# Patient Record
Sex: Female | Born: 1935 | Race: White | Hispanic: No | Smoking: Former smoker
Health system: Southern US, Community
[De-identification: ages and names within clinical notes are randomized; demographics above are authoritative.]

## PROBLEM LIST (undated history)

## (undated) DIAGNOSIS — S065X9A Traumatic subdural hemorrhage with loss of consciousness of unspecified duration, initial encounter: Secondary | ICD-10-CM

## (undated) DIAGNOSIS — R51 Headache: Secondary | ICD-10-CM

## (undated) DIAGNOSIS — R519 Headache, unspecified: Secondary | ICD-10-CM

## (undated) DIAGNOSIS — H269 Unspecified cataract: Secondary | ICD-10-CM

## (undated) DIAGNOSIS — M1990 Unspecified osteoarthritis, unspecified site: Secondary | ICD-10-CM

## (undated) DIAGNOSIS — M5030 Other cervical disc degeneration, unspecified cervical region: Secondary | ICD-10-CM

## (undated) DIAGNOSIS — E785 Hyperlipidemia, unspecified: Secondary | ICD-10-CM

## (undated) DIAGNOSIS — R296 Repeated falls: Secondary | ICD-10-CM

## (undated) DIAGNOSIS — G629 Polyneuropathy, unspecified: Secondary | ICD-10-CM

## (undated) DIAGNOSIS — S065XAA Traumatic subdural hemorrhage with loss of consciousness status unknown, initial encounter: Secondary | ICD-10-CM

## (undated) DIAGNOSIS — I4891 Unspecified atrial fibrillation: Secondary | ICD-10-CM

## (undated) DIAGNOSIS — F329 Major depressive disorder, single episode, unspecified: Secondary | ICD-10-CM

## (undated) DIAGNOSIS — M810 Age-related osteoporosis without current pathological fracture: Secondary | ICD-10-CM

## (undated) DIAGNOSIS — M479 Spondylosis, unspecified: Secondary | ICD-10-CM

## (undated) DIAGNOSIS — K219 Gastro-esophageal reflux disease without esophagitis: Secondary | ICD-10-CM

## (undated) DIAGNOSIS — I10 Essential (primary) hypertension: Secondary | ICD-10-CM

## (undated) DIAGNOSIS — F32A Depression, unspecified: Secondary | ICD-10-CM

## (undated) DIAGNOSIS — K5790 Diverticulosis of intestine, part unspecified, without perforation or abscess without bleeding: Secondary | ICD-10-CM

## (undated) DIAGNOSIS — H409 Unspecified glaucoma: Secondary | ICD-10-CM

## (undated) DIAGNOSIS — W19XXXA Unspecified fall, initial encounter: Secondary | ICD-10-CM

## (undated) HISTORY — DX: Unspecified atrial fibrillation: I48.91

## (undated) HISTORY — DX: Depression, unspecified: F32.A

## (undated) HISTORY — DX: Age-related osteoporosis without current pathological fracture: M81.0

## (undated) HISTORY — DX: Other cervical disc degeneration, unspecified cervical region: M50.30

## (undated) HISTORY — DX: Major depressive disorder, single episode, unspecified: F32.9

## (undated) HISTORY — DX: Unspecified cataract: H26.9

## (undated) HISTORY — PX: APPENDECTOMY: SHX54

## (undated) HISTORY — DX: Unspecified glaucoma: H40.9

## (undated) HISTORY — DX: Diverticulosis of intestine, part unspecified, without perforation or abscess without bleeding: K57.90

## (undated) HISTORY — DX: Spondylosis, unspecified: M47.9

## (undated) HISTORY — DX: Hyperlipidemia, unspecified: E78.5

## (undated) HISTORY — PX: VAGINAL HYSTERECTOMY: SHX2639

## (undated) HISTORY — DX: Unspecified fall, initial encounter: W19.XXXA

## (undated) HISTORY — PX: BURR HOLE FOR SUBDURAL HEMATOMA: SHX1275

## (undated) HISTORY — DX: Unspecified osteoarthritis, unspecified site: M19.90

## (undated) HISTORY — DX: Headache: R51

## (undated) HISTORY — DX: Repeated falls: R29.6

## (undated) HISTORY — DX: Headache, unspecified: R51.9

## (undated) HISTORY — DX: Gastro-esophageal reflux disease without esophagitis: K21.9

## (undated) HISTORY — PX: KNEE SURGERY: SHX244

## (undated) HISTORY — PX: COLONOSCOPY: SHX174

---

## 1998-07-01 ENCOUNTER — Other Ambulatory Visit: Admission: RE | Admit: 1998-07-01 | Discharge: 1998-07-01 | Payer: Self-pay | Admitting: Obstetrics and Gynecology

## 2000-04-05 ENCOUNTER — Inpatient Hospital Stay (HOSPITAL_COMMUNITY): Admission: EM | Admit: 2000-04-05 | Discharge: 2000-04-06 | Payer: Self-pay | Admitting: *Deleted

## 2000-04-05 ENCOUNTER — Encounter: Payer: Self-pay | Admitting: *Deleted

## 2003-05-23 ENCOUNTER — Encounter: Payer: Self-pay | Admitting: Internal Medicine

## 2003-07-11 ENCOUNTER — Encounter: Payer: Self-pay | Admitting: Internal Medicine

## 2005-05-04 ENCOUNTER — Inpatient Hospital Stay (HOSPITAL_COMMUNITY): Admission: RE | Admit: 2005-05-04 | Discharge: 2005-05-07 | Payer: Self-pay | Admitting: Orthopedic Surgery

## 2005-09-15 ENCOUNTER — Ambulatory Visit (HOSPITAL_COMMUNITY): Admission: RE | Admit: 2005-09-15 | Discharge: 2005-09-15 | Payer: Self-pay | Admitting: Neurosurgery

## 2005-11-09 ENCOUNTER — Encounter: Admission: RE | Admit: 2005-11-09 | Discharge: 2005-11-09 | Payer: Self-pay | Admitting: Neurosurgery

## 2005-12-03 ENCOUNTER — Encounter: Admission: RE | Admit: 2005-12-03 | Discharge: 2005-12-03 | Payer: Self-pay | Admitting: Neurosurgery

## 2006-02-25 ENCOUNTER — Encounter: Admission: RE | Admit: 2006-02-25 | Discharge: 2006-02-25 | Payer: Self-pay | Admitting: Neurosurgery

## 2006-05-31 ENCOUNTER — Encounter: Admission: RE | Admit: 2006-05-31 | Discharge: 2006-05-31 | Payer: Self-pay | Admitting: Neurosurgery

## 2006-06-21 ENCOUNTER — Inpatient Hospital Stay (HOSPITAL_COMMUNITY): Admission: RE | Admit: 2006-06-21 | Discharge: 2006-06-24 | Payer: Self-pay | Admitting: Orthopedic Surgery

## 2006-08-27 ENCOUNTER — Encounter: Admission: RE | Admit: 2006-08-27 | Discharge: 2006-08-27 | Payer: Self-pay | Admitting: Neurosurgery

## 2006-11-25 ENCOUNTER — Encounter: Admission: RE | Admit: 2006-11-25 | Discharge: 2006-11-25 | Payer: Self-pay | Admitting: Neurosurgery

## 2007-03-03 ENCOUNTER — Encounter: Admission: RE | Admit: 2007-03-03 | Discharge: 2007-03-03 | Payer: Self-pay | Admitting: Neurosurgery

## 2007-04-21 ENCOUNTER — Encounter: Admission: RE | Admit: 2007-04-21 | Discharge: 2007-04-21 | Payer: Self-pay | Admitting: Neurosurgery

## 2007-06-24 ENCOUNTER — Encounter: Admission: RE | Admit: 2007-06-24 | Discharge: 2007-06-24 | Payer: Self-pay | Admitting: Neurosurgery

## 2007-12-06 ENCOUNTER — Encounter: Admission: RE | Admit: 2007-12-06 | Discharge: 2007-12-06 | Payer: Self-pay | Admitting: Neurosurgery

## 2008-01-09 ENCOUNTER — Encounter: Admission: RE | Admit: 2008-01-09 | Discharge: 2008-01-09 | Payer: Self-pay | Admitting: Neurosurgery

## 2008-01-14 ENCOUNTER — Inpatient Hospital Stay (HOSPITAL_COMMUNITY): Admission: RE | Admit: 2008-01-14 | Discharge: 2008-01-18 | Payer: Self-pay | Admitting: Neurosurgery

## 2008-01-20 HISTORY — PX: BACK SURGERY: SHX140

## 2008-01-25 ENCOUNTER — Encounter: Admission: RE | Admit: 2008-01-25 | Discharge: 2008-01-25 | Payer: Self-pay | Admitting: Neurosurgery

## 2008-03-09 ENCOUNTER — Encounter: Admission: RE | Admit: 2008-03-09 | Discharge: 2008-03-09 | Payer: Self-pay | Admitting: Neurosurgery

## 2008-03-26 ENCOUNTER — Encounter: Admission: RE | Admit: 2008-03-26 | Discharge: 2008-03-26 | Payer: Self-pay | Admitting: Neurosurgery

## 2008-05-01 ENCOUNTER — Encounter: Admission: RE | Admit: 2008-05-01 | Discharge: 2008-05-01 | Payer: Self-pay | Admitting: Neurosurgery

## 2008-06-06 ENCOUNTER — Encounter: Admission: RE | Admit: 2008-06-06 | Discharge: 2008-06-06 | Payer: Self-pay | Admitting: Neurosurgery

## 2008-06-15 ENCOUNTER — Inpatient Hospital Stay (HOSPITAL_COMMUNITY): Admission: RE | Admit: 2008-06-15 | Discharge: 2008-06-17 | Payer: Self-pay | Admitting: Neurosurgery

## 2009-10-23 ENCOUNTER — Encounter: Admission: RE | Admit: 2009-10-23 | Discharge: 2009-10-23 | Payer: Self-pay | Admitting: Internal Medicine

## 2009-10-24 ENCOUNTER — Encounter: Payer: Self-pay | Admitting: Internal Medicine

## 2010-02-18 NOTE — Letter (Signed)
Summary: New Patient letter  [facility]  [address] [place], [place] [address]   Phone: [phone]  Fax: [phone]       10/24/2009 MRN: [id number]  [name] [address] [place], [place]  [address]  Dear [name],  Welcome to the Gastroenterology Division at [facility].    You are scheduled to see Dr.  Marina Goodell on 11-08-09 at 9:30am on the 3rd floor at [facility], [address]  We ask that you try to arrive at our office 15 minutes prior to your appointment time to allow for check-in.  We would like you to complete the enclosed self-administered evaluation form prior to your visit and bring it with you on the day of your appointment.  We will review it with you.  Also, please bring a complete list of all your medications or, if you prefer, bring the medication bottles and we will list them.  Please bring your insurance card so that we may make a copy of it.  If your insurance requires a referral to see a specialist, please bring your referral form from your primary care physician.  Co-payments are due at the time of your visit and may be paid by cash, check or credit card.     Your office visit will consist of a consult with your physician (includes a physical exam), any laboratory testing he/she may order, scheduling of any necessary diagnostic testing (e.g. x-ray, ultrasound, CT-scan), and scheduling of a procedure (e.g. Endoscopy, Colonoscopy) if required.  Please allow enough time on your schedule to allow for any/all of these possibilities.    If you cannot keep your appointment, please call [phone] to cancel or reschedule prior to your appointment date.  This allows [place] the opportunity to schedule an appointment for another patient in need of care.  If you do not cancel or reschedule by 5 p.m. the business day prior to your appointment date, you will be charged a $50.00 late cancellation/no-show fee.    Thank you for choosing  [facility] for your medical needs.  We appreciate the opportunity to care for you.  Please visit [place] at our website  to learn more about our practice.                     Sincerely,                                                             The Gastroenterology Division

## 2010-02-18 NOTE — Procedures (Signed)
Summary: Colonoscopy   Colonoscopy  Procedure date:  07/11/2003  Findings:      Location:  [facility].  Results: Diverticulosis.        Patient Name: [name], [name]. MRN:  Procedure Procedures: Colonoscopy CPT: [phone].  Personnel: Endoscopist: Wilhemina Bonito. Marina Goodell, MD.  Referred By: Pearletha Furl Jacky Kindle, MD.  Exam Location: Exam performed in Outpatient Clinic. Outpatient  Patient Consent: Procedure, Alternatives, Risks and Benefits discussed, consent obtained, from patient. Consent was obtained by the RN.  Indications Symptoms: Hematochezia.  Average Risk Screening Routine.  History  Current Medications: Patient is not currently taking Coumadin.  Pre-Exam Physical: Performed Jul 11, 2003. Entire physical exam was normal.  Exam Exam: Extent of exam reached: Cecum, extent intended: Cecum.  The cecum was identified by appendiceal orifice and IC valve. Patient position: on left side. Colon retroflexion performed. Images taken. ASA Classification: II. Tolerance: excellent.  Monitoring: Pulse and BP monitoring, Oximetry used. Supplemental O2 given.  Colon Prep Used Miralax for colon prep. Prep results: excellent.  Sedation Meds: Patient assessed and found to be appropriate for moderate (conscious) sedation. Fentanyl 75 mcg. given IV. Versed 7 mg. given IV.  Findings NORMAL EXAM: Cecum to Rectum.  - DIVERTICULOSIS: Descending Colon to Sigmoid Colon. ICD9: Diverticulosis, Colon: 562.10.   Assessment Abnormal examination, see findings above.  Diagnoses: 562.10: Diverticulosis, Colon.   Events  Unplanned Interventions: No intervention was required.  Unplanned Events: There were no complications. Plans Disposition: After procedure patient sent to recovery. After recovery patient sent home.  Comments: Return to the care of Dr. Jacky Kindle  This report was created from the original endoscopy report, which was reviewed and signed by the above listed  endoscopist.   cc:  Geoffry Paradise, MD

## 2010-02-20 NOTE — Consult Note (Signed)
Summary: [facility], [facility]   Imported By: Sherian Rein 01/08/2010 13:46:58  _____________________________________________________________________  External Attachment:    Type:   Image     Comment:   External Document

## 2010-04-29 LAB — COMPREHENSIVE METABOLIC PANEL
ALT: 19 U/L (ref 0–35)
BUN: 13 mg/dL (ref 6–23)
Calcium: 9.4 mg/dL (ref 8.4–10.5)
Calcium: 9.4 mg/dL (ref 8.4–10.5)
GFR calc Af Amer: >60 mL/min (ref >60)
Glucose, Bld: 95 mg/dL (ref 70–99)
Glucose, Bld: 99 mg/dL (ref 70–99)
Sodium: 129 mEq/L — ABNORMAL LOW (ref 135–145)
Sodium: 133 mEq/L — ABNORMAL LOW (ref 135–145)
Total Protein: 6.5 g/dL (ref 6.0–8.3)
Total Protein: 6.6 g/dL (ref 6.0–8.3)

## 2010-04-29 LAB — PROTIME-INR: INR: 1 (ref 0.00–1.49)

## 2010-04-29 LAB — URINALYSIS, ROUTINE W REFLEX MICROSCOPIC
Bilirubin Urine: NEGATIVE
Nitrite: NEGATIVE
Specific Gravity, Urine: 1.009 (ref 1.005–1.030)
Urobilinogen, UA: 0.2 mg/dL (ref 0.0–1.0)

## 2010-04-29 LAB — TYPE AND SCREEN: Antibody Screen: NEGATIVE

## 2010-04-29 LAB — DIFFERENTIAL
Lymphocytes Relative: 35 % (ref 12–46)
Lymphs Abs: 1.5 10*3/uL (ref 0.7–4.0)
Monocytes Relative: 9 % (ref 3–12)
Neutro Abs: 2.3 10*3/uL (ref 1.7–7.7)
Neutrophils Relative %: 54 % (ref 43–77)

## 2010-04-29 LAB — CBC
HCT: 38.7 % (ref 36.0–46.0)
Hemoglobin: 13.6 g/dL (ref 12.0–15.0)
MCHC: 35.3 g/dL (ref 30.0–36.0)
RDW: 12.5 % (ref 11.5–15.5)

## 2010-04-29 LAB — APTT: aPTT: 35 seconds (ref 24–37)

## 2010-06-03 NOTE — H&P (Signed)
Cynthia, West NO.:  [id number]   MEDICAL RECORD NO.:  [id number]          PATIENT TYPE:  INP   LOCATION:  3006                         FACILITY:  [facility]   PHYSICIAN:  Payton Doughty, M.D.      DATE OF BIRTH:  20-Feb-1932   DATE OF ADMISSION:  06/15/2008  DATE OF DISCHARGE:                              HISTORY & PHYSICAL   ADMITTING DIAGNOSIS:  Spondylosis at L2-3 and L3-4.   BODY OF TEXT:  A very nice now 77 year old right-handed white lady whom  I have following for few years, has had increasing pain in her back over  the past couple of years.  MR and plain films have been obtained that  shows spondylosis at 4-5 and 5-1, but significant stenosis at 2-3 and 3-  4, the lateral slip and lateral osteophytes at both levels.  She is  admitted now for 2-3, 3-4 anterolateral interbody fusion.   MEDICAL HISTORY:  Remarkable for a bit of hypertension.   She takes Toprol, Lotrel, and Lipitor.  She is also on Naprosyn.   ALLERGIES:  CODEINE and POLYSPORIN.   SURGICAL HISTORY:  Bilateral subdural hematoma done in December and  bilateral knee replacement.   FAMILY HISTORY:  Both parents are deceased and mother had good health.  Father had heart disease.   SOCIAL HISTORY:  She does not smoke and is a social drinker.  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   REVIEW OF SYSTEMS:  Remarkable for contacts, injury to her coccyx,  cataracts, hypertension, hypercholesterolemia, difficult bowel habits  since her knee operation, joint pain, arthritis, anxiety, and excessive  thirst.   HEENT:  Normal limit.  NECK:  She has good range of motion.  CHEST:  Clear.  CARDIAC:  Regular rate and rhythm.  ABDOMEN:  Nontender with no hepatosplenomegaly.  EXTREMITIES:  Without clubbing or cyanosis.  GU:  Deferred.  Peripheral pulse are good.  NEUROLOGIC:  She is awake, alert and oriented.  Her cranial nerves are  intact.  Motor exam shows 5/5 strength throughout the upper and  lower  extremities except for some mild knee extensor weakness on the right  side.  Reflexes are absent in knees and ankles.  Toes are downgoing  bilaterally.  Straight leg is negative.  She has a lot of pain in her  back with forward flexion and it is just below the level of her last  rib.   MR results have been reviewed well.   CLINICAL IMPRESSION:  L2-3 and L3-4 symptomatic spondylosis.   PLAN:  Anterolateral interbody fusion using the XLIF technique at L2-3  and L3-4.  The risks and benefits have been discussed with her, and she  wish to proceed.           ______________________________  Payton Doughty, M.D.     MWR/MEDQ  D:  06/15/2008  T:  06/16/2008  Job:  [id number]

## 2010-06-03 NOTE — Op Note (Signed)
NAMEOLIVINE, HIERS NO.:  [id number]   MEDICAL RECORD NO.:  [id number]          PATIENT TYPE:  INP   LOCATION:  3006                         FACILITY:  [facility]   PHYSICIAN:  Payton Doughty, M.D.      DATE OF BIRTH:  06/28/1932   DATE OF PROCEDURE:  06/15/2008  DATE OF DISCHARGE:                               OPERATIVE REPORT   PREOPERATIVE DIAGNOSIS:  Spondylosis L2-3 and L3-4.   POSTOPERATIVE DIAGNOSIS:  Spondylosis L2-3 and L3-4.   PROCEDURE:  L2-3 and L3-4 anterolateral interbody fusion using XLIF  technique.   SURGEON:  Payton Doughty, MD   ANESTHESIA:  General endotracheal.   PREPARATION:  Prepped and draped with alcohol wipe.   COMPLICATIONS:  None.   NURSE ASSISTANCE:  Bedelia Person, MD   DOCTOR ASSISTANCE:  Cristi Loron, MD.   BODY OF TEXT:  A 77 year old girl with severe spondylosis at L2-3 and L3-  4 and lateral slip these level, taken to the operative room and smoothly  anesthetized and intubated, placed on the right side down left side up  lateral decubitus position and secured in place and prepared for EMG  monitoring.  Following shave, prep, and drape in the usual sterile  fashion, starting at L3-4, a straight lateral incision one slightly  posterior was placed to allow access to the retroperitoneal space.  Retroperitoneal space was accessed and stimulating electrode was  advanced and down the psoas, which the test was positive.  The electrode  was advanced to the psoas muscle using electronic monitoring and EMG  down to the lateral aspect of the L3-4 disk space.  L3-4 disk space  stimulation was done with successive dilators and then with retractor.  Retractor was placed and careful inspection along the posterior,  inferior, and superior blade did not demonstrate any EMG activity.  The  retractor was centered, shim placed in open.  Further testing did not  reveal that there was nerve activity behind the shim, but not adjacent  to it.  The  diskectomy was carried out.  Trial was done and an 8- x 45-  mm implant was placed filled with Osteocel.  Good fit was obtained.  A  lateral plate was placed with 55-mm screws and a 10 mm plate.  Attention  was then turned to the L2-3 interspace where through a separate  incision, similar monitoring was carried out with no evidence of EMG  disturbance.  The retractor was placed and opened the shim place without  difficulty.  Another 8 x 45 mm device was repaired, diskectomy carried  out, lateral release obtained with good straightening of the spine.  The  interbody device was  placed without difficulty.  A second 8-mm plate was placed using 55-mm  screws.  Intraoperative fluoro showed good placement of all devices.  The wound was irrigated, hemostasis assured, and incisions closed with 0  Vicryl, 2-0 Vicryl, and 3-0 nylon.  The patient returned to recovery  room in good condition.           ______________________________  Payton Doughty, M.D.  MWR/MEDQ  D:  06/15/2008  T:  06/16/2008  Job:  [id number]

## 2010-06-03 NOTE — H&P (Signed)
NAMELILIBETH, OPIE NO.:  [id number]   MEDICAL RECORD NO.:  [id number]          PATIENT TYPE:  INP   LOCATION:  3108                         FACILITY:  [facility]   PHYSICIAN:  Payton Doughty, M.D.      DATE OF BIRTH:  July 24, 1932   DATE OF ADMISSION:  01/14/2008  DATE OF DISCHARGE:                              HISTORY & PHYSICAL   ADMITTING DIAGNOSIS:  Right chronic subdural hematoma.   BODY OF TEXT:  This is a very nice 77 year old right-handed white lady  who fell a couple of weeks ago in [place] and hit her head.  She has had  several other falls, had an CT done about a week and half ago that  showed a subdural hematoma, a small amount of acute blood, and very  little shift associated with it.  Followup MR on the January 09, 2008,  demonstrated increased mass effect but no acute blood.  She wanted to be  home for Christmas, so we planned to evacuate the subdural today.  We  repeated her CT this morning and it does in fact showed a slight  increase in the subdural and she is now here for evacuation of subdural  hematoma.   MEDICAL HISTORY:  Remarkable for hypertension.   She is on Lexapro, Zocor, Maxzide, and Toprol.  She has not taken her  Relafen for about a week.   SURGICAL HISTORY:  Knee replacement after a fall.  She also has  coccydynia.   ALLERGIES:  CODEINE and POLYSPORIN.   REVIEW OF SYSTEMS:  Remarkable for back pain, leg pain, and headache.   SOCIAL HISTORY:  She does not smoke.  Drinks on a very limited social  basis and is retired.   FAMILY HISTORY:  Not given.   PHYSICAL EXAMINATION:  HEENT:  Within normal limits.  She does have  black eye on the right.  NECK.  Supple.  No lymphadenopathy.  CHEST:  Clear.  CARDIAC:  Regular rate and rhythm.  ABDOMEN:  Nontender.  No hepatosplenomegaly.  EXTREMITIES:  Without clubbing or cyanosis.  Peripheral pulses are good.  GU:  Peripheral.  NEUROLOGIC:  She is awake, alert, and oriented.  Her cranial  nerves are  intact.  Motor exam shows 5/5 strength throughout the upper and lower  extremities with a mild pronator drift on the left.  She can tandem walk  satisfactorily.  Romberg is negative.   CT results have been reviewed as above and basically show right-sided  subdural, probably 14-15 mm thick, still appears chronic in nature and  has caused about 7-8 mm shift.   CLINICAL IMPRESSION:  Chronic subdural hematomas without resolution.   PLAN:  The plan is for burr hole evacuation.  She understands that if  the craniotomy is required, the operation will be extended to a  craniotomy for evacuation should to be necessary.           ______________________________  Payton Doughty, M.D.     MWR/MEDQ  D:  01/14/2008  T:  01/14/2008  Job:  [id number]

## 2010-06-03 NOTE — Op Note (Signed)
[name], [name] NO.:  [id number]   MEDICAL RECORD NO.:  [id number]          PATIENT TYPE:  INP   LOCATION:  3108                         FACILITY:  [facility]   PHYSICIAN:  Payton Doughty, M.D.      DATE OF BIRTH:  [birth year]   DATE OF PROCEDURE:  01/14/2008  DATE OF DISCHARGE:                               OPERATIVE REPORT   PREOPERATIVE DIAGNOSIS:  Right chronic subdural hematoma.   POSTOPERATIVE DIAGNOSIS:  Right chronic subdural hematoma.   OPERATIVE PROCEDURE:  Bur hole evacuation of subdural hematoma.   SURGEON:  Payton Doughty, MD, Neurosurgery.   ANESTHESIA:  General endotracheal.   PREPARATION:  Prepped and draped with alcohol wipe.   COMPLICATIONS:  None.   NURSE ASSISTANT:  [place].   BODY OF TEXT:  This is a 77 year old lady with chronic right subdural  hematoma.   Taken to the operating room, smoothly anesthetized and intubated, placed  prone on the operating table with the head turned towards the left.  Following shave, prep and drape in the usual sterile fashion, 2 skin  incisions were created, one just above the superior temporal line  straight above the ear and the other one 2 fingerbreadths behind the ear  getting up towards the vertex.  The incisions were about 3 cm long and  through these bur holes were created accessing the dura.  The Bovie was  used to coagulate the dura and immediately underneath it was a membrane  that was opened and subdural hematoma about the consistency and color of  30-week motor oil came out under a fair amount of pressure.  Following  complete evacuation, it was irrigated through both bur holes until it  was clear.  Jackson-Pratt drains were placed in each bur hole and placed  to suction.  They were exited via separate incision.  Successive layers  of 2-0 Vicryl and 3-0 nylon were used to close.  The drains were secured  with 3-0 nylon.  Betadine and Telfa dressing was applied, and the  patient returned to  the recovery room in good condition.           ______________________________  Payton Doughty, M.D.     MWR/MEDQ  D:  01/14/2008  T:  01/14/2008  Job:  [id number]

## 2010-06-03 NOTE — Discharge Summary (Signed)
[name], [name]                ACCOUNT NO.:  [id number]   MEDICAL RECORD NO.:  [id number]          PATIENT TYPE:  INP   LOCATION:  5036                         FACILITY:  [facility]   PHYSICIAN:  Mila Homer. [name], M.D. DATE OF BIRTH:  August 06, 1932   DATE OF ADMISSION:  06/21/2006  DATE OF DISCHARGE:  06/24/2006                               DISCHARGE SUMMARY   ADMISSION DIAGNOSIS:  Osteoarthritis of the right knee.   DISCHARGE DIAGNOSES:  1. Osteoarthritis of the right knee.  2. Post hemorrhagic anemia.  3. Hyperosmolality.  4. Hypertension.   PROCEDURE:  Total knee arthroplasty on the right.   HISTORY:  This 77 year old white female presented to Dr. [name] initially  with a history of bilateral knee pain for quite a while.  She  subsequently underwent a left knee replacement in April 2007 and had  done well with that since that time.  Her right knee has continue to  bother her.  It has bothered her for about 8 years, and the pain comes  on gradually, and has been getting progressively worse.  Pain is now  described as an intermittent aching sensation present without any  walking.  Pain seems to be on the lateral aspect of the joint line  without radiation.  It increases with walking and then decreases with  cortisone injections or Celebrex.  The knee does pop, grind, swell,  gives way at times, but it does not keep her up at night.  She does not  need to ambulate with any assistive devices.  Admitted at this time for  surgical intervention.   HOSPITAL COURSE:  A 77 year old female admitted [name] 2, 2008, after  appropriate laboratory studies were obtained as well as 1 gram Ancef IV  on-call to the operating room.  She was taken to the operating room  where she underwent a right total knee arthroplasty by Dr. Georgena Spurling  and assisted by Jacqualine Code, PA-C.  Tolerated procedure well.  She  was placed on a reduced Dilaudid PCA pump for postop pain management.  Lovenox was used  as an anticoagulant.  OxyContin was begun every 12  hours.  Hip pain was not controlled.  Celebrex 200 mg p.o. b.i.d. was  also started.  A Foley was placed intraoperatively.  Consultations with  PT/OT and care management were made.  CPM 0-90 degrees for 6-8 hours per  day.  Ambulation weightbearing as tolerated with walker.  She did have  hyponatremia and was noted that she was hyperosmolality.  She was  restricted to 1000 ml fluid restrictions daily.  Her IV was then Hep-  Lock.  Weaned to oral pain medicines on postop day #1 and Lovenox  instruction was given.  Blood pressure medicines were held for BP less  than 100 systolic.  She did have a little bit of a sore throat, and this  was corrected with throat lozenges.  [name] was used for home health  RN.  Fluid restrictions were continued to the 4th and 1000 ml/24 hours.  This was increased to 1500 ml/24 hours.  Pit screen was ordered  which  was negative.  She was given 1 unit of packed cells on the 5th.  Typed  and crossed, and this was transfused with a second unit on the 5th also.  Remainder of her hospital course was uneventful, and she was discharged  on the 5th to return back to the office in follow-up.   LABORATORY DATA:  Hemoglobin 13.37, hematocrit 38.7%, white count 5500,  platelets 244,000.  Discharge hemoglobin 7.9, hematocrit 22.7, white  count 4300, platelets were 150,000.  There was no post transfusion  hemoglobin ordered.  Preop sodium 135, potassium 4.8, chloride 102, CO2  29, glucose 90, BUN 17, creatinine 0.66, GFR greater than 60, calcium  was 8.3, total protein 6.5, albumin 4.1, AST 25, ALT 29, ALP 54, total  bili 0.7.  Osmolality of [name] 3, 2008, was 254.  Her sodium dropped to  127 on the 3rd, and this corrected nicely with fluid restrictions.  Discharge sodium was 137, potassium 4.3, chloride 103, CO2 31, glucose  121, BUN 10, creatinine 0.78.  Urinalysis showed rare bacteria, no white  cells, no red cells, rare  epithelial and trace leukocyte esterase.  Blood type was O+, antibody screen negative.  Given 2 units of packed  cells during her hospital course.  No EKG or radiographic studies were  noted at the time of this patient.   DISCHARGE INSTRUCTIONS:  She is restricted to decreased fluid intake for  the next 3 days from discharge.  No driving or lifting for 6 weeks.  Increase activities slowly.  Use of crutches or walker, weightbearing as  tolerated.  May shower on Thursday.  She is to follow blue instruction  sheet.  Change dressing daily and cover with 4x4s.  CPM 0-90 degrees for  6-8 hours per day is ordered.   MEDICATIONS:  1. Lovenox 40 mg inject daily at 8:00 a.m. as instructed.  2. Percocet 5/325 1-2 tablets every 4 hours as needed for pain.  3. Robaxin 500 mg one tablet every 6-8 hours for spasms.  4. Celebrex 200 mg one tablet twice a day for 2 weeks then one tablet      daily with food.  5. Iron sulfate 325 mg b.i.d. with food.   FOLLOWUP:  Follow back up with Dr. [name] on [name] 17, 2008.   CONDITION ON DISCHARGE:  Improved.      [name], P.A.-C.    ______________________________  Mila Homer. [name], M.D.    BDP/MEDQ  D:  08/16/2006  T:  08/16/2006  Job:  [id number]

## 2010-06-03 NOTE — Discharge Summary (Signed)
NAMECOLLYNS, MCQUIGG                ACCOUNT NO.:  [id number]   MEDICAL RECORD NO.:  [id number]          PATIENT TYPE:  INP   LOCATION:  3108                         FACILITY:  [facility]   PHYSICIAN:  Stefani Dama, M.D.  DATE OF BIRTH:  [birth year]   DATE OF ADMISSION:  01/14/2008  DATE OF DISCHARGE:  01/18/2008                               DISCHARGE SUMMARY   ADMITTING DIAGNOSIS:  Right-sided chronic subdural hematoma.   SECONDARY DIAGNOSIS:  Hypertension.   DISCHARGE DIAGNOSIS:  Hypertension.   OPERATIONS AND PROCEDURES:  Right-sided craniotomy for evacuation of  subdural hematoma by Dr. Trey Sailors.   HOSPITAL COURSE:  The patient is a 77 year old white female who had  fallen a few weeks ago in [place] hitting her head, had several other  falls and on CT scan was found to have a significant subdural hematoma  with positive shift.  Arrangements were made for admission to the  hospital on January 14, 2008, for evacuation of subdural hematoma.  She  underwent this procedure, tolerated it well, and was stabilized in the  Neurosurgical ICU.  Postoperatively, she had a Jackson-Pratt drain in  place.  She remained neurologically intact throughout her hospital stay.  She had negative drift postoperatively.  Pupils were equal, round, and  reactive to light and accommodation.  Extraocular movements were intact.  She was moving all extremities.  She was ambulating safely.  She  continued to make progress over the course of the next 3 days and  remained stable and Jackson-Pratt drains were discontinued January 17, 2008, and discharge plans were being arranged.  She was eating well,  voiding well, ambulating safely and negative drift. Vital signs stable,  afebrile.  Ready for discharge home on January 18, 2008.   DISCHARGE CONDITION:  Stable and improved.   DISCHARGE DIAGNOSIS:  Right-sided subdural hematoma status post bur hole  for evacuation of the above hematoma.   DISCHARGE  INSTRUCTIONS:  Discharged home.  Given prescription of  Dilantin, she is to take 100 mg tablets 3 of them nightly.  Avoid  driving.  Avoid alcohol beverages with Ambien and then other her sleep  medications that lower her seizure threshold.  Follow up in 2 weeks with  Dr. Channing Mutters.  She will be to follow up CT scan in the morning of her  followup appointment.  We will arrange this through our office.  Discontinue IV prior to discharge home.  Home Health physical therapy  was set up for proprioception and balance exercises and progressive safe  ambulation.   DISCHARGE MEDICATIONS:  Other than the Dilantin, it will be okay to take  Tylenol PM p.r.n. sleep.  She is to continue on her home medications  which are metoprolol 50 mg 1/2 tablet b.i.d.,  triamterene/hydrochlorothiazide 37.5/25 one p.o. daily, Lexapro 10 mg  p.o. daily, and simvastatin 20 mg p.o. daily.  Discontinue Naprosyn at  this  point in time.  Contact our office prior to followup if there are any  questions or concerns.  Discharge instructions were reviewed with the  patient.  All questions were encouraged and  answered and addressed.  The  patient was seen by Dr. Danielle Dess on the day of discharge.      [name] [facility].      Stefani Dama, M.D.  Electronically Signed    SCI/MEDQ  D:  01/18/2008  T:  01/18/2008  Job:  [id number]

## 2010-06-06 NOTE — Discharge Summary (Signed)
[name], [name] NO.:  [id number]   MEDICAL RECORD NO.:  [id number]          PATIENT TYPE:  INP   LOCATION:  5017                         FACILITY:  [facility]   PHYSICIAN:  Mila Homer. [name], M.D. DATE OF BIRTH:  1932/10/17   DATE OF ADMISSION:  05/04/2005  DATE OF DISCHARGE:  05/07/2005                                 DISCHARGE SUMMARY   ADMISSION DIAGNOSES:  1.  End-stage osteoarthritis, bilateral knees, left more symptomatic than      right.  2.  Hypertension.  3.  Hypercholesterolemia.  4.  History of palpitations secondary to caffeine intake.   DISCHARGE DIAGNOSES:  1.  Status post left total knee arthroplasty.  2.  Acute blood loss anemia secondary to surgery requiring blood      transfusion.  3.  Hypokalemia, treated.  4.  Hyponatremia, resolved.   HISTORY OF PRESENT ILLNESS:  Ms. Bembenek is a 77 year old white female with  a history of bilateral knee pain.  The patient has had right knee pain for  approximately 7 years.  Her left knee, however, at this point in time is  much more symptomatic and been bothering her for at least 1 year.  No known  injury to the left knee.  The patient has undergone no surgical procedures  on the left knee.  Left knee pain is becoming progressively worse.  The pain  is described as a constant, sharp sensation over the lateral joint line  without radiation.  The pain increases with ambulation up and down steps,  walking, and decreases with hot pads.  The patient takes naproxen for the  pain that provides a fair amount of relief.  Mechanical symptoms positive  for catching and giving away.  She does have waking pain.  No assistive  devices used to ambulate.  The patient has had a cortisone injection in the  left hand, which provided her with fair relief of pain.   ALLERGIES:  CODEINE causes nausea and vomiting.  POLYSPORIN causes a rash.   MEDICATIONS:  1.  Toprol 50 mg one tablet p.o. b.i.d.  2.  Lipitor 10 mg one  p.o. q.a.m.  3.  Naproxen 500 mg one tablet p.o. b.i.d., last dose April 21, 2005.  4.  Doxepin 25 mg two tablets p.o. q.h.s.  5.  Aspirin 81 mg one tablet p.o. q.a.m.  Last dose on April 21, 2005.  6.  Os-Cal 600 mg one tablet p.o. b.i.d.  7.  Fish oil 1200 mg p.o. b.i.d.  8.  Magnesium one tablet p.o. q.a.m.  9.  Vitamin B12 one tablet p.o. q.a.m.  10. Osteo-Biflex one tablet p.o. b.i.d.  11. Biotin tablet one tablet p.o. q.a.m.  12. Multivitamin one tablet p.o. q.a.m.  13. Vitamin C 1000 mg p.o. b.i.d.   SURGICAL PROCEDURE:  The patient was taken to the operating room on May 04, 2005, by Mila Homer. [name], M.D., assisted by Richardean Canal, P.A.-C.  The  patient was placed under general anesthesia with a supplemental femoral  nerve block and underwent a left total knee arthroplasty using the following  components:  A size D femoral component, a size 3 weighted tibial component,  an all-poly size 32 8.5 mm thickness patella and a 10 mm insert.  The  patient tolerated the procedure well and returned to recovery in good and  stable condition.   CONSULTATIONS:  The following consults were obtained while the patient was  hospitalized:  PT/OT and case management.   HOSPITAL COURSE:  Postop day #1, the patient had some nausea, pain under  adequate control, no chest pain, no shortness of breath, no calf pain.  The  patient afebrile, vital signs stable.  H&H 9.3 and 27.0.  White count is  6800.  Postop day #2, the patient was found to be anemic with an H&H of 7.9  and 22.9, therefore was transfused with 2 units of packed red blood cells.  At rounds the patient was up in the chair and felt very weak.  No chest  pain, no shortness of breath, no calf pain.  Pain controlled with minimal  medications.  The patient was found to be hyponatremic and with a sodium of  128.  Next, postop day #3, the patient afebrile, vital signs stable.  The  patient's sodium was 139.  However, the patient was found  to be hypokalemic  and potassium was replaced.  H&H 10.9 and 30.9 status post 2 units of packed  red blood cells.  Pain under adequate control.  The patient afebrile, vital  signs stable.  The patient was working with physical therapy and remained  stable and progressed well with PT and was to be discharged later that day  to home.   LABORATORY DATA:  Routine labs on admission:  CBC:  All values within normal  limits.  Coagulation studies:  All values within normal limits.  Routine  chemistries on admission:  Sodium 134, low, potassium 4.2, chloride 98,  bicarb was 29, glucose 95, BUN 13, creatinine 0.9.  Hepatic enzymes:  All  values within normal limits.  Urinalysis was negative on admission.   DISCHARGE MEDICATIONS:  The patient is to resume preop medications except  for aspirin while on Lovenox.  Add the following:   1.  Lovenox 40 mg one subcutaneous injection in a.m., last dose on May 18, 2005.  2.  OxyContin 10 mg sustained release one tablet every 12 hours as needed      for pain.  3.  Percocet 5/325 mg one to two tablets every 4-6 hours for pain.  4.  Skelaxin 800 mg one tablet every 6 hours for spasm.   DIET:  No restrictions.   ACTIVITY:  The patient is weightbearing as tolerated on the left leg with  walker.   WOUND CARE:  The patient is to keep wound clean and dry, change dressing  daily, call if any signs of infection.   FOLLOW-UP:  1.  The patient needs a follow-up appointment with Dr. [name] on Tuesday,      April __________ 2007.  The patient is to call the office at [phone]      for appointment.  2.  Home health PT per [facility].   SPECIAL INSTRUCTIONS:  CPM 0-90 degrees 6-8 hours a day, increase by 10  degrees daily.   CONDITION ON DISCHARGE:  The patient was discharged to home in good, stable  condition.      Richardean Canal, P.A.    ______________________________  Mila Homer. [name], M.D.   GC/MEDQ  D:  06/26/2005  T:  06/27/2005  Job:   [id number]

## 2010-06-06 NOTE — H&P (Signed)
[name], KITCHENS                ACCOUNT NO.:  [id number]   MEDICAL RECORD NO.:  [id number]            PATIENT TYPE:   LOCATION:                                 FACILITY:   PHYSICIAN:  Mila Homer. [name], M.D. DATE OF BIRTH:  1932/12/20   DATE OF ADMISSION:  05/04/2005  DATE OF DISCHARGE:                                HISTORY & PHYSICAL   CHIEF COMPLAINT:  Left knee pain for the last year.   HISTORY OF PRESENT ILLNESS:  This 77 year old white female presented to Dr.  [name] with a history of bilateral knee pain.  Her right knee has bothered  her for about 7 years and she has had multiple cortisone shots in it but her  left knee started bothering her a year ago and seems to be much more  problematic.  She has had no specific injuries to the knee other than some  cracking in the knees at certain exercise classes and she has had no OR on  the knees.  Currently her left knee is bothering her more than the right.   At this point, the left knee pain came on suddenly and has been getting  progressively worse.  It is kind of a constant sharp sensation over the  lateral joint line without radiation.  The pain increases with going up or  down steps and walking and then decreases with a hot bath.  She has been  taking Naprosyn for pain and that provides a fair amount of relief.  She  does complain of the knee popping, catching, grinding, swelling and giving  way at times.  It also keeps her up at night.  She does not ambulate with  any assistive devices.  She has recently received a cortisone injection in  that knee and that has provided her a fair amount of relief.   ALLERGIES:  1.  CODEINE causes severe nausea and vomiting.  2.  POLYSPORIN causes a rash.   CURRENT MEDICATIONS:  1.  Metoprolol 50 mg 1 tablet p.o. b.i.d.  2.  Lipitor 10 mg 1 tablet p.o. q.a.m.  3.  Naprosyn 500 mg 1 tablet p.o. b.i.d. last dose April 21, 2005.  4.  Doxepin 25 mg 2 tablets p.o. q.h.s.  5.  Aspirin 81 mg 1  tablet p.o. q.a.m. last dose April 21, 2005.  6.  Os-Cal 600 mg 1 tablet p.o. b.i.d.  7.  Fish oil 1200 mg 1 tablet p.o. b.i.d.  8.  Magnesium 1 tablet p.o. q.a.m.  9.  Vitamin B12 1 tablet p.o. q.a.m.  10. Osteo Bi-Flex 1 tablet p.o. b.i.d.  11. Biotin 1 tablet p.o. q.a.m.  12. Multivitamin 1 tablet p.o. q.a.m.  13. Vitamin C 1000 mg 1 tablet p.o. b.i.d.   PAST MEDICAL HISTORY:  1.  Hypertension.  2.  Hypercholesterolemia.  3.  History of palpitations due to excess caffeine intake in 2001.   She denies any history of diabetes mellitus, asthma, reflux, peptic ulcer  disease, heart disease or any other chronic medical condition other than  noted previously.   PAST SURGICAL HISTORY:  1.  Tonsillectomy in 1940s.  2.  Hysterectomy 1979.  3.  Bilateral blepharoplasty in the 1970s.  4.  Face lift in the 1990s.  5.  Natural childbirth in 60, 89, and 10.   The only complication she has had from surgery is severe nausea and vomiting  after receiving codeine after childbirth.   SOCIAL HISTORY:  She denies any history of cigarette smoking or drug use.  She does drink one to three glasses of wine very seldomly. She is married  and lives with her husband in a two-story house.  Her husband is 11 years  older than her.  Her medical doctor is Dr. Geoffry Paradise and her  cardiologist is Dr. Roger Shelter.   FAMILY HISTORY:  Father has a history of heart disease and hypertension.  Sister has a history of hypertension and breast cancer and her grandparents  did have a history of cancer.   REVIEW OF SYSTEMS:  She does wear contact lenses.  She has a history of the  high blood pressure and the palpitations as previously mentioned.  She has  had a bladder infection in the past and she has frequent urination and  nocturia due to the large amount of water she takes in.  She has had some  weight gain since she started on blood pressure medicine.  All other systems  are negative and  noncontributory.   PHYSICAL EXAM:  GENERAL:  A well-developed, well-nourished white female in  no acute distress.  Talks easily with examiner.  Mood and affect are  appropriate.  No limp. Height 5 feet 9-1/2 inches, weight 160 pounds, BMI is  22.5.  VITAL SIGNS:  Temperature 98.4 degrees Fahrenheit, pulse 64, respirations  69, BP 138/74.  HEENT:  Normocephalic, atraumatic without frontal or maxillary sinus  tenderness to palpation.  Conjunctiva pink.  Sclerae anicteric.  PERRLA.  EOMs intact.  No visible external ear deformities.  Hearing grossly intact.  Tympanic membranes pearly gray bilaterally with good light reflex.  Nose and  nasal septum midline.  Nasal mucosa pink and moist without exudates or  polyps noted.  Buccal mucosa pink and moist.  Dentition in good repair.  Pharynx without erythema or exudates.  Tongue and uvula midline.  Tongue  without fasciculations and uvula rises equally with phonation.  NECK:  No visible masses or lesions noted.  Trachea midline.  No palpable  lymphadenopathy nor thyromegaly.  Carotids +2 bilaterally without bruits.  Full range of motion, nontender to palpation along the cervical spine.  CARDIOVASCULAR:  Heart rate and rhythm regular.  S1, S2 present with a grade  2/6 systolic murmur heard best at the right sternal border second  intercostal space.  RESPIRATORY:  Respirations even and unlabored.  Breath sounds clear to  auscultation bilaterally without rales or wheezes noted.  ABDOMEN:  Rounded abdominal contour.  Bowel sounds present x4 quadrants.  Soft, nontender to palpation without hepatosplenomegaly nor CVA tenderness.  Femoral pulses +2 bilaterally.  Nontender to palpation along the vertebral  column.  BREASTS/GU/RECTAL/PELVIC:  These exams deferred at this time.  MUSCULOSKELETAL:  No obvious deformities bilateral upper extremities with  full range of motion to these extremities without pain.  Radial pulses +2 bilaterally.  Full range of  motion of her hips, ankles and toes bilaterally.  DP and PT pulses are +2.  No calf pain with palpation.  Negative Homans'  sign bilaterally.   Right knee skin intact, no erythema or ecchymosis.  She is lacking maybe 5  degrees of full extension but has flexion to about 110 degrees with minimal  patellofemoral crepitus.  There is no pain with palpation along the joint  line at this time, no effusion.  She is stable to varus and valgus stress,  negative anterior drawer.  Left knee skin is intact.  No erythema or  ecchymosis.  She has full extension and flexion to about 115 degrees with a  moderate amount of kind of gross patellar crepitus.  No real pain with  palpation along the joint line.  No effusion.  Stable to varus and valgus  stress.  Negative anterior drawer.  NEUROLOGIC:  Alert and oriented x3.  Cranial nerves II-XII are grossly  intact.  Strength 5/5 bilateral upper and lower extremities.  Rapid  alternating movements intact.  Deep tendon reflexes 2+ bilateral upper and  lower extremities.  Sensation intact to light touch.   RADIOLOGIC FINDINGS:  X-rays taken of both knees in February 2007 show  complete bone on bone collapse and a varus alignment of the right knee and  almost complete collapse in the left.   IMPRESSION:  1.  End-stage osteoarthritis bilateral knees, left more symptomatic than      right.  2.  Hypertension.  3.  Hypercholesterolemia.  4.  History of palpitations secondary to caffeine intake.   PLAN:  Ms. Ruedas will be admitted to [facility] on May 04, 2005  where she will undergo a left total knee arthroplasty by Dr. Mila Homer.  Lucey.  She will undergo all the routine preoperative laboratory tests and  studies prior to this procedure.  If she has any medical issues while she is  hospitalized, we will consult Dr. Jacky Kindle. Both Dr. Jacky Kindle and Dr. Deborah Chalk  have provided clearance for her surgery.      Legrand Pitts Duffy, P.A.     ______________________________  Mila Homer. [name], M.D.    KED/MEDQ  D:  04/21/2005  T:  04/21/2005  Job:  [id number]

## 2010-06-06 NOTE — Discharge Summary (Signed)
[place]  Patient:    [name], [name]                         MRN: [id number] Adm. Date:  [id number] Disc. Date: 04/06/00 Attending:  Eleanora Neighbor Dictator:   Jennet Maduro. Earl Gala, R.N., A.N.P. CC:         Richard A. Jacky Kindle, M.D.   Discharge Summary  DISCHARGE DIAGNOSES: 1. Chest pain in the setting of palpitations with negative cardiac enzymes. 2. Hypertension currently initiated on beta blocker therapy. 3. Hypercholesterolemia with current lipid panel showing total cholesterol of    218, triglycerides 116, HDL 50, and LDL 145.  HISTORY OF PRESENT ILLNESS:  [name] is a 77 year old white female who basically has had no cardiac history except for mild hypertension.  She has not been on any medicines.  She has been monitoring her blood pressure over the past several weeks and has noted it to be somewhat elevated.  She notes that she had the onset of palpitations at approximately 9:20 a.m. on the day of admission while she was walking as well as drinking some water.  She noted that she "felt weird."  She began having diffuse episodes of chest pain with radiation into the back, arms, and up into the neck.  Her blood pressure was quite elevated at 194/105 at home.  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  Then she proceeded on to Dr. Patty Sermons office.  There her blood pressure was 200/125. She was given nitroglycerin x 1 with systolic blood pressure down to 60.  She did have considerable slowing on her 12-leak electrocardiography most likely vagal in nature.  She was subsequently referred by EMS to the emergency room for further evaluation.  In the emergency room she was pain-free.  Please see the dictated history and physical for further patient presentation and profile.  LABORATORY DATA:  Hematocrit 37, sodium 139, potassium 4.0, chloride 105, BUN 11, creatinine 0.7, glucose  90.  EKG showing sinus bradycardia with a first degree AV block.  Chest x-ray showed atelectasis in the right base.  It was a portable film.  Cardiac enzymes showed two negative troponins as well as two negative CK-MBs.  Urinalysis was unremarkable.  A lipid panel profile is as stated above.  TSH is normal at 1.370 with a normal T4 of 8.9.  HOSPITAL COURSE:  The patient was admitted for overnight observation.  She ruled out negative for myocardial infarction per serial enzymes.  She was placed on low dose beta blocker therapy as well as aspirin.  On the following morning of April 06, 2000, she has done well.  She has had no recurrence of chest pain and no palpitations.  She is tolerating her medicines well.  Blood pressure is 150/80.  Her physical is unremarkable and plans will now be made for probable discharge today following third CK-MB with plans for outpatient stress testing.  CONDITION ON DISCHARGE:  Stable.  DISCHARGE MEDICATIONS: 1. Aspirin daily. 2. Lopressor 50 mg 1/2 tablet b.i.d. 3. Nitroglycerin on p.r.n. basis.  She is to be lying down if she needs to    take this.  ACTIVITY:  Is to be light until seen back in follow-up.  DIET:  Low fat and low cholesterol.  FOLLOW-UP:  Appointment will be arranged by our office later on today to arrange stress Cardiolite. DD:  04/06/00  TD:  04/06/00 Job: [id number]

## 2010-06-06 NOTE — Op Note (Signed)
[name], [name] NO.:  [id number]   MEDICAL RECORD NO.:  [id number]          PATIENT TYPE:  INP   LOCATION:  2899                         FACILITY:  [facility]   PHYSICIAN:  Mila Homer. [name], M.D. DATE OF BIRTH:  November 19, 1932   DATE OF PROCEDURE:  06/21/2006  DATE OF DISCHARGE:                               OPERATIVE REPORT   PREOPERATIVE DIAGNOSIS:  Right knee arthritis.   POSTOPERATIVE DIAGNOSIS:  Right knee arthritis.   PROCEDURE:  Right total knee arthroplasty.   INDICATIONS FOR PROCEDURE:  The patient is a 77 year old white female  with failure to conservative measures for osteoarthritis of the right  knee.  Left knee had been done a couple of years ago and she was very  happy with that.  Informed consent was obtained.   DESCRIPTION OF PROCEDURE:  The patient was laid supine and administered  general anesthesia.  Foley catheter placed.  Right leg was prepped and  draped in usual sterile fashion.  After sterile prep and drape, the  extremity was exsanguinated with the Esmarch and tourniquet inflated 350  mmHg.  I then used a #10 blade to make a midline incision.  With a fresh  #10 blade I made a medial parapatellar arthrotomy and performed  synovectomy.  I then elevated the deep MCL off the medial crest the  tibia.  I then everted the patella measured 22 mm thick.  I reamed down  9 mm, drilled three lug holes through the 32 mm template and with the  trial in place, recreated to 21 mm thickness.  I then removed the  template and went into flexion.  I cut ACL, PCL and used the  extramedullary alignment system to make a perpendicular cut to the  anatomic axis of tibia.  I removed the cut surface as well as the guide.  I made an intramedullary drill hole in the femur with the large drill.  I then used the intramedullary guide set on 6 degrees valgus cut, pinned  the distal femoral cutting block into place and made the distal femoral  cut with a sagittal saw.   I then marked out the epicondylar axis,  posterior condylar angle measured 5 degrees.  I then sized to a size E  pinned through the 5 degree external rotation holes.  I then made the  anterior, posterior and chamfer cuts with a sagittal saw.  I then placed  a lamina spreader in the knee, removed cut surface of the bone, resected  the ACL, PCL, medial and lateral menisci and posterior condylar  osteophytes.  At this point, I placed a 10 mm spacer block in the knee,  had good flexion/extension balance.  I then cut for a high flexed design  and trialed and I finished the tibia with a size 4 tibial tray, drill  and  keel.  I then trialed with a size 4 tibia, size E gender specific  high flexed femur component, 10 mm insert and a 32 patella.  Flexion/extension gap balance was excellent.  The patellar tracking was  excellent dropping angles back to 145  degrees.  I then removed trial  components, copiously irrigated and then cemented in the components,  removing excess cement.  I let the cement harden in extension.  Then let  the tourniquet and obtained hemostasis.  I left a Hemovac coming out  superolateral and deep to the arthrotomy, pain catheter coming out  supermedial and superficial to the arthrotomy.  I then closed the  arthrotomy with figure-of-eight  #1 Vicryl sutures, deep soft tissues  with buried 0 Vicryl sutures and subcuticular 2-0 Vicryl stitch and skin  staples.   DRAINS:  One Hemovac and one pain catheter.   ESTIMATED BLOOD LOSS:  300 mL.   TOURNIQUET TIME:  48 minutes.           ______________________________  Mila Homer [name], M.D.     SDL/MEDQ  D:  06/21/2006  T:  06/21/2006  Job:  [id number]

## 2010-06-06 NOTE — Op Note (Signed)
NAMEJUMANAH, HYNSON NO.:  [id number]   MEDICAL RECORD NO.:  [id number]          PATIENT TYPE:  INP   LOCATION:  2899                         FACILITY:  [facility]   PHYSICIAN:  Mila Homer. [name], M.D. DATE OF BIRTH:  1932-12-19   DATE OF PROCEDURE:  05/04/2005  DATE OF DISCHARGE:                                 OPERATIVE REPORT   PREOPERATIVE DIAGNOSIS:  Left knee osteoarthritis.   POSTOPERATIVE DIAGNOSIS:  Left knee osteoarthritis.   OPERATION PERFORMED:  Left total knee arthroplasty.   SURGEON:  Mila Homer. [name], M.D.   ASSISTANT:  Richardean Canal, P.A.   ANESTHESIA:  General.   INDICATIONS FOR PROCEDURE:  The patient is a 77 year old white female with  failure of conservative measures for osteoarthritis of the knee.  Informed  consent was obtained.   DESCRIPTION OF PROCEDURE:  The patient was laid supine and administered  general anesthesia, laid in supine position, left leg was prepped and draped  in the usual sterile fashion.  A midline incision was made after  exsanguination of the extremity and elevation of the tourniquet.  This was  done with a #10 blade.  I then made a medial parapatellar arthrotomy and  performed synovectomy.  I then elevated the deep MCL off the medial crest of  the tibia.  I then used the extramedullary alignment system to make a  perpendicular cut to the anatomic axis of the tibia removing 2 mm of bone  off the medial side which was the low side which was the low side.  I then  removed that guide and placed an intramedullary guide into the femur after  drilling to gain access.  I then placed the distal femoral cutting block  into place, pinned it into placed with a 6 degree valgus cut and made that  cut.  At this point I removed the guide and drilled out the epicondylar  axis.  The posterior condylar angle measured 3 degrees.  Sized to a size D,  pinned to the 3 degree external rotation holes and made the anterior,  posterior and  chamfer cuts with the 4 in 1 cutting block.  I then placed the  lamina spreader in the knee, removed the ACL, PCL, medial and lateral  menisci, posterior condylar osteophytes.  I then finished the femur with a  size D finishing block drilling the lugs and cutting with the blocks.  I  finished the tibia with a size 4 tibial tray drill and keel.  I then trialed  with a size 4 tibia, size D femur, size 10 insert and 32 patella.  Flexion  and extension gap balance was excellent.  Patella tracking was excellent.  I  then removed the trial components and copiously irrigated.  I then cemented  in the tibia and femur, removed excess cement, snapped in the real  polyethylene, put the knee in extension.  I cemented in the patella and  removed excess cement as well.  I then irrigated again and closed with  interrupted figure-of-eight #1 Vicryl sutures in the arthrotomy, 0s in the  deep soft  tissues, running 2-0 subcuticular layer and skin staples.  I then  dressed with Xeroform dressing sponges, sterile Webril and TED stocking.  I  left a Hemovac deep to the arthrotomy, a pain catheter superficial to the  arthrotomy.   ESTIMATED BLOOD LOSS:  300 mL.   TOURNIQUET TIME:  46 minutes.           ______________________________  Mila Homer [name], M.D.     SDL/MEDQ  D:  05/04/2005  T:  05/04/2005  Job:  [id number]

## 2010-06-06 NOTE — H&P (Signed)
NAMEDIVINITY, Cynthia                ACCOUNT NO.:  [id number]   MEDICAL RECORD NO.:  [id number]          PATIENT TYPE:  INP   LOCATION:  NA                           FACILITY:  [facility]   PHYSICIAN:  Legrand Pitts. Duffy, P.A.   DATE OF BIRTH:  1932-09-08   DATE OF ADMISSION:  06/21/2006  DATE OF DISCHARGE:                              HISTORY & PHYSICAL   CHIEF COMPLAINT:  Right knee pain for the last 8 years.   HISTORY OF PRESENT ILLNESS:  This 77 year old white female patient  presented to Dr. [name] initially with a history of bilateral knee pain  for quite a while.  She subsequently underwent a left knee replacement  by Dr. [name] in April of 2007 and had done well with that since that  time.  Her right knee has continued to bother her.  It bothers her  probably for about 8 years, and the pain came on gradually and has been  getting progressively worse.  Pain is now described as an intermittent  aching sensation present with any walking.  The pain seems be on the  lateral aspect of the joint line without radiation.  It increases with  walking and then decreases with a cortisone shot or Celebrex.  The knee  does pop, grind, swell, gives way at times, but it does not keep her up  at night.  She does not need ambulate with any assistive devices.   ALLERGIES:  1. CODEINE causes severe nausea and vomiting.  2. POLYSPORIN causes a rash.   CURRENT MEDICATIONS:  1. Metoprolol 50 mg, half a tablet p.o. b.i.d..  2. Exforge 10/160 mg, half a tablet p.o. q.a.m.  3. Aspirin 81 mg, one tablet p.o. q.a.m.; last dose Jun 08, 2006.  4. Lexapro 10 mg, one tablet p.o. q.a.m..  5. Zocor 20 mg, one tablet p.o. q.a.m.  6. Celebrex 200 mg, one tablet p.o. q.a.m.  7. Doxepin 25 mg, three tablets p.o. at bedtime, or she uses next.  8. Ambien 10 mg, one tablet p.o. bedtime p.r.n.  9. Os-Cal 600 mg, one tablet p.o. b.i.d.  10.Fish oil 1200 mg, one tablet p.o. b.i.d.  11.Magnesium one tablet p.o. q.a.m.  12.Vitamin B12 1 tablet p.o. q.a.m.  13.Osteo Bi-Flex one tablet p.o. b.i.d.  14.Biotin one tablet p.o. q.a.m.  15.Multivitamin one tablet p.o. q.a.m.  16.Vitamin C 1000 mg, one tablet p.o. b.i.d.  17.CoQ10 200 mg p.o. q.a.m.  18.Vitamin B6 100 mg p.o. q.a.m.   PAST MEDICAL HISTORY:  1. Hypertension.  2. Hypercholesterolemia.  3. History of palpitations due to excess caffeine intake in 2001.  4. Degenerative disk disease, lumbar spine, with a history of a      herniated disk, treated by Dr. Micah Flesher.  5. She denies any history of diabetes mellitus, asthma, reflux, peptic      ulcer disease, heart disease or any other chronic medical condition      other than noted previously.   PAST SURGICAL HISTORY:  1. Tonsillectomy in the 1940's.  2. Hysterectomy in 1979.  3. Bilateral blepharoplasty in the 1970's.  4.  Face lift in the 1990's.  5. Natural childbirth, 1960, 1962 and 1965.  6. Left total knee arthroplasty, May 04, 2005, by Dr. Raymon Mutton.  The only complication other than pain that she has had from      surgery is severe nausea and vomiting after receiving CODEINE after      childbirth.   SOCIAL HISTORY:  She denies any history of cigarette smoking or drug  use.  She drinks one to three glasses of wine seldom.  She is married  and lives with her husband in a two-story house.  Medical doctor is Dr.  Geoffry Paradise. Cardiologist is Dr. Roger Shelter, and she has three  girls, age 12, 32 and 59.   FAMILY HISTORY:  Father with a history of heart disease and  hypertension.  Sister with a history of hypertension, breast cancer, and  her grandparents did have a history of cancer.   REVIEW OF SYSTEMS:  She does wear contact lenses.  She has a history of  hypertension and palpitations.  Has had bladder infections in the past  with frequent urination and nocturia due to the large amount of water  she takes in.  She has had some weight gain since she started blood   pressure medicines.  She has a history of head injury at age 12.  She  does have a history of migraines which have disappeared in the last few  years.  She does having an ingrown great toenail on the left foot.  All  other systems are negative and noncontributory.   PHYSICAL EXAMINATION:  GENERAL:  Well-developed, well-nourished white  female in no acute distress.  Talks easily with examiner.  Mood and  affect are appropriate.  Walks with a normal gait.  Height 5 feet 8-1/2  inches, weight 155 pounds, BMI is 22-1/2.  VITAL SIGNS:  Temperature 98.6 degrees Fahrenheit, pulse 56,  respirations 14 and BP 128/68.  HEENT:  Normocephalic, atraumatic without frontal or maxillary sinus  tenderness to palpation.  Conjunctivae pink.  Sclerae anicteric.  PERLA.  EOM's intact.  No visible external ear deformities.  Hearing grossly  intact.  Tympanic membranes pearly gray bilaterally with good light  reflex.  Nose: The nasal septum midline.  Nasal mucosa pink and moist  without exudates or polyps noted.  Buccal mucosa pink and moist.  Dentition in good repair.  Pharynx without erythema or exudates.  Tongue  and uvula midline.  Tongue without fasciculations, and uvula rises  equally with phonation.  NECK:  No visible masses or lesions noted.  Trachea midline.  No  palpable lymphadenopathy nor thyromegaly.  Carotids +2 bilaterally  without bruits.  Full range of motion.  Nontender to palpation along the  cervical spine.  CARDIOVASCULAR:  Heart rate and rhythm regular.  S1-S2 present without  rubs, clicks or murmurs noted.  RESPIRATORY:  Respirations even and unlabored.  Breath sounds clear to  auscultation bilaterally without rales or wheezes noted.  ABDOMEN:  Rounded abdominal contour.  Bowel sounds present x 4  quadrants.  Soft, nontender to palpation without hepatosplenomegaly.  BACK:  No CVA tenderness.  Nontender to palpation along the vertebral  column. EXTREMITIES: Femoral pulses +2  bilaterally.  Femoral pulses are +2  bilaterally.  BREASTS/GENITALIA/RECTAL/PELVIC:  These exams are deferred at this time.  MUSCULOSKELETAL:  No obvious deformities of bilateral upper extremities  with full range of motion of these extremities without pain.  Radial  pulses +2 bilaterally.  She has full range of motion of her hips, ankles  and toes bilaterally.  DP and PT pulses are +2.  No calf pain with  palpation.  Negative Homans' sign bilaterally.  Left knee has a well-  healed midline incision.  Full extension and flexion to 125 degrees  without pain or crepitus.  There is no pain with palpation along the  joint line.  No effusion.  She is stable to varus and valgus stress.  Negative anterior drawer.  Right knee skin is intact.  No erythema or  ecchymosis.  She has full extension and flexion to 108 degrees with  moderate crepitus with range of motion of the knee.  She does have pain  with palpation over the lateral joint line.  No effusion.  Stable to  varus and valgus stress.  Negative anterior drawer.  NEUROLOGICAL:  Alert and oriented x3.  Cranial nerves II-XII are grossly  intact.  Strength 5/5 in bilateral upper and lower extremities.  Rapid  alternating movements intact.  Deep tendon reflexes 2+ bilaterally,  upper and lower extremities.  Sensation intact to light touch.  Rapid  alternating movements intact.   RADIOLOGIC FINDINGS:  X-rays taken of her right knee in February of 2007  show complete bone-on-bone collapse in a varus alignment of the right  knee.   IMPRESSION:  1. End-stage osteoarthritis, bilateral knees, status post left knee      replacement in April of 2007.  2. Hypertension.  3. Hypercholesterolemia.  4. History of palpitations due to caffeine intake.   PLAN:  Ms. Fakhouri will be admitted to [facility] on Maryela 2,  2008 where she will undergo a right total knee arthroplasty by Dr.  Raymon Mutton.  She will undergo all the routine laboratory  tests and  procedures and studies prior to this procedure.  If she has any medical  and issues while she is hospitalized we will consult Dr. Jacky Kindle or Dr.  Deborah Chalk.      Legrand Pitts Duffy, P.A.     KED/MEDQ  D:  06/21/2006  T:  06/21/2006  Job:  [id number]

## 2010-10-23 LAB — CBC
HCT: 41.6 % (ref 36.0–46.0)
Hemoglobin: 13.8 g/dL (ref 12.0–15.0)
MCV: 93.1 fL (ref 78.0–100.0)
Platelets: 423 10*3/uL — ABNORMAL HIGH (ref 150–400)
WBC: 5 10*3/uL (ref 4.0–10.5)

## 2010-10-23 LAB — URINALYSIS, ROUTINE W REFLEX MICROSCOPIC
Bilirubin Urine: NEGATIVE
Ketones, ur: NEGATIVE mg/dL
Nitrite: NEGATIVE
Protein, ur: NEGATIVE mg/dL

## 2010-10-23 LAB — URINE MICROSCOPIC-ADD ON

## 2010-10-23 LAB — COMPREHENSIVE METABOLIC PANEL
Albumin: 4.2 g/dL (ref 3.5–5.2)
BUN: 15 mg/dL (ref 6–23)
CO2: 29 mEq/L (ref 19–32)
Chloride: 96 mEq/L (ref 96–112)
Creatinine, Ser: 0.74 mg/dL (ref 0.4–1.2)
GFR calc non Af Amer: >60 mL/min (ref >60)
Glucose, Bld: 93 mg/dL (ref 70–99)
Total Bilirubin: 1 mg/dL (ref 0.3–1.2)

## 2010-10-23 LAB — PROTIME-INR
INR: 0.9 (ref 0.00–1.49)
Prothrombin Time: 12.1 seconds (ref 11.6–15.2)

## 2010-10-23 LAB — DIFFERENTIAL
Basophils Absolute: 0 10*3/uL (ref 0.0–0.1)
Basophils Relative: 1 % (ref 0–1)
Lymphocytes Relative: 26 % (ref 12–46)
Neutro Abs: 3.2 10*3/uL (ref 1.7–7.7)
Neutrophils Relative %: 64 % (ref 43–77)

## 2010-10-23 LAB — APTT: aPTT: 35 seconds (ref 24–37)

## 2010-11-06 LAB — CBC
HCT: 25.1 — ABNORMAL LOW
HCT: 30.3 — ABNORMAL LOW
Hemoglobin: 10.4 — ABNORMAL LOW
Hemoglobin: 7.9 — CL
Hemoglobin: 8.5 — ABNORMAL LOW
Hemoglobin: 8.6 — ABNORMAL LOW
MCHC: 34.3
MCHC: 34.4
MCV: 94.4
Platelets: 128 — ABNORMAL LOW
Platelets: 150
Platelets: 157
RBC: 3.25 — ABNORMAL LOW
RDW: 12.8
RDW: 13
RDW: 13.1
RDW: 13.1
WBC: 4.3

## 2010-11-06 LAB — BASIC METABOLIC PANEL
BUN: 10
BUN: 13
BUN: 9
CO2: 23
CO2: 28
CO2: 29
CO2: 29
Calcium: 8.8
Calcium: 8.8
Chloride: 96
Creatinine, Ser: 0.96
GFR calc Af Amer: >60
GFR calc non Af Amer: 57 — ABNORMAL LOW
GFR calc non Af Amer: >60
GFR calc non Af Amer: >60
GFR calc non Af Amer: >60
GFR calc non Af Amer: >60
Glucose, Bld: 121 — ABNORMAL HIGH
Glucose, Bld: 133 — ABNORMAL HIGH
Glucose, Bld: 134 — ABNORMAL HIGH
Glucose, Bld: 136 — ABNORMAL HIGH
Glucose, Bld: 142 — ABNORMAL HIGH
Potassium: 3.9
Potassium: 4.1
Potassium: 4.3
Potassium: 4.3
Sodium: 127 — ABNORMAL LOW
Sodium: 127 — ABNORMAL LOW
Sodium: 130 — ABNORMAL LOW
Sodium: 134 — ABNORMAL LOW
Sodium: 137

## 2011-01-14 ENCOUNTER — Other Ambulatory Visit: Payer: Self-pay | Admitting: Neurological Surgery

## 2011-01-14 DIAGNOSIS — M47817 Spondylosis without myelopathy or radiculopathy, lumbosacral region: Secondary | ICD-10-CM

## 2011-01-21 ENCOUNTER — Ambulatory Visit
Admission: RE | Admit: 2011-01-21 | Discharge: 2011-01-21 | Disposition: A | Discharge status: Home / Self Care | Payer: Medicare Other | Source: Ambulatory Visit | Attending: Neurological Surgery | Admitting: Neurological Surgery

## 2011-01-21 DIAGNOSIS — M545 Low back pain: Secondary | ICD-10-CM | POA: Diagnosis not present

## 2011-01-21 DIAGNOSIS — M47817 Spondylosis without myelopathy or radiculopathy, lumbosacral region: Secondary | ICD-10-CM

## 2011-01-21 DIAGNOSIS — R269 Unspecified abnormalities of gait and mobility: Secondary | ICD-10-CM | POA: Diagnosis not present

## 2011-01-30 DIAGNOSIS — M47817 Spondylosis without myelopathy or radiculopathy, lumbosacral region: Secondary | ICD-10-CM | POA: Diagnosis not present

## 2011-02-13 DIAGNOSIS — K1370 Unspecified lesions of oral mucosa: Secondary | ICD-10-CM | POA: Diagnosis not present

## 2011-02-13 DIAGNOSIS — K1321 Leukoplakia of oral mucosa, including tongue: Secondary | ICD-10-CM | POA: Diagnosis not present

## 2011-02-18 DIAGNOSIS — K1370 Unspecified lesions of oral mucosa: Secondary | ICD-10-CM | POA: Diagnosis not present

## 2011-02-18 DIAGNOSIS — K1321 Leukoplakia of oral mucosa, including tongue: Secondary | ICD-10-CM | POA: Diagnosis not present

## 2011-04-28 DIAGNOSIS — G609 Hereditary and idiopathic neuropathy, unspecified: Secondary | ICD-10-CM | POA: Diagnosis not present

## 2011-04-28 DIAGNOSIS — R05 Cough: Secondary | ICD-10-CM | POA: Diagnosis not present

## 2011-04-28 DIAGNOSIS — K120 Recurrent oral aphthae: Secondary | ICD-10-CM | POA: Diagnosis not present

## 2011-06-16 DIAGNOSIS — H1030 Unspecified acute conjunctivitis, unspecified eye: Secondary | ICD-10-CM | POA: Diagnosis not present

## 2011-06-22 DIAGNOSIS — G609 Hereditary and idiopathic neuropathy, unspecified: Secondary | ICD-10-CM | POA: Diagnosis not present

## 2011-06-22 DIAGNOSIS — I10 Essential (primary) hypertension: Secondary | ICD-10-CM | POA: Diagnosis not present

## 2011-06-22 DIAGNOSIS — E785 Hyperlipidemia, unspecified: Secondary | ICD-10-CM | POA: Diagnosis not present

## 2011-06-22 DIAGNOSIS — M1990 Unspecified osteoarthritis, unspecified site: Secondary | ICD-10-CM | POA: Diagnosis not present

## 2011-07-06 ENCOUNTER — Emergency Department (HOSPITAL_COMMUNITY): Payer: Medicare Other

## 2011-07-06 ENCOUNTER — Encounter (HOSPITAL_COMMUNITY): Payer: Self-pay | Admitting: *Deleted

## 2011-07-06 ENCOUNTER — Emergency Department (HOSPITAL_COMMUNITY)
Admission: EM | Admit: 2011-07-06 | Discharge: 2011-07-06 | Disposition: A | Discharge status: Home / Self Care | Payer: Medicare Other | Attending: Emergency Medicine | Admitting: Emergency Medicine

## 2011-07-06 DIAGNOSIS — S0003XA Contusion of scalp, initial encounter: Secondary | ICD-10-CM | POA: Diagnosis not present

## 2011-07-06 DIAGNOSIS — Z79899 Other long term (current) drug therapy: Secondary | ICD-10-CM | POA: Insufficient documentation

## 2011-07-06 DIAGNOSIS — I10 Essential (primary) hypertension: Secondary | ICD-10-CM | POA: Diagnosis not present

## 2011-07-06 DIAGNOSIS — W19XXXA Unspecified fall, initial encounter: Secondary | ICD-10-CM

## 2011-07-06 DIAGNOSIS — I6789 Other cerebrovascular disease: Secondary | ICD-10-CM | POA: Diagnosis not present

## 2011-07-06 DIAGNOSIS — R51 Headache: Secondary | ICD-10-CM | POA: Insufficient documentation

## 2011-07-06 DIAGNOSIS — W1809XA Striking against other object with subsequent fall, initial encounter: Secondary | ICD-10-CM | POA: Insufficient documentation

## 2011-07-06 DIAGNOSIS — S0093XA Contusion of unspecified part of head, initial encounter: Secondary | ICD-10-CM

## 2011-07-06 DIAGNOSIS — R404 Transient alteration of awareness: Secondary | ICD-10-CM | POA: Diagnosis not present

## 2011-07-06 DIAGNOSIS — Y92009 Unspecified place in unspecified non-institutional (private) residence as the place of occurrence of the external cause: Secondary | ICD-10-CM | POA: Insufficient documentation

## 2011-07-06 HISTORY — DX: Traumatic subdural hemorrhage with loss of consciousness status unknown, initial encounter: S06.5XAA

## 2011-07-06 HISTORY — DX: Traumatic subdural hemorrhage with loss of consciousness of unspecified duration, initial encounter: S06.5X9A

## 2011-07-06 HISTORY — DX: Polyneuropathy, unspecified: G62.9

## 2011-07-06 HISTORY — DX: Essential (primary) hypertension: I10

## 2011-07-06 LAB — CBC
MCHC: 34.3 g/dL (ref 30.0–36.0)
Platelets: 221 10*3/uL (ref 150–400)
RDW: 12.6 % (ref 11.5–15.5)

## 2011-07-06 LAB — BASIC METABOLIC PANEL
GFR calc Af Amer: 67 mL/min — ABNORMAL LOW (ref >90)
GFR calc non Af Amer: 58 mL/min — ABNORMAL LOW (ref >90)
Potassium: 5.3 mEq/L — ABNORMAL HIGH (ref 3.5–5.1)
Sodium: 131 mEq/L — ABNORMAL LOW (ref 135–145)

## 2011-07-06 NOTE — ED Notes (Signed)
Pt thinks she fainted and fell and hit tub 10 days ago.  Pt states that she has had 2 subdural hematomas in the past.  Pt states she has had headaches ever since.  Pt reports dizziness

## 2011-07-06 NOTE — Discharge Instructions (Signed)
Head Injury, Adult  You have had a head injury that does not appear serious at this time. A concussion is a state of changed mental ability, usually from a blow to the head. You should take clear liquids for the rest of the day and then resume your regular diet. You should not take sedatives or alcoholic beverages for as long as directed by your caregiver after discharge. After injuries such as yours, most problems occur within the first 24 hours.  SYMPTOMS  These minor symptoms may be experienced after discharge:  · Memory difficulties.  · Dizziness.  · Headaches.  · Double vision.  · Hearing difficulties.  · Depression.  · Tiredness.  · Weakness.  · Difficulty with concentration.  If you experience any of these problems, you should not be alarmed. A concussion requires a few days for recovery. Many patients with head injuries frequently experience such symptoms. Usually, these problems disappear without medical care. If symptoms last for more than one day, notify your caregiver. See your caregiver sooner if symptoms are becoming worse rather than better.  HOME CARE INSTRUCTIONS   · During the next 24 hours you must stay with someone who can watch you for the warning signs listed below.  Although it is unlikely that serious side effects will occur, you should be aware of signs and symptoms which may necessitate your return to this location. Side effects may occur up to 7 - 10 days following the injury. It is important for you to carefully monitor your condition and contact your caregiver or seek immediate medical attention if there is a change in your condition.  SEEK IMMEDIATE MEDICAL CARE IF:   · There is confusion or drowsiness.  · You can not awaken the injured person.  · There is nausea (feeling sick to your stomach) or continued, forceful vomiting.  · You notice dizziness or unsteadiness which is getting worse, or inability to walk.  · You have convulsions or unconsciousness.  · You experience severe,  persistent headaches not relieved by over-the-counter or prescription medicines for pain. (Do not take aspirin as this impairs clotting abilities). Take other pain medications only as directed.  · You can not use arms or legs normally.  · There is clear or bloody discharge from the nose or ears.  MAKE SURE YOU:   · Understand these instructions.  · Will watch your condition.  · Will get help right away if you are not doing well or get worse.  Document Released: 01/05/2005 Document Revised: 12/25/2010 Document Reviewed: 11/23/2008  ExitCare® Patient Information ©2012 [employer].

## 2011-07-06 NOTE — ED Provider Notes (Signed)
History     CSN: [id number]  Arrival date & time 07/06/11  1053   First MD Initiated Contact with Patient 07/06/11 1141      Chief Complaint  Patient presents with  . Loss of Consciousness    (Consider location/radiation/quality/duration/timing/severity/associated sxs/prior treatment) HPI Comments: Patient states has had 2 subdurals in the past.  Larey Seat 10 days ago and struck head on the tub.  Unsure loc.  Has had headache since that time.  She attempted to find another neurosurgeon, however she tells me that hers would "not release her records".  After 10 days she comes here.    Patient is a 78 y.o. female presenting with head injury. The history is provided by the patient.  Head Injury  Incident onset: 10 days ago. She came to the ER via walk-in. The injury mechanism was a direct blow and a fall. Length of episode of loss of consciousness: unsure. There was no blood loss. The quality of the pain is described as dull. The pain is moderate. The pain has been constant since the injury. Pertinent negatives include no numbness, no blurred vision, no vomiting and patient does not experience disorientation.    Past Medical History  Diagnosis Date  . Subdural hematoma   . Peripheral neuropathy   . Hypertension     Past Surgical History  Procedure Date  . Back surgery   . Burr hole for subdural hematoma     No family history on file.  History  Substance Use Topics  . Smoking status: Never Smoker   . Smokeless tobacco: Not on file  . Alcohol Use: Yes     2 glasses of wine per nite    OB History    Grav Para Term Preterm Abortions TAB SAB Ect Mult Living                  Review of Systems  Eyes: Negative for blurred vision.  Gastrointestinal: Negative for vomiting.  Neurological: Negative for numbness.  All other systems reviewed and are negative.    Allergies  Codeine  Home Medications   Current Outpatient Rx  Name Route Sig Dispense Refill  . BIOTIN PO Oral  Take 1 tablet by mouth daily.    Marland Kitchen VITAMIN B-12 PO Oral Take 1 tablet by mouth daily.    Marland Kitchen ESCITALOPRAM OXALATE 20 MG PO TABS Oral Take 20 mg by mouth daily.    Marland Kitchen LATANOPROST 0.005 % OP SOLN Both Eyes Place 1 drop into both eyes at bedtime.    Marland Kitchen LOSARTAN POTASSIUM 25 MG PO TABS Oral Take 25 mg by mouth 2 (two) times daily.    Marland Kitchen MAGNESIUM PO Oral Take 1 tablet by mouth at bedtime.    Marland Kitchen METOPROLOL TARTRATE 100 MG PO TABS Oral Take 50 mg by mouth 2 (two) times daily.    Marland Kitchen NAPROXEN 500 MG PO TABS Oral Take 500 mg by mouth 2 (two) times daily with a meal.    . MELATONIN PO Oral Take 1 tablet by mouth at bedtime.    Marland Kitchen SIMVASTATIN 20 MG PO TABS Oral Take 20 mg by mouth every evening.    Marland Kitchen ZOLPIDEM TARTRATE 10 MG PO TABS Oral Take 10 mg by mouth at bedtime as needed. For insomnia.      BP 158/72  Pulse 49  Temp 98.4 F (36.9 C) (Oral)  Resp 18  SpO2 99%  Physical Exam  Nursing note and vitals reviewed. Constitutional: She is oriented to person,  place, and time. She appears well-developed and well-nourished. No distress.  HENT:  Head: Normocephalic and atraumatic.  Neck: Normal range of motion. Neck supple.  Cardiovascular: Normal rate and regular rhythm.  Exam reveals no gallop and no friction rub.   No murmur heard. Pulmonary/Chest: Effort normal and breath sounds normal. No respiratory distress. She has no wheezes.  Abdominal: Soft. Bowel sounds are normal. She exhibits no distension. There is no tenderness.  Musculoskeletal: Normal range of motion.  Neurological: She is alert and oriented to person, place, and time. No cranial nerve deficit. She exhibits normal muscle tone. Coordination normal.  Skin: Skin is warm and dry. She is not diaphoretic.    ED Course  Procedures (including critical care time)   Labs Reviewed  CBC  BASIC METABOLIC PANEL   No results found.   No diagnosis found.   Date: 07/06/2011  Rate: 44  Rhythm: sinus bradycardia  QRS Axis: left  Intervals:  normal  ST/T Wave abnormalities: normal  Conduction Disutrbances:none  Narrative Interpretation:   Old EKG Reviewed: unchanged    MDM  The ct does not show any evidence of intracranial hemorrhage or trauma.  She will be discharged to home, to return prn if worsens.          Geoffery Lyons, MD 07/06/11 1311

## 2011-07-20 DIAGNOSIS — S060X9A Concussion with loss of consciousness of unspecified duration, initial encounter: Secondary | ICD-10-CM | POA: Diagnosis not present

## 2011-09-25 DIAGNOSIS — H409 Unspecified glaucoma: Secondary | ICD-10-CM | POA: Diagnosis not present

## 2011-09-25 DIAGNOSIS — H4011X0 Primary open-angle glaucoma, stage unspecified: Secondary | ICD-10-CM | POA: Diagnosis not present

## 2011-10-01 DIAGNOSIS — H409 Unspecified glaucoma: Secondary | ICD-10-CM | POA: Diagnosis not present

## 2011-10-01 DIAGNOSIS — H4011X0 Primary open-angle glaucoma, stage unspecified: Secondary | ICD-10-CM | POA: Diagnosis not present

## 2011-10-16 DIAGNOSIS — G609 Hereditary and idiopathic neuropathy, unspecified: Secondary | ICD-10-CM | POA: Diagnosis not present

## 2011-10-16 DIAGNOSIS — Z23 Encounter for immunization: Secondary | ICD-10-CM | POA: Diagnosis not present

## 2011-10-16 DIAGNOSIS — R05 Cough: Secondary | ICD-10-CM | POA: Diagnosis not present

## 2011-10-16 DIAGNOSIS — R61 Generalized hyperhidrosis: Secondary | ICD-10-CM | POA: Diagnosis not present

## 2011-10-16 DIAGNOSIS — K120 Recurrent oral aphthae: Secondary | ICD-10-CM | POA: Diagnosis not present

## 2011-11-30 DIAGNOSIS — M5137 Other intervertebral disc degeneration, lumbosacral region: Secondary | ICD-10-CM | POA: Diagnosis not present

## 2011-11-30 DIAGNOSIS — M999 Biomechanical lesion, unspecified: Secondary | ICD-10-CM | POA: Diagnosis not present

## 2011-12-01 DIAGNOSIS — M5137 Other intervertebral disc degeneration, lumbosacral region: Secondary | ICD-10-CM | POA: Diagnosis not present

## 2011-12-01 DIAGNOSIS — M999 Biomechanical lesion, unspecified: Secondary | ICD-10-CM | POA: Diagnosis not present

## 2011-12-03 DIAGNOSIS — M5137 Other intervertebral disc degeneration, lumbosacral region: Secondary | ICD-10-CM | POA: Diagnosis not present

## 2011-12-03 DIAGNOSIS — M999 Biomechanical lesion, unspecified: Secondary | ICD-10-CM | POA: Diagnosis not present

## 2011-12-09 DIAGNOSIS — M5137 Other intervertebral disc degeneration, lumbosacral region: Secondary | ICD-10-CM | POA: Diagnosis not present

## 2011-12-09 DIAGNOSIS — M999 Biomechanical lesion, unspecified: Secondary | ICD-10-CM | POA: Diagnosis not present

## 2011-12-10 DIAGNOSIS — M999 Biomechanical lesion, unspecified: Secondary | ICD-10-CM | POA: Diagnosis not present

## 2011-12-10 DIAGNOSIS — M5137 Other intervertebral disc degeneration, lumbosacral region: Secondary | ICD-10-CM | POA: Diagnosis not present

## 2011-12-11 DIAGNOSIS — E785 Hyperlipidemia, unspecified: Secondary | ICD-10-CM | POA: Diagnosis not present

## 2011-12-11 DIAGNOSIS — I10 Essential (primary) hypertension: Secondary | ICD-10-CM | POA: Diagnosis not present

## 2011-12-21 DIAGNOSIS — M999 Biomechanical lesion, unspecified: Secondary | ICD-10-CM | POA: Diagnosis not present

## 2011-12-21 DIAGNOSIS — M5137 Other intervertebral disc degeneration, lumbosacral region: Secondary | ICD-10-CM | POA: Diagnosis not present

## 2011-12-24 DIAGNOSIS — I10 Essential (primary) hypertension: Secondary | ICD-10-CM | POA: Diagnosis not present

## 2011-12-24 DIAGNOSIS — M5137 Other intervertebral disc degeneration, lumbosacral region: Secondary | ICD-10-CM | POA: Diagnosis not present

## 2011-12-24 DIAGNOSIS — R109 Unspecified abdominal pain: Secondary | ICD-10-CM | POA: Diagnosis not present

## 2011-12-24 DIAGNOSIS — Z0000 Encounter for general adult medical examination without abnormal findings: Secondary | ICD-10-CM | POA: Diagnosis not present

## 2011-12-24 DIAGNOSIS — M999 Biomechanical lesion, unspecified: Secondary | ICD-10-CM | POA: Diagnosis not present

## 2011-12-24 DIAGNOSIS — E785 Hyperlipidemia, unspecified: Secondary | ICD-10-CM | POA: Diagnosis not present

## 2011-12-28 DIAGNOSIS — Z1212 Encounter for screening for malignant neoplasm of rectum: Secondary | ICD-10-CM | POA: Diagnosis not present

## 2011-12-28 DIAGNOSIS — M5137 Other intervertebral disc degeneration, lumbosacral region: Secondary | ICD-10-CM | POA: Diagnosis not present

## 2011-12-28 DIAGNOSIS — M999 Biomechanical lesion, unspecified: Secondary | ICD-10-CM | POA: Diagnosis not present

## 2011-12-31 DIAGNOSIS — M999 Biomechanical lesion, unspecified: Secondary | ICD-10-CM | POA: Diagnosis not present

## 2011-12-31 DIAGNOSIS — H409 Unspecified glaucoma: Secondary | ICD-10-CM | POA: Diagnosis not present

## 2011-12-31 DIAGNOSIS — H4011X0 Primary open-angle glaucoma, stage unspecified: Secondary | ICD-10-CM | POA: Diagnosis not present

## 2011-12-31 DIAGNOSIS — M5137 Other intervertebral disc degeneration, lumbosacral region: Secondary | ICD-10-CM | POA: Diagnosis not present

## 2012-01-04 DIAGNOSIS — M5137 Other intervertebral disc degeneration, lumbosacral region: Secondary | ICD-10-CM | POA: Diagnosis not present

## 2012-01-04 DIAGNOSIS — M999 Biomechanical lesion, unspecified: Secondary | ICD-10-CM | POA: Diagnosis not present

## 2012-01-18 ENCOUNTER — Other Ambulatory Visit: Payer: Self-pay | Admitting: Internal Medicine

## 2012-01-18 DIAGNOSIS — R109 Unspecified abdominal pain: Secondary | ICD-10-CM

## 2012-01-21 ENCOUNTER — Ambulatory Visit
Admission: RE | Admit: 2012-01-21 | Discharge: 2012-01-21 | Disposition: A | Discharge status: Home / Self Care | Payer: Medicare Other | Source: Ambulatory Visit | Attending: Internal Medicine | Admitting: Internal Medicine

## 2012-01-21 DIAGNOSIS — K5730 Diverticulosis of large intestine without perforation or abscess without bleeding: Secondary | ICD-10-CM | POA: Diagnosis not present

## 2012-01-21 DIAGNOSIS — R109 Unspecified abdominal pain: Secondary | ICD-10-CM

## 2012-01-21 DIAGNOSIS — K4020 Bilateral inguinal hernia, without obstruction or gangrene, not specified as recurrent: Secondary | ICD-10-CM | POA: Diagnosis not present

## 2012-01-22 DIAGNOSIS — L909 Atrophic disorder of skin, unspecified: Secondary | ICD-10-CM | POA: Diagnosis not present

## 2012-01-22 DIAGNOSIS — B079 Viral wart, unspecified: Secondary | ICD-10-CM | POA: Diagnosis not present

## 2012-01-22 DIAGNOSIS — D2330 Other benign neoplasm of skin of unspecified part of face: Secondary | ICD-10-CM | POA: Diagnosis not present

## 2012-01-22 DIAGNOSIS — L821 Other seborrheic keratosis: Secondary | ICD-10-CM | POA: Diagnosis not present

## 2012-01-22 DIAGNOSIS — L820 Inflamed seborrheic keratosis: Secondary | ICD-10-CM | POA: Diagnosis not present

## 2012-01-22 DIAGNOSIS — D485 Neoplasm of uncertain behavior of skin: Secondary | ICD-10-CM | POA: Diagnosis not present

## 2012-01-22 DIAGNOSIS — D239 Other benign neoplasm of skin, unspecified: Secondary | ICD-10-CM | POA: Diagnosis not present

## 2012-01-29 ENCOUNTER — Encounter: Payer: Self-pay | Admitting: Internal Medicine

## 2012-02-12 DIAGNOSIS — L259 Unspecified contact dermatitis, unspecified cause: Secondary | ICD-10-CM | POA: Diagnosis not present

## 2012-02-12 DIAGNOSIS — R05 Cough: Secondary | ICD-10-CM | POA: Diagnosis not present

## 2012-02-15 ENCOUNTER — Ambulatory Visit: Payer: Medicare Other | Admitting: Internal Medicine

## 2012-02-23 ENCOUNTER — Encounter: Payer: Self-pay | Admitting: Internal Medicine

## 2012-02-25 ENCOUNTER — Ambulatory Visit: Payer: Medicare Other | Admitting: Internal Medicine

## 2012-03-18 DIAGNOSIS — H04129 Dry eye syndrome of unspecified lacrimal gland: Secondary | ICD-10-CM | POA: Diagnosis not present

## 2012-03-18 DIAGNOSIS — H16109 Unspecified superficial keratitis, unspecified eye: Secondary | ICD-10-CM | POA: Diagnosis not present

## 2012-03-22 ENCOUNTER — Encounter: Payer: Self-pay | Admitting: Internal Medicine

## 2012-03-22 ENCOUNTER — Ambulatory Visit (INDEPENDENT_AMBULATORY_CARE_PROVIDER_SITE_OTHER): Payer: Medicare Other | Admitting: Internal Medicine

## 2012-03-22 VITALS — BP 170/80 | HR 62 | Ht 67.0 in | Wt 175.0 lb

## 2012-03-22 DIAGNOSIS — K219 Gastro-esophageal reflux disease without esophagitis: Secondary | ICD-10-CM | POA: Diagnosis not present

## 2012-03-22 DIAGNOSIS — K5730 Diverticulosis of large intestine without perforation or abscess without bleeding: Secondary | ICD-10-CM

## 2012-03-22 DIAGNOSIS — R1084 Generalized abdominal pain: Secondary | ICD-10-CM

## 2012-03-22 NOTE — Patient Instructions (Addendum)
You have been scheduled for an endoscopy with propofol. Please follow written instructions given to you at your visit today. If you use inhalers (even only as needed), please bring them with you on the day of your procedure.   You have been scheduled for an abdominal ultrasound at [facility] (1st floor of hospital) on 03-28-12 at 11:00am. Please arrive 15 minutes prior to your appointment for registration. Make certain not to have anything to eat or drink after midnight prior to your appointment. Should you need to reschedule your appointment, please contact radiology at [phone]. This test typically takes about 30 minutes to perform.

## 2012-03-22 NOTE — Progress Notes (Signed)
HISTORY OF PRESENT ILLNESS:  [name] is a 79 y.o. female with hypertension, hyperlipidemia, history of atrial fibrillation, and chronic back pain. She presents today for evaluation of abdominal pain upon referral from her primary care provider, Dr. Jacky Kindle. The patient was seen here in [place] 2005 4 routine screening colonoscopy and minor hematochezia. She was found to have sigmoid diverticulosis. No other abnormalities. She now reports a one-year history of intermittent problems with abdominal pain. This was most noticeable prior to Christmas. She describes the discomfort as aching. It is noticed in the abdomen diffusely. Increased with direct pressure such as placing a block on the abdomen for reading. The discomfort tends to occur a day or several days at a time than resolves. She notices the discomfort about 15 out of every 30 days. There is associated nausea. The discomfort is exacerbated by meals. It has been recent reflux type symptoms. Temporary treatment with Prilosec for a few weeks helped. He is no longer taking PPI at the recommendation of her daughter. She does take daily NSAIDs in the form of Naprosyn. She has had no change in bowel habits, no bleeding, no weight loss. Review of outside records shows a CT scan of the pelvis without contrast January 2014. This revealed diverticulosis, bilateral inguinal hernias with fat, and a Sartorius muscle lipoma. Review of outside laboratories from November reveals normal liver function tests, normal CBC with hemoglobin of 14, normal TSH, and Hemoccult negative stool. She will be traveling to [place] this April  REVIEW OF SYSTEMS:  All non-GI ROS negative except for arthritis, back pain, visual change, cough, itching, sleeping problems, excessive thirst, excessive urination  Past Medical History  Diagnosis Date  . Subdural hematoma   . Peripheral neuropathy   . Hypertension   . Spondylosis     lumbar  . Hyperlipidemia   . DDD  (degenerative disc disease), cervical   . Depression   . Diverticulosis   . Arthritis   . Atrial fibrillation   . Hyperlipidemia     Past Surgical History  Procedure Laterality Date  . Back surgery    . Burr hole for subdural hematoma    . Knee surgery    . Vaginal hysterectomy      Social History [name]  reports that she has never smoked. She has never used smokeless tobacco. She reports that  drinks alcohol. She reports that she does not use illicit drugs.  family history includes Breast cancer in her sister and Heart disease in her father.  Allergies  Allergen Reactions  . Codeine Other (See Comments)    unknown       PHYSICAL EXAMINATION: Vital signs: BP 170/80  Pulse 62  Ht 5\' 7"  (1.702 m)  Wt 175 lb (79.379 kg)  BMI 27.4 kg/m2  Constitutional: generally well-appearing, no acute distress Psychiatric: alert and oriented x3, cooperative Eyes: extraocular movements intact, anicteric, conjunctiva pink Mouth: oral pharynx moist, no lesions Neck: supple no lymphadenopathy Cardiovascular: heart regular rate and rhythm, no murmur Lungs: clear to auscultation bilaterally Abdomen: soft, obese, mild tenderness around the midabdomen, nondistended, no obvious ascites, no peritoneal signs, normal bowel sounds, no organomegaly Rectal: Omitted Extremities: no lower extremity edema bilaterally Skin: no lesions on visible extremities Neuro: No focal deficits. No asterixis.    ASSESSMENT:  #1. Recurrent abdominal pain as described. Rule out peptic ulcer disease, GERD, gallbladder disease #2. GERD #3. Diverticulosis on colonoscopy in 2005   PLAN:  #1. Abdominal ultrasound #2. Diagnostic upper  endoscopy.The nature of the procedure, as well as the risks, benefits, and alternatives were carefully and thoroughly reviewed with the patient. Ample time for discussion and questions allowed. The patient understood, was satisfied, and agreed to proceed. #3. Consider  extended trial of PPI after the above results known. Decisions thereafter regarding additional studies (IE, contrast-enhanced imaging of the abdomen) based on clinical response

## 2012-03-23 ENCOUNTER — Ambulatory Visit (AMBULATORY_SURGERY_CENTER): Payer: Medicare Other | Admitting: Internal Medicine

## 2012-03-23 ENCOUNTER — Encounter: Payer: Self-pay | Admitting: *Deleted

## 2012-03-23 ENCOUNTER — Encounter: Payer: Self-pay | Admitting: Internal Medicine

## 2012-03-23 VITALS — BP 171/89 | HR 47 | Temp 97.9°F | Resp 0 | Ht 67.0 in | Wt 175.0 lb

## 2012-03-23 DIAGNOSIS — K219 Gastro-esophageal reflux disease without esophagitis: Secondary | ICD-10-CM

## 2012-03-23 DIAGNOSIS — K2900 Acute gastritis without bleeding: Secondary | ICD-10-CM

## 2012-03-23 DIAGNOSIS — R1084 Generalized abdominal pain: Secondary | ICD-10-CM

## 2012-03-23 DIAGNOSIS — E785 Hyperlipidemia, unspecified: Secondary | ICD-10-CM | POA: Diagnosis not present

## 2012-03-23 DIAGNOSIS — I10 Essential (primary) hypertension: Secondary | ICD-10-CM | POA: Diagnosis not present

## 2012-03-23 DIAGNOSIS — R109 Unspecified abdominal pain: Secondary | ICD-10-CM | POA: Diagnosis not present

## 2012-03-23 MED ORDER — OMEPRAZOLE 40 MG PO CPDR: 40.0000 mg | DELAYED_RELEASE_CAPSULE | Freq: Every day | ORAL | Status: DC

## 2012-03-23 MED ORDER — SODIUM CHLORIDE 0.9 % IV SOLN: 500.0000 mL | INTRAVENOUS | Status: DC

## 2012-03-23 NOTE — Op Note (Signed)
[facility] [address] [place], [address]   ENDOSCOPY PROCEDURE REPORT  PATIENT: Cynthia, West  MR#: [id number] BIRTHDATE: [birth year] , 79  yrs. old GENDER: Female ENDOSCOPIST: Roxy Cedar, MD REFERRED BY:  Geoffry Paradise, M.D. PROCEDURE DATE:  03/23/2012 PROCEDURE:  EGD w/ biopsy ASA CLASS:     Class II INDICATIONS:  abdominal pain. MEDICATIONS: MAC sedation, administered by CRNA and propofol (Diprivan) 200mg  IV TOPICAL ANESTHETIC: none  DESCRIPTION OF PROCEDURE: After the risks benefits and alternatives of the procedure were thoroughly explained, informed consent was obtained.  The LB-GIF Q180 [id number] endoscope was introduced through the mouth and advanced to the second portion of the duodenum. Without limitations.  The instrument was slowly withdrawn as the mucosa was fully examined.      Distal reflux esophagitis.  Multiple antral erosions and small prepyloric ulcer.  CLO bx taken.  Normal duodenum.  Retroflexed views revealed no abnormalities.     The scope was then withdrawn from the patient and the procedure completed.  COMPLICATIONS: There were no complications. ENDOSCOPIC IMPRESSION: 1. Distal esophagitis. 2. Multiple antral erosions and small prepyloric ulcer.  CLO bx taken. 3. Otherwise  Normal exam  RECOMMENDATIONS: 1.  Prescribe omeprazole 40mg  PO daily; #30; 11 refills 2.  Avoid NSAIDS (Naprosyn and like medication) if possible. Discuss alternative therapy, if needed, with Dr Jacky Kindle 3.  Keep Scheduled Abdominal Ultrasound Appointment 03-28-2012 4.  Call office next 2-3 days to schedule an office appointment for4 weeks (prior to your trip)  REPEAT EXAM:  eSigned:  Roxy Cedar, MD 03/23/2012 11:32 AM   VH:QIONGEX Jacky Kindle, MD and The Patient

## 2012-03-23 NOTE — Patient Instructions (Addendum)

## 2012-03-23 NOTE — Progress Notes (Signed)
Patient did not experience any of the following events: a burn prior to discharge; a fall within the facility; wrong site/side/patient/procedure/implant event; or a hospital transfer or hospital admission upon discharge from the facility. (G8907) Patient did not have preoperative order for IV antibiotic SSI prophylaxis. (G8918)  

## 2012-03-23 NOTE — Progress Notes (Signed)
Called to room to assist during endoscopic procedure.  Patient ID and intended procedure confirmed with present staff. Received instructions for my participation in the procedure from the performing physician.  

## 2012-03-24 ENCOUNTER — Telehealth: Payer: Self-pay | Admitting: *Deleted

## 2012-03-24 NOTE — Telephone Encounter (Signed)
No answer, no message machine, just continuous ringing. No message left.

## 2012-03-24 NOTE — Telephone Encounter (Signed)
Patient phoned back in and spoke with [facility] and stated that she is feeling just fine, has no complaints and without any complications.

## 2012-03-28 ENCOUNTER — Ambulatory Visit (HOSPITAL_COMMUNITY)
Admission: RE | Admit: 2012-03-28 | Discharge: 2012-03-28 | Disposition: A | Discharge status: Home / Self Care | Payer: Medicare Other | Source: Ambulatory Visit | Attending: Internal Medicine | Admitting: Internal Medicine

## 2012-03-28 DIAGNOSIS — R109 Unspecified abdominal pain: Secondary | ICD-10-CM | POA: Insufficient documentation

## 2012-03-28 DIAGNOSIS — K7689 Other specified diseases of liver: Secondary | ICD-10-CM | POA: Diagnosis not present

## 2012-03-28 DIAGNOSIS — K219 Gastro-esophageal reflux disease without esophagitis: Secondary | ICD-10-CM

## 2012-03-28 DIAGNOSIS — R1084 Generalized abdominal pain: Secondary | ICD-10-CM

## 2012-04-11 DIAGNOSIS — M999 Biomechanical lesion, unspecified: Secondary | ICD-10-CM | POA: Diagnosis not present

## 2012-04-11 DIAGNOSIS — M5137 Other intervertebral disc degeneration, lumbosacral region: Secondary | ICD-10-CM | POA: Diagnosis not present

## 2012-04-13 DIAGNOSIS — M5137 Other intervertebral disc degeneration, lumbosacral region: Secondary | ICD-10-CM | POA: Diagnosis not present

## 2012-04-13 DIAGNOSIS — M999 Biomechanical lesion, unspecified: Secondary | ICD-10-CM | POA: Diagnosis not present

## 2012-04-20 DIAGNOSIS — M999 Biomechanical lesion, unspecified: Secondary | ICD-10-CM | POA: Diagnosis not present

## 2012-04-20 DIAGNOSIS — M5137 Other intervertebral disc degeneration, lumbosacral region: Secondary | ICD-10-CM | POA: Diagnosis not present

## 2012-04-21 ENCOUNTER — Ambulatory Visit (INDEPENDENT_AMBULATORY_CARE_PROVIDER_SITE_OTHER): Payer: Medicare Other | Admitting: Internal Medicine

## 2012-04-21 ENCOUNTER — Encounter: Payer: Self-pay | Admitting: Internal Medicine

## 2012-04-21 VITALS — BP 140/60 | HR 56 | Ht 67.0 in | Wt 178.8 lb

## 2012-04-21 DIAGNOSIS — K259 Gastric ulcer, unspecified as acute or chronic, without hemorrhage or perforation: Secondary | ICD-10-CM

## 2012-04-21 DIAGNOSIS — K2970 Gastritis, unspecified, without bleeding: Secondary | ICD-10-CM

## 2012-04-21 DIAGNOSIS — R1084 Generalized abdominal pain: Secondary | ICD-10-CM | POA: Diagnosis not present

## 2012-04-21 DIAGNOSIS — K219 Gastro-esophageal reflux disease without esophagitis: Secondary | ICD-10-CM

## 2012-04-21 DIAGNOSIS — K2990 Gastroduodenitis, unspecified, without bleeding: Secondary | ICD-10-CM

## 2012-04-21 NOTE — Patient Instructions (Addendum)
Please follow up with Dr. Perry as needed 

## 2012-04-21 NOTE — Progress Notes (Signed)
HISTORY OF PRESENT ILLNESS:  [name] is a 79 y.o. female  With the below listed medical history who presents today for followup. She was initially seen 03/22/2012 regarding abdominal pain. See that dictation for details. She subsequently underwent abdominal ultrasound which was normal. Thereafter, on 03/23/2012 upper endoscopy was performed. She was found to have distal esophagitis as well as multiple antral erosions and a small prepyloric ulcer. CLO biopsy was negative. She had been taking NSAIDs in the form of Naprosyn. She was placed on omeprazole 40 mg daily and asked to avoid NSAIDs. She presents today for followup as recommended. She is pleased to report complete resolution of abdominal pain. No medication side effects. No new issues.  REVIEW OF SYSTEMS:  All non-GI ROS negative except for arthritis, back pain, rash, itching, hoarseness  Past Medical History  Diagnosis Date  . Subdural hematoma   . Peripheral neuropathy   . Hypertension   . Spondylosis     lumbar  . Hyperlipidemia   . DDD (degenerative disc disease), cervical   . Depression   . Diverticulosis   . Arthritis   . Atrial fibrillation   . Hyperlipidemia     Past Surgical History  Procedure Laterality Date  . Back surgery    . Burr hole for subdural hematoma      x 2 hematoma  . Knee surgery Bilateral   . Vaginal hysterectomy    . Appendectomy      Social History [name]  reports that she has never smoked. She has never used smokeless tobacco. She reports that she drinks about 8.4 ounces of alcohol per week. She reports that she does not use illicit drugs.  family history includes Breast cancer in her sister and Heart disease in her father.  There is no history of Colon cancer, and Diabetes, and Liver disease, and Kidney disease, .  Allergies  Allergen Reactions  . Codeine Other (See Comments)    unknown       PHYSICAL EXAMINATION: Vital signs: BP 140/60  Pulse 56  Ht 5\' 7"  (1.702 m)   Wt 178 lb 12.8 oz (81.103 kg)  BMI 28 kg/m2 General: Well-developed, well-nourished, no acute distress HEENT: Sclerae are anicteric, conjunctiva pink. Oral mucosa intact Lungs: Clear Heart: Regular Abdomen: soft, nontender, nondistended, no obvious ascites, no peritoneal signs, normal bowel sounds. No organomegaly. Extremities: No edema Psychiatric: alert and oriented x3. Cooperative     ASSESSMENT:  #1. Recent problems with abdominal pain secondary to NSAID-induced gastritis/ulcer. Improved on PPI #2. GERD with esophagitis on endoscopy. Improve with PPI #3. Diverticulosis on colonoscopy in 2005   PLAN:  #1. Reflux precautions #2. Continue PPI #3. Minimize NSAID exposure #4. Return to the care of Dr. Jacky Kindle. GI followup as needed

## 2012-05-26 DIAGNOSIS — H4011X0 Primary open-angle glaucoma, stage unspecified: Secondary | ICD-10-CM | POA: Diagnosis not present

## 2012-05-26 DIAGNOSIS — H409 Unspecified glaucoma: Secondary | ICD-10-CM | POA: Diagnosis not present

## 2012-06-03 DIAGNOSIS — M549 Dorsalgia, unspecified: Secondary | ICD-10-CM | POA: Diagnosis not present

## 2012-06-23 DIAGNOSIS — I10 Essential (primary) hypertension: Secondary | ICD-10-CM | POA: Diagnosis not present

## 2012-06-23 DIAGNOSIS — M1990 Unspecified osteoarthritis, unspecified site: Secondary | ICD-10-CM | POA: Diagnosis not present

## 2012-06-23 DIAGNOSIS — M47817 Spondylosis without myelopathy or radiculopathy, lumbosacral region: Secondary | ICD-10-CM | POA: Diagnosis not present

## 2012-06-23 DIAGNOSIS — G609 Hereditary and idiopathic neuropathy, unspecified: Secondary | ICD-10-CM | POA: Diagnosis not present

## 2012-10-12 DIAGNOSIS — H1130 Conjunctival hemorrhage, unspecified eye: Secondary | ICD-10-CM | POA: Diagnosis not present

## 2012-10-17 DIAGNOSIS — R109 Unspecified abdominal pain: Secondary | ICD-10-CM | POA: Diagnosis not present

## 2012-10-17 DIAGNOSIS — Z23 Encounter for immunization: Secondary | ICD-10-CM | POA: Diagnosis not present

## 2012-10-17 DIAGNOSIS — I10 Essential (primary) hypertension: Secondary | ICD-10-CM | POA: Diagnosis not present

## 2012-10-17 DIAGNOSIS — R197 Diarrhea, unspecified: Secondary | ICD-10-CM | POA: Diagnosis not present

## 2012-10-17 DIAGNOSIS — S0003XA Contusion of scalp, initial encounter: Secondary | ICD-10-CM | POA: Diagnosis not present

## 2012-10-31 ENCOUNTER — Encounter (INDEPENDENT_AMBULATORY_CARE_PROVIDER_SITE_OTHER): Payer: Medicare Other | Admitting: Ophthalmology

## 2012-10-31 DIAGNOSIS — H26499 Other secondary cataract, unspecified eye: Secondary | ICD-10-CM

## 2012-10-31 DIAGNOSIS — H35039 Hypertensive retinopathy, unspecified eye: Secondary | ICD-10-CM | POA: Diagnosis not present

## 2012-10-31 DIAGNOSIS — I10 Essential (primary) hypertension: Secondary | ICD-10-CM

## 2012-10-31 DIAGNOSIS — H43819 Vitreous degeneration, unspecified eye: Secondary | ICD-10-CM | POA: Diagnosis not present

## 2012-10-31 DIAGNOSIS — H33309 Unspecified retinal break, unspecified eye: Secondary | ICD-10-CM

## 2012-11-10 DIAGNOSIS — L821 Other seborrheic keratosis: Secondary | ICD-10-CM | POA: Diagnosis not present

## 2012-11-14 ENCOUNTER — Encounter (INDEPENDENT_AMBULATORY_CARE_PROVIDER_SITE_OTHER): Payer: Self-pay | Admitting: Ophthalmology

## 2012-11-14 DIAGNOSIS — H33309 Unspecified retinal break, unspecified eye: Secondary | ICD-10-CM

## 2012-12-21 DIAGNOSIS — H4010X0 Unspecified open-angle glaucoma, stage unspecified: Secondary | ICD-10-CM | POA: Diagnosis not present

## 2012-12-21 DIAGNOSIS — H26499 Other secondary cataract, unspecified eye: Secondary | ICD-10-CM | POA: Diagnosis not present

## 2012-12-21 DIAGNOSIS — H4011X0 Primary open-angle glaucoma, stage unspecified: Secondary | ICD-10-CM | POA: Diagnosis not present

## 2012-12-22 DIAGNOSIS — E785 Hyperlipidemia, unspecified: Secondary | ICD-10-CM | POA: Diagnosis not present

## 2012-12-22 DIAGNOSIS — I10 Essential (primary) hypertension: Secondary | ICD-10-CM | POA: Diagnosis not present

## 2012-12-29 DIAGNOSIS — F329 Major depressive disorder, single episode, unspecified: Secondary | ICD-10-CM | POA: Diagnosis not present

## 2012-12-29 DIAGNOSIS — Z1331 Encounter for screening for depression: Secondary | ICD-10-CM | POA: Diagnosis not present

## 2012-12-29 DIAGNOSIS — I10 Essential (primary) hypertension: Secondary | ICD-10-CM | POA: Diagnosis not present

## 2012-12-29 DIAGNOSIS — Z23 Encounter for immunization: Secondary | ICD-10-CM | POA: Diagnosis not present

## 2012-12-29 DIAGNOSIS — E785 Hyperlipidemia, unspecified: Secondary | ICD-10-CM | POA: Diagnosis not present

## 2012-12-29 DIAGNOSIS — Z0000 Encounter for general adult medical examination without abnormal findings: Secondary | ICD-10-CM | POA: Diagnosis not present

## 2012-12-29 DIAGNOSIS — G609 Hereditary and idiopathic neuropathy, unspecified: Secondary | ICD-10-CM | POA: Diagnosis not present

## 2012-12-29 DIAGNOSIS — M47817 Spondylosis without myelopathy or radiculopathy, lumbosacral region: Secondary | ICD-10-CM | POA: Diagnosis not present

## 2012-12-29 DIAGNOSIS — M1990 Unspecified osteoarthritis, unspecified site: Secondary | ICD-10-CM | POA: Diagnosis not present

## 2013-01-27 DIAGNOSIS — H4010X0 Unspecified open-angle glaucoma, stage unspecified: Secondary | ICD-10-CM | POA: Diagnosis not present

## 2013-01-27 DIAGNOSIS — Z961 Presence of intraocular lens: Secondary | ICD-10-CM | POA: Diagnosis not present

## 2013-01-27 DIAGNOSIS — H26499 Other secondary cataract, unspecified eye: Secondary | ICD-10-CM | POA: Diagnosis not present

## 2013-01-27 DIAGNOSIS — H43819 Vitreous degeneration, unspecified eye: Secondary | ICD-10-CM | POA: Diagnosis not present

## 2013-01-27 DIAGNOSIS — H02409 Unspecified ptosis of unspecified eyelid: Secondary | ICD-10-CM | POA: Diagnosis not present

## 2013-01-27 DIAGNOSIS — H18419 Arcus senilis, unspecified eye: Secondary | ICD-10-CM | POA: Diagnosis not present

## 2013-02-02 DIAGNOSIS — Z961 Presence of intraocular lens: Secondary | ICD-10-CM | POA: Diagnosis not present

## 2013-02-14 DIAGNOSIS — S022XXA Fracture of nasal bones, initial encounter for closed fracture: Secondary | ICD-10-CM | POA: Diagnosis not present

## 2013-02-14 DIAGNOSIS — S0003XA Contusion of scalp, initial encounter: Secondary | ICD-10-CM | POA: Diagnosis not present

## 2013-02-14 DIAGNOSIS — S0993XA Unspecified injury of face, initial encounter: Secondary | ICD-10-CM | POA: Diagnosis not present

## 2013-02-14 DIAGNOSIS — I517 Cardiomegaly: Secondary | ICD-10-CM | POA: Diagnosis not present

## 2013-02-14 DIAGNOSIS — R059 Cough, unspecified: Secondary | ICD-10-CM | POA: Diagnosis not present

## 2013-02-14 DIAGNOSIS — R51 Headache: Secondary | ICD-10-CM | POA: Diagnosis not present

## 2013-02-14 DIAGNOSIS — S199XXA Unspecified injury of neck, initial encounter: Secondary | ICD-10-CM | POA: Diagnosis not present

## 2013-02-14 DIAGNOSIS — S0990XA Unspecified injury of head, initial encounter: Secondary | ICD-10-CM | POA: Diagnosis not present

## 2013-02-14 DIAGNOSIS — J069 Acute upper respiratory infection, unspecified: Secondary | ICD-10-CM | POA: Diagnosis not present

## 2013-02-14 DIAGNOSIS — S298XXA Other specified injuries of thorax, initial encounter: Secondary | ICD-10-CM | POA: Diagnosis not present

## 2013-02-14 DIAGNOSIS — Z6827 Body mass index (BMI) 27.0-27.9, adult: Secondary | ICD-10-CM | POA: Diagnosis not present

## 2013-02-20 DIAGNOSIS — R059 Cough, unspecified: Secondary | ICD-10-CM | POA: Diagnosis not present

## 2013-02-20 DIAGNOSIS — J387 Other diseases of larynx: Secondary | ICD-10-CM | POA: Diagnosis not present

## 2013-02-20 DIAGNOSIS — R05 Cough: Secondary | ICD-10-CM | POA: Diagnosis not present

## 2013-02-20 DIAGNOSIS — S022XXA Fracture of nasal bones, initial encounter for closed fracture: Secondary | ICD-10-CM | POA: Diagnosis not present

## 2013-03-22 ENCOUNTER — Ambulatory Visit (INDEPENDENT_AMBULATORY_CARE_PROVIDER_SITE_OTHER): Payer: Medicare Other | Admitting: Ophthalmology

## 2013-03-24 ENCOUNTER — Ambulatory Visit (INDEPENDENT_AMBULATORY_CARE_PROVIDER_SITE_OTHER): Payer: Medicare Other | Admitting: Ophthalmology

## 2013-04-04 ENCOUNTER — Other Ambulatory Visit: Payer: Self-pay | Admitting: Internal Medicine

## 2013-04-20 DIAGNOSIS — H409 Unspecified glaucoma: Secondary | ICD-10-CM | POA: Diagnosis not present

## 2013-04-20 DIAGNOSIS — H4011X0 Primary open-angle glaucoma, stage unspecified: Secondary | ICD-10-CM | POA: Diagnosis not present

## 2013-05-02 ENCOUNTER — Other Ambulatory Visit: Payer: Self-pay | Admitting: Internal Medicine

## 2013-06-28 DIAGNOSIS — H409 Unspecified glaucoma: Secondary | ICD-10-CM | POA: Diagnosis not present

## 2013-06-28 DIAGNOSIS — H4011X0 Primary open-angle glaucoma, stage unspecified: Secondary | ICD-10-CM | POA: Diagnosis not present

## 2013-08-09 ENCOUNTER — Other Ambulatory Visit: Payer: Self-pay | Admitting: Internal Medicine

## 2013-10-12 DIAGNOSIS — Z23 Encounter for immunization: Secondary | ICD-10-CM | POA: Diagnosis not present

## 2013-10-12 DIAGNOSIS — R059 Cough, unspecified: Secondary | ICD-10-CM | POA: Diagnosis not present

## 2013-10-12 DIAGNOSIS — K625 Hemorrhage of anus and rectum: Secondary | ICD-10-CM | POA: Diagnosis not present

## 2013-10-12 DIAGNOSIS — Z6827 Body mass index (BMI) 27.0-27.9, adult: Secondary | ICD-10-CM | POA: Diagnosis not present

## 2013-10-12 DIAGNOSIS — I10 Essential (primary) hypertension: Secondary | ICD-10-CM | POA: Diagnosis not present

## 2013-10-12 DIAGNOSIS — R05 Cough: Secondary | ICD-10-CM | POA: Diagnosis not present

## 2013-10-12 DIAGNOSIS — E785 Hyperlipidemia, unspecified: Secondary | ICD-10-CM | POA: Diagnosis not present

## 2013-10-13 ENCOUNTER — Ambulatory Visit (INDEPENDENT_AMBULATORY_CARE_PROVIDER_SITE_OTHER): Payer: Medicare Other | Admitting: Gastroenterology

## 2013-10-13 ENCOUNTER — Encounter: Payer: Self-pay | Admitting: Gastroenterology

## 2013-10-13 ENCOUNTER — Telehealth: Payer: Self-pay | Admitting: Internal Medicine

## 2013-10-13 VITALS — BP 124/76 | HR 72 | Ht 66.5 in | Wt 187.2 lb

## 2013-10-13 DIAGNOSIS — K625 Hemorrhage of anus and rectum: Secondary | ICD-10-CM | POA: Diagnosis not present

## 2013-10-13 MED ORDER — OMEPRAZOLE 40 MG PO CPDR: DELAYED_RELEASE_CAPSULE | ORAL | Status: DC

## 2013-10-13 MED ORDER — PEG-KCL-NACL-NASULF-NA ASC-C 100 G PO SOLR: 1.0000 | Freq: Once | ORAL | Status: DC

## 2013-10-13 NOTE — Assessment & Plan Note (Signed)
2 episodes of painless hematochezia yesterday with no bowel movements today.  Patient has been taking Mobic regularly raising the question of an upper GI bleeding source.  Clinically this appears to be a distal colonic bleed, perhaps secondary to hemorrhoids or diverticula.  Hemoccult negative stool today and absence of bowel movements today reflect that the patient has probably stopped bleeding.  Recommendations #1 check yesterday's blood work including CBC #2 patient will be scheduled for colonoscopy with Dr. Henrene Pastor #3 increase omeprazole to 40 mg twice a day #4 hold Mobic  Patient was carefully instructed to contact the office or seek care in the emergency room if she develops recurrent bleeding

## 2013-10-13 NOTE — Addendum Note (Signed)
Addended by: Oda Kilts on: 10/13/2013 11:52 AM   Modules accepted: Orders

## 2013-10-13 NOTE — Progress Notes (Signed)
_                                                                                                                History of Present Illness:  [name] is a 80 year old white female seen as an emergency work in for rectal bleeding.  Yesterday she had 2 bloody bowel movements consisting of bright red blood mixed with the water.  Stools were dark but not black.  She has not moved her bowels today.  She denies abdominal pain but does complain of some weakness.  She had a CBC yesterday but results are not known.  She has been taking Mobic regularly.  She is also on omeprazole. Patient underwent colonoscopy in 2005 for hematochezia and was found to have diverticulosis of the left colon.   Past Medical History  Diagnosis Date  . Subdural hematoma   . Peripheral neuropathy   . Hypertension   . Spondylosis     lumbar  . Hyperlipidemia   . DDD (degenerative disc disease), cervical   . Depression   . Diverticulosis   . Arthritis   . Atrial fibrillation   . Hyperlipidemia    Past Surgical History  Procedure Laterality Date  . Back surgery    . Burr hole for subdural hematoma      x 2 hematoma  . Knee surgery Bilateral   . Vaginal hysterectomy    . Appendectomy     family history includes Breast cancer in her sister; Heart disease in her father; Ovarian cancer in her mother. There is no history of Colon cancer, Diabetes, Liver disease, or Kidney disease. Current Outpatient Prescriptions  Medication Sig Dispense Refill  . BIOTIN PO Take 1 tablet by mouth daily.      . Cyanocobalamin (VITAMIN B-12 PO) Take 1 tablet by mouth daily.      Marland Kitchen escitalopram (LEXAPRO) 20 MG tablet Take 20 mg by mouth daily.      Marland Kitchen latanoprost (XALATAN) 0.005 % ophthalmic solution Place 1 drop into both eyes at bedtime.      Marland Kitchen losartan (COZAAR) 25 MG tablet Take 25 mg by mouth 2 (two) times daily.      Marland Kitchen MAGNESIUM PO Take 1 tablet by mouth at bedtime.      . metoprolol (LOPRESSOR) 100 MG tablet  Take 50 mg by mouth 2 (two) times daily.      . Nutritional Supplements (MELATONIN PO) Take 1 tablet by mouth at bedtime.      Marland Kitchen omeprazole (PRILOSEC) 40 MG capsule TAKE ONE CAPSULE BY MOUTH DAILY  30 capsule  3  . simvastatin (ZOCOR) 20 MG tablet Take 20 mg by mouth every evening.      . zolpidem (AMBIEN) 10 MG tablet Take 10 mg by mouth at bedtime as needed. For insomnia.       No current facility-administered medications for this visit.   Allergies as of 10/13/2013 - Review Complete 10/13/2013  Allergen Reaction Noted  . Codeine Other (See Comments) 07/06/2011  reports that she has never smoked. She has never used smokeless tobacco. She reports that she drinks about 8.4 ounces of alcohol per week. She reports that she does not use illicit drugs.   Review of Systems: Pertinent positive and negative review of systems were noted in the above HPI section. All other review of systems were otherwise negative.  Vital signs were reviewed in today's medical record Physical Exam: General: Well developed , well nourished, no acute distress Skin: anicteric Head: Normocephalic and atraumatic Eyes:  sclerae anicteric, EOMI Ears: Normal auditory acuity Mouth: No deformity or lesions Neck: Supple, no masses or thyromegaly Lungs: Clear throughout to auscultation Heart: Regular rate and rhythm; no murmurs, rubs or bruits Abdomen: Soft, non tender and non distended. No masses, hepatosplenomegaly or hernias noted. Normal Bowel sounds Rectal: There are no rectal masses.  Stool is brown and Hemoccult negative Musculoskeletal: Symmetrical with no gross deformities  Skin: No lesions on visible extremities Pulses:  Normal pulses noted Extremities: No clubbing, cyanosis, edema or deformities noted Neurological: Alert oriented x 4, grossly nonfocal Cervical Nodes:  No significant cervical adenopathy Inguinal Nodes: No significant inguinal adenopathy Psychological:  Alert and cooperative. Normal mood  and affect  See Assessment and Plan under Problem List

## 2013-10-13 NOTE — Telephone Encounter (Signed)
Scheduled today at 11:15 AM with Dr. Deatra Ina. Kim notified. Kim to fax records.

## 2013-10-13 NOTE — Patient Instructions (Signed)
You have been scheduled for a colonoscopy. Please follow written instructions given to you at your visit today.  Please pick up your prep kit at the pharmacy within the next 1-3 days. If you use inhalers (even only as needed), please bring them with you on the day of your procedure. Your physician has requested that you go to www.startemmi.com and enter the access code given to you at your visit today. This web site gives a general overview about your procedure. However, you should still follow specific instructions given to you by our office regarding your preparation for the procedure.  If more bleeding occurs in the next 2-3 days call us or go to the Emergency Room

## 2013-10-25 DIAGNOSIS — R05 Cough: Secondary | ICD-10-CM | POA: Diagnosis not present

## 2013-10-25 DIAGNOSIS — I10 Essential (primary) hypertension: Secondary | ICD-10-CM | POA: Diagnosis not present

## 2013-10-25 DIAGNOSIS — M1990 Unspecified osteoarthritis, unspecified site: Secondary | ICD-10-CM | POA: Diagnosis not present

## 2013-10-25 DIAGNOSIS — R5381 Other malaise: Secondary | ICD-10-CM | POA: Diagnosis not present

## 2013-10-25 DIAGNOSIS — D510 Vitamin B12 deficiency anemia due to intrinsic factor deficiency: Secondary | ICD-10-CM | POA: Diagnosis not present

## 2013-10-25 DIAGNOSIS — K625 Hemorrhage of anus and rectum: Secondary | ICD-10-CM | POA: Diagnosis not present

## 2013-10-25 DIAGNOSIS — Z6827 Body mass index (BMI) 27.0-27.9, adult: Secondary | ICD-10-CM | POA: Diagnosis not present

## 2013-11-16 ENCOUNTER — Encounter: Payer: Self-pay | Admitting: Internal Medicine

## 2013-11-16 ENCOUNTER — Ambulatory Visit (AMBULATORY_SURGERY_CENTER): Payer: Medicare Other | Admitting: Internal Medicine

## 2013-11-16 VITALS — BP 155/96 | HR 74 | Temp 97.1°F | Resp 17 | Ht 66.5 in | Wt 187.0 lb

## 2013-11-16 DIAGNOSIS — D122 Benign neoplasm of ascending colon: Secondary | ICD-10-CM

## 2013-11-16 DIAGNOSIS — D120 Benign neoplasm of cecum: Secondary | ICD-10-CM

## 2013-11-16 DIAGNOSIS — Z886 Allergy status to analgesic agent status: Secondary | ICD-10-CM | POA: Diagnosis not present

## 2013-11-16 DIAGNOSIS — I4891 Unspecified atrial fibrillation: Secondary | ICD-10-CM | POA: Diagnosis not present

## 2013-11-16 DIAGNOSIS — I10 Essential (primary) hypertension: Secondary | ICD-10-CM | POA: Diagnosis not present

## 2013-11-16 DIAGNOSIS — Z1211 Encounter for screening for malignant neoplasm of colon: Secondary | ICD-10-CM

## 2013-11-16 DIAGNOSIS — F329 Major depressive disorder, single episode, unspecified: Secondary | ICD-10-CM | POA: Diagnosis not present

## 2013-11-16 DIAGNOSIS — K625 Hemorrhage of anus and rectum: Secondary | ICD-10-CM

## 2013-11-16 DIAGNOSIS — E669 Obesity, unspecified: Secondary | ICD-10-CM | POA: Diagnosis not present

## 2013-11-16 MED ORDER — SODIUM CHLORIDE 0.9 % IV SOLN: 500.0000 mL | INTRAVENOUS | Status: DC

## 2013-11-16 NOTE — Patient Instructions (Addendum)
Discharge instructions given. Handouts on polyps,diverticulosis and a high fiber diet. Resume previous medications. YOU HAD AN ENDOSCOPIC PROCEDURE TODAY AT THE [facility]: Refer to the procedure report that was given to you for any specific questions about what was found during the examination.  If the procedure report does not answer your questions, please call your gastroenterologist to clarify.  If you requested that your care partner not be given the details of your procedure findings, then the procedure report has been included in a sealed envelope for you to review at your convenience later.  YOU SHOULD EXPECT: Some feelings of bloating in the abdomen. Passage of more gas than usual.  Walking can help get rid of the air that was put into your GI tract during the procedure and reduce the bloating. If you had a lower endoscopy (such as a colonoscopy or flexible sigmoidoscopy) you may notice spotting of blood in your stool or on the toilet paper. If you underwent a bowel prep for your procedure, then you may not have a normal bowel movement for a few days.  DIET: Your first meal following the procedure should be a light meal and then it is ok to progress to your normal diet.  A half-sandwich or bowl of soup is an example of a good first meal.  Heavy or fried foods are harder to digest and may make you feel nauseous or bloated.  Likewise meals heavy in dairy and vegetables can cause extra gas to form and this can also increase the bloating.  Drink plenty of fluids but you should avoid alcoholic beverages for 24 hours.  ACTIVITY: Your care partner should take you home directly after the procedure.  You should plan to take it easy, moving slowly for the rest of the day.  You can resume normal activity the day after the procedure however you should NOT DRIVE or use heavy machinery for 24 hours (because of the sedation medicines used during the test).    SYMPTOMS TO REPORT IMMEDIATELY: A  gastroenterologist can be reached at any hour.  During normal business hours, 8:30 AM to 5:00 PM Monday through Friday, call [phone].  After hours and on weekends, please call the GI answering service at [phone] who will take a message and have the physician on call contact you.   Following lower endoscopy (colonoscopy or flexible sigmoidoscopy):  Excessive amounts of blood in the stool  Significant tenderness or worsening of abdominal pains  Swelling of the abdomen that is new, acute  Fever of 100F or higher FOLLOW UP: If any biopsies were taken you will be contacted by phone or by letter within the next 1-3 weeks.  Call your gastroenterologist if you have not heard about the biopsies in 3 weeks.  Our staff will call the home number listed on your records the next business day following your procedure to check on you and address any questions or concerns that you may have at that time regarding the information given to you following your procedure. This is a courtesy call and so if there is no answer at the home number and we have not heard from you through the emergency physician on call, we will assume that you have returned to your regular daily activities without incident.  SIGNATURES/CONFIDENTIALITY: You and/or your care partner have signed paperwork which will be entered into your electronic medical record.  These signatures attest to the fact that that the information above on your After Visit Summary has   Summary has been reviewed and is understood.  Full responsibility of the confidentiality of this discharge information lies with you and/or your care-partner. 

## 2013-11-16 NOTE — Progress Notes (Signed)
Report to PACU, RN, vss, BBS= Clear.  

## 2013-11-16 NOTE — Progress Notes (Signed)
Called to room to assist during endoscopic procedure.  Patient ID and intended procedure confirmed with present staff. Received instructions for my participation in the procedure from the performing physician.  

## 2013-11-16 NOTE — Op Note (Signed)
[facility]  [address] [place], [address]   COLONOSCOPY PROCEDURE REPORT  PATIENT: [name], [name]  MR#: [id number] BIRTHDATE: July 26, 1932 , 80  yrs. old GENDER: female ENDOSCOPIST: Eustace Quail, MD REFERRED BY:.  Self / Office PROCEDURE DATE:  11/16/2013 PROCEDURE:   Colonoscopy with snare polypectomy x 6 First Screening Colonoscopy - Avg.  risk and is 50 yrs.  old or older - No.  Prior Negative Screening - Now for repeat screening. 10 or more years since last screening  History of Adenoma - Now for follow-up colonoscopy & has been > or = to 3 yrs.  N/A  Polyps Removed Today? Yes. ASA CLASS:   Class II INDICATIONS:average risk for colorectal cancer.  . Index exam 2005 (no polyps) MEDICATIONS: Monitored anesthesia care and Propofol 300 mg IV  DESCRIPTION OF PROCEDURE:   After the risks benefits and alternatives of the procedure were thoroughly explained, informed consent was obtained.  The digital rectal exam revealed external hemorrhoids.   The LB [id number]  endoscope was introduced through the anus and advanced to the cecum, which was identified by both the appendix and ileocecal valve. No adverse events experienced.   The quality of the prep was excellent, using MoviPrep  The instrument was then slowly withdrawn as the colon was fully examined.  COLON FINDINGS: Six polyps, 3-14mm in size, were found in the ascending colon and at the cecum.  A polypectomy was performed with a cold snare.  The resection was complete, the polyp tissue was retrieved and sent to histology.  There was moderate diverticulosis noted in the left colon.   The examination was otherwise normal. Retroflexed views revealed internal hemorrhoids. The time to cecum=2 minutes 49 seconds.  Withdrawal time=14 minutes 32 seconds. The scope was withdrawn and the procedure completed. COMPLICATIONS: There were no immediate complications.  ENDOSCOPIC IMPRESSION: 1.   Six polyps were  found in the ascending colon and at the cecum; polypectomy was performed with a cold snare 2.   Moderate diverticulosis was noted in the left colon 3.   The examination was otherwise normal   RECOMMENDATIONS: 1. Return to the care of your primary provider.  GI follow up as needed  eSigned:  Eustace Quail, MD 11/16/2013 3:06 PM   cc: Burnard Bunting, MD and The Patient   PATIENT NAME:  [name], [name] MR#: [id number]

## 2013-11-17 ENCOUNTER — Telehealth: Payer: Self-pay | Admitting: *Deleted

## 2013-11-17 NOTE — Telephone Encounter (Signed)
  Follow up Call-  Call back number 11/16/2013 03/23/2012  Post procedure Call Back phone  # [phone] -  Permission to leave phone message Yes Yes     Patient questions:  Do you have a fever, pain , or abdominal swelling? No. Pain Score  0 *  Have you tolerated food without any problems? Yes.    Have you been able to return to your normal activities? Yes.    Do you have any questions about your discharge instructions: Diet   No. Medications  No. Follow up visit  No.  Do you have questions or concerns about your Care? No.  Actions: * If pain score is 4 or above: No action needed, pain <4.

## 2013-11-21 ENCOUNTER — Encounter: Payer: Self-pay | Admitting: Internal Medicine

## 2013-11-21 DIAGNOSIS — H52223 Regular astigmatism, bilateral: Secondary | ICD-10-CM | POA: Diagnosis not present

## 2013-11-21 DIAGNOSIS — H524 Presbyopia: Secondary | ICD-10-CM | POA: Diagnosis not present

## 2013-11-21 DIAGNOSIS — Z961 Presence of intraocular lens: Secondary | ICD-10-CM | POA: Diagnosis not present

## 2013-11-21 DIAGNOSIS — H5212 Myopia, left eye: Secondary | ICD-10-CM | POA: Diagnosis not present

## 2013-12-18 DIAGNOSIS — M62830 Muscle spasm of back: Secondary | ICD-10-CM | POA: Diagnosis not present

## 2013-12-18 DIAGNOSIS — K5900 Constipation, unspecified: Secondary | ICD-10-CM | POA: Diagnosis not present

## 2013-12-18 DIAGNOSIS — M545 Low back pain: Secondary | ICD-10-CM | POA: Diagnosis not present

## 2014-01-05 ENCOUNTER — Other Ambulatory Visit: Payer: Self-pay | Admitting: Gastroenterology

## 2014-03-01 DIAGNOSIS — R8290 Unspecified abnormal findings in urine: Secondary | ICD-10-CM | POA: Diagnosis not present

## 2014-03-01 DIAGNOSIS — E785 Hyperlipidemia, unspecified: Secondary | ICD-10-CM | POA: Diagnosis not present

## 2014-03-01 DIAGNOSIS — N390 Urinary tract infection, site not specified: Secondary | ICD-10-CM | POA: Diagnosis not present

## 2014-03-01 DIAGNOSIS — I10 Essential (primary) hypertension: Secondary | ICD-10-CM | POA: Diagnosis not present

## 2014-03-08 DIAGNOSIS — I10 Essential (primary) hypertension: Secondary | ICD-10-CM | POA: Diagnosis not present

## 2014-03-08 DIAGNOSIS — Z1389 Encounter for screening for other disorder: Secondary | ICD-10-CM | POA: Diagnosis not present

## 2014-03-08 DIAGNOSIS — F329 Major depressive disorder, single episode, unspecified: Secondary | ICD-10-CM | POA: Diagnosis not present

## 2014-03-08 DIAGNOSIS — Z0000 Encounter for general adult medical examination without abnormal findings: Secondary | ICD-10-CM | POA: Diagnosis not present

## 2014-03-08 DIAGNOSIS — G629 Polyneuropathy, unspecified: Secondary | ICD-10-CM | POA: Diagnosis not present

## 2014-03-08 DIAGNOSIS — M1990 Unspecified osteoarthritis, unspecified site: Secondary | ICD-10-CM | POA: Diagnosis not present

## 2014-03-08 DIAGNOSIS — M47817 Spondylosis without myelopathy or radiculopathy, lumbosacral region: Secondary | ICD-10-CM | POA: Diagnosis not present

## 2014-03-08 DIAGNOSIS — E785 Hyperlipidemia, unspecified: Secondary | ICD-10-CM | POA: Diagnosis not present

## 2014-03-08 DIAGNOSIS — Z6829 Body mass index (BMI) 29.0-29.9, adult: Secondary | ICD-10-CM | POA: Diagnosis not present

## 2014-03-09 DIAGNOSIS — Z1212 Encounter for screening for malignant neoplasm of rectum: Secondary | ICD-10-CM | POA: Diagnosis not present

## 2014-03-13 ENCOUNTER — Other Ambulatory Visit: Payer: Self-pay | Admitting: Internal Medicine

## 2014-03-13 DIAGNOSIS — M47816 Spondylosis without myelopathy or radiculopathy, lumbar region: Secondary | ICD-10-CM

## 2014-03-14 ENCOUNTER — Other Ambulatory Visit: Payer: Self-pay | Admitting: Internal Medicine

## 2014-03-29 DIAGNOSIS — H4011X1 Primary open-angle glaucoma, mild stage: Secondary | ICD-10-CM | POA: Diagnosis not present

## 2014-03-29 DIAGNOSIS — Q150 Congenital glaucoma: Secondary | ICD-10-CM | POA: Diagnosis not present

## 2014-04-02 ENCOUNTER — Ambulatory Visit
Admission: RE | Admit: 2014-04-02 | Discharge: 2014-04-02 | Disposition: A | Discharge status: Home / Self Care | Payer: Medicare Other | Source: Ambulatory Visit | Attending: Internal Medicine | Admitting: Internal Medicine

## 2014-04-02 DIAGNOSIS — M5137 Other intervertebral disc degeneration, lumbosacral region: Secondary | ICD-10-CM | POA: Diagnosis not present

## 2014-04-02 DIAGNOSIS — M545 Low back pain: Secondary | ICD-10-CM | POA: Diagnosis not present

## 2014-04-02 DIAGNOSIS — M47816 Spondylosis without myelopathy or radiculopathy, lumbar region: Secondary | ICD-10-CM

## 2014-04-02 DIAGNOSIS — M47817 Spondylosis without myelopathy or radiculopathy, lumbosacral region: Secondary | ICD-10-CM | POA: Diagnosis not present

## 2014-04-02 MED ORDER — IOHEXOL 180 MG/ML  SOLN
1.0000 mL | Freq: Once | INTRAMUSCULAR | Status: AC | PRN
  Administered 2014-04-02: 1 mL via EPIDURAL

## 2014-04-02 MED ORDER — METHYLPREDNISOLONE ACETATE 40 MG/ML INJ SUSP (RADIOLOG
120.0000 mg | Freq: Once | INTRAMUSCULAR | Status: AC
  Administered 2014-04-02: 120 mg via EPIDURAL

## 2014-04-02 NOTE — Discharge Instructions (Signed)

## 2014-04-05 DIAGNOSIS — M2550 Pain in unspecified joint: Secondary | ICD-10-CM | POA: Diagnosis not present

## 2014-04-05 DIAGNOSIS — M79643 Pain in unspecified hand: Secondary | ICD-10-CM | POA: Diagnosis not present

## 2014-04-05 DIAGNOSIS — R5383 Other fatigue: Secondary | ICD-10-CM | POA: Diagnosis not present

## 2014-04-05 DIAGNOSIS — M1990 Unspecified osteoarthritis, unspecified site: Secondary | ICD-10-CM | POA: Diagnosis not present

## 2014-04-05 DIAGNOSIS — M19049 Primary osteoarthritis, unspecified hand: Secondary | ICD-10-CM | POA: Diagnosis not present

## 2014-04-05 DIAGNOSIS — M545 Low back pain: Secondary | ICD-10-CM | POA: Diagnosis not present

## 2014-04-26 DIAGNOSIS — M154 Erosive (osteo)arthritis: Secondary | ICD-10-CM | POA: Diagnosis not present

## 2014-04-26 DIAGNOSIS — M2550 Pain in unspecified joint: Secondary | ICD-10-CM | POA: Diagnosis not present

## 2014-04-26 DIAGNOSIS — M545 Low back pain: Secondary | ICD-10-CM | POA: Diagnosis not present

## 2014-04-26 DIAGNOSIS — M1990 Unspecified osteoarthritis, unspecified site: Secondary | ICD-10-CM | POA: Diagnosis not present

## 2014-05-18 ENCOUNTER — Other Ambulatory Visit: Payer: Self-pay | Admitting: Dermatology

## 2014-05-18 DIAGNOSIS — L918 Other hypertrophic disorders of the skin: Secondary | ICD-10-CM | POA: Diagnosis not present

## 2014-05-18 DIAGNOSIS — L821 Other seborrheic keratosis: Secondary | ICD-10-CM | POA: Diagnosis not present

## 2014-05-18 DIAGNOSIS — L820 Inflamed seborrheic keratosis: Secondary | ICD-10-CM | POA: Diagnosis not present

## 2014-05-18 DIAGNOSIS — D485 Neoplasm of uncertain behavior of skin: Secondary | ICD-10-CM | POA: Diagnosis not present

## 2014-05-18 DIAGNOSIS — B078 Other viral warts: Secondary | ICD-10-CM | POA: Diagnosis not present

## 2014-05-18 DIAGNOSIS — D225 Melanocytic nevi of trunk: Secondary | ICD-10-CM | POA: Diagnosis not present

## 2014-05-23 DIAGNOSIS — M19042 Primary osteoarthritis, left hand: Secondary | ICD-10-CM | POA: Diagnosis not present

## 2014-05-23 DIAGNOSIS — M1812 Unilateral primary osteoarthritis of first carpometacarpal joint, left hand: Secondary | ICD-10-CM | POA: Diagnosis not present

## 2014-05-23 DIAGNOSIS — M1811 Unilateral primary osteoarthritis of first carpometacarpal joint, right hand: Secondary | ICD-10-CM | POA: Diagnosis not present

## 2014-05-23 DIAGNOSIS — G5601 Carpal tunnel syndrome, right upper limb: Secondary | ICD-10-CM | POA: Diagnosis not present

## 2014-05-23 DIAGNOSIS — M19041 Primary osteoarthritis, right hand: Secondary | ICD-10-CM | POA: Diagnosis not present

## 2014-05-23 DIAGNOSIS — G5602 Carpal tunnel syndrome, left upper limb: Secondary | ICD-10-CM | POA: Diagnosis not present

## 2014-06-06 DIAGNOSIS — G5601 Carpal tunnel syndrome, right upper limb: Secondary | ICD-10-CM | POA: Diagnosis not present

## 2014-06-06 DIAGNOSIS — M1812 Unilateral primary osteoarthritis of first carpometacarpal joint, left hand: Secondary | ICD-10-CM | POA: Diagnosis not present

## 2014-06-06 DIAGNOSIS — M1811 Unilateral primary osteoarthritis of first carpometacarpal joint, right hand: Secondary | ICD-10-CM | POA: Diagnosis not present

## 2014-06-06 DIAGNOSIS — G5602 Carpal tunnel syndrome, left upper limb: Secondary | ICD-10-CM | POA: Diagnosis not present

## 2014-06-20 ENCOUNTER — Other Ambulatory Visit: Payer: Self-pay | Admitting: Gastroenterology

## 2014-07-20 DIAGNOSIS — D0471 Carcinoma in situ of skin of right lower limb, including hip: Secondary | ICD-10-CM | POA: Diagnosis not present

## 2014-07-20 DIAGNOSIS — C44722 Squamous cell carcinoma of skin of right lower limb, including hip: Secondary | ICD-10-CM | POA: Diagnosis not present

## 2014-07-20 DIAGNOSIS — D485 Neoplasm of uncertain behavior of skin: Secondary | ICD-10-CM | POA: Diagnosis not present

## 2014-09-10 DIAGNOSIS — E785 Hyperlipidemia, unspecified: Secondary | ICD-10-CM | POA: Diagnosis not present

## 2014-09-10 DIAGNOSIS — G629 Polyneuropathy, unspecified: Secondary | ICD-10-CM | POA: Diagnosis not present

## 2014-09-10 DIAGNOSIS — F329 Major depressive disorder, single episode, unspecified: Secondary | ICD-10-CM | POA: Diagnosis not present

## 2014-09-10 DIAGNOSIS — R05 Cough: Secondary | ICD-10-CM | POA: Diagnosis not present

## 2014-09-10 DIAGNOSIS — Z6828 Body mass index (BMI) 28.0-28.9, adult: Secondary | ICD-10-CM | POA: Diagnosis not present

## 2014-09-10 DIAGNOSIS — I10 Essential (primary) hypertension: Secondary | ICD-10-CM | POA: Diagnosis not present

## 2014-09-10 DIAGNOSIS — M1990 Unspecified osteoarthritis, unspecified site: Secondary | ICD-10-CM | POA: Diagnosis not present

## 2014-09-21 DIAGNOSIS — Z23 Encounter for immunization: Secondary | ICD-10-CM | POA: Diagnosis not present

## 2014-09-30 ENCOUNTER — Other Ambulatory Visit: Payer: Self-pay | Admitting: Internal Medicine

## 2014-10-01 ENCOUNTER — Other Ambulatory Visit: Payer: Self-pay | Admitting: Internal Medicine

## 2014-10-01 DIAGNOSIS — I10 Essential (primary) hypertension: Secondary | ICD-10-CM | POA: Diagnosis not present

## 2014-10-03 ENCOUNTER — Telehealth: Payer: Self-pay | Admitting: Cardiovascular Disease

## 2014-10-03 DIAGNOSIS — I10 Essential (primary) hypertension: Secondary | ICD-10-CM | POA: Diagnosis not present

## 2014-10-03 DIAGNOSIS — R42 Dizziness and giddiness: Secondary | ICD-10-CM | POA: Diagnosis not present

## 2014-10-03 DIAGNOSIS — F329 Major depressive disorder, single episode, unspecified: Secondary | ICD-10-CM | POA: Diagnosis not present

## 2014-10-03 NOTE — Telephone Encounter (Signed)
Records red from [facility] placed in chart prep bin for 10/30/14 appointment with Dr.Nahser

## 2014-10-11 DIAGNOSIS — H4011X2 Primary open-angle glaucoma, moderate stage: Secondary | ICD-10-CM | POA: Diagnosis not present

## 2014-10-16 ENCOUNTER — Encounter: Payer: Self-pay | Admitting: Neurology

## 2014-10-16 ENCOUNTER — Ambulatory Visit (INDEPENDENT_AMBULATORY_CARE_PROVIDER_SITE_OTHER): Payer: Medicare Other | Admitting: Neurology

## 2014-10-16 VITALS — BP 160/82 | HR 93 | Ht 66.5 in | Wt 175.2 lb

## 2014-10-16 DIAGNOSIS — S134XXA Sprain of ligaments of cervical spine, initial encounter: Secondary | ICD-10-CM

## 2014-10-16 DIAGNOSIS — E538 Deficiency of other specified B group vitamins: Secondary | ICD-10-CM

## 2014-10-16 DIAGNOSIS — R413 Other amnesia: Secondary | ICD-10-CM

## 2014-10-16 NOTE — Patient Instructions (Signed)
Overall you are doing fairly well but I do want to suggest a few things today:   Remember to drink plenty of fluid, eat healthy meals and do not skip any meals. Try to eat protein with a every meal and eat a healthy snack such as fruit or nuts in between meals. Try to keep a regular sleep-wake schedule and try to exercise daily, particularly in the form of walking, 20-30 minutes a day, if you can.   As far as diagnostic testing: MRi of the brain, labs  I would like to see you back as needed, sooner if we need to. Please call us with any interim questions, concerns, problems, updates or refill requests.   Our phone number is [phone]. We also have an after hours call service for urgent matters and there is a physician on-call for urgent questions. For any emergencies you know to call 911 or go to the nearest emergency room
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
## 2014-10-17 ENCOUNTER — Telehealth: Payer: Self-pay | Admitting: *Deleted

## 2014-10-17 LAB — B12 AND FOLATE PANEL
Folate: 11.3 ng/mL (ref >3.0)
Vitamin B-12: 771 pg/mL (ref 211–946)

## 2014-10-17 LAB — TSH: TSH: 0.878 u[IU]/mL (ref 0.450–4.500)

## 2014-10-17 NOTE — Telephone Encounter (Signed)
Called and spoke to pt about normal lab work. She verbalized understanding.

## 2014-10-17 NOTE — Telephone Encounter (Signed)
-----   Message from Melvenia Beam, MD sent at 10/17/2014  7:21 AM EDT ----- Please let patient know her labs were normal. thanks

## 2014-10-30 ENCOUNTER — Encounter: Payer: Self-pay | Admitting: Cardiovascular Disease

## 2014-10-30 ENCOUNTER — Ambulatory Visit (INDEPENDENT_AMBULATORY_CARE_PROVIDER_SITE_OTHER): Payer: Medicare Other | Admitting: Cardiovascular Disease

## 2014-10-30 VITALS — BP 110/80 | HR 91 | Ht 66.5 in | Wt 182.8 lb

## 2014-10-30 DIAGNOSIS — I4891 Unspecified atrial fibrillation: Secondary | ICD-10-CM | POA: Insufficient documentation

## 2014-10-30 DIAGNOSIS — I4819 Other persistent atrial fibrillation: Secondary | ICD-10-CM

## 2014-10-30 DIAGNOSIS — I481 Persistent atrial fibrillation: Secondary | ICD-10-CM | POA: Diagnosis not present
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
## 2014-10-30 NOTE — Patient Instructions (Signed)
Medication Instructions:  Your physician recommends that you continue on your current medications as directed. Please refer to the Current Medication list given to you today.   Labwork: None Ordered   Testing/Procedures: Your physician has requested that you have an echocardiogram. Echocardiography is a painless test that uses sound waves to create images of your heart. It provides your doctor with information about the size and shape of your heart and how well your heart's chambers and valves are working. This procedure takes approximately one hour. There are no restrictions for this procedure.   Follow-Up: Your physician wants you to follow-up in: 6 months with Dr. Nahser.  You will receive a reminder letter in the mail two months in advance. If you don't receive a letter, please call our office to schedule the follow-up appointment.    

## 2014-10-31 ENCOUNTER — Telehealth: Payer: Self-pay | Admitting: Nurse Practitioner

## 2014-10-31 DIAGNOSIS — I4891 Unspecified atrial fibrillation: Secondary | ICD-10-CM

## 2014-10-31 MED ORDER — APIXABAN 5 MG PO TABS: 5.0000 mg | ORAL_TABLET | Freq: Two times a day (BID) | ORAL | Status: DC

## 2014-10-31 NOTE — Telephone Encounter (Signed)
Spoke with patient and advised her that per Dr. Acie Fredrickson:    Talked with Dr. Reynaldo Minium,   He thinks her risk of falling is low.  Will start her on Eliquis 5 mg bid ,'  Have her check BMP and CBC later this week .   She verbalized understanding and agreement.  I advised her that I am placing samples at the front desk for her to pick up.  She states she will come tomorrow for lab work and to pick up medication.

## 2014-11-01 ENCOUNTER — Telehealth: Payer: Self-pay

## 2014-11-01 ENCOUNTER — Other Ambulatory Visit: Payer: Medicare Other

## 2014-11-01 DIAGNOSIS — I481 Persistent atrial fibrillation: Secondary | ICD-10-CM | POA: Diagnosis not present

## 2014-11-01 DIAGNOSIS — I4891 Unspecified atrial fibrillation: Secondary | ICD-10-CM | POA: Diagnosis not present

## 2014-11-01 DIAGNOSIS — I4819 Other persistent atrial fibrillation: Secondary | ICD-10-CM

## 2014-11-01 LAB — CBC WITH DIFFERENTIAL/PLATELET
BASOS PCT: 0 % (ref 0–1)
Basophils Absolute: 0 10*3/uL (ref 0.0–0.1)
EOS ABS: 0.1 10*3/uL (ref 0.0–0.7)
EOS PCT: 2 % (ref 0–5)
HCT: 41.9 % (ref 36.0–46.0)
Hemoglobin: 14.2 g/dL (ref 12.0–15.0)
Lymphocytes Relative: 30 % (ref 12–46)
Lymphs Abs: 1.4 10*3/uL (ref 0.7–4.0)
MCH: 31.6 pg (ref 26.0–34.0)
MCHC: 33.9 g/dL (ref 30.0–36.0)
MCV: 93.1 fL (ref 78.0–100.0)
MONO ABS: 0.3 10*3/uL (ref 0.1–1.0)
MONOS PCT: 6 % (ref 3–12)
MPV: 9.6 fL (ref 8.6–12.4)
Neutro Abs: 2.9 10*3/uL (ref 1.7–7.7)
Neutrophils Relative %: 62 % (ref 43–77)
PLATELETS: 231 10*3/uL (ref 150–400)
RBC: 4.5 MIL/uL (ref 3.87–5.11)
RDW: 13.6 % (ref 11.5–15.5)
WBC: 4.7 10*3/uL (ref 4.0–10.5)

## 2014-11-01 LAB — BASIC METABOLIC PANEL
BUN: 20 mg/dL (ref 7–25)
CO2: 26 mmol/L (ref 20–31)
CREATININE: 1.05 mg/dL — AB (ref 0.60–0.93)
Calcium: 9.3 mg/dL (ref 8.6–10.4)
Chloride: 98 mmol/L (ref 98–110)
Glucose, Bld: 138 mg/dL — ABNORMAL HIGH (ref 65–99)
Potassium: 4.1 mmol/L (ref 3.5–5.3)
Sodium: 134 mmol/L — ABNORMAL LOW (ref 135–146)

## 2014-11-01 NOTE — Telephone Encounter (Signed)
Prior auth for Eliquis 5 mg sent to [facility].

## 2014-11-01 NOTE — Addendum Note (Signed)
Addended by: Eulis Foster on: 11/01/2014 12:05 PM   Modules accepted: Orders

## 2014-11-08 ENCOUNTER — Other Ambulatory Visit: Payer: Self-pay

## 2014-11-08 ENCOUNTER — Ambulatory Visit (HOSPITAL_COMMUNITY): Payer: Medicare Other | Attending: Cardiology

## 2014-11-08 DIAGNOSIS — I4891 Unspecified atrial fibrillation: Secondary | ICD-10-CM

## 2014-11-08 DIAGNOSIS — I517 Cardiomegaly: Secondary | ICD-10-CM | POA: Diagnosis not present

## 2014-11-08 DIAGNOSIS — I10 Essential (primary) hypertension: Secondary | ICD-10-CM | POA: Insufficient documentation

## 2014-11-08 DIAGNOSIS — I481 Persistent atrial fibrillation: Secondary | ICD-10-CM | POA: Insufficient documentation

## 2014-11-08 DIAGNOSIS — I4819 Other persistent atrial fibrillation: Secondary | ICD-10-CM

## 2014-11-08 DIAGNOSIS — I351 Nonrheumatic aortic (valve) insufficiency: Secondary | ICD-10-CM | POA: Insufficient documentation

## 2014-11-08 DIAGNOSIS — E785 Hyperlipidemia, unspecified: Secondary | ICD-10-CM | POA: Diagnosis not present

## 2014-11-20 DIAGNOSIS — H401122 Primary open-angle glaucoma, left eye, moderate stage: Secondary | ICD-10-CM | POA: Diagnosis not present

## 2014-11-20 DIAGNOSIS — Z961 Presence of intraocular lens: Secondary | ICD-10-CM | POA: Diagnosis not present

## 2014-11-20 DIAGNOSIS — H04123 Dry eye syndrome of bilateral lacrimal glands: Secondary | ICD-10-CM | POA: Diagnosis not present

## 2014-11-20 DIAGNOSIS — H401112 Primary open-angle glaucoma, right eye, moderate stage: Secondary | ICD-10-CM | POA: Diagnosis not present

## 2014-11-20 DIAGNOSIS — H5211 Myopia, right eye: Secondary | ICD-10-CM | POA: Diagnosis not present

## 2014-11-20 DIAGNOSIS — H52223 Regular astigmatism, bilateral: Secondary | ICD-10-CM | POA: Diagnosis not present

## 2014-11-23 DIAGNOSIS — Z85828 Personal history of other malignant neoplasm of skin: Secondary | ICD-10-CM | POA: Diagnosis not present

## 2014-11-23 DIAGNOSIS — D225 Melanocytic nevi of trunk: Secondary | ICD-10-CM | POA: Diagnosis not present

## 2014-11-23 DIAGNOSIS — L821 Other seborrheic keratosis: Secondary | ICD-10-CM | POA: Diagnosis not present

## 2015-01-09 ENCOUNTER — Other Ambulatory Visit: Payer: Self-pay | Admitting: Internal Medicine

## 2015-03-14 DIAGNOSIS — N390 Urinary tract infection, site not specified: Secondary | ICD-10-CM | POA: Diagnosis not present

## 2015-03-14 DIAGNOSIS — R8290 Unspecified abnormal findings in urine: Secondary | ICD-10-CM | POA: Diagnosis not present

## 2015-03-14 DIAGNOSIS — E784 Other hyperlipidemia: Secondary | ICD-10-CM | POA: Diagnosis not present

## 2015-03-14 DIAGNOSIS — I10 Essential (primary) hypertension: Secondary | ICD-10-CM | POA: Diagnosis not present

## 2015-03-21 DIAGNOSIS — R7302 Impaired glucose tolerance (oral): Secondary | ICD-10-CM | POA: Diagnosis not present

## 2015-03-21 DIAGNOSIS — R5381 Other malaise: Secondary | ICD-10-CM | POA: Diagnosis not present

## 2015-03-21 DIAGNOSIS — N183 Chronic kidney disease, stage 3 (moderate): Secondary | ICD-10-CM | POA: Diagnosis not present

## 2015-03-21 DIAGNOSIS — M47817 Spondylosis without myelopathy or radiculopathy, lumbosacral region: Secondary | ICD-10-CM | POA: Diagnosis not present

## 2015-03-21 DIAGNOSIS — R2689 Other abnormalities of gait and mobility: Secondary | ICD-10-CM | POA: Diagnosis not present

## 2015-03-21 DIAGNOSIS — E784 Other hyperlipidemia: Secondary | ICD-10-CM | POA: Diagnosis not present

## 2015-03-21 DIAGNOSIS — R413 Other amnesia: Secondary | ICD-10-CM | POA: Diagnosis not present

## 2015-03-21 DIAGNOSIS — Z6827 Body mass index (BMI) 27.0-27.9, adult: Secondary | ICD-10-CM | POA: Diagnosis not present

## 2015-03-21 DIAGNOSIS — G629 Polyneuropathy, unspecified: Secondary | ICD-10-CM | POA: Diagnosis not present

## 2015-03-21 DIAGNOSIS — Z1389 Encounter for screening for other disorder: Secondary | ICD-10-CM | POA: Diagnosis not present

## 2015-03-21 DIAGNOSIS — I4891 Unspecified atrial fibrillation: Secondary | ICD-10-CM | POA: Diagnosis not present

## 2015-03-27 ENCOUNTER — Other Ambulatory Visit: Payer: Self-pay | Admitting: Internal Medicine

## 2015-03-27 DIAGNOSIS — R2681 Unsteadiness on feet: Secondary | ICD-10-CM

## 2015-03-27 DIAGNOSIS — R413 Other amnesia: Secondary | ICD-10-CM

## 2015-04-08 ENCOUNTER — Ambulatory Visit
Admission: RE | Admit: 2015-04-08 | Discharge: 2015-04-08 | Disposition: A | Discharge status: Home / Self Care | Payer: Medicare Other | Source: Ambulatory Visit | Attending: Internal Medicine | Admitting: Internal Medicine

## 2015-04-08 DIAGNOSIS — R413 Other amnesia: Secondary | ICD-10-CM | POA: Diagnosis not present

## 2015-04-08 DIAGNOSIS — R2681 Unsteadiness on feet: Secondary | ICD-10-CM

## 2015-04-08 MED ORDER — GADOBENATE DIMEGLUMINE 529 MG/ML IV SOLN
17.0000 mL | Freq: Once | INTRAVENOUS | Status: AC | PRN
  Administered 2015-04-08: 17 mL via INTRAVENOUS

## 2015-04-09 DIAGNOSIS — Z1212 Encounter for screening for malignant neoplasm of rectum: Secondary | ICD-10-CM | POA: Diagnosis not present

## 2015-04-25 DIAGNOSIS — H401122 Primary open-angle glaucoma, left eye, moderate stage: Secondary | ICD-10-CM | POA: Diagnosis not present

## 2015-04-25 DIAGNOSIS — H401112 Primary open-angle glaucoma, right eye, moderate stage: Secondary | ICD-10-CM | POA: Diagnosis not present

## 2015-04-30 ENCOUNTER — Encounter: Payer: Self-pay | Admitting: Cardiovascular Disease

## 2015-04-30 ENCOUNTER — Ambulatory Visit (INDEPENDENT_AMBULATORY_CARE_PROVIDER_SITE_OTHER): Payer: Medicare Other | Admitting: Cardiovascular Disease

## 2015-04-30 VITALS — BP 120/80 | HR 98 | Ht 66.5 in

## 2015-04-30 DIAGNOSIS — I481 Persistent atrial fibrillation: Secondary | ICD-10-CM

## 2015-04-30 DIAGNOSIS — I4819 Other persistent atrial fibrillation: Secondary | ICD-10-CM
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
## 2015-04-30 NOTE — Patient Instructions (Signed)
Medication Instructions:  Your physician recommends that you continue on your current medications as directed. Please refer to the Current Medication list given to you today.   Labwork: None ordered  Testing/Procedures: None ordered  Follow-Up: Your physician wants you to follow-up in: 1 year with Dr. Nahser. You will receive a reminder letter in the mail two months in advance. If you don't receive a letter, please call our office to schedule the follow-up appointment.   Any Other Special Instructions Will Be Listed Below (If Applicable).     If you need a refill on your cardiac medications before your next appointment, please call your pharmacy. \  

## 2015-05-08 ENCOUNTER — Encounter: Payer: Self-pay | Admitting: Neurology

## 2015-05-08 ENCOUNTER — Ambulatory Visit (INDEPENDENT_AMBULATORY_CARE_PROVIDER_SITE_OTHER): Payer: Medicare Other | Admitting: Neurology

## 2015-05-08 VITALS — BP 164/101 | HR 73 | Ht 66.5 in | Wt 186.0 lb

## 2015-05-08 DIAGNOSIS — R413 Other amnesia: Secondary | ICD-10-CM | POA: Diagnosis not present

## 2015-05-08 DIAGNOSIS — R269 Unspecified abnormalities of gait and mobility: Secondary | ICD-10-CM | POA: Diagnosis not present

## 2015-05-08 DIAGNOSIS — W19XXXA Unspecified fall, initial encounter: Secondary | ICD-10-CM

## 2015-05-08 MED ORDER — DONEPEZIL HCL 5 MG PO TABS: 5.0000 mg | ORAL_TABLET | Freq: Every day | ORAL | Status: DC
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
## 2015-05-08 NOTE — Patient Instructions (Addendum)
Remember to drink plenty of fluid, eat healthy meals and do not skip any meals. Try to eat protein with a every meal and eat a healthy snack such as fruit or nuts in between meals. Try to keep a regular sleep-wake schedule and try to exercise daily, particularly in the form of walking, 20-30 minutes a day, if you can.   As far as your medications are concerned, I would like to suggest: Aricept 5mg  daily  As far as diagnostic testing: physical therapy, neurocognitive testing  I would like to see you back in 3 months, sooner if we need to. Please call us with any interim questions, concerns, problems, updates or refill requests.   Our phone number is [phone]. We also have an after hours call service for urgent matters and there is a physician on-call for urgent questions. For any emergencies you know to call 911 or go to the nearest emergency room

## 2015-05-13 DIAGNOSIS — M9903 Segmental and somatic dysfunction of lumbar region: Secondary | ICD-10-CM | POA: Diagnosis not present

## 2015-05-13 DIAGNOSIS — M5136 Other intervertebral disc degeneration, lumbar region: Secondary | ICD-10-CM | POA: Diagnosis not present

## 2015-05-18 DIAGNOSIS — R296 Repeated falls: Secondary | ICD-10-CM | POA: Insufficient documentation

## 2015-05-18 DIAGNOSIS — W19XXXA Unspecified fall, initial encounter: Secondary | ICD-10-CM | POA: Insufficient documentation

## 2015-05-21 ENCOUNTER — Ambulatory Visit: Payer: Medicare Other | Admitting: Physical Therapy

## 2015-05-22 DIAGNOSIS — M5136 Other intervertebral disc degeneration, lumbar region: Secondary | ICD-10-CM | POA: Diagnosis not present

## 2015-05-22 DIAGNOSIS — M9903 Segmental and somatic dysfunction of lumbar region: Secondary | ICD-10-CM | POA: Diagnosis not present

## 2015-05-27 DIAGNOSIS — M9903 Segmental and somatic dysfunction of lumbar region: Secondary | ICD-10-CM | POA: Diagnosis not present

## 2015-05-27 DIAGNOSIS — M5136 Other intervertebral disc degeneration, lumbar region: Secondary | ICD-10-CM | POA: Diagnosis not present

## 2015-05-28 DIAGNOSIS — M5136 Other intervertebral disc degeneration, lumbar region: Secondary | ICD-10-CM | POA: Diagnosis not present

## 2015-05-28 DIAGNOSIS — M9903 Segmental and somatic dysfunction of lumbar region: Secondary | ICD-10-CM | POA: Diagnosis not present

## 2015-06-03 DIAGNOSIS — M9903 Segmental and somatic dysfunction of lumbar region: Secondary | ICD-10-CM | POA: Diagnosis not present

## 2015-06-03 DIAGNOSIS — M5136 Other intervertebral disc degeneration, lumbar region: Secondary | ICD-10-CM | POA: Diagnosis not present

## 2015-06-06 DIAGNOSIS — M9903 Segmental and somatic dysfunction of lumbar region: Secondary | ICD-10-CM | POA: Diagnosis not present

## 2015-06-06 DIAGNOSIS — M5136 Other intervertebral disc degeneration, lumbar region: Secondary | ICD-10-CM | POA: Diagnosis not present

## 2015-06-10 DIAGNOSIS — M5136 Other intervertebral disc degeneration, lumbar region: Secondary | ICD-10-CM | POA: Diagnosis not present

## 2015-06-10 DIAGNOSIS — M9903 Segmental and somatic dysfunction of lumbar region: Secondary | ICD-10-CM | POA: Diagnosis not present

## 2015-06-12 ENCOUNTER — Ambulatory Visit: Payer: Medicare Other | Attending: Internal Medicine | Admitting: Physical Therapy

## 2015-06-12 DIAGNOSIS — M6281 Muscle weakness (generalized): Secondary | ICD-10-CM | POA: Diagnosis not present

## 2015-06-12 DIAGNOSIS — Z9181 History of falling: Secondary | ICD-10-CM | POA: Insufficient documentation

## 2015-06-12 DIAGNOSIS — R2689 Other abnormalities of gait and mobility: Secondary | ICD-10-CM | POA: Insufficient documentation

## 2015-06-12 NOTE — Therapy (Signed)
[facility] [address] [place], [place], [address] Phone: [phone]   Fax:  [phone]  Physical Therapy Evaluation  Patient Details  Name: [name] MRN: [id number] Date of Birth: 1932/01/31 Referring Provider: Dr. Sarina Ill  Encounter Date: 06/12/2015      PT End of Session - 06/12/15 1307    Visit Number 1   Number of Visits 8   Date for PT Re-Evaluation 07/10/15   PT Start Time 1230   PT Stop Time 1305   PT Time Calculation (min) 35 min   Equipment Utilized During Treatment Gait belt   Activity Tolerance Patient tolerated treatment well   Behavior During Therapy [facility] for tasks assessed/performed      Past Medical History  Diagnosis Date  . Subdural hematoma ([place])   . Peripheral neuropathy ([place])   . Hypertension   . Spondylosis     lumbar  . Hyperlipidemia   . DDD (degenerative disc disease), cervical   . Depression   . Diverticulosis   . Arthritis   . Atrial fibrillation ([place])   . Hyperlipidemia   . Cataract     BILATERAL  . Glaucoma   . GERD (gastroesophageal reflux disease)   . Osteoporosis   . Headache     Past Surgical History  Procedure Laterality Date  . Back surgery  2010  . Burr hole for subdural hematoma      x 2 hematoma  . Knee surgery Bilateral   . Vaginal hysterectomy    . Appendectomy    . Colonoscopy      There were no vitals filed for this visit.       Subjective Assessment - 06/12/15 1231    Subjective Pt is an 82 y/o female who presents to OPPT for hx of falls.  Pt states she is unsure why "I'm at the neurologist."  Pt did further elaborate that she has a hx of falls beginning about 6 yrs ago; and states daughter is making her come.   Pertinent History fall 6 yrs ago in [place] with SDH, peripheral neuropathy, HTN, HLD, DDD, depression, OA, afib, bil cataracts, glaucoma, osteoporosis   Limitations Walking   Patient Stated Goals "I'd be darned if I  know, you tell me"   Currently in Pain? No/denies            [facility] PT Assessment - 06/12/15 1235    Assessment   Medical Diagnosis falls, imbalance   Referring Provider Dr. Sarina Ill   Onset Date/Surgical Date --  6 yrs ago   Next MD Visit July 2017   Prior Therapy following knee surgeries   Precautions   Precautions Fall   Restrictions   Weight Bearing Restrictions No   Balance Screen   Has the patient fallen in the past 6 months Yes   How many times? 2   Has the patient had a decrease in activity level because of a fear of falling?  No   Is the patient reluctant to leave their home because of a fear of falling?  No   Home Environment   Living Environment Private residence   Living Arrangements Spouse/significant other   Type of [place] Access Level entry   [place] Two level;Bed/bath upstairs   Alternate Level Stairs-Number of Steps 14   Alternate [place] - 2 wheels;Walker - 4 wheels;Kasandra Knudsen - quad  husband uses walkers   Additional Comments  pt moving in late fall to [place]   Prior Function   Level of [place] Retired   Leisure go out to eat; belongs to a lot of clubs   Observation/Other Assessments   Focus on Therapeutic Outcomes (FOTO)  60 (40% limited; predicted 35% limited)   Activities of Balance Confidence Scale (ABC Scale)  71.3%   Strength   Strength Assessment Site Hip;Knee;Ankle   Right Hip Flexion 4/5   Left Hip Flexion 3/5   Right/Left Knee Right;Left   Right Knee Flexion 3/5   Right Knee Extension 4/5   Left Knee Flexion 3/5   Left Knee Extension 4/5   Right Ankle Dorsiflexion 4/5   Left Ankle Dorsiflexion 4/5   Ambulation/Gait   Ambulation/Gait Yes   Ambulation/Gait Assistance 5: Supervision   Ambulation Distance (Feet) 200 Feet   Assistive device None   Gait Pattern Scissoring;Trendelenburg;Right foot flat;Left foot flat   Ambulation Surface Level;Indoor    Gait velocity 3.67 ft/sec  8.94 sec   Standardized Balance Assessment   Standardized Balance Assessment Berg Balance Test;Timed Up and Go Test;Dynamic Gait Index   Berg Balance Test   Sit to Stand Able to stand without using hands and stabilize independently   Standing Unsupported Able to stand safely 2 minutes   Sitting with Back Unsupported but Feet Supported on Floor or Stool Able to sit safely and securely 2 minutes   Stand to Sit Sits safely with minimal use of hands   Transfers Able to transfer safely, minor use of hands   Standing Unsupported with Eyes Closed Able to stand 10 seconds safely   Standing Ubsupported with Feet Together Able to place feet together independently and stand 1 minute safely   From Standing, Reach Forward with Outstretched Arm Can reach forward >12 cm safely (5")   From Standing Position, Pick up Object from Floor Able to pick up shoe safely and easily   From Standing Position, Turn to Look Behind Over each Shoulder Looks behind from both sides and weight shifts well   Turn 360 Degrees Able to turn 360 degrees safely one side only in 4 seconds or less   Standing Unsupported, Alternately Place Feet on Step/Stool Able to stand independently and safely and complete 8 steps in 20 seconds   Standing Unsupported, One Foot in Front Able to plae foot ahead of the other independently and hold 30 seconds   Standing on One Leg Tries to lift leg/unable to hold 3 seconds but remains standing independently   Total Score 50   Dynamic Gait Index   Level Surface Mild Impairment   Change in Gait Speed Mild Impairment   Gait with Horizontal Head Turns Mild Impairment   Gait with Vertical Head Turns Mild Impairment   Gait and Pivot Turn Normal   Step Over Obstacle Normal   Step Around Obstacles Severe Impairment   Steps Mild Impairment   Total Score 16   Timed Up and Go Test   Normal TUG (seconds) 12.69                           PT Education -  06/12/15 1307    Education provided Yes   Education Details clinical findings, POC, goals of care   Person(s) Educated Patient   Methods Explanation   Comprehension Verbalized understanding             PT Long Term Goals - 06/12/15 1310  PT LONG TERM GOAL #1   Title verbalize understanding of fall prevention strategies (07/10/15)   Time 4   Period Weeks   Status New   PT LONG TERM GOAL #2   Title independent with HEP and establish community fitness program (07/10/15)   Time 4   Period Weeks   Status New   PT LONG TERM GOAL #3   Title improve dynamic gait index to >/= 20/24 for improved mobility and decreased fall risk (07/10/15)   Time 4   Period Weeks   Status New   PT LONG TERM GOAL #4   Title improve BERG balance score to >/= 53/56 for improved balance and decreased fall risk (07/10/15)   Time 4   Period Weeks   Status New               Plan - 06/12/15 1307    Clinical Impression Statement Pt is an 82 y/o female who presents to OPPT for moderate complexity PT evaluation of gait abnormalities and history of falls.  Pt demonstrates decreased lower extremity strength, decreased balance and gait abnormalities affecting safe functional mobility.  Pt will benefit from skilled PT to address deficits listed and help establish home and community fitness programs.   Rehab Potential Good   PT Frequency 2x / week   PT Duration 4 weeks   PT Treatment/Interventions ADLs/Self Care Home Management;Patient/family education;Neuromuscular re-education;Balance training;Therapeutic exercise;Therapeutic activities;Functional mobility training;Stair training;Gait training;DME Instruction;Vestibular   PT Next Visit Plan HEP for lower ext strengthening, balance; try gait with AD PRN   Consulted and Agree with Plan of Care Patient      Patient will benefit from skilled therapeutic intervention in order to improve the following deficits and impairments:  Abnormal gait, Decreased  balance, Decreased strength, Decreased mobility, Decreased knowledge of use of DME  Visit Diagnosis: Other abnormalities of gait and mobility - Plan: PT plan of care cert/re-cert  Muscle weakness (generalized) - Plan: PT plan of care cert/re-cert  History of falling - Plan: PT plan of care cert/re-cert      G-Codes - [id number]    Functional Assessment Tool Used DGI 16/24   Functional Limitation Mobility: Walking and moving around   Mobility: Walking and Moving Around Current Status [phone]) At least 20 percent but less than 40 percent impaired, limited or restricted   Mobility: Walking and Moving Around Goal Status [phone]) At least 1 percent but less than 20 percent impaired, limited or restricted       Problem List Patient Active Problem List   Diagnosis Date Noted  . Falls 05/18/2015  . Atrial fibrillation ([place]) 10/30/2014  . Whiplash 10/16/2014  . Memory changes 10/16/2014  . Hemorrhage of rectum and anus 10/13/2013   Laureen Abrahams, PT, DPT 06/12/2015 1:13 PM  [facility] [address] [place], [place], [address] Phone: [phone]   Fax:  [phone]  Name: [name] MRN: [id number] Date of Birth: 1932-11-04

## 2015-06-13 DIAGNOSIS — M9903 Segmental and somatic dysfunction of lumbar region: Secondary | ICD-10-CM | POA: Diagnosis not present

## 2015-06-13 DIAGNOSIS — M5136 Other intervertebral disc degeneration, lumbar region: Secondary | ICD-10-CM | POA: Diagnosis not present

## 2015-06-18 DIAGNOSIS — M9903 Segmental and somatic dysfunction of lumbar region: Secondary | ICD-10-CM | POA: Diagnosis not present

## 2015-06-18 DIAGNOSIS — M5136 Other intervertebral disc degeneration, lumbar region: Secondary | ICD-10-CM | POA: Diagnosis not present

## 2015-06-20 DIAGNOSIS — M9903 Segmental and somatic dysfunction of lumbar region: Secondary | ICD-10-CM | POA: Diagnosis not present

## 2015-06-20 DIAGNOSIS — M5136 Other intervertebral disc degeneration, lumbar region: Secondary | ICD-10-CM | POA: Diagnosis not present

## 2015-06-24 ENCOUNTER — Encounter: Payer: Self-pay | Admitting: Rehabilitation

## 2015-06-24 ENCOUNTER — Ambulatory Visit: Payer: Medicare Other | Attending: Internal Medicine | Admitting: Rehabilitation

## 2015-06-24 DIAGNOSIS — Z9181 History of falling: Secondary | ICD-10-CM | POA: Diagnosis not present

## 2015-06-24 DIAGNOSIS — R2689 Other abnormalities of gait and mobility: Secondary | ICD-10-CM | POA: Diagnosis not present

## 2015-06-24 DIAGNOSIS — M6281 Muscle weakness (generalized): Secondary | ICD-10-CM | POA: Diagnosis not present

## 2015-06-24 NOTE — Patient Instructions (Signed)
Bracing With Bridging (Hook-Lying)    With neutral spine, tighten pelvic floor and abdominals and hold. Lift bottom. Repeat _10__ times. Do _1-2__ times a day.   Copyright  VHI. All rights reserved.   Abduction: Clam (Eccentric) - Side-Lying    Lie on side with knees bent. Lift top knee, keeping feet together. Keep trunk steady. Slowly lower for 3-5 seconds. _10__ reps per set, _1-2__ sets per day, __5-7_ days per week.  http://ecce.exer.us/65   Copyright  VHI. All rights reserved.   High Stepping in Place (Standing)    Stand with support and feet together (stand next to counter top for support). Alternately lift knees as high as possible. Keep torso erect. Repeat __10__ times, each leg.  Copyright  VHI. All rights reserved.   For balance tasks, stand in corner with chair in front of you for support.    Feet Apart (Compliant Surface) Varied Arm Positions - Eyes Closed    Stand on compliant surface: __stacked pillows or couch cushion (not too thick or thin)______ with feet shoulder width apart and arms out. Close eyes and visualize upright position. Hold__30__ seconds.  KEEP ARMS BY YOUR SIDE.  Repeat __3__ times per session. Do __1-2__ sessions per day.  Copyright  VHI. All rights reserved.   Feet Together (Compliant Surface) Arm Motion - Eyes Closed    Stand on compliant surface: __stacked pillows or cushion______ with feet together. Close eyes and keep arms by your side.  Repeat _30__ times per session. Do _1-2___ sessions per day.  Copyright  VHI. All rights reserved.   SINGLE LIMB STANCE    Stand in corner with chair in front of you for support. Stance: single leg on floor. Raise leg. Hold _15__ seconds. Repeat with other leg. __3_ reps per set, _1-2__ sets per day, __5-7_ days per week  Copyright  VHI. All rights reserved.

## 2015-06-24 NOTE — Therapy (Addendum)
[facility] [address] [place], [place], [address] Phone: [phone]   Fax:  [phone]  Physical Therapy Treatment and DC Summary  Patient Details  Name: [name] MRN: [id number] Date of Birth: 01-08-1933 Referring Provider: Dr. Sarina Ill  Encounter Date: 06/24/2015      PT End of Session - 06/24/15 1115    Visit Number 2   Number of Visits 8   Date for PT Re-Evaluation 07/10/15   PT Start Time 1115  Pt arrived late   PT Stop Time 1146   PT Time Calculation (min) 31 min   Equipment Utilized During Treatment Gait belt   Activity Tolerance Patient tolerated treatment well   Behavior During Therapy [facility] for tasks assessed/performed      Past Medical History  Diagnosis Date  . Subdural hematoma ([place])   . Peripheral neuropathy ([place])   . Hypertension   . Spondylosis     lumbar  . Hyperlipidemia   . DDD (degenerative disc disease), cervical   . Depression   . Diverticulosis   . Arthritis   . Atrial fibrillation ([place])   . Hyperlipidemia   . Cataract     BILATERAL  . Glaucoma   . GERD (gastroesophageal reflux disease)   . Osteoporosis   . Headache     Past Surgical History  Procedure Laterality Date  . Back surgery  2010  . Burr hole for subdural hematoma      x 2 hematoma  . Knee surgery Bilateral   . Vaginal hysterectomy    . Appendectomy    . Colonoscopy      There were no vitals filed for this visit.      Subjective Assessment - 06/24/15 1114    Subjective "I'm not doing too well, sister is not doing well and is dying."    Pertinent History fall 6 yrs ago in [place] with SDH, peripheral neuropathy, HTN, HLD, DDD, depression, OA, afib, bil cataracts, glaucoma, osteoporosis   Limitations Walking   Patient Stated Goals "I'd be darned if I know, you tell me"   Currently in Pain? No/denies           Therex:  Initiated BLE strengthening with supine and SL hip exercises, see  pt instruction for details on exercises and reps.      NMR:  Also initiated corner balance tasks to reduce fall risk.  See pt instruction for details on exercises and reps.                         PT Education - 06/24/15 1430    Education provided Yes   Education Details HEP, see pt instruction   Person(s) Educated Patient   Methods Explanation;Demonstration;Handout   Comprehension Verbalized understanding;Returned demonstration             PT Long Term Goals - 06/12/15 1310    PT LONG TERM GOAL #1   Title verbalize understanding of fall prevention strategies (07/10/15)   Time 4   Period Weeks   Status New   PT LONG TERM GOAL #2   Title independent with HEP and establish community fitness program (07/10/15)   Time 4   Period Weeks   Status New   PT LONG TERM GOAL #3   Title improve dynamic gait index to >/= 20/24 for improved mobility and decreased fall risk (07/10/15)   Time 4   Period Weeks   Status New  PT LONG TERM GOAL #4   Title improve BERG balance score to >/= 53/56 for improved balance and decreased fall risk (07/10/15)   Time 4   Period Weeks   Status New               Plan - 06/24/15 1431    Clinical Impression Statement Skilled session focused on BLE strengthening and balance as well as providing initial HEP for these deficits.  Tolerated well during session, continue POC.    Rehab Potential Good   PT Frequency 2x / week   PT Duration 4 weeks   PT Treatment/Interventions ADLs/Self Care Home Management;Patient/family education;Neuromuscular re-education;Balance training;Therapeutic exercise;Therapeutic activities;Functional mobility training;Stair training;Gait training;DME Instruction;Vestibular   PT Next Visit Plan HEP for lower ext strengthening, balance; try gait with AD PRN   Consulted and Agree with Plan of Care Patient       PHYSICAL THERAPY DISCHARGE SUMMARY  Visits from Start of Care: 2  Current functional level  related to goals / functional outcomes: Unable to state due to pt did not return to PT.   Remaining deficits:     PT Long Term Goals - 06/12/15 1310      PT LONG TERM GOAL #1   Title verbalize understanding of fall prevention strategies (07/10/15)   Time 4   Period Weeks   Status New     PT LONG TERM GOAL #2   Title independent with HEP and establish community fitness program (07/10/15)   Time 4   Period Weeks   Status New     PT LONG TERM GOAL #3   Title improve dynamic gait index to >/= 20/24 for improved mobility and decreased fall risk (07/10/15)   Time 4   Period Weeks   Status New     PT LONG TERM GOAL #4   Title improve BERG balance score to >/= 53/56 for improved balance and decreased fall risk (07/10/15)   Time 4   Period Weeks   Status New        Education / Equipment: HEP  Plan: Patient agrees to discharge.  Patient goals were not met. Patient is being discharged due to not returning since the last visit.  ?????    Addendum: G Codes Functional Assessment Tool: DGI: 16/24 Functional Limitation:  Mobility: Walking and Moving Around  Goal Status (W1027): CI Discharge Status  (O5366): CJ  D/C and Gcode entered: Cameron Sprang, PT, MPT [facility] [address] [place], [place], [address] Phone: [phone]   Fax:  [phone] 12/06/15, 1:12 PM   Patient will benefit from skilled therapeutic intervention in order to improve the following deficits and impairments:  Abnormal gait, Decreased balance, Decreased strength, Decreased mobility, Decreased knowledge of use of DME  Visit Diagnosis: Other abnormalities of gait and mobility  Muscle weakness (generalized)  History of falling     Problem List Patient Active Problem List   Diagnosis Date Noted  . Falls 05/18/2015  . Atrial fibrillation ([place]) 10/30/2014  . Whiplash 10/16/2014  . Memory changes 10/16/2014  . Hemorrhage of rectum and anus 10/13/2013     Cameron Sprang, PT, MPT [facility] [address] [place], [place], [address] Phone: [phone]   Fax:  [phone] 06/24/2015, 2:33 PM  Name: [name] MRN: [id number] Date of Birth: February 12, 1932

## 2015-06-27 ENCOUNTER — Ambulatory Visit: Payer: Medicare Other | Admitting: Rehabilitation

## 2015-07-01 ENCOUNTER — Ambulatory Visit: Payer: Medicare Other | Admitting: Physical Therapy

## 2015-07-04 ENCOUNTER — Ambulatory Visit: Payer: Medicare Other | Admitting: Rehabilitation

## 2015-07-08 ENCOUNTER — Ambulatory Visit: Payer: Medicare Other | Admitting: Rehabilitation

## 2015-07-11 ENCOUNTER — Ambulatory Visit: Payer: Medicare Other | Admitting: Rehabilitation

## 2015-07-15 ENCOUNTER — Ambulatory Visit: Payer: Medicare Other | Admitting: Physical Therapy

## 2015-07-18 ENCOUNTER — Ambulatory Visit: Payer: Medicare Other | Admitting: Rehabilitation

## 2015-08-08 ENCOUNTER — Ambulatory Visit: Payer: Medicare Other | Admitting: Neurology

## 2015-08-14 ENCOUNTER — Ambulatory Visit (INDEPENDENT_AMBULATORY_CARE_PROVIDER_SITE_OTHER): Payer: Medicare Other | Admitting: Neurology

## 2015-08-14 ENCOUNTER — Encounter: Payer: Self-pay | Admitting: Neurology

## 2015-08-14 VITALS — BP 156/92 | HR 80 | Ht 66.5 in | Wt 175.8 lb

## 2015-08-14 DIAGNOSIS — F0390 Unspecified dementia without behavioral disturbance: Secondary | ICD-10-CM | POA: Diagnosis not present

## 2015-08-14 MED ORDER — DONEPEZIL HCL 10 MG PO TABS: 10.0000 mg | ORAL_TABLET | Freq: Every day | ORAL | 11 refills | Status: DC

## 2015-08-14 NOTE — Patient Instructions (Signed)
Remember to drink plenty of fluid, eat healthy meals and do not skip any meals. Try to eat protein with a every meal and eat a healthy snack such as fruit or nuts in between meals. Try to keep a regular sleep-wake schedule and try to exercise daily, particularly in the form of walking, 20-30 minutes a day, if you can.   As far as your medications are concerned, I would like to suggest: Continue Aricept  I would like to see you back in 6 months, sooner if we need to. Please call us with any interim questions, concerns, problems, updates or refill requests.   Please also call us for any test results so we can go over those with you on the phone.  My clinical assistant and will answer any of your questions and relay your messages to me and also relay most of my messages to you.   Our phone number is [phone]. We also have an after hours call service for urgent matters and there is a physician on-call for urgent questions. For any emergencies you know to call 911 or go to the nearest emergency room
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
## 2015-09-26 DIAGNOSIS — L298 Other pruritus: Secondary | ICD-10-CM | POA: Diagnosis not present

## 2015-09-26 DIAGNOSIS — L821 Other seborrheic keratosis: Secondary | ICD-10-CM | POA: Diagnosis not present

## 2015-09-26 DIAGNOSIS — H40019 Open angle with borderline findings, low risk, unspecified eye: Secondary | ICD-10-CM | POA: Diagnosis not present

## 2015-09-26 DIAGNOSIS — L820 Inflamed seborrheic keratosis: Secondary | ICD-10-CM | POA: Diagnosis not present

## 2015-10-02 DIAGNOSIS — R1032 Left lower quadrant pain: Secondary | ICD-10-CM | POA: Diagnosis not present

## 2015-10-02 DIAGNOSIS — R42 Dizziness and giddiness: Secondary | ICD-10-CM | POA: Diagnosis not present

## 2015-10-02 DIAGNOSIS — Z6827 Body mass index (BMI) 27.0-27.9, adult: Secondary | ICD-10-CM | POA: Diagnosis not present

## 2015-10-02 DIAGNOSIS — I4891 Unspecified atrial fibrillation: Secondary | ICD-10-CM | POA: Diagnosis not present

## 2015-10-02 DIAGNOSIS — Z23 Encounter for immunization: Secondary | ICD-10-CM | POA: Diagnosis not present

## 2015-10-02 DIAGNOSIS — R2689 Other abnormalities of gait and mobility: Secondary | ICD-10-CM | POA: Diagnosis not present

## 2015-10-02 DIAGNOSIS — R413 Other amnesia: Secondary | ICD-10-CM | POA: Diagnosis not present

## 2015-10-03 ENCOUNTER — Ambulatory Visit (HOSPITAL_COMMUNITY)
Admission: RE | Admit: 2015-10-03 | Discharge: 2015-10-03 | Disposition: A | Discharge status: Home / Self Care | Payer: Medicare Other | Source: Ambulatory Visit | Attending: Nurse Practitioner | Admitting: Nurse Practitioner

## 2015-10-03 ENCOUNTER — Encounter (HOSPITAL_COMMUNITY): Payer: Self-pay | Admitting: Nurse Practitioner

## 2015-10-03 VITALS — BP 146/84 | HR 87 | Ht 66.5 in | Wt 177.0 lb

## 2015-10-03 DIAGNOSIS — E785 Hyperlipidemia, unspecified: Secondary | ICD-10-CM | POA: Insufficient documentation

## 2015-10-03 DIAGNOSIS — I482 Chronic atrial fibrillation, unspecified: Secondary | ICD-10-CM

## 2015-10-03 DIAGNOSIS — H409 Unspecified glaucoma: Secondary | ICD-10-CM | POA: Insufficient documentation

## 2015-10-03 DIAGNOSIS — Z885 Allergy status to narcotic agent status: Secondary | ICD-10-CM | POA: Diagnosis not present

## 2015-10-03 DIAGNOSIS — I4891 Unspecified atrial fibrillation: Secondary | ICD-10-CM | POA: Diagnosis present

## 2015-10-03 DIAGNOSIS — F0390 Unspecified dementia without behavioral disturbance: Secondary | ICD-10-CM | POA: Insufficient documentation

## 2015-10-03 DIAGNOSIS — R42 Dizziness and giddiness: Secondary | ICD-10-CM | POA: Insufficient documentation

## 2015-10-03 DIAGNOSIS — I10 Essential (primary) hypertension: Secondary | ICD-10-CM | POA: Diagnosis not present

## 2015-10-03 DIAGNOSIS — K219 Gastro-esophageal reflux disease without esophagitis: Secondary | ICD-10-CM | POA: Diagnosis not present

## 2015-10-03 DIAGNOSIS — Z8249 Family history of ischemic heart disease and other diseases of the circulatory system: Secondary | ICD-10-CM | POA: Diagnosis not present

## 2015-10-03 DIAGNOSIS — F329 Major depressive disorder, single episode, unspecified: Secondary | ICD-10-CM | POA: Insufficient documentation

## 2015-10-03 DIAGNOSIS — G629 Polyneuropathy, unspecified: Secondary | ICD-10-CM | POA: Diagnosis not present

## 2015-10-03 DIAGNOSIS — Z9181 History of falling: Secondary | ICD-10-CM | POA: Insufficient documentation

## 2015-10-03 DIAGNOSIS — Z79899 Other long term (current) drug therapy: Secondary | ICD-10-CM | POA: Insufficient documentation
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
## 2015-10-07 ENCOUNTER — Other Ambulatory Visit: Payer: Self-pay | Admitting: Internal Medicine

## 2015-10-17 DIAGNOSIS — D2271 Melanocytic nevi of right lower limb, including hip: Secondary | ICD-10-CM | POA: Diagnosis not present

## 2015-10-17 DIAGNOSIS — D225 Melanocytic nevi of trunk: Secondary | ICD-10-CM | POA: Diagnosis not present

## 2015-10-17 DIAGNOSIS — L814 Other melanin hyperpigmentation: Secondary | ICD-10-CM | POA: Diagnosis not present

## 2015-10-17 DIAGNOSIS — L821 Other seborrheic keratosis: Secondary | ICD-10-CM | POA: Diagnosis not present

## 2015-11-08 DIAGNOSIS — R410 Disorientation, unspecified: Secondary | ICD-10-CM | POA: Diagnosis not present

## 2015-11-08 DIAGNOSIS — I4891 Unspecified atrial fibrillation: Secondary | ICD-10-CM | POA: Diagnosis not present

## 2015-11-08 DIAGNOSIS — R197 Diarrhea, unspecified: Secondary | ICD-10-CM | POA: Diagnosis not present

## 2015-11-08 DIAGNOSIS — R2689 Other abnormalities of gait and mobility: Secondary | ICD-10-CM | POA: Diagnosis not present

## 2015-11-08 DIAGNOSIS — R413 Other amnesia: Secondary | ICD-10-CM | POA: Diagnosis not present

## 2015-11-08 DIAGNOSIS — F329 Major depressive disorder, single episode, unspecified: Secondary | ICD-10-CM | POA: Diagnosis not present

## 2015-11-08 DIAGNOSIS — Z6827 Body mass index (BMI) 27.0-27.9, adult: Secondary | ICD-10-CM | POA: Diagnosis not present

## 2015-11-11 ENCOUNTER — Other Ambulatory Visit: Payer: Self-pay | Admitting: Internal Medicine

## 2015-11-11 DIAGNOSIS — R2689 Other abnormalities of gait and mobility: Secondary | ICD-10-CM

## 2015-11-11 DIAGNOSIS — R410 Disorientation, unspecified: Secondary | ICD-10-CM

## 2015-11-12 ENCOUNTER — Encounter: Payer: Self-pay | Admitting: Cardiovascular Disease

## 2015-11-14 ENCOUNTER — Other Ambulatory Visit: Payer: Medicare Other

## 2015-11-19 ENCOUNTER — Other Ambulatory Visit: Payer: Self-pay | Admitting: Internal Medicine

## 2015-11-19 DIAGNOSIS — R410 Disorientation, unspecified: Secondary | ICD-10-CM

## 2015-11-19 DIAGNOSIS — R2689 Other abnormalities of gait and mobility: Secondary | ICD-10-CM

## 2015-11-21 ENCOUNTER — Other Ambulatory Visit: Payer: Medicare Other

## 2015-11-21 ENCOUNTER — Ambulatory Visit
Admission: RE | Admit: 2015-11-21 | Discharge: 2015-11-21 | Disposition: A | Discharge status: Home / Self Care | Payer: Medicare Other | Source: Ambulatory Visit | Attending: Internal Medicine | Admitting: Internal Medicine

## 2015-11-21 DIAGNOSIS — R51 Headache: Secondary | ICD-10-CM | POA: Diagnosis not present

## 2015-11-21 DIAGNOSIS — R410 Disorientation, unspecified: Secondary | ICD-10-CM

## 2015-11-21 DIAGNOSIS — R2689 Other abnormalities of gait and mobility: Secondary | ICD-10-CM

## 2015-11-22 ENCOUNTER — Ambulatory Visit (INDEPENDENT_AMBULATORY_CARE_PROVIDER_SITE_OTHER): Payer: Medicare Other | Admitting: Cardiovascular Disease

## 2015-11-22 ENCOUNTER — Encounter: Payer: Self-pay | Admitting: Cardiovascular Disease

## 2015-11-22 VITALS — BP 146/70 | HR 87 | Ht 68.5 in | Wt 171.8 lb

## 2015-11-22 DIAGNOSIS — I482 Chronic atrial fibrillation, unspecified: Secondary | ICD-10-CM

## 2015-11-22 NOTE — Patient Instructions (Signed)
Medication Instructions:  Your physician recommends that you continue on your current medications as directed. Please refer to the Current Medication list given to you today.   Labwork: None Ordered   Testing/Procedures: None Ordered   Follow-Up: Your physician wants you to follow-up in: 6 months with Dr. Nahser.  You will receive a reminder letter in the mail two months in advance. If you don't receive a letter, please call our office to schedule the follow-up appointment.   If you need a refill on your cardiac medications before your next appointment, please call your pharmacy.   Thank you for choosing [facility]! Gilbert Narain, RN [phone]    
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
## 2015-12-30 ENCOUNTER — Other Ambulatory Visit: Payer: Self-pay | Admitting: Internal Medicine

## 2016-01-28 DIAGNOSIS — H52223 Regular astigmatism, bilateral: Secondary | ICD-10-CM | POA: Diagnosis not present

## 2016-01-28 DIAGNOSIS — H5213 Myopia, bilateral: Secondary | ICD-10-CM | POA: Diagnosis not present

## 2016-01-28 DIAGNOSIS — Z961 Presence of intraocular lens: Secondary | ICD-10-CM | POA: Diagnosis not present

## 2016-01-28 DIAGNOSIS — H401122 Primary open-angle glaucoma, left eye, moderate stage: Secondary | ICD-10-CM | POA: Diagnosis not present

## 2016-01-28 DIAGNOSIS — H401112 Primary open-angle glaucoma, right eye, moderate stage: Secondary | ICD-10-CM | POA: Diagnosis not present

## 2016-02-17 ENCOUNTER — Ambulatory Visit: Payer: Medicare Other | Admitting: Adult Health

## 2016-03-05 DIAGNOSIS — F329 Major depressive disorder, single episode, unspecified: Secondary | ICD-10-CM | POA: Diagnosis not present

## 2016-03-23 DIAGNOSIS — R7301 Impaired fasting glucose: Secondary | ICD-10-CM | POA: Diagnosis not present

## 2016-03-23 DIAGNOSIS — E784 Other hyperlipidemia: Secondary | ICD-10-CM | POA: Diagnosis not present

## 2016-03-23 DIAGNOSIS — I10 Essential (primary) hypertension: Secondary | ICD-10-CM | POA: Diagnosis not present

## 2016-04-01 DIAGNOSIS — Z1212 Encounter for screening for malignant neoplasm of rectum: Secondary | ICD-10-CM | POA: Diagnosis not present

## 2016-04-02 ENCOUNTER — Ambulatory Visit (INDEPENDENT_AMBULATORY_CARE_PROVIDER_SITE_OTHER): Payer: Medicare Other | Admitting: Neurology

## 2016-04-02 ENCOUNTER — Telehealth: Payer: Self-pay | Admitting: Neurology

## 2016-04-02 ENCOUNTER — Encounter: Payer: Self-pay | Admitting: Neurology

## 2016-04-02 DIAGNOSIS — R269 Unspecified abnormalities of gait and mobility: Secondary | ICD-10-CM

## 2016-04-02 DIAGNOSIS — G3184 Mild cognitive impairment, so stated: Secondary | ICD-10-CM | POA: Insufficient documentation

## 2016-04-02 MED ORDER — DONEPEZIL HCL 10 MG PO TABS: 10.0000 mg | ORAL_TABLET | Freq: Every day | ORAL | 11 refills | Status: DC

## 2016-04-02 NOTE — Telephone Encounter (Signed)
[name], patient lives at [facility] and I would like to initiate physical therapy for gait abnormality and many falls. Can you please find out how we get this done at [place] - do they have their own PT or can I place a home PT order? Thanks!

## 2016-04-02 NOTE — Patient Instructions (Signed)
Remember to drink plenty of fluid, eat healthy meals and do not skip any meals. Try to eat protein with a every meal and eat a healthy snack such as fruit or nuts in between meals. Try to keep a regular sleep-wake schedule and try to exercise daily, particularly in the form of walking, 20-30 minutes a day, if you can.   As far as your medications are concerned, I would like to suggest: Continue Aricept  I would like to see you back in 6 months, sooner if we need to. Please call us with any interim questions, concerns, problems, updates or refill requests.   Our phone number is [phone]. We also have an after hours call service for urgent matters and there is a physician on-call for urgent questions. For any emergencies you know to call 911 or go to the nearest emergency room
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
## 2016-04-07 NOTE — Telephone Encounter (Signed)
[name], can you take care of this since Hinton Dyer is no tin the office? thanks

## 2016-04-08 NOTE — Telephone Encounter (Signed)
PT order placed in EPIC for pt to participate in PT at facility.

## 2016-04-08 NOTE — Telephone Encounter (Signed)
Thank you ALL!!!

## 2016-04-08 NOTE — Telephone Encounter (Signed)
I called [facility] to request about how to get a patient referred for physical therapy. They offer onsite [facility]. I called [facility] and obtained the fax number for the referral. I will need the referral to be added and then I will fax it off with patient information and get her scheduled. Thanks!

## 2016-04-08 NOTE — Telephone Encounter (Signed)
Danielle, can you help with this please?

## 2016-04-08 NOTE — Addendum Note (Signed)
Addended by: Monte Fantasia on: 04/08/2016 03:15 PM   Modules accepted: Orders

## 2016-04-09 NOTE — Telephone Encounter (Signed)
Order has been faxed to [facility] [phone].

## 2016-04-21 ENCOUNTER — Encounter: Payer: Self-pay | Admitting: Cardiovascular Disease

## 2016-04-21 DIAGNOSIS — R262 Difficulty in walking, not elsewhere classified: Secondary | ICD-10-CM | POA: Diagnosis not present

## 2016-04-21 DIAGNOSIS — R296 Repeated falls: Secondary | ICD-10-CM | POA: Diagnosis not present

## 2016-04-21 DIAGNOSIS — R278 Other lack of coordination: Secondary | ICD-10-CM | POA: Diagnosis not present

## 2016-04-23 DIAGNOSIS — R262 Difficulty in walking, not elsewhere classified: Secondary | ICD-10-CM | POA: Diagnosis not present

## 2016-04-23 DIAGNOSIS — R296 Repeated falls: Secondary | ICD-10-CM | POA: Diagnosis not present

## 2016-04-23 DIAGNOSIS — R278 Other lack of coordination: Secondary | ICD-10-CM | POA: Diagnosis not present

## 2016-04-27 DIAGNOSIS — R278 Other lack of coordination: Secondary | ICD-10-CM | POA: Diagnosis not present

## 2016-04-27 DIAGNOSIS — R296 Repeated falls: Secondary | ICD-10-CM | POA: Diagnosis not present

## 2016-04-27 DIAGNOSIS — R262 Difficulty in walking, not elsewhere classified: Secondary | ICD-10-CM | POA: Diagnosis not present

## 2016-04-30 DIAGNOSIS — R278 Other lack of coordination: Secondary | ICD-10-CM | POA: Diagnosis not present

## 2016-04-30 DIAGNOSIS — R296 Repeated falls: Secondary | ICD-10-CM | POA: Diagnosis not present

## 2016-04-30 DIAGNOSIS — R262 Difficulty in walking, not elsewhere classified: Secondary | ICD-10-CM | POA: Diagnosis not present

## 2016-05-06 ENCOUNTER — Ambulatory Visit: Payer: Medicare Other | Admitting: Cardiovascular Disease

## 2016-05-06 DIAGNOSIS — Z6827 Body mass index (BMI) 27.0-27.9, adult: Secondary | ICD-10-CM | POA: Diagnosis not present

## 2016-05-06 DIAGNOSIS — R1032 Left lower quadrant pain: Secondary | ICD-10-CM | POA: Diagnosis not present

## 2016-05-06 DIAGNOSIS — R197 Diarrhea, unspecified: Secondary | ICD-10-CM | POA: Diagnosis not present

## 2016-05-06 DIAGNOSIS — R10817 Generalized abdominal tenderness: Secondary | ICD-10-CM | POA: Diagnosis not present

## 2016-05-07 ENCOUNTER — Other Ambulatory Visit: Payer: Medicare Other

## 2016-05-07 ENCOUNTER — Other Ambulatory Visit: Payer: Self-pay | Admitting: Internal Medicine

## 2016-05-07 DIAGNOSIS — R1032 Left lower quadrant pain: Secondary | ICD-10-CM

## 2016-05-12 ENCOUNTER — Ambulatory Visit
Admission: RE | Admit: 2016-05-12 | Discharge: 2016-05-12 | Disposition: A | Discharge status: Home / Self Care | Payer: Medicare Other | Source: Ambulatory Visit | Attending: Internal Medicine | Admitting: Internal Medicine

## 2016-05-12 DIAGNOSIS — K5730 Diverticulosis of large intestine without perforation or abscess without bleeding: Secondary | ICD-10-CM | POA: Diagnosis not present

## 2016-05-12 DIAGNOSIS — R1032 Left lower quadrant pain: Secondary | ICD-10-CM

## 2016-05-12 MED ORDER — IOPAMIDOL (ISOVUE-300) INJECTION 61%
80.0000 mL | Freq: Once | INTRAVENOUS | Status: AC | PRN
  Administered 2016-05-12: 80 mL via INTRAVENOUS

## 2016-06-10 ENCOUNTER — Other Ambulatory Visit: Payer: Self-pay | Admitting: Internal Medicine

## 2016-06-19 DIAGNOSIS — G4700 Insomnia, unspecified: Secondary | ICD-10-CM | POA: Diagnosis not present

## 2016-06-19 DIAGNOSIS — F329 Major depressive disorder, single episode, unspecified: Secondary | ICD-10-CM | POA: Diagnosis not present

## 2016-06-19 DIAGNOSIS — R413 Other amnesia: Secondary | ICD-10-CM | POA: Diagnosis not present

## 2016-07-08 DIAGNOSIS — H401122 Primary open-angle glaucoma, left eye, moderate stage: Secondary | ICD-10-CM | POA: Diagnosis not present

## 2016-07-08 DIAGNOSIS — H401192 Primary open-angle glaucoma, unspecified eye, moderate stage: Secondary | ICD-10-CM | POA: Diagnosis not present

## 2016-07-08 DIAGNOSIS — H401112 Primary open-angle glaucoma, right eye, moderate stage: Secondary | ICD-10-CM | POA: Diagnosis not present

## 2016-07-08 DIAGNOSIS — H5340 Unspecified visual field defects: Secondary | ICD-10-CM | POA: Diagnosis not present

## 2016-07-23 ENCOUNTER — Encounter: Payer: Self-pay | Admitting: Cardiovascular Disease

## 2016-07-23 ENCOUNTER — Ambulatory Visit (INDEPENDENT_AMBULATORY_CARE_PROVIDER_SITE_OTHER): Payer: Medicare Other | Admitting: Cardiovascular Disease

## 2016-07-23 VITALS — BP 110/80 | HR 97 | Ht 68.5 in | Wt 176.8 lb

## 2016-07-23 DIAGNOSIS — I482 Chronic atrial fibrillation, unspecified: Secondary | ICD-10-CM
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
## 2016-07-23 NOTE — Patient Instructions (Addendum)
Medication Instructions:    Your physician recommends that you continue on your current medications as directed. Please refer to the Current Medication list given to you today.  --- If you need a refill on your cardiac medications before your next appointment, please call your pharmacy. ---  Labwork:  None ordered  Testing/Procedures:  None ordered  Follow-Up:  Your physician wants you to follow-up in: 6 months with Dr. Nahser.  You will receive a reminder letter in the mail two months in advance. If you don't receive a letter, please call our office to schedule the follow-up appointment.  Thank you for choosing [facility]!!            

## 2016-08-24 ENCOUNTER — Other Ambulatory Visit: Payer: Self-pay | Admitting: Internal Medicine

## 2016-09-01 ENCOUNTER — Other Ambulatory Visit: Payer: Self-pay | Admitting: Internal Medicine

## 2016-09-25 DIAGNOSIS — D2271 Melanocytic nevi of right lower limb, including hip: Secondary | ICD-10-CM | POA: Diagnosis not present

## 2016-09-25 DIAGNOSIS — L821 Other seborrheic keratosis: Secondary | ICD-10-CM | POA: Diagnosis not present

## 2016-09-25 DIAGNOSIS — L814 Other melanin hyperpigmentation: Secondary | ICD-10-CM | POA: Diagnosis not present

## 2016-09-25 DIAGNOSIS — D225 Melanocytic nevi of trunk: Secondary | ICD-10-CM | POA: Diagnosis not present

## 2016-09-25 DIAGNOSIS — D2262 Melanocytic nevi of left upper limb, including shoulder: Secondary | ICD-10-CM | POA: Diagnosis not present

## 2016-09-25 DIAGNOSIS — D2261 Melanocytic nevi of right upper limb, including shoulder: Secondary | ICD-10-CM | POA: Diagnosis not present

## 2016-09-25 DIAGNOSIS — D220 Melanocytic nevi of lip: Secondary | ICD-10-CM | POA: Diagnosis not present

## 2016-09-25 DIAGNOSIS — B078 Other viral warts: Secondary | ICD-10-CM | POA: Diagnosis not present

## 2016-09-25 DIAGNOSIS — L570 Actinic keratosis: Secondary | ICD-10-CM | POA: Diagnosis not present

## 2016-10-06 ENCOUNTER — Ambulatory Visit: Payer: Medicare Other | Admitting: Adult Health

## 2016-10-07 ENCOUNTER — Encounter: Payer: Self-pay | Admitting: Adult Health

## 2016-10-14 DIAGNOSIS — I10 Essential (primary) hypertension: Secondary | ICD-10-CM | POA: Diagnosis not present

## 2016-10-14 DIAGNOSIS — R296 Repeated falls: Secondary | ICD-10-CM | POA: Diagnosis not present

## 2016-10-14 DIAGNOSIS — R7301 Impaired fasting glucose: Secondary | ICD-10-CM | POA: Diagnosis not present

## 2016-10-14 DIAGNOSIS — R4182 Altered mental status, unspecified: Secondary | ICD-10-CM | POA: Diagnosis not present

## 2016-10-14 DIAGNOSIS — G4700 Insomnia, unspecified: Secondary | ICD-10-CM | POA: Diagnosis not present

## 2016-10-14 DIAGNOSIS — R413 Other amnesia: Secondary | ICD-10-CM | POA: Diagnosis not present

## 2016-10-14 DIAGNOSIS — K921 Melena: Secondary | ICD-10-CM | POA: Diagnosis not present

## 2016-10-14 DIAGNOSIS — G300 Alzheimer's disease with early onset: Secondary | ICD-10-CM | POA: Diagnosis not present

## 2016-10-14 DIAGNOSIS — R2689 Other abnormalities of gait and mobility: Secondary | ICD-10-CM | POA: Diagnosis not present

## 2016-10-14 DIAGNOSIS — Z23 Encounter for immunization: Secondary | ICD-10-CM | POA: Diagnosis not present

## 2016-10-14 DIAGNOSIS — N183 Chronic kidney disease, stage 3 (moderate): Secondary | ICD-10-CM | POA: Diagnosis not present

## 2016-10-14 DIAGNOSIS — I4891 Unspecified atrial fibrillation: Secondary | ICD-10-CM | POA: Diagnosis not present

## 2016-10-20 DIAGNOSIS — Z1212 Encounter for screening for malignant neoplasm of rectum: Secondary | ICD-10-CM | POA: Diagnosis not present

## 2016-10-26 DIAGNOSIS — H401131 Primary open-angle glaucoma, bilateral, mild stage: Secondary | ICD-10-CM | POA: Diagnosis not present

## 2016-10-26 DIAGNOSIS — H53453 Other localized visual field defect, bilateral: Secondary | ICD-10-CM | POA: Diagnosis not present

## 2016-11-04 ENCOUNTER — Ambulatory Visit: Payer: Medicare Other | Admitting: Gastroenterology

## 2016-11-24 ENCOUNTER — Encounter: Payer: Self-pay | Admitting: Neurology

## 2016-11-24 ENCOUNTER — Ambulatory Visit (INDEPENDENT_AMBULATORY_CARE_PROVIDER_SITE_OTHER): Payer: Medicare Other | Admitting: Neurology

## 2016-11-24 ENCOUNTER — Telehealth: Payer: Self-pay | Admitting: Neurology

## 2016-11-24 ENCOUNTER — Encounter (INDEPENDENT_AMBULATORY_CARE_PROVIDER_SITE_OTHER): Payer: Self-pay

## 2016-11-24 VITALS — BP 173/99 | HR 75 | Wt 183.0 lb

## 2016-11-24 DIAGNOSIS — R410 Disorientation, unspecified: Secondary | ICD-10-CM | POA: Diagnosis not present

## 2016-11-24 DIAGNOSIS — R2689 Other abnormalities of gait and mobility: Secondary | ICD-10-CM | POA: Diagnosis not present

## 2016-11-24 DIAGNOSIS — R404 Transient alteration of awareness: Secondary | ICD-10-CM

## 2016-11-24 DIAGNOSIS — F0391 Unspecified dementia with behavioral disturbance: Secondary | ICD-10-CM | POA: Diagnosis not present

## 2016-11-24 DIAGNOSIS — W19XXXA Unspecified fall, initial encounter: Secondary | ICD-10-CM

## 2016-11-24 NOTE — Telephone Encounter (Signed)
Robin, Can I ask for your help please. Do you mind calling patient for [place]? Both Bethany and I have had conversations already with patient, and daughter. Would really appreciate it. See my conversation with patient's daughter in telephone note and notes from other staff, I need someone else to call please. I would like to reschedule patient to my schedule right after lunch in about 8 months. Would you explain to her that if she was so upset about waiting then being seen right after lunch is the best way not to wait.  Also she should not show up an hour early to her appointment, she should show up 15 minutes before appointment time. Please let mw know if this is ok with you calling patient, Patient and daughter do not feel it is worth patient's time to come all the way to the office to see an NP so put her on my schedule. Would you be ok with this? thanks

## 2016-11-24 NOTE — Patient Instructions (Signed)
Remember to drink plenty of fluid, eat healthy meals and do not skip any meals. Try to eat protein with a every meal and eat a healthy snack such as fruit or nuts in between meals. Try to keep a regular sleep-wake schedule and try to exercise daily, particularly in the form of walking, 20-30 minutes a day, if you can.   As far as diagnostic testing: Formal neurocognitive testing with Dr. Bonita Quin  I would like to see you back in 9 months, sooner if we need to. Please call us with any interim questions, concerns, problems, updates or refill requests.   Our phone number is [phone]. We also have an after hours call service for urgent matters and there is a physician on-call for urgent questions. For any emergencies you know to call 911 or go to the nearest emergency room

## 2016-11-24 NOTE — Telephone Encounter (Signed)
Patient's daughter said they were irritated, it was "ludicrous" that she waited to see me. Daughter said she lost money waiting for me too  Patient showed up at 230 for a 3:30pm appt. Was upset she waited so long. I advised we brought he back on time at her appointment time. I apologized for the wait but unfortunately we try to give every patient the time they need. Advised patient to show up closer to her appointment time next time.  I know it is frustrating and difficult trying to manage a parent from a distance, especially a parent with Alzheimers disease. Patient's husband also has dementia. Patient is drinking several glasses of wine a night and taking sedating medications. This is a fall risk and can worsen memory. I do recommend memory unit at [facility].

## 2016-11-24 NOTE — Telephone Encounter (Signed)
Pt daughter on DPR Edwena Bunde) has called stating that she was unable to ask Dr Jaynee Eagles all the questions she wanted to concerning pt since according to daughter the appointment was cut short.  Pt daughter stated that her mother was to be scheduled for an MRI and memory testing but left the office with nothing re: these appoinrments.  It was explained to pt daughter that there are preliminary step that have to be taken by special coordinators in the office before the appointment dates are offered to pt.  Daughter Laurie Panda and has requested a call from Dr Jaynee Eagles for her additional questions. She can be reached at [phone]

## 2016-11-24 NOTE — Telephone Encounter (Signed)
3:45pm-Patient's daughter called stating she was out of town and her mother is still sitting in the waiting room for a 3pm appointment with Dr. Jaynee Eagles. I advised her the patient's appointment is at 3:30 but she needed to be here at 3pm to check in.She said never again and asked me when she will see the doctor. I told her I was not sure and before I could say anything else she said find out!! I sent a message to [facility] Dr. Cathren Laine nurse and she was in the room with the patient. When I went back on the line to speak to her daughter she was gone. The daughter was very rude on the phone.
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
## 2016-11-26 ENCOUNTER — Telehealth: Payer: Self-pay

## 2016-11-26 NOTE — Telephone Encounter (Signed)
Spoke with patient and scheduled her for a re-visit with Dr. Jaynee Eagles on July 8th 2019 at 1:00pm. I explained to be here no earlier than 1230 to che4ck in. She would be the first patient after lunch. I made apologies for the confusion with last visit. She was thankful for call and appreciated the appointment time. Romelle Starcher, can you put that on her schedule. Wasn't sure if you did something different than me when scheduling. Thanks

## 2016-11-26 NOTE — Telephone Encounter (Addendum)
I scheduled the appt w/ Dr. Jaynee Eagles on 07/26/17 @ 1:00 arrival time 12:30. This is a 60 min appt authorized by Dr. Jaynee Eagles.

## 2016-11-30 ENCOUNTER — Telehealth: Payer: Self-pay | Admitting: *Deleted

## 2016-11-30 NOTE — Telephone Encounter (Signed)
-----   Message from [place] sent at 11/26/2016  9:58 AM EST ----- Patient thinks she is suppose to see a sleep doctor. Do you want to send a referral and we can get her in with [facility]. I told her I would let her know.

## 2016-11-30 NOTE — Telephone Encounter (Signed)
I spoke with Dr. Jaynee Eagles regarding sleep study referral. She presented this as an option at the last visit but no formal decision was made. I called the patient's daughter [name] to clarify. She states that she will d/w her mother [name] and will call us back with an answer as to whether or not to proceed with the referral.

## 2016-11-30 NOTE — Telephone Encounter (Signed)
I don;t think it is necessary at this poin, proceed with neurocognitive testing thanks

## 2016-11-30 NOTE — Telephone Encounter (Signed)
[name] [name] called back saying she had spoken with her mother. She states that the patient said no to the sleep study. She does not want to come to the office and spend the night. I offered for [place] to talk with the sleep study team and see if it could be done at her home. She stated that her mother probably wouldn't want that either with people coming into her home and while she's sleeping. She states that unless the sleep study is necessary per Dr. Jaynee Eagles, they will just proceed with the neurocognitive testing and go from there. She is aware that the MRI is scheduled for 11/26 @ 2:00 pm.

## 2016-12-03 ENCOUNTER — Encounter: Payer: Self-pay | Admitting: Psychology

## 2016-12-14 ENCOUNTER — Ambulatory Visit
Admission: RE | Admit: 2016-12-14 | Discharge: 2016-12-14 | Disposition: A | Discharge status: Home / Self Care | Payer: Medicare Other | Source: Ambulatory Visit | Attending: Neurology | Admitting: Neurology

## 2016-12-14 DIAGNOSIS — F0391 Unspecified dementia with behavioral disturbance: Secondary | ICD-10-CM

## 2016-12-14 DIAGNOSIS — R404 Transient alteration of awareness: Secondary | ICD-10-CM

## 2016-12-14 DIAGNOSIS — W19XXXA Unspecified fall, initial encounter: Secondary | ICD-10-CM

## 2016-12-14 DIAGNOSIS — R410 Disorientation, unspecified: Secondary | ICD-10-CM | POA: Diagnosis not present

## 2016-12-14 DIAGNOSIS — R2689 Other abnormalities of gait and mobility: Secondary | ICD-10-CM

## 2016-12-18 ENCOUNTER — Ambulatory Visit: Payer: Medicare Other | Admitting: Internal Medicine

## 2016-12-21 DIAGNOSIS — F329 Major depressive disorder, single episode, unspecified: Secondary | ICD-10-CM | POA: Diagnosis not present

## 2016-12-21 DIAGNOSIS — I10 Essential (primary) hypertension: Secondary | ICD-10-CM | POA: Diagnosis not present

## 2016-12-21 DIAGNOSIS — M159 Polyosteoarthritis, unspecified: Secondary | ICD-10-CM | POA: Diagnosis not present

## 2016-12-21 DIAGNOSIS — F5101 Primary insomnia: Secondary | ICD-10-CM | POA: Diagnosis not present

## 2016-12-21 DIAGNOSIS — L309 Dermatitis, unspecified: Secondary | ICD-10-CM | POA: Diagnosis not present

## 2016-12-21 DIAGNOSIS — G308 Other Alzheimer's disease: Secondary | ICD-10-CM | POA: Diagnosis not present

## 2016-12-21 DIAGNOSIS — E785 Hyperlipidemia, unspecified: Secondary | ICD-10-CM | POA: Diagnosis not present

## 2016-12-21 DIAGNOSIS — I482 Chronic atrial fibrillation: Secondary | ICD-10-CM | POA: Diagnosis not present

## 2016-12-23 ENCOUNTER — Other Ambulatory Visit: Payer: Self-pay | Admitting: Internal Medicine

## 2016-12-24 DIAGNOSIS — Z79899 Other long term (current) drug therapy: Secondary | ICD-10-CM | POA: Diagnosis not present

## 2016-12-30 DIAGNOSIS — F320 Major depressive disorder, single episode, mild: Secondary | ICD-10-CM | POA: Diagnosis not present

## 2017-01-04 DIAGNOSIS — M79602 Pain in left arm: Secondary | ICD-10-CM | POA: Diagnosis not present

## 2017-01-04 DIAGNOSIS — G308 Other Alzheimer's disease: Secondary | ICD-10-CM | POA: Diagnosis not present

## 2017-01-04 DIAGNOSIS — F329 Major depressive disorder, single episode, unspecified: Secondary | ICD-10-CM | POA: Diagnosis not present

## 2017-01-04 DIAGNOSIS — I10 Essential (primary) hypertension: Secondary | ICD-10-CM | POA: Diagnosis not present

## 2017-01-04 DIAGNOSIS — M159 Polyosteoarthritis, unspecified: Secondary | ICD-10-CM | POA: Diagnosis not present

## 2017-01-04 DIAGNOSIS — F5101 Primary insomnia: Secondary | ICD-10-CM | POA: Diagnosis not present

## 2017-01-26 DIAGNOSIS — F320 Major depressive disorder, single episode, mild: Secondary | ICD-10-CM | POA: Diagnosis not present

## 2017-02-01 DIAGNOSIS — G308 Other Alzheimer's disease: Secondary | ICD-10-CM | POA: Diagnosis not present

## 2017-02-01 DIAGNOSIS — F5101 Primary insomnia: Secondary | ICD-10-CM | POA: Diagnosis not present

## 2017-02-01 DIAGNOSIS — M545 Low back pain: Secondary | ICD-10-CM | POA: Diagnosis not present

## 2017-02-01 DIAGNOSIS — F3289 Other specified depressive episodes: Secondary | ICD-10-CM | POA: Diagnosis not present

## 2017-02-01 DIAGNOSIS — M25552 Pain in left hip: Secondary | ICD-10-CM | POA: Diagnosis not present

## 2017-02-01 DIAGNOSIS — W19XXXA Unspecified fall, initial encounter: Secondary | ICD-10-CM | POA: Diagnosis not present

## 2017-02-01 DIAGNOSIS — R233 Spontaneous ecchymoses: Secondary | ICD-10-CM | POA: Diagnosis not present

## 2017-02-01 DIAGNOSIS — I10 Essential (primary) hypertension: Secondary | ICD-10-CM | POA: Diagnosis not present

## 2017-02-05 DIAGNOSIS — H401112 Primary open-angle glaucoma, right eye, moderate stage: Secondary | ICD-10-CM | POA: Diagnosis not present

## 2017-02-05 DIAGNOSIS — H401122 Primary open-angle glaucoma, left eye, moderate stage: Secondary | ICD-10-CM | POA: Diagnosis not present

## 2017-02-08 DIAGNOSIS — R05 Cough: Secondary | ICD-10-CM | POA: Diagnosis not present

## 2017-02-08 DIAGNOSIS — G308 Other Alzheimer's disease: Secondary | ICD-10-CM | POA: Diagnosis not present

## 2017-02-08 DIAGNOSIS — M25552 Pain in left hip: Secondary | ICD-10-CM | POA: Diagnosis not present

## 2017-02-08 DIAGNOSIS — E669 Obesity, unspecified: Secondary | ICD-10-CM | POA: Diagnosis not present

## 2017-02-08 DIAGNOSIS — I10 Essential (primary) hypertension: Secondary | ICD-10-CM | POA: Diagnosis not present

## 2017-02-12 ENCOUNTER — Ambulatory Visit: Payer: Medicare Other | Admitting: Internal Medicine

## 2017-02-22 ENCOUNTER — Ambulatory Visit: Payer: Medicare Other | Admitting: Psychology

## 2017-02-22 ENCOUNTER — Ambulatory Visit (INDEPENDENT_AMBULATORY_CARE_PROVIDER_SITE_OTHER): Payer: Medicare Other | Admitting: Psychology

## 2017-02-22 ENCOUNTER — Encounter: Payer: Self-pay | Admitting: Psychology

## 2017-02-22 DIAGNOSIS — R413 Other amnesia: Secondary | ICD-10-CM

## 2017-02-22 DIAGNOSIS — G308 Other Alzheimer's disease: Secondary | ICD-10-CM | POA: Diagnosis not present

## 2017-02-22 DIAGNOSIS — R05 Cough: Secondary | ICD-10-CM | POA: Diagnosis not present

## 2017-02-22 DIAGNOSIS — J40 Bronchitis, not specified as acute or chronic: Secondary | ICD-10-CM | POA: Diagnosis not present

## 2017-02-22 DIAGNOSIS — R233 Spontaneous ecchymoses: Secondary | ICD-10-CM | POA: Diagnosis not present

## 2017-02-22 DIAGNOSIS — K219 Gastro-esophageal reflux disease without esophagitis: Secondary | ICD-10-CM | POA: Diagnosis not present

## 2017-02-22 DIAGNOSIS — E669 Obesity, unspecified: Secondary | ICD-10-CM | POA: Diagnosis not present

## 2017-02-22 DIAGNOSIS — I10 Essential (primary) hypertension: Secondary | ICD-10-CM | POA: Diagnosis not present

## 2017-02-22 DIAGNOSIS — I4891 Unspecified atrial fibrillation: Secondary | ICD-10-CM | POA: Diagnosis not present

## 2017-02-22 NOTE — Progress Notes (Signed)
   Neuropsychology Note  [name] completed 60 minutes of neuropsychological testing with technician, Milana Kidney, BS, under the supervision of Dr. Macarthur Critchley, Licensed Psychologist. The patient did not appear overtly distressed by the testing session, per behavioral observation or via self-report to the technician. Rest breaks were offered.   Clinical Decision Making: In considering the patient's current level of functioning, level of presumed impairment, nature of symptoms, emotional and behavioral responses during the interview, level of literacy, and observed level of motivation/effort, a battery of tests was selected and communicated to the psychometrician.  Communication between the psychologist and technician was ongoing throughout the testing session and changes were made as deemed necessary based on patient performance on testing, technician observations and additional pertinent factors such as those listed above.  [name] will return within approximately 2 weeks for an interactive feedback session with Dr. Si Raider at which time her test performances, clinical impressions and treatment recommendations will be reviewed in detail. The patient understands she can contact our office should she require our assistance before this time.  15 minutes spent performing neuropsychological evaluation services/clinical decision making (psychologist). [CPT 61518] 60 minutes spent face-to-face with patient administering standardized tests, 30 minutes spent scoring (technician). [CPT [id number], 34373]  Full report to follow.
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
## 2017-03-01 ENCOUNTER — Ambulatory Visit (INDEPENDENT_AMBULATORY_CARE_PROVIDER_SITE_OTHER): Payer: Medicare Other | Admitting: Psychology

## 2017-03-01 ENCOUNTER — Encounter: Payer: Self-pay | Admitting: Psychology

## 2017-03-01 DIAGNOSIS — R2689 Other abnormalities of gait and mobility: Secondary | ICD-10-CM | POA: Diagnosis not present

## 2017-03-01 DIAGNOSIS — R413 Other amnesia: Secondary | ICD-10-CM

## 2017-03-01 DIAGNOSIS — R2681 Unsteadiness on feet: Secondary | ICD-10-CM | POA: Diagnosis not present

## 2017-03-01 DIAGNOSIS — R278 Other lack of coordination: Secondary | ICD-10-CM | POA: Diagnosis not present

## 2017-03-01 DIAGNOSIS — R296 Repeated falls: Secondary | ICD-10-CM | POA: Diagnosis not present

## 2017-03-01 DIAGNOSIS — R262 Difficulty in walking, not elsewhere classified: Secondary | ICD-10-CM | POA: Diagnosis not present

## 2017-03-01 DIAGNOSIS — G3184 Mild cognitive impairment, so stated: Secondary | ICD-10-CM

## 2017-03-01 DIAGNOSIS — M6281 Muscle weakness (generalized): Secondary | ICD-10-CM | POA: Diagnosis not present

## 2017-03-01 NOTE — Patient Instructions (Signed)
Recommendations/Plan: In service of optimal brain health, the following recommendations are offered:  1. No more ZZZQuil, Tylenol PM, etc. See attached information on sleep hygiene.  2. Reduce alcohol intake to one 6-oz glass of wine per day.  3. Fall prevention (above recommendations plus PT)  4. Respite and caregiver support for the patient (eg support group)  5. Regular engagement in activities that provide social and mental stimulation  6. Optimal control of vascular risk factors (blood pressure, cholesterol).   7l Consider re-evaluation in 1 year in order to monitor cognitive status and track any changes.

## 2017-03-02 ENCOUNTER — Telehealth: Payer: Self-pay | Admitting: Neurology

## 2017-03-02 DIAGNOSIS — M6281 Muscle weakness (generalized): Secondary | ICD-10-CM | POA: Diagnosis not present

## 2017-03-02 DIAGNOSIS — R296 Repeated falls: Secondary | ICD-10-CM | POA: Diagnosis not present

## 2017-03-02 DIAGNOSIS — R2681 Unsteadiness on feet: Secondary | ICD-10-CM | POA: Diagnosis not present

## 2017-03-02 DIAGNOSIS — R2689 Other abnormalities of gait and mobility: Secondary | ICD-10-CM | POA: Diagnosis not present

## 2017-03-02 DIAGNOSIS — R262 Difficulty in walking, not elsewhere classified: Secondary | ICD-10-CM | POA: Diagnosis not present

## 2017-03-02 DIAGNOSIS — R278 Other lack of coordination: Secondary | ICD-10-CM | POA: Diagnosis not present

## 2017-03-02 NOTE — Telephone Encounter (Signed)
[name] [name] [phone] called said the pt has been on donepezil (ARICEPT) 10 MG tablet for about 1 year. She is wondering with the results from Dr Richrd Sox if she should continue with the medication. She said the pt saw Dr Richrd Sox yesterday.  Please call to advise

## 2017-03-02 NOTE — Telephone Encounter (Signed)
Yes, she should continue the Aricept thanks

## 2017-03-03 DIAGNOSIS — G308 Other Alzheimer's disease: Secondary | ICD-10-CM | POA: Diagnosis not present

## 2017-03-03 DIAGNOSIS — R262 Difficulty in walking, not elsewhere classified: Secondary | ICD-10-CM | POA: Diagnosis not present

## 2017-03-03 DIAGNOSIS — M6281 Muscle weakness (generalized): Secondary | ICD-10-CM | POA: Diagnosis not present

## 2017-03-03 DIAGNOSIS — F3289 Other specified depressive episodes: Secondary | ICD-10-CM | POA: Diagnosis not present

## 2017-03-03 DIAGNOSIS — I10 Essential (primary) hypertension: Secondary | ICD-10-CM | POA: Diagnosis not present

## 2017-03-03 DIAGNOSIS — K219 Gastro-esophageal reflux disease without esophagitis: Secondary | ICD-10-CM | POA: Diagnosis not present

## 2017-03-03 DIAGNOSIS — F320 Major depressive disorder, single episode, mild: Secondary | ICD-10-CM | POA: Diagnosis not present

## 2017-03-03 DIAGNOSIS — R278 Other lack of coordination: Secondary | ICD-10-CM | POA: Diagnosis not present

## 2017-03-03 DIAGNOSIS — R296 Repeated falls: Secondary | ICD-10-CM | POA: Diagnosis not present

## 2017-03-03 DIAGNOSIS — R2689 Other abnormalities of gait and mobility: Secondary | ICD-10-CM | POA: Diagnosis not present

## 2017-03-03 DIAGNOSIS — R2681 Unsteadiness on feet: Secondary | ICD-10-CM | POA: Diagnosis not present

## 2017-03-03 NOTE — Telephone Encounter (Signed)
I called pt's daughter, [name], per [facility]. I advised her that pt should continue aricept, per Dr. Jaynee Eagles. [name] verbalized understanding.

## 2017-03-04 DIAGNOSIS — R262 Difficulty in walking, not elsewhere classified: Secondary | ICD-10-CM | POA: Diagnosis not present

## 2017-03-04 DIAGNOSIS — M6281 Muscle weakness (generalized): Secondary | ICD-10-CM | POA: Diagnosis not present

## 2017-03-04 DIAGNOSIS — R2689 Other abnormalities of gait and mobility: Secondary | ICD-10-CM | POA: Diagnosis not present

## 2017-03-04 DIAGNOSIS — R278 Other lack of coordination: Secondary | ICD-10-CM | POA: Diagnosis not present

## 2017-03-04 DIAGNOSIS — R296 Repeated falls: Secondary | ICD-10-CM | POA: Diagnosis not present

## 2017-03-04 DIAGNOSIS — R2681 Unsteadiness on feet: Secondary | ICD-10-CM | POA: Diagnosis not present

## 2017-03-08 DIAGNOSIS — R296 Repeated falls: Secondary | ICD-10-CM | POA: Diagnosis not present

## 2017-03-08 DIAGNOSIS — R2689 Other abnormalities of gait and mobility: Secondary | ICD-10-CM | POA: Diagnosis not present

## 2017-03-08 DIAGNOSIS — M6281 Muscle weakness (generalized): Secondary | ICD-10-CM | POA: Diagnosis not present

## 2017-03-08 DIAGNOSIS — R262 Difficulty in walking, not elsewhere classified: Secondary | ICD-10-CM | POA: Diagnosis not present

## 2017-03-08 DIAGNOSIS — R278 Other lack of coordination: Secondary | ICD-10-CM | POA: Diagnosis not present

## 2017-03-08 DIAGNOSIS — R2681 Unsteadiness on feet: Secondary | ICD-10-CM | POA: Diagnosis not present

## 2017-03-10 DIAGNOSIS — R262 Difficulty in walking, not elsewhere classified: Secondary | ICD-10-CM | POA: Diagnosis not present

## 2017-03-10 DIAGNOSIS — R2681 Unsteadiness on feet: Secondary | ICD-10-CM | POA: Diagnosis not present

## 2017-03-10 DIAGNOSIS — R2689 Other abnormalities of gait and mobility: Secondary | ICD-10-CM | POA: Diagnosis not present

## 2017-03-10 DIAGNOSIS — E669 Obesity, unspecified: Secondary | ICD-10-CM | POA: Diagnosis not present

## 2017-03-10 DIAGNOSIS — Z79899 Other long term (current) drug therapy: Secondary | ICD-10-CM | POA: Diagnosis not present

## 2017-03-10 DIAGNOSIS — R198 Other specified symptoms and signs involving the digestive system and abdomen: Secondary | ICD-10-CM | POA: Diagnosis not present

## 2017-03-10 DIAGNOSIS — M6281 Muscle weakness (generalized): Secondary | ICD-10-CM | POA: Diagnosis not present

## 2017-03-10 DIAGNOSIS — I4891 Unspecified atrial fibrillation: Secondary | ICD-10-CM | POA: Diagnosis not present

## 2017-03-10 DIAGNOSIS — G3184 Mild cognitive impairment, so stated: Secondary | ICD-10-CM | POA: Diagnosis not present

## 2017-03-10 DIAGNOSIS — K921 Melena: Secondary | ICD-10-CM | POA: Diagnosis not present

## 2017-03-10 DIAGNOSIS — R278 Other lack of coordination: Secondary | ICD-10-CM | POA: Diagnosis not present

## 2017-03-10 DIAGNOSIS — I10 Essential (primary) hypertension: Secondary | ICD-10-CM | POA: Diagnosis not present

## 2017-03-10 DIAGNOSIS — R296 Repeated falls: Secondary | ICD-10-CM | POA: Diagnosis not present

## 2017-03-11 DIAGNOSIS — M6281 Muscle weakness (generalized): Secondary | ICD-10-CM | POA: Diagnosis not present

## 2017-03-11 DIAGNOSIS — R296 Repeated falls: Secondary | ICD-10-CM | POA: Diagnosis not present

## 2017-03-11 DIAGNOSIS — R262 Difficulty in walking, not elsewhere classified: Secondary | ICD-10-CM | POA: Diagnosis not present

## 2017-03-11 DIAGNOSIS — R2681 Unsteadiness on feet: Secondary | ICD-10-CM | POA: Diagnosis not present

## 2017-03-11 DIAGNOSIS — R2689 Other abnormalities of gait and mobility: Secondary | ICD-10-CM | POA: Diagnosis not present

## 2017-03-11 DIAGNOSIS — R278 Other lack of coordination: Secondary | ICD-10-CM | POA: Diagnosis not present

## 2017-03-15 ENCOUNTER — Encounter: Payer: Self-pay | Admitting: Internal Medicine

## 2017-03-15 ENCOUNTER — Ambulatory Visit (INDEPENDENT_AMBULATORY_CARE_PROVIDER_SITE_OTHER): Payer: Medicare Other | Admitting: Internal Medicine

## 2017-03-15 VITALS — BP 142/68 | HR 70 | Ht 68.5 in | Wt 183.4 lb

## 2017-03-15 DIAGNOSIS — R278 Other lack of coordination: Secondary | ICD-10-CM | POA: Diagnosis not present

## 2017-03-15 DIAGNOSIS — R195 Other fecal abnormalities: Secondary | ICD-10-CM

## 2017-03-15 DIAGNOSIS — K2100 Gastro-esophageal reflux disease with esophagitis, without bleeding: Secondary | ICD-10-CM

## 2017-03-15 DIAGNOSIS — M6281 Muscle weakness (generalized): Secondary | ICD-10-CM | POA: Diagnosis not present

## 2017-03-15 DIAGNOSIS — Z86010 Personal history of colonic polyps: Secondary | ICD-10-CM | POA: Diagnosis not present

## 2017-03-15 DIAGNOSIS — R262 Difficulty in walking, not elsewhere classified: Secondary | ICD-10-CM | POA: Diagnosis not present

## 2017-03-15 DIAGNOSIS — R197 Diarrhea, unspecified: Secondary | ICD-10-CM | POA: Diagnosis not present

## 2017-03-15 DIAGNOSIS — R296 Repeated falls: Secondary | ICD-10-CM | POA: Diagnosis not present

## 2017-03-15 DIAGNOSIS — R2681 Unsteadiness on feet: Secondary | ICD-10-CM | POA: Diagnosis not present

## 2017-03-15 DIAGNOSIS — R2689 Other abnormalities of gait and mobility: Secondary | ICD-10-CM | POA: Diagnosis not present

## 2017-03-15 NOTE — Patient Instructions (Signed)
Please follow up as needed 

## 2017-03-15 NOTE — Progress Notes (Signed)
HISTORY OF PRESENT ILLNESS:  [name] is a 84 y.o. female with multiple significant medical problems as listed below who is referred today by her current primary care provider Cynthia Shell, FNP with chief complaint of Hemoccult-positive stool. The patient is accompanied today by her daughter. I have reviewed outside records from Dr. Jacquiline West office including blood work. Also recent radiology. The patient has not been seen in this office since October 2015 when she underwent colonoscopy to evaluate rectal bleeding. Colonoscopy in 2005 was negative for neoplasia. The most recent colonoscopy October 2015 revealed multiple subcentimeter polyps which were removed and found to be adenomatous. No routine follow-up secondary to age recommended. Other findings included left-sided diverticulosis. The examination was complete with excellent preparation and photodocumentation of all landmarks. Patient also underwent upper endoscopy in March 2014 to evaluate abdominal pain. She was found to have reflux esophagitis as well as multiple antral erosions and a small prepyloric ulcer. She was using NSAIDs at the time. Testing for Helicobacter pylori was negative. She was placed on omeprazole 40 mg daily. Patient reports loose stools which began approximately 1 year ago. This after initiating Aricept. Also, she takes Ex-Lax every night. She denies melena or hematochezia. She has had weight gain. Only other GI complaint is bloating. Review of outside laboratories from Dr. Jacquiline West office October 2018 reveals normal hemoglobin of 14.7. MCV 99.2. Comprehensive metabolic panel unremarkable. Hemoccult studies positive 2. Recent repeat Hemoccults with her new provider were +203. The patient states that she is compliant with omeprazole 40 mg daily. CT scan of the abdomen and pelvis with contrast April 2018 to evaluate lower abdominal pain and constipation was negative for acute findings. The patient now lives at [place]:  All non-GI ROS negative unless otherwise stated in the history of present illness except for arthritis, back pain, visual change, confusion, cough, itching, sleeping problems, excessive thirst, hearing problems, excessive urination  Past Medical History:  Diagnosis Date  . Arthritis   . Atrial fibrillation ([place])   . Cataract    BILATERAL  . DDD (degenerative disc disease), cervical   . Depression   . Diverticulosis   . Falls   . GERD (gastroesophageal reflux disease)   . Glaucoma   . Headache   . Hyperlipidemia   . Hyperlipidemia   . Hypertension   . Osteoporosis   . Peripheral neuropathy   . Spondylosis    lumbar  . Subdural hematoma [facility])     Past Surgical History:  Procedure Laterality Date  . APPENDECTOMY    . BACK SURGERY  2010  . BURR HOLE FOR SUBDURAL HEMATOMA     x 2 hematoma  . COLONOSCOPY    . KNEE SURGERY Bilateral   . VAGINAL HYSTERECTOMY      Social History [name]  reports that  has never smoked. she has never used smokeless tobacco. She reports that she drinks about 8.4 oz of alcohol per week. She reports that she does not use drugs.  family history includes Breast cancer in her sister; Heart disease in her father; Ovarian cancer in her mother.  Allergies  Allergen Reactions  . Codeine Other (See Comments)    unknown       PHYSICAL EXAMINATION: Vital signs: BP (!) 142/68   Pulse 70   Ht 5' 8.5" (1.74 m)   Wt 183 lb 6.4 oz (83.2 kg)   BMI 27.48 kg/m   Constitutional: Pleasant, elderly, generally well-appearing, no acute distress  Psychiatric: alert and oriented x3, cooperative Eyes: extraocular movements intact, anicteric, conjunctiva pink Mouth: oral pharynx moist, no lesions Neck: supple without thyromegaly Lymph. no lymphadenopathy Cardiovascular: heart regular rate and rhythm, no murmur Lungs: clear to auscultation bilaterally Abdomen: soft, nontender, nondistended, no obvious ascites, no peritoneal  signs, normal bowel sounds, no organomegaly Rectal: Omitted Extremities: no clubbing or cyanosis. 1+ lower extremity edema bilaterally Skin: no lesions on visible extremities Neuro: No focal deficits. Cranial nerves intact  ASSESSMENT:  #1. Hemoccult-positive stool. Uncertain clinical significance. No alarm features. Normal hemoglobin. #2. Multiple diminutive adenomas on colonoscopy October 2015 #3. GERD with esophagitis on endoscopy 2014 #4. Antral erosions and small ulcer of the stomach on endoscopy 2014 #5. Advanced age and multiple medical problems   PLAN:  #1. We discussed the role of Hemoccult testing for routine screening in patients that have been enrolled in surveillance programs. We also discussed the significance of Hemoccult testing including reasons for true positive (cancer, precancerous neoplasia, AVMs, mucosal erosive disease) and false negatives. We also discussed her previous GI evaluations. We discussed the risks and potential benefits of an endoscopic workup of asymptomatic Hemoccult-positive stool with normal hemoglobin and prior endoscopic evaluations. After AN EXTENSIVE and balanced evaluation, the patient has elected not to proceed with endoscopic workup. Of course, she has left open the possibility of being evaluated should new issues arise. I think that is reasonable. #2. In terms of her loose stools, first measure will be to stop her at night laxative. If this does not work, then she may speak with her prescribing physician about temporarily holding Aricept to see if this helps. As well, she could introduce daily fiber such as Metamucil. #3. Continue omeprazole daily. This for upper GI mucosal protection #4. Return to the care of primary provider. GI follow-up as needed  45 minutes spent face-to-face with the patient. Greater than 50% a time use for counseling regarding her Hemoccult-positive stool and the rationale behind recommendations as outlined. Multiple  appropriate questions for the patient and her daughter answered to their satisfaction.  A copy of this consultation note has been sent to Ms. [employer]

## 2017-03-16 DIAGNOSIS — R2689 Other abnormalities of gait and mobility: Secondary | ICD-10-CM | POA: Diagnosis not present

## 2017-03-16 DIAGNOSIS — R296 Repeated falls: Secondary | ICD-10-CM | POA: Diagnosis not present

## 2017-03-16 DIAGNOSIS — R262 Difficulty in walking, not elsewhere classified: Secondary | ICD-10-CM | POA: Diagnosis not present

## 2017-03-16 DIAGNOSIS — R278 Other lack of coordination: Secondary | ICD-10-CM | POA: Diagnosis not present

## 2017-03-16 DIAGNOSIS — M6281 Muscle weakness (generalized): Secondary | ICD-10-CM | POA: Diagnosis not present

## 2017-03-16 DIAGNOSIS — R2681 Unsteadiness on feet: Secondary | ICD-10-CM | POA: Diagnosis not present

## 2017-03-17 DIAGNOSIS — R2689 Other abnormalities of gait and mobility: Secondary | ICD-10-CM | POA: Diagnosis not present

## 2017-03-17 DIAGNOSIS — R2681 Unsteadiness on feet: Secondary | ICD-10-CM | POA: Diagnosis not present

## 2017-03-17 DIAGNOSIS — R278 Other lack of coordination: Secondary | ICD-10-CM | POA: Diagnosis not present

## 2017-03-17 DIAGNOSIS — M6281 Muscle weakness (generalized): Secondary | ICD-10-CM | POA: Diagnosis not present

## 2017-03-17 DIAGNOSIS — R296 Repeated falls: Secondary | ICD-10-CM | POA: Diagnosis not present

## 2017-03-17 DIAGNOSIS — R262 Difficulty in walking, not elsewhere classified: Secondary | ICD-10-CM | POA: Diagnosis not present

## 2017-03-18 DIAGNOSIS — R262 Difficulty in walking, not elsewhere classified: Secondary | ICD-10-CM | POA: Diagnosis not present

## 2017-03-18 DIAGNOSIS — M6281 Muscle weakness (generalized): Secondary | ICD-10-CM | POA: Diagnosis not present

## 2017-03-18 DIAGNOSIS — R2681 Unsteadiness on feet: Secondary | ICD-10-CM | POA: Diagnosis not present

## 2017-03-18 DIAGNOSIS — R278 Other lack of coordination: Secondary | ICD-10-CM | POA: Diagnosis not present

## 2017-03-18 DIAGNOSIS — R2689 Other abnormalities of gait and mobility: Secondary | ICD-10-CM | POA: Diagnosis not present

## 2017-03-18 DIAGNOSIS — R296 Repeated falls: Secondary | ICD-10-CM | POA: Diagnosis not present

## 2017-03-22 ENCOUNTER — Telehealth: Payer: Self-pay | Admitting: Internal Medicine

## 2017-03-22 DIAGNOSIS — R2681 Unsteadiness on feet: Secondary | ICD-10-CM | POA: Diagnosis not present

## 2017-03-22 DIAGNOSIS — R296 Repeated falls: Secondary | ICD-10-CM | POA: Diagnosis not present

## 2017-03-22 DIAGNOSIS — M6281 Muscle weakness (generalized): Secondary | ICD-10-CM | POA: Diagnosis not present

## 2017-03-22 DIAGNOSIS — R262 Difficulty in walking, not elsewhere classified: Secondary | ICD-10-CM | POA: Diagnosis not present

## 2017-03-22 NOTE — Telephone Encounter (Signed)
Patient daughter states patient forgot to ask at appt on 2.25.19 what over the counter medication she can take for gas.

## 2017-03-22 NOTE — Telephone Encounter (Signed)
Spoke with pts daughter and let her know she can take phayzyme or gas-x. Suggested she eat foods that do not produce as much gas. Daughter verbalized understanding.

## 2017-03-26 DIAGNOSIS — R262 Difficulty in walking, not elsewhere classified: Secondary | ICD-10-CM | POA: Diagnosis not present

## 2017-03-26 DIAGNOSIS — R2681 Unsteadiness on feet: Secondary | ICD-10-CM | POA: Diagnosis not present

## 2017-03-26 DIAGNOSIS — M6281 Muscle weakness (generalized): Secondary | ICD-10-CM | POA: Diagnosis not present

## 2017-03-26 DIAGNOSIS — R296 Repeated falls: Secondary | ICD-10-CM | POA: Diagnosis not present

## 2017-03-29 DIAGNOSIS — R296 Repeated falls: Secondary | ICD-10-CM | POA: Diagnosis not present

## 2017-03-29 DIAGNOSIS — R262 Difficulty in walking, not elsewhere classified: Secondary | ICD-10-CM | POA: Diagnosis not present

## 2017-03-29 DIAGNOSIS — M6281 Muscle weakness (generalized): Secondary | ICD-10-CM | POA: Diagnosis not present

## 2017-03-29 DIAGNOSIS — R2681 Unsteadiness on feet: Secondary | ICD-10-CM | POA: Diagnosis not present

## 2017-03-31 DIAGNOSIS — R05 Cough: Secondary | ICD-10-CM | POA: Diagnosis not present

## 2017-03-31 DIAGNOSIS — K921 Melena: Secondary | ICD-10-CM | POA: Diagnosis not present

## 2017-03-31 DIAGNOSIS — R198 Other specified symptoms and signs involving the digestive system and abdomen: Secondary | ICD-10-CM | POA: Diagnosis not present

## 2017-03-31 DIAGNOSIS — I10 Essential (primary) hypertension: Secondary | ICD-10-CM | POA: Diagnosis not present

## 2017-03-31 DIAGNOSIS — E669 Obesity, unspecified: Secondary | ICD-10-CM | POA: Diagnosis not present

## 2017-04-01 DIAGNOSIS — R05 Cough: Secondary | ICD-10-CM | POA: Diagnosis not present

## 2017-04-14 DIAGNOSIS — R05 Cough: Secondary | ICD-10-CM | POA: Diagnosis not present

## 2017-04-14 DIAGNOSIS — Z6829 Body mass index (BMI) 29.0-29.9, adult: Secondary | ICD-10-CM | POA: Diagnosis not present

## 2017-04-14 DIAGNOSIS — J209 Acute bronchitis, unspecified: Secondary | ICD-10-CM | POA: Diagnosis not present

## 2017-04-26 ENCOUNTER — Encounter: Payer: Medicare Other | Admitting: Psychology

## 2017-05-07 DIAGNOSIS — R600 Localized edema: Secondary | ICD-10-CM | POA: Diagnosis not present

## 2017-05-07 DIAGNOSIS — M79672 Pain in left foot: Secondary | ICD-10-CM | POA: Diagnosis not present

## 2017-05-12 DIAGNOSIS — S92355A Nondisplaced fracture of fifth metatarsal bone, left foot, initial encounter for closed fracture: Secondary | ICD-10-CM | POA: Diagnosis not present

## 2017-05-12 DIAGNOSIS — M19072 Primary osteoarthritis, left ankle and foot: Secondary | ICD-10-CM | POA: Diagnosis not present

## 2017-05-12 DIAGNOSIS — Z9181 History of falling: Secondary | ICD-10-CM | POA: Diagnosis not present

## 2017-06-02 DIAGNOSIS — S92355D Nondisplaced fracture of fifth metatarsal bone, left foot, subsequent encounter for fracture with routine healing: Secondary | ICD-10-CM | POA: Diagnosis not present

## 2017-06-09 ENCOUNTER — Encounter (HOSPITAL_COMMUNITY): Payer: Self-pay | Admitting: Emergency Medicine

## 2017-06-09 ENCOUNTER — Emergency Department (HOSPITAL_COMMUNITY): Payer: Medicare Other

## 2017-06-09 ENCOUNTER — Emergency Department (HOSPITAL_COMMUNITY)
Admission: EM | Admit: 2017-06-09 | Discharge: 2017-06-09 | Disposition: A | Discharge status: Home / Self Care | Payer: Medicare Other | Attending: Emergency Medicine | Admitting: Emergency Medicine

## 2017-06-09 DIAGNOSIS — Y939 Activity, unspecified: Secondary | ICD-10-CM | POA: Diagnosis not present

## 2017-06-09 DIAGNOSIS — Y929 Unspecified place or not applicable: Secondary | ICD-10-CM | POA: Diagnosis not present

## 2017-06-09 DIAGNOSIS — Y999 Unspecified external cause status: Secondary | ICD-10-CM | POA: Diagnosis not present

## 2017-06-09 DIAGNOSIS — S098XXA Other specified injuries of head, initial encounter: Secondary | ICD-10-CM | POA: Diagnosis not present

## 2017-06-09 DIAGNOSIS — R51 Headache: Secondary | ICD-10-CM | POA: Diagnosis not present

## 2017-06-09 DIAGNOSIS — S0181XA Laceration without foreign body of other part of head, initial encounter: Secondary | ICD-10-CM | POA: Insufficient documentation

## 2017-06-09 DIAGNOSIS — S0990XA Unspecified injury of head, initial encounter: Secondary | ICD-10-CM | POA: Diagnosis not present

## 2017-06-09 DIAGNOSIS — W19XXXA Unspecified fall, initial encounter: Secondary | ICD-10-CM

## 2017-06-09 MED ORDER — LIDOCAINE-EPINEPHRINE 1 %-1:100000 IJ SOLN
30.0000 mL | Freq: Once | INTRAMUSCULAR | Status: AC
  Administered 2017-06-09: 30 mL via INTRADERMAL
  Filled 2017-06-09 (×3): qty 30

## 2017-06-09 NOTE — ED Provider Notes (Signed)
Patient under care of Dr. Vanita Panda. Please see his note for full H&P. Briefly here for mechanical fall with large laceration to central forehead. No anticoagulants.  Physical Exam  BP (!) 166/93 (BP Location: Left Arm)   Pulse (!) 102   Resp 18   SpO2 100%   Physical Exam  Constitutional: She is oriented to person, place, and time. No distress.  HENT:  10 cm curved laceration to central forehead above eyebrow, oozing blood with surrounding edema and mild ecchymosis.  Eyes:  PERRL and EOMs intact bilaterally.   Cardiovascular: Normal rate.  Pulmonary/Chest: Effort normal.  Neurological: She is alert and oriented to person, place, and time.  CN I and II not tested CN III, IV, VI PEERL and EOMs intact bilaterally Decreased sensation to pin/prick to skin above laceration otherwise CN V light touch intact in all 3 divisions of trigeminal nerve CN VII facial nerve movements intact, symmetric, bilaterally  Skin: Capillary refill takes less than 2 seconds.  Psychiatric: She has a normal mood and affect.    ED Course/Procedures     .Marland KitchenLaceration Repair Date/Time: 06/09/2017 12:15 PM Performed by: Kinnie Feil, PA-C Authorized by: Kinnie Feil, PA-C   Consent:    Consent obtained:  Verbal   Consent given by:  Patient   Risks discussed:  Infection, poor cosmetic result and pain   Alternatives discussed:  Referral and delayed treatment Anesthesia (see MAR for exact dosages):    Anesthesia method:  Local infiltration   Local anesthetic:  Lidocaine 2% WITH epi Laceration details:    Location:  Face   Face location:  Forehead   Length (cm):  10 Repair type:    Repair type:  Intermediate Pre-procedure details:    Preparation:  Patient was prepped and draped in usual sterile fashion Exploration:    Hemostasis achieved with:  Epinephrine and direct pressure   Wound exploration: wound explored through full range of motion and entire depth of wound probed and visualized    Wound extent: nerve damage (decreased sensation)     Contaminated: no   Treatment:    Area cleansed with:  Betadine and saline   Amount of cleaning:  Standard   Irrigation solution:  Sterile saline   Irrigation volume:  200   Irrigation method:  Syringe and tap   Visualized foreign bodies/material removed: no   Skin repair:    Repair method:  Sutures   Suture size:  5-0   Wound skin closure material used: ethilon.   Suture technique:  Simple interrupted   Number of sutures:  11 Approximation:    Approximation:  Close Post-procedure details:    Dressing:  Antibiotic ointment, non-adherent dressing and bulky dressing   Patient tolerance of procedure:  Tolerated well, no immediate complications    MDM   Decreased sensation to area above laceration pre and post procedure, ?nerve vs muscle damage No obvious muscle or nerve damage noted or amenable for repair during exam Laceration hemostatic after procedure  Wound care instructions given Disposition per Dr Lynnette Caffey, Orson Gear, PA-C 06/09/17 1218    Carmin Muskrat, MD 06/09/17 [phone]

## 2017-06-09 NOTE — ED Triage Notes (Signed)
Pt arrives by [facility] from [facility] after a fall. Pt reports no LOC. Pt has large laceration above left eye. Bleeding controlled at this time.

## 2017-06-10 DIAGNOSIS — R2689 Other abnormalities of gait and mobility: Secondary | ICD-10-CM | POA: Diagnosis not present

## 2017-06-10 DIAGNOSIS — M1990 Unspecified osteoarthritis, unspecified site: Secondary | ICD-10-CM | POA: Diagnosis not present

## 2017-06-10 DIAGNOSIS — S0181XA Laceration without foreign body of other part of head, initial encounter: Secondary | ICD-10-CM | POA: Diagnosis not present

## 2017-06-10 DIAGNOSIS — Z6828 Body mass index (BMI) 28.0-28.9, adult: Secondary | ICD-10-CM | POA: Diagnosis not present

## 2017-06-10 DIAGNOSIS — R296 Repeated falls: Secondary | ICD-10-CM | POA: Diagnosis not present

## 2017-06-16 DIAGNOSIS — S0093XD Contusion of unspecified part of head, subsequent encounter: Secondary | ICD-10-CM | POA: Diagnosis not present

## 2017-06-16 DIAGNOSIS — Z6829 Body mass index (BMI) 29.0-29.9, adult: Secondary | ICD-10-CM | POA: Diagnosis not present

## 2017-06-29 ENCOUNTER — Encounter: Payer: Self-pay | Admitting: Internal Medicine

## 2017-06-29 ENCOUNTER — Ambulatory Visit (INDEPENDENT_AMBULATORY_CARE_PROVIDER_SITE_OTHER): Payer: Medicare Other | Admitting: Internal Medicine

## 2017-06-29 ENCOUNTER — Other Ambulatory Visit (INDEPENDENT_AMBULATORY_CARE_PROVIDER_SITE_OTHER): Payer: Medicare Other

## 2017-06-29 VITALS — BP 130/84 | HR 96 | Ht 66.0 in | Wt 180.1 lb

## 2017-06-29 DIAGNOSIS — R109 Unspecified abdominal pain: Secondary | ICD-10-CM | POA: Diagnosis not present

## 2017-06-29 DIAGNOSIS — R194 Change in bowel habit: Secondary | ICD-10-CM | POA: Diagnosis not present

## 2017-06-29 DIAGNOSIS — R140 Abdominal distension (gaseous): Secondary | ICD-10-CM | POA: Diagnosis not present

## 2017-06-29 DIAGNOSIS — R143 Flatulence: Secondary | ICD-10-CM | POA: Diagnosis not present

## 2017-06-29 LAB — CBC WITH DIFFERENTIAL/PLATELET
BASOS ABS: 0 10*3/uL (ref 0.0–0.1)
BASOS PCT: 1 % (ref 0.0–3.0)
Eosinophils Absolute: 0.1 10*3/uL (ref 0.0–0.7)
Eosinophils Relative: 1.8 % (ref 0.0–5.0)
HEMATOCRIT: 38 % (ref 36.0–46.0)
Hemoglobin: 12.9 g/dL (ref 12.0–15.0)
LYMPHS ABS: 1.3 10*3/uL (ref 0.7–4.0)
LYMPHS PCT: 27.2 % (ref 12.0–46.0)
MCHC: 34 g/dL (ref 30.0–36.0)
MCV: 97.6 fl (ref 78.0–100.0)
Monocytes Absolute: 0.5 10*3/uL (ref 0.1–1.0)
Monocytes Relative: 9.9 % (ref 3.0–12.0)
NEUTROS ABS: 3 10*3/uL (ref 1.4–7.7)
Neutrophils Relative %: 60.1 % (ref 43.0–77.0)
PLATELETS: 263 10*3/uL (ref 150.0–400.0)
RBC: 3.89 Mil/uL (ref 3.87–5.11)
RDW: 13.5 % (ref 11.5–15.5)
WBC: 4.9 10*3/uL (ref 4.0–10.5)

## 2017-06-29 LAB — COMPREHENSIVE METABOLIC PANEL
ALT: 12 U/L (ref 0–35)
AST: 12 U/L (ref 0–37)
Albumin: 4.1 g/dL (ref 3.5–5.2)
Alkaline Phosphatase: 66 U/L (ref 39–117)
BILIRUBIN TOTAL: 0.3 mg/dL (ref 0.2–1.2)
BUN: 19 mg/dL (ref 6–23)
CALCIUM: 9.5 mg/dL (ref 8.4–10.5)
CO2: 30 meq/L (ref 19–32)
Chloride: 101 mEq/L (ref 96–112)
Creatinine, Ser: 1.16 mg/dL (ref 0.40–1.20)
GFR: 47.48 mL/min — AB (ref >60.00)
GLUCOSE: 112 mg/dL — AB (ref 70–99)
POTASSIUM: 3.8 meq/L (ref 3.5–5.1)
Sodium: 137 mEq/L (ref 135–145)
Total Protein: 6.6 g/dL (ref 6.0–8.3)

## 2017-06-29 NOTE — Patient Instructions (Signed)
Your provider has requested that you go to the basement level for lab work before leaving today. Press "B" on the elevator. The lab is located at the first door on the left as you exit the elevator.  You have been scheduled for an abdominal ultrasound at [facility] (1st floor of hospital) on 6/142019 at 10:00am. Please arrive 15 minutes prior to your appointment for registration. Make certain not to have anything to eat or drink 6 hours prior to your appointment. Should you need to reschedule your appointment, please contact radiology at [phone]. This test typically takes about 30 minutes to perform.  Make sure to take Omeprazole every day  Do not take Mobic or NSAIDS

## 2017-06-30 ENCOUNTER — Encounter: Payer: Self-pay | Admitting: Internal Medicine

## 2017-06-30 NOTE — Progress Notes (Signed)
HISTORY OF PRESENT ILLNESS:  [name] is a 84 y.o. female With multiple significant medical problems who presents today with complaints of abdominal bloating discomfort, belching, and change in stool color. She was last evaluated in this office 03/15/2017. See that dictation for details. She was being evaluated at that time for Hemoccult-positive stool of uncertain clinical significance with no alarm features and normal hemoglobin. Having had prior colonoscopy in 201 and upper endoscopy in 2014. As described due to her age and comorbidities no endoscopic evaluation was recommended. The patient was also having problems with loose stools which have improved somewhat off of laxatives and Aricept. Current history is that of intermittent abdominal bloating and belching which she has had off-and-on for years. She also describes "horrible gas". She is in an assisted living facility/retirement home. She's not certain about her medications. Of importance it states that she is on Kiskimere. Also states that she is on Prilosec.  REVIEW OF SYSTEMS:  All non-GI ROS negative except for arthritis, visual change, confusion, cough, itching, muscle cramps, sleeping problems, increased thirst, increased urination  Past Medical History:  Diagnosis Date  . Arthritis   . Atrial fibrillation ([place])   . Cataract    BILATERAL  . DDD (degenerative disc disease), cervical   . Depression   . Diverticulosis   . Falls   . GERD (gastroesophageal reflux disease)   . Glaucoma   . Headache   . Hyperlipidemia   . Hyperlipidemia   . Hypertension   . Osteoporosis   . Peripheral neuropathy   . Spondylosis    lumbar  . Subdural hematoma [facility])     Past Surgical History:  Procedure Laterality Date  . APPENDECTOMY    . BACK SURGERY  2010  . BURR HOLE FOR SUBDURAL HEMATOMA     x 2 hematoma  . COLONOSCOPY    . KNEE SURGERY Bilateral   . VAGINAL HYSTERECTOMY      Social History [name]  reports that she has  never smoked. She has never used smokeless tobacco. She reports that she drinks about 8.4 oz of alcohol per week. She reports that she does not use drugs.  family history includes Breast cancer in her sister; Heart disease in her father; Ovarian cancer in her mother.  Allergies  Allergen Reactions  . Codeine Other (See Comments)    unknown       PHYSICAL EXAMINATION: Vital signs: BP 130/84 (BP Location: Left Arm, Patient Position: Sitting, Cuff Size: Normal)   Pulse 96 Comment: irregular  Ht 5\' 6"  (1.676 m)   Wt 180 lb 2 oz (81.7 kg)   BMI 29.07 kg/m   Constitutional: generally well-appearing, no acute distress Psychiatric: alert and oriented x3, cooperative Eyes: extraocular movements intact, anicteric, conjunctiva pink Mouth: oral pharynx moist, no lesions Neck: supple no lymphadenopathy Cardiovascular: heart regular rate and rhythm, no murmur Lungs: clear to auscultation bilaterally Abdomen: soft, nontender, nondistended, no obvious ascites, no peritoneal signs, normal bowel sounds, no organomegaly Rectal:omitted Extremities: no clubbing, cyanosis, or lower extremity edema bilaterally Skin: no lesions on visible extremities Neuro: No focal deficits. Cranial nerves intact  ASSESSMENT:  #1. Abdominal bloating with belching and gas. No alarm features. #2. Change in stool color. Dark green. #3. Multiple significant medical problems  PLAN:  #1. CBC and comp [place] metabolic panel today #2. Abdominal ultrasound #3. Avoid meloxicam and NSAIDs. Tylenol okay for pain #4. Continue omeprazole 40 mg daily indefinitely to protect upper GI mucosa #5. Recommendations printed  on her patient she for review at her retirement facility #6. Probiotic once daily for one month to address issues with gas #7. Routine GI follow-up as needed  25 minutes spent face-to-face with the patient. Greater than 50% a time use for counseling regarding her problems with intestinal gas associated with  abdominal discomfort and change in stool color. As well recommendations regarding her medications and the rationale behind the recommendations.

## 2017-07-02 ENCOUNTER — Ambulatory Visit (HOSPITAL_COMMUNITY)
Admission: RE | Admit: 2017-07-02 | Discharge: 2017-07-02 | Disposition: A | Discharge status: Home / Self Care | Payer: Medicare Other | Source: Ambulatory Visit | Attending: Internal Medicine | Admitting: Internal Medicine

## 2017-07-02 DIAGNOSIS — R109 Unspecified abdominal pain: Secondary | ICD-10-CM

## 2017-07-02 DIAGNOSIS — K760 Fatty (change of) liver, not elsewhere classified: Secondary | ICD-10-CM | POA: Insufficient documentation

## 2017-07-02 DIAGNOSIS — R194 Change in bowel habit: Secondary | ICD-10-CM | POA: Insufficient documentation

## 2017-07-06 DIAGNOSIS — M6281 Muscle weakness (generalized): Secondary | ICD-10-CM | POA: Diagnosis not present

## 2017-07-06 DIAGNOSIS — R2681 Unsteadiness on feet: Secondary | ICD-10-CM | POA: Diagnosis not present

## 2017-07-06 DIAGNOSIS — Z9181 History of falling: Secondary | ICD-10-CM | POA: Diagnosis not present

## 2017-07-06 DIAGNOSIS — R278 Other lack of coordination: Secondary | ICD-10-CM | POA: Diagnosis not present

## 2017-07-06 DIAGNOSIS — R296 Repeated falls: Secondary | ICD-10-CM | POA: Diagnosis not present

## 2017-07-07 DIAGNOSIS — R296 Repeated falls: Secondary | ICD-10-CM | POA: Diagnosis not present

## 2017-07-07 DIAGNOSIS — Z9181 History of falling: Secondary | ICD-10-CM | POA: Diagnosis not present

## 2017-07-07 DIAGNOSIS — R278 Other lack of coordination: Secondary | ICD-10-CM | POA: Diagnosis not present

## 2017-07-07 DIAGNOSIS — R2681 Unsteadiness on feet: Secondary | ICD-10-CM | POA: Diagnosis not present

## 2017-07-07 DIAGNOSIS — M6281 Muscle weakness (generalized): Secondary | ICD-10-CM | POA: Diagnosis not present

## 2017-07-13 DIAGNOSIS — Z9181 History of falling: Secondary | ICD-10-CM | POA: Diagnosis not present

## 2017-07-13 DIAGNOSIS — R2681 Unsteadiness on feet: Secondary | ICD-10-CM | POA: Diagnosis not present

## 2017-07-13 DIAGNOSIS — R296 Repeated falls: Secondary | ICD-10-CM | POA: Diagnosis not present

## 2017-07-13 DIAGNOSIS — M6281 Muscle weakness (generalized): Secondary | ICD-10-CM | POA: Diagnosis not present

## 2017-07-13 DIAGNOSIS — R278 Other lack of coordination: Secondary | ICD-10-CM | POA: Diagnosis not present

## 2017-07-16 DIAGNOSIS — Z9181 History of falling: Secondary | ICD-10-CM | POA: Diagnosis not present

## 2017-07-16 DIAGNOSIS — M6281 Muscle weakness (generalized): Secondary | ICD-10-CM | POA: Diagnosis not present

## 2017-07-16 DIAGNOSIS — R2681 Unsteadiness on feet: Secondary | ICD-10-CM | POA: Diagnosis not present

## 2017-07-16 DIAGNOSIS — R278 Other lack of coordination: Secondary | ICD-10-CM | POA: Diagnosis not present

## 2017-07-16 DIAGNOSIS — R296 Repeated falls: Secondary | ICD-10-CM | POA: Diagnosis not present

## 2017-07-26 ENCOUNTER — Ambulatory Visit (INDEPENDENT_AMBULATORY_CARE_PROVIDER_SITE_OTHER): Payer: Medicare Other | Admitting: Neurology

## 2017-07-26 ENCOUNTER — Encounter: Payer: Self-pay | Admitting: Neurology

## 2017-07-26 VITALS — BP 145/85 | HR 62 | Ht 68.5 in | Wt 184.0 lb

## 2017-07-26 DIAGNOSIS — G3184 Mild cognitive impairment, so stated: Secondary | ICD-10-CM

## 2017-07-26 DIAGNOSIS — W19XXXD Unspecified fall, subsequent encounter: Secondary | ICD-10-CM

## 2017-07-26 NOTE — Patient Instructions (Signed)
Mild Neurocognitive Disorder Mild neurocognitive disorder (formerly known as mild cognitive impairment) is a mental disorder. It is a slight abnormal decrease in mental function. The areas of mental function affected may include memory, thought, communication, behavior, and completion of tasks. The decrease is noticeable and measurable but for the most part does not interfere with your daily activities. Mild neurocognitive disorder typically occurs in people older than 60 years but can occur earlier. It is not as serious as major neurocognitive disorder (formerly known as dementia) but may lead to a more serious neurocognitive disorder. However, in some cases the condition does not get worse. A few people with this disorder even improve. What are the causes? There are a number of different causes of mild neurocognitive disorder:  Brain disorders associated with abnormal protein deposits, such as Alzheimer's disease, Pick's disease, and Lewy body disease.  Brain disorders associated with abnormal movement, such as Parkinson's disease and Huntington's disease.  Diseases affecting blood vessels in the brain and resulting in mini-strokes.  Certain infections, such as human immunodeficiency virus (HIV) infection.  Traumatic brain injury.  Other medical conditions such as brain tumors, underactive thyroid (hypothyroidism), and vitamin B12 deficiency.  Use of certain prescription medicine and "recreational" drugs.  What are the signs or symptoms? Symptoms of mild neurocognitive disorder include:  Difficulty remembering. You may forget details of recent events, names, or phone numbers. You may forget important social events and appointments or repeatedly forget where you put your car keys.  Difficulty thinking and solving problems. You may have trouble with complex tasks such as paying bills or driving in unfamiliar locations.  Difficulty communicating. You may have trouble finding the right word,  naming an object, forming a sentence that makes sense, or understanding what you read or hear.  Changes in your behavior or personality. You may lose interest in the things that you used to enjoy or withdraw from social situations. You may get angry more easily than usual. You may act before thinking. You may do things in public that you would not usually do. You may hear or see things that are not real (hallucinations). You may believe falsely that others are trying to hurt you (paranoia).  How is this diagnosed? Mild neurocognitive disorder is diagnosed through an assessment by your health care provider. Your health care provider will ask you and your family, friends, or coworkers questions about your symptoms. He or she will ask how often the symptoms occur, how long they have been occurring, whether they are getting worse, and the effect they are having on your life. Your health care provider may refer you to a neurologist or mental health specialist for a detailed evaluation of your mental functions (neuropsychological testing). To identify the cause of your mild neurocognitive disorder, your health care provider may:  Obtain a detailed medical history.  Ask about alcohol and drug use, including prescription medicine.  Perform a physical exam.  Order blood tests and brain imaging exams.  How is this treated? Mild neurocognitive disorder caused by infections, use of certain medicines or "recreational" drugs, and certain medical conditions may improve with treatment of the condition that is causing the disorder. Mild neurocognitive disorder resulting from other causes generally does not improve and may worsen. In these cases, the goal of treatment is to slow progression of the disorder and help you cope with the loss of mental function. Treatments in these cases include:  Medicine. Medicine helps mainly with memory loss and behavioral symptoms.  Talk therapy.   Talk therapy provides education,  emotional support, memory aids, and other ways of making up for decreases in mental function.  Lifestyle changes. These include regular exercise, a healthy diet (including essential omega-3 fatty acids), intellectual stimulation, and increased social interaction.  This information is not intended to replace advice given to you by your health care provider. Make sure you discuss any questions you have with your health care provider. Document Released: 09/07/2012 Document Revised: 06/13/2015 Document Reviewed: 05/30/2012 [employer] Interactive Patient Education  2017 [employer]  
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
## 2017-08-04 DIAGNOSIS — H5340 Unspecified visual field defects: Secondary | ICD-10-CM | POA: Diagnosis not present

## 2017-08-04 DIAGNOSIS — H401192 Primary open-angle glaucoma, unspecified eye, moderate stage: Secondary | ICD-10-CM | POA: Diagnosis not present

## 2017-08-04 DIAGNOSIS — H52223 Regular astigmatism, bilateral: Secondary | ICD-10-CM | POA: Diagnosis not present

## 2017-08-04 DIAGNOSIS — H26491 Other secondary cataract, right eye: Secondary | ICD-10-CM | POA: Diagnosis not present

## 2017-08-04 DIAGNOSIS — Z961 Presence of intraocular lens: Secondary | ICD-10-CM | POA: Diagnosis not present

## 2017-08-04 DIAGNOSIS — H5213 Myopia, bilateral: Secondary | ICD-10-CM | POA: Diagnosis not present

## 2017-08-04 DIAGNOSIS — H401112 Primary open-angle glaucoma, right eye, moderate stage: Secondary | ICD-10-CM | POA: Diagnosis not present

## 2017-08-04 DIAGNOSIS — H401122 Primary open-angle glaucoma, left eye, moderate stage: Secondary | ICD-10-CM | POA: Diagnosis not present

## 2017-08-19 ENCOUNTER — Other Ambulatory Visit: Payer: Self-pay

## 2017-08-19 ENCOUNTER — Telehealth: Payer: Self-pay | Admitting: Internal Medicine

## 2017-08-19 NOTE — Telephone Encounter (Signed)
Based on this brief description it sounds like benign anorectal pathology. Recommend Anusol suppositories or the generic equivalent as well as fiber supplementation with Metamucil daily. She should have an office follow-up as well. Of course, if the bleeding becomes pronounced and/or spontaneously and she should go to the emergency room for evaluation as this may be something other than benign anorectal pathology.

## 2017-08-19 NOTE — Telephone Encounter (Signed)
Patient states she is having a lot of blood in her stool and is really worried. Patient would like advice. Patient states she will be out from 10:30-12:30

## 2017-08-19 NOTE — Telephone Encounter (Signed)
Patient reports blood with her bowel movements. She noticed this for the first time yesterday evening. Today there has been bright red blood with each movement. She has had several formed bowel movements "which is normal for me." She denies any rectal pain and she denies dizziness.

## 2017-08-19 NOTE — Telephone Encounter (Signed)
Patient instructed. She said she was sorry her description was brief and didn't know what else she could add to it. She thanks me for the call and will call us for an appointment if she acutely worsens or fails to improve.

## 2017-08-24 ENCOUNTER — Ambulatory Visit: Payer: Medicare Other | Admitting: Adult Health

## 2017-10-04 ENCOUNTER — Telehealth: Payer: Self-pay | Admitting: Cardiovascular Disease

## 2017-10-04 NOTE — Telephone Encounter (Signed)
The BP readings are mildly elevated but would not be the cause of her symptoms She should consult with her primary MD

## 2017-10-04 NOTE — Telephone Encounter (Signed)
Spoke to patient who recently is feeling faint and has blurry vision.  She has no CP or SOB.  Her BP yesterday was 146/86, forgot to record HR.  Her BP today was 156/101 with HR of 82.  She feels better today, although a little dizzy.  Please advise, thank you.

## 2017-10-04 NOTE — Telephone Encounter (Signed)
°  Pt c/o BP issue:  1. What are your last 5 BP readings? Yesterday 146/ 86  Today 156/101 2. Are you having any other symptoms (ex. Dizziness, headache, blurred vision, passed out)? Dizziness, blurred vision 3. What is your medication issue? Very weak in the mornings and dizziness.

## 2017-10-04 NOTE — Telephone Encounter (Signed)
Called patient to review Dr. Elmarie Shiley advice. I reviewed her medications with her and she states she does not think she has been taking amlodipine. She is going to call the pharmacy to determine what the pill looks like. I advised that if she has not been taking it, to resume amlodipine 5 mg daily. She states she has been taking her BP when she first wakes up in the morning. I advised her to monitor BP 1-2 times daily approximately 1-2 hours after taking medication and not within 30 minutes of a meal. I advised her to call back if BP remains elevated. She verbalized understanding and agreement and thanked me for the call.

## 2017-10-05 ENCOUNTER — Telehealth: Payer: Self-pay | Admitting: Neurology

## 2017-10-05 NOTE — Telephone Encounter (Signed)
Pts daughter [place](not on [place] [phone]) called stating that the pt is needing an office visit while someone is in town and can come with her. Stating her sister will be in on October 7th would like to discuss if anyway possible the pt can be seen or to discuss other days

## 2017-10-06 ENCOUNTER — Ambulatory Visit: Payer: Medicare Other | Admitting: Cardiovascular Disease

## 2017-10-06 NOTE — Telephone Encounter (Signed)
Returned call to [place]. Located recent [place] is on it. LVM for return call with office number.

## 2017-10-11 DIAGNOSIS — Z6829 Body mass index (BMI) 29.0-29.9, adult: Secondary | ICD-10-CM | POA: Diagnosis not present

## 2017-10-11 DIAGNOSIS — G4700 Insomnia, unspecified: Secondary | ICD-10-CM | POA: Diagnosis not present

## 2017-10-11 DIAGNOSIS — Z23 Encounter for immunization: Secondary | ICD-10-CM | POA: Diagnosis not present

## 2017-10-11 DIAGNOSIS — R42 Dizziness and giddiness: Secondary | ICD-10-CM | POA: Diagnosis not present

## 2017-10-11 DIAGNOSIS — I10 Essential (primary) hypertension: Secondary | ICD-10-CM | POA: Diagnosis not present

## 2017-10-11 DIAGNOSIS — Z5181 Encounter for therapeutic drug level monitoring: Secondary | ICD-10-CM | POA: Diagnosis not present

## 2017-10-11 NOTE — Telephone Encounter (Signed)
Spoke with [place]. She stated that she and her sisters are concerned of pt not remember what is discussed at her appointments. They are going to start making sure that one daughter is with her at all visits.  She stated pt's memory loss seems to have escalated. She stated that pt saw Dr. Joya Salm today. RN advised that if pt has had an acute change she should be checked for something like a UTI. Her sister will be discussing things with her later today post-appt. She is aware that pt was last seen by Dr. Jaynee Eagles in July and per Dr. Jaynee Eagles pt can f/u if symptoms worsen or fail to improve, can f/u in a year or as needed.  RN offered we could schedule a January appointment for 6 months out unless pt needs to be seen sooner. Cyndy Freeze stated that she would discuss with her sister and then call back to schedule pt's appointment.

## 2017-10-12 DIAGNOSIS — Z23 Encounter for immunization: Secondary | ICD-10-CM | POA: Diagnosis not present

## 2017-10-12 DIAGNOSIS — R42 Dizziness and giddiness: Secondary | ICD-10-CM | POA: Diagnosis not present

## 2017-10-12 DIAGNOSIS — Z6829 Body mass index (BMI) 29.0-29.9, adult: Secondary | ICD-10-CM | POA: Diagnosis not present

## 2017-10-12 DIAGNOSIS — I10 Essential (primary) hypertension: Secondary | ICD-10-CM | POA: Diagnosis not present

## 2017-10-12 DIAGNOSIS — G4700 Insomnia, unspecified: Secondary | ICD-10-CM | POA: Diagnosis not present

## 2017-10-12 DIAGNOSIS — Z5181 Encounter for therapeutic drug level monitoring: Secondary | ICD-10-CM | POA: Diagnosis not present

## 2017-10-27 DIAGNOSIS — L820 Inflamed seborrheic keratosis: Secondary | ICD-10-CM | POA: Diagnosis not present

## 2017-11-15 DIAGNOSIS — R82998 Other abnormal findings in urine: Secondary | ICD-10-CM | POA: Diagnosis not present

## 2017-11-15 DIAGNOSIS — I10 Essential (primary) hypertension: Secondary | ICD-10-CM | POA: Diagnosis not present

## 2017-11-15 DIAGNOSIS — R7301 Impaired fasting glucose: Secondary | ICD-10-CM | POA: Diagnosis not present

## 2017-11-15 DIAGNOSIS — E7849 Other hyperlipidemia: Secondary | ICD-10-CM | POA: Diagnosis not present

## 2017-11-22 DIAGNOSIS — Z1389 Encounter for screening for other disorder: Secondary | ICD-10-CM | POA: Diagnosis not present

## 2017-11-22 DIAGNOSIS — Z6830 Body mass index (BMI) 30.0-30.9, adult: Secondary | ICD-10-CM | POA: Diagnosis not present

## 2017-11-22 DIAGNOSIS — R4182 Altered mental status, unspecified: Secondary | ICD-10-CM | POA: Diagnosis not present

## 2017-11-22 DIAGNOSIS — Z0000 Encounter for general adult medical examination without abnormal findings: Secondary | ICD-10-CM | POA: Diagnosis not present

## 2017-11-22 DIAGNOSIS — E7849 Other hyperlipidemia: Secondary | ICD-10-CM | POA: Diagnosis not present

## 2017-11-22 DIAGNOSIS — I10 Essential (primary) hypertension: Secondary | ICD-10-CM | POA: Diagnosis not present

## 2017-11-22 DIAGNOSIS — G300 Alzheimer's disease with early onset: Secondary | ICD-10-CM | POA: Diagnosis not present

## 2017-11-22 DIAGNOSIS — G4700 Insomnia, unspecified: Secondary | ICD-10-CM | POA: Diagnosis not present

## 2017-11-22 DIAGNOSIS — M1990 Unspecified osteoarthritis, unspecified site: Secondary | ICD-10-CM | POA: Diagnosis not present

## 2017-11-22 DIAGNOSIS — N183 Chronic kidney disease, stage 3 (moderate): Secondary | ICD-10-CM | POA: Diagnosis not present

## 2017-11-22 DIAGNOSIS — R7309 Other abnormal glucose: Secondary | ICD-10-CM | POA: Diagnosis not present

## 2017-11-22 DIAGNOSIS — I4891 Unspecified atrial fibrillation: Secondary | ICD-10-CM | POA: Diagnosis not present

## 2017-12-03 DIAGNOSIS — Z1212 Encounter for screening for malignant neoplasm of rectum: Secondary | ICD-10-CM | POA: Diagnosis not present

## 2018-01-20 ENCOUNTER — Emergency Department (HOSPITAL_COMMUNITY)
Admission: EM | Admit: 2018-01-20 | Discharge: 2018-01-20 | Discharge status: Against Medical Advice | Payer: Medicare Other | Attending: Emergency Medicine | Admitting: Emergency Medicine

## 2018-01-20 ENCOUNTER — Encounter (HOSPITAL_COMMUNITY): Payer: Self-pay

## 2018-01-20 DIAGNOSIS — Z5321 Procedure and treatment not carried out due to patient leaving prior to being seen by health care provider: Secondary | ICD-10-CM | POA: Insufficient documentation

## 2018-01-20 DIAGNOSIS — S0101XA Laceration without foreign body of scalp, initial encounter: Secondary | ICD-10-CM | POA: Diagnosis present

## 2018-01-20 DIAGNOSIS — Y939 Activity, unspecified: Secondary | ICD-10-CM | POA: Diagnosis not present

## 2018-01-20 DIAGNOSIS — W1830XA Fall on same level, unspecified, initial encounter: Secondary | ICD-10-CM | POA: Diagnosis not present

## 2018-01-20 DIAGNOSIS — Y999 Unspecified external cause status: Secondary | ICD-10-CM | POA: Insufficient documentation

## 2018-01-20 DIAGNOSIS — Y929 Unspecified place or not applicable: Secondary | ICD-10-CM | POA: Diagnosis not present

## 2018-01-20 NOTE — ED Triage Notes (Signed)
Pt reports tripping and falling tonight, hit her head, laceration noted to the top of her head. Denies LOC but did have 2 glasses of wine and 2 sleeping pills prior to fall. Pt alert and oriented. Denies any blood thinners

## 2018-01-20 NOTE — ED Notes (Signed)
Wet Gauze aaplied to PT head

## 2018-01-20 NOTE — ED Notes (Signed)
Pt walked out

## 2018-01-21 ENCOUNTER — Emergency Department (HOSPITAL_COMMUNITY): Payer: Medicare Other

## 2018-01-21 ENCOUNTER — Encounter (HOSPITAL_COMMUNITY): Payer: Self-pay

## 2018-01-21 ENCOUNTER — Emergency Department (HOSPITAL_COMMUNITY)
Admission: EM | Admit: 2018-01-21 | Discharge: 2018-01-21 | Disposition: A | Discharge status: Home / Self Care | Payer: Medicare Other | Attending: Emergency Medicine | Admitting: Emergency Medicine

## 2018-01-21 DIAGNOSIS — W010XXA Fall on same level from slipping, tripping and stumbling without subsequent striking against object, initial encounter: Secondary | ICD-10-CM | POA: Insufficient documentation

## 2018-01-21 DIAGNOSIS — Y999 Unspecified external cause status: Secondary | ICD-10-CM | POA: Insufficient documentation

## 2018-01-21 DIAGNOSIS — S0101XA Laceration without foreign body of scalp, initial encounter: Secondary | ICD-10-CM | POA: Insufficient documentation

## 2018-01-21 DIAGNOSIS — Y9389 Activity, other specified: Secondary | ICD-10-CM | POA: Insufficient documentation

## 2018-01-21 DIAGNOSIS — I10 Essential (primary) hypertension: Secondary | ICD-10-CM | POA: Diagnosis not present

## 2018-01-21 DIAGNOSIS — Z87891 Personal history of nicotine dependence: Secondary | ICD-10-CM | POA: Insufficient documentation

## 2018-01-21 DIAGNOSIS — Z79899 Other long term (current) drug therapy: Secondary | ICD-10-CM | POA: Diagnosis not present

## 2018-01-21 DIAGNOSIS — S0990XA Unspecified injury of head, initial encounter: Secondary | ICD-10-CM | POA: Diagnosis not present

## 2018-01-21 DIAGNOSIS — Y9289 Other specified places as the place of occurrence of the external cause: Secondary | ICD-10-CM | POA: Diagnosis not present

## 2018-01-21 MED ORDER — LIDOCAINE-EPINEPHRINE (PF) 2 %-1:200000 IJ SOLN
10.0000 mL | Freq: Once | INTRAMUSCULAR | Status: AC
  Administered 2018-01-21: 10 mL
  Filled 2018-01-21: qty 10

## 2018-01-21 MED ORDER — LIDOCAINE-EPINEPHRINE (PF) 2 %-1:200000 IJ SOLN: 10.0000 mL | Freq: Once | INTRAMUSCULAR | Status: DC

## 2018-01-21 NOTE — Discharge Instructions (Addendum)
You have been seen today for scalp laceration after a fall. Please read and follow all provided instructions.   1. Medications: usual home medications 2. Treatment: rest, drink plenty of fluids, keep area clean and dry for 24-48 hours.  3. Follow Up: Please follow up with your primary doctor in 7 days for discussion of your diagnoses and further evaluation after today's visit; if you do not have a primary care doctor use the resource guide provided to find one; Please return to the ER for any new or worsening symptoms. Please obtain all of your results from medical records or have your doctors office obtain the results - share them with your doctor - you should be seen at your doctors office. Call today to arrange your follow up.   Take medications as prescribed. Please review all of the medicines and only take them if you do not have an allergy to them. Return to the emergency room for worsening condition or new concerning symptoms. Follow up with your regular doctor. If you don't have a regular doctor use one of the numbers below to establish a primary care doctor.  Please be aware that if you are taking birth control pills, taking other prescriptions, ESPECIALLY ANTIBIOTICS may make the birth control ineffective - if this is the case, either do not engage in sexual activity or use alternative methods of birth control such as condoms until you have finished the medicine and your family doctor says it is OK to restart them. If you are on a blood thinner such as COUMADIN, be aware that any other medicine that you take may cause the coumadin to either work too much, or not enough - you should have your coumadin level rechecked in next 7 days if this is the case.  ?  It is also a possibility that you have an allergic reaction to any of the medicines that you have been prescribed - Everybody reacts differently to medications and while MOST people have no trouble with most medicines, you may have a reaction  such as nausea, vomiting, rash, swelling, shortness of breath. If this is the case, please stop taking the medicine immediately and contact your physician.  ?  You should return to the ER if you develop severe or worsening symptoms.   Emergency Department Resource Guide 1) Find a Doctor and Pay Out of Pocket Although you won't have to find out who is covered by your insurance plan, it is a good idea to ask around and get recommendations. You will then need to call the office and see if the doctor you have chosen will accept you as a new patient and what types of options they offer for patients who are self-pay. Some doctors offer discounts or will set up payment plans for their patients who do not have insurance, but you will need to ask so you aren't surprised when you get to your appointment.  2) Contact Your Local Health Department Not all health departments have doctors that can see patients for sick visits, but many do, so it is worth a call to see if yours does. If you don't know where your local health department is, you can check in your phone book. The CDC also has a tool to help you locate your state's health department, and many state websites also have listings of all of their local health departments.  3) Find a [facility] If your illness is not likely to be very severe or complicated, you may want  to try a walk in clinic. These are popping up all over the country in pharmacies, drugstores, and shopping centers. They're usually staffed by nurse practitioners or physician assistants that have been trained to treat common illnesses and complaints. They're usually fairly quick and inexpensive. However, if you have serious medical issues or chronic medical problems, these are probably not your best option.  No Primary Care Doctor: Call Health Connect at  [phone] - they can help you locate a primary care doctor that  accepts your insurance, provides certain services, etc. Physician  Referral [phone]  Emergency Department Resource Guide 1) Find a Doctor and Pay Out of Pocket Although you won't have to find out who is covered by your insurance plan, it is a good idea to ask around and get recommendations. You will then need to call the office and see if the doctor you have chosen will accept you as a new patient and what types of options they offer for patients who are self-pay. Some doctors offer discounts or will set up payment plans for their patients who do not have insurance, but you will need to ask so you aren't surprised when you get to your appointment.  2) Contact Your Local Health Department Not all health departments have doctors that can see patients for sick visits, but many do, so it is worth a call to see if yours does. If you don't know where your local health department is, you can check in your phone book. The CDC also has a tool to help you locate your state's health department, and many state websites also have listings of all of their local health departments.  3) Find a [facility] If your illness is not likely to be very severe or complicated, you may want to try a walk in clinic. These are popping up all over the country in pharmacies, drugstores, and shopping centers. They're usually staffed by nurse practitioners or physician assistants that have been trained to treat common illnesses and complaints. They're usually fairly quick and inexpensive. However, if you have serious medical issues or chronic medical problems, these are probably not your best option.  No Primary Care Doctor: Call Health Connect at  [phone] - they can help you locate a primary care doctor that  accepts your insurance, provides certain services, etc. Physician Referral Service- [phone]  Chronic Pain Problems: Organization         Address  Phone   Notes  [facility]  [phone] Patients need to be referred by their primary care  doctor.   Medication Assistance: Organization         Address  Phone   Notes  [facility] Medication [facility] [address] [place], [place] [address] [phone] --Must be a resident of [facility] -- Must have NO insurance coverage whatsoever (no Medicaid/ Medicare, etc.) -- The pt. MUST have a primary care doctor that directs their care regularly and follows them in the community   [facility]  [phone]   [employer]  [phone]    Agencies that provide inexpensive medical care: Organization         Address  Phone   Notes  [facility]  [phone]   [facility]    [phone]   [facility] [place], [place] [address] [phone]   [place] [address] [place] [phone]  Planned Parenthood    [phone]   [facility]    [phone]   [facility] and [address], [place] Phone:  [phone], Fax:  [phone] Hours of Operation:  9 am - 6 pm, M-F.  Also accepts Medicaid/Medicare and self-pay.  [facility] for [address], [address], [place] Phone: [phone], Fax: [phone]. Hours of Operation:  8:30 am - 5:30 pm, M-F.  Also accepts Medicaid and self-pay.  [facility] [place] [address], [place] Phone: [phone]   [place], [place], [place] [phone], Ext. [phone] Mondays & Thursdays: 7-9 AM.  First 15 patients are seen on a first come, first serve basis.    [place] Providers:  Organization         Address  Phone   Notes  [facility] [address], [place] [phone] Also accepts self-pay patients.  [facility] [address], [place]  [phone]   [place], [address], [place] [phone]   [facility] [address], [place] [phone]   Lucianne Lei [address], [place]   [phone] Only accepts [place] [facility] [place] patients after they have their name applied to their card.   Self-Pay (no insurance) in [facility]:  Organization         Address  Phone   Notes  Sickle Cell Patients, [facility] [place] [phone]   [facility] [place] [phone]   [facility] [place]  [place], [place],  [phone]   [facility]/Dr. Osei-Bonsu  [address] [place] or [address], [place] [phone] Phone number for both [place] and [place] locations is the same.  [facility] [address] [place] [phone]   [facility] [address] [place] or [address] [phone]   [facility] [address], [place] [phone], phone; [phone], fax Sees patients 1st and 3rd Saturday of every month.  Must not qualify for public or private insurance (i.e. Medicaid, Medicare, [place] Health Choice, Veterans' Benefits)  Household income should be no more than 200% of the poverty level The clinic cannot treat you if you are pregnant or think you are pregnant  Sexually transmitted diseases are not treated at the clinic.

## 2018-01-21 NOTE — ED Provider Notes (Signed)
  Face-to-face evaluation   History: She is here for evaluation of head injury.  She tripped putting on her pants fall caused her to injure her head.  No loss of consciousness.  Normal behavior since.  Physical exam: Fully female, alert cooperative.  Irregular heartbeat.  She is not currently taking anticoagulants.  He is lucid.  Gaping right mid frontal laceration.  Mild bleeding without associated crepitation or deformity.  Medical screening examination/treatment/procedure(s) were conducted as a shared visit with non-physician practitioner(s) and myself.  I personally evaluated the patient during the encounter    Daleen Bo, MD 01/25/18 1320

## 2018-01-21 NOTE — ED Notes (Signed)
Waiting on lidocaine to come from pharmacy.

## 2018-01-21 NOTE — ED Provider Notes (Addendum)
[facility] DEPT Provider Note   CSN: [id number] Arrival date & time: 01/21/18  1044     History   Chief Complaint Chief Complaint  Patient presents with  . Fall    HPI [name] is a 85 y.o. female with a PMH of atrial fibrillation, HTN, HLD, and multiple falls presenting after a fall last night at 7pm. Patient reports she was in her closet trying to take her pants off, when she lost her balance and fell forward. Patient reports she also had 2 glasses of wine last night. Patient states she has about 2 falls each year for the last few years. Patient denies LOC and states she hit the front of her scalp on the corner of the wall. Patient denies taking blood thinners. Patient reports she attempted to be evaluated at [facility] yesterday, but left and came to [facility] today due to the waiting time. Patient states she took tylenol for the pain last night, but denies taking any medications today or any current pain. Patient denies new vision changes, abdominal pain, nausea, vomiting, or headache. Patient denies chest pain, shortness of breath, or dysuria. Patient denies any acute complaints. Daughter is a contributing historian. Daughter reports mother is at baseline and denies any changes in mental status.   HPI  Past Medical History:  Diagnosis Date  . Arthritis   . Atrial fibrillation ([place])   . Cataract    BILATERAL  . DDD (degenerative disc disease), cervical   . Depression   . Diverticulosis   . Falls   . GERD (gastroesophageal reflux disease)   . Glaucoma   . Headache   . Hyperlipidemia   . Hyperlipidemia   . Hypertension   . Osteoporosis   . Peripheral neuropathy   . Spondylosis    lumbar  . Subdural hematoma [facility])     Patient Active Problem List   Diagnosis Date Noted  . Mild cognitive impairment 04/02/2016  . Falls 05/18/2015  . Atrial fibrillation ([place]) 10/30/2014  . Whiplash 10/16/2014  . Memory changes 10/16/2014  .  Hemorrhage of rectum and anus 10/13/2013    Past Surgical History:  Procedure Laterality Date  . APPENDECTOMY    . BACK SURGERY  2010  . BURR HOLE FOR SUBDURAL HEMATOMA     x 2 hematoma  . COLONOSCOPY    . KNEE SURGERY Bilateral   . VAGINAL HYSTERECTOMY       OB History   No obstetric history on file.      Home Medications    Prior to Admission medications   Medication Sig Start Date End Date Taking? Authorizing Provider  acetaminophen (TYLENOL) 650 MG CR tablet Take 650 mg by mouth every 8 (eight) hours as needed for pain.   Yes [provider]  amLODipine (NORVASC) 5 MG tablet Take 5 mg by mouth daily. 04/23/17  Yes [provider]  donepezil (ARICEPT) 10 MG tablet Take 10 mg by mouth at bedtime.   Yes [provider]  Biotin 5 MG CAPS Take 1 capsule by mouth daily.    [provider]  Cholecalciferol (VITAMIN D3) 2000 units TABS Take 1 tablet by mouth daily.    [provider]  Coenzyme Q10 (CO Q 10 PO) Take 1 capsule by mouth daily.     [provider]  DULoxetine (CYMBALTA) 60 MG capsule Take 60 mg by mouth daily.    [provider]  latanoprost (XALATAN) 0.005 % ophthalmic solution Place  1 drop into both eyes at bedtime.    [provider]  loratadine (CLARITIN) 10 MG tablet Take 10 mg by mouth daily.    [provider]  Melatonin 10 MG TABS Take 10-15 mg by mouth at bedtime.    [provider]  meloxicam (MOBIC) 15 MG tablet Take 15 mg by mouth daily.    [provider]  metoprolol tartrate (LOPRESSOR) 100 MG tablet Take 100 mg by mouth 2 (two) times daily.    [provider]  Multiple Vitamins-Minerals (OCUVITE PO) Take by mouth.    [provider]  simethicone (MYLICON) 607 MG chewable tablet Chew 125 mg by mouth as directed.    [provider]  simvastatin (ZOCOR) 20 MG tablet Take 20 mg by mouth daily.    [provider]  timolol  (TIMOPTIC) 0.5 % ophthalmic solution Place 1 drop into both eyes daily.    [provider]  traZODone (DESYREL) 100 MG tablet Take 100 mg by mouth at bedtime.    [provider]  vitamin B-12 (CYANOCOBALAMIN) 1000 MCG tablet Take 3,000 mcg by mouth daily.    [provider]    Family History Family History  Problem Relation Age of Onset  . Heart disease Father   . Ovarian cancer Mother        ?  Marland Kitchen Breast cancer Sister   . Colon cancer Neg Hx   . Diabetes Neg Hx   . Liver disease Neg Hx   . Kidney disease Neg Hx   . Dementia Neg Hx     Social History Social History   Tobacco Use  . Smoking status: Former Research scientist (life sciences)  . Smokeless tobacco: Never Used  . Tobacco comment: [place] [facility] [place] [facility]  Substance Use Topics  . Alcohol use: Yes    Alcohol/week: 14.0 standard drinks    Types: 14 Glasses of wine per week    Comment: 2 glasses of wine per nite  . Drug use: No     Allergies   Codeine   Review of Systems Review of Systems  Constitutional: Negative for activity change, appetite change, chills, diaphoresis, fatigue, fever and unexpected weight change.  Eyes: Negative for visual disturbance.  Respiratory: Negative for cough and shortness of breath.   Cardiovascular: Negative for chest pain, palpitations and leg swelling.  Gastrointestinal: Negative for abdominal pain, nausea and vomiting.  Genitourinary: Negative for dysuria and frequency.  Skin: Positive for wound. Negative for pallor.  Allergic/Immunologic: Negative for immunocompromised state.  Neurological: Negative for dizziness, seizures, syncope, speech difficulty, weakness, numbness and headaches.  Hematological: Does not bruise/bleed easily.  Psychiatric/Behavioral: Negative for sleep disturbance. The patient is not nervous/anxious.      Physical Exam Updated Vital Signs BP (!) 117/103 (BP Location: Left Arm)   Pulse 77   Temp 97.8 F (36.6 C) (Oral)   Resp  20   SpO2 97%   Physical Exam Vitals signs and nursing note reviewed.  Constitutional:      General: She is not in acute distress.    Appearance: She is well-developed. She is not diaphoretic.  HENT:     Head: Normocephalic. Laceration (3.5cm laceration noted over right aspect of scalp. Mild bleeding noted. ) present. No raccoon eyes or Battle's sign.     Jaw: No trismus or swelling.     Right Ear: Tympanic membrane, ear canal and external ear normal.     Left Ear: Tympanic membrane, ear canal  and external ear normal.     Nose: Nose normal. No congestion or rhinorrhea.     Mouth/Throat:     Mouth: Mucous membranes are moist.  Eyes:     General: No scleral icterus.       Right eye: No discharge.        Left eye: No discharge.     Extraocular Movements: Extraocular movements intact.     Conjunctiva/sclera: Conjunctivae normal.     Pupils: Pupils are equal, round, and reactive to light.  Neck:     Musculoskeletal: Normal range of motion and neck supple.  Cardiovascular:     Rate and Rhythm: Normal rate and regular rhythm.     Heart sounds: Normal heart sounds. No murmur. No friction rub. No gallop.   Pulmonary:     Effort: Pulmonary effort is normal. No respiratory distress.     Breath sounds: Normal breath sounds. No wheezing or rales.  Abdominal:     Palpations: Abdomen is soft.     Tenderness: There is no abdominal tenderness.  Musculoskeletal: Normal range of motion.  Skin:    Findings: No erythema or rash.  Neurological:     Mental Status: She is alert and oriented to person, place, and time. Mental status is at baseline.    Mental Status:  Alert, oriented, thought content appropriate, able to give a coherent history. Speech fluent without evidence of aphasia. Able to follow 2 step commands without difficulty.  Cranial Nerves:  II:  Peripheral visual fields grossly normal, pupils equal, round, reactive to light III,IV, VI: ptosis not present, extra-ocular motions intact  bilaterally  V,VII: smile symmetric, facial light touch sensation equal VIII: hearing grossly normal to voice  X: uvula elevates symmetrically  XI: bilateral shoulder shrug symmetric and strong XII: midline tongue extension without fassiculations Motor:  Normal tone. 5/5 in upper and lower extremities bilaterally including strong and equal grip strength and dorsiflexion/plantar flexion Sensory: light touch normal in all extremities.  Deep Tendon Reflexes: 2+ and symmetric in the biceps and patella Cerebellar: normal finger-to-nose with bilateral upper extremities Gait: normal gait and balance.  Negative pronator drift. Negative Romberg sign. CV: distal pulses palpable throughout    ED Treatments / Results  Labs (all labs ordered are listed, but only abnormal results are displayed) Labs Reviewed - No data to display  EKG None  Radiology Ct Head Wo Contrast  Result Date: 01/21/2018 CLINICAL DATA:  Fall, frontal head injury, laceration EXAM: CT HEAD WITHOUT CONTRAST TECHNIQUE: Contiguous axial images were obtained from the base of the skull through the vertex without intravenous contrast. COMPARISON:  06/09/2017 FINDINGS: Brain: Extensive atrophy and chronic white matter microvascular ischemic changes as before. No acute intracranial hemorrhage, new mass lesion, definite infarction, midline shift, herniation, hydrocephalus, or extra-axial fluid collection. Cisterns are patent. Cerebellar atrophy as well. Vascular: Intracranial atherosclerosis.  No hyperdense vessel. Skull: Remote right frontal burr holes noted. Sinuses/Orbits: No acute finding. Other: Anterior midline scalp injury noted over the frontal sinuses. No large hematoma. IMPRESSION: Anterior frontal scalp injury/laceration. No acute intracranial abnormality by noncontrast CT. Stable atrophy and white matter microvascular ischemic changes. Electronically Signed   By: Jerilynn Mages.  Shick M.D.   On: 01/21/2018 11:47    Procedures .Marland KitchenLaceration  Repair Date/Time: 01/21/2018 3:08 PM Performed by: Arville Lime, PA-C Authorized by: Arville Lime, PA-C   Consent:    Consent obtained:  Verbal   Consent given by:  Patient   Risks discussed:  Infection, need for additional  repair, pain, poor cosmetic result and poor wound healing   Alternatives discussed:  No treatment and delayed treatment Universal protocol:    Procedure explained and questions answered to patient or proxy's satisfaction: yes     Relevant documents present and verified: yes     Test results available and properly labeled: yes     Imaging studies available: yes     Required blood products, implants, devices, and special equipment available: yes     Site/side marked: yes     Immediately prior to procedure, a time out was called: yes     Patient identity confirmed:  Verbally with patient Anesthesia (see MAR for exact dosages):    Anesthesia method:  Local infiltration   Local anesthetic:  Lidocaine 2% WITH epi Laceration details:    Location:  Scalp   Length (cm):  3.5   Depth (mm):  7 Repair type:    Repair type:  Simple Exploration:    Hemostasis achieved with:  Epinephrine and direct pressure   Wound exploration: wound explored through full range of motion     Wound extent: no foreign bodies/material noted     Contaminated: no   Treatment:    Area cleansed with:  Betadine   Amount of cleaning:  Standard   Irrigation solution:  Sterile saline   Irrigation method:  Syringe   Visualized foreign bodies/material removed: no   Skin repair:    Repair method:  Staples   Number of staples:  4 Approximation:    Approximation:  Close Post-procedure details:    Dressing:  Bulky dressing   Patient tolerance of procedure:  Tolerated well, no immediate complications   (including critical care time)  Medications Ordered in ED Medications  lidocaine-EPINEPHrine (XYLOCAINE W/EPI) 2 %-1:200000 (PF) injection 10 mL (10 mLs Infiltration Given 01/21/18 1403)      Initial Impression / Assessment and Plan / ED Course  I have reviewed the triage vital signs and the nursing notes.  Pertinent labs & imaging results that were available during my care of the patient were reviewed by me and considered in my medical decision making (see chart for details).  Clinical Course as of Jan 23 1640  Fri Jan 21, 2018  1303 No acute intracranial abnormality by noncontrast CT. Stable atrophy and white matter microvascular ischemic changes.    CT Head Wo Contrast [AH]    Clinical Course User Index [AH] Arville Lime, PA-C   Patient was evaluated for scalp laceration after a fall. Neurological exam is normal and patient denies any acute complaints. Patient is asymptomatic and ambulates without difficulty. CT head is negative for acute intracranial abnormalities. Scalp laceration was repaired with staples. Instructed patient to follow up with PCP in 7 days for wound check and staple removal. Patient is stable and will be discharged to home. Discussed plan with patient and daughter. They state they understand and agree with the plan.  Findings and plan of care discussed with supervising physician Dr. Eulis Foster who personally evaluated and examined this patient.   Final Clinical Impressions(s) / ED Diagnoses   Final diagnoses:  Laceration of scalp, initial encounter    ED Discharge Orders    None       Arville Lime, PA-C 01/21/18 1511    Arville Lime, [place] 01/22/18 1642    Daleen Bo, MD 01/25/18 1320

## 2018-01-21 NOTE — ED Triage Notes (Addendum)
Patient fell last night around 7:00 pm in her closet.  Patient has a laceration to frontal lobe of head about 1.5 inches long.  Denies blood thinners.   A/Ox4.  Ambulatory in triage.  Denies pain at this time.  Daughter with patient.

## 2018-01-28 DIAGNOSIS — Z4802 Encounter for removal of sutures: Secondary | ICD-10-CM | POA: Diagnosis not present

## 2018-01-28 DIAGNOSIS — S0191XA Laceration without foreign body of unspecified part of head, initial encounter: Secondary | ICD-10-CM | POA: Diagnosis not present

## 2018-01-28 DIAGNOSIS — Z6829 Body mass index (BMI) 29.0-29.9, adult: Secondary | ICD-10-CM | POA: Diagnosis not present

## 2018-02-15 ENCOUNTER — Ambulatory Visit (INDEPENDENT_AMBULATORY_CARE_PROVIDER_SITE_OTHER)
Admission: RE | Admit: 2018-02-15 | Discharge: 2018-02-15 | Disposition: A | Discharge status: Home / Self Care | Payer: Medicare Other | Source: Ambulatory Visit | Attending: Physician Assistant | Admitting: Physician Assistant

## 2018-02-15 ENCOUNTER — Ambulatory Visit (INDEPENDENT_AMBULATORY_CARE_PROVIDER_SITE_OTHER): Payer: Medicare Other | Admitting: Physician Assistant

## 2018-02-15 ENCOUNTER — Encounter: Payer: Self-pay | Admitting: Physician Assistant

## 2018-02-15 VITALS — BP 122/78 | HR 72 | Ht 68.5 in | Wt 188.3 lb

## 2018-02-15 DIAGNOSIS — R197 Diarrhea, unspecified: Secondary | ICD-10-CM | POA: Diagnosis not present

## 2018-02-15 DIAGNOSIS — R194 Change in bowel habit: Secondary | ICD-10-CM

## 2018-02-15 DIAGNOSIS — R1084 Generalized abdominal pain: Secondary | ICD-10-CM | POA: Diagnosis not present

## 2018-02-15 NOTE — Patient Instructions (Addendum)
If you are age 83 or older, your body mass index should be between 23-30. Your Body mass index is 28.22 kg/m. If this is out of the aforementioned range listed, please consider follow up with your Primary Care Provider.  If you are age 6 or younger, your body mass index should be between 19-25. Your Body mass index is 28.22 kg/m. If this is out of the aformentioned range listed, please consider follow up with your Primary Care Provider.   Your provider has requested that you go to the basement level for an X-ray before leaving today. Press "B" on the elevator. The lab is located at the first door on the left as you exit the elevator.  Follow up with me on March 08, 2018 at 2:15 pm.  Thank you for choosing me and [facility].    Ellouise Newer, PA-C

## 2018-02-15 NOTE — Progress Notes (Signed)
Chief Complaint: Irregular bowel movements  HPI:    [name] is an 85 year old Caucasian female, known to Dr. Henrene Pastor, with a past medical history as listed below, who presents to clinic today with a complaint of irregular bowel movements.    06/29/2017 office visit with Dr. Henrene Pastor.  At that time described multiple complaints including abdominal bloating, discomfort, belching and a change in stool color.  Prior colonoscopy noted in 2014.  It was recommended due to her age and comorbidities no further endoscopic evaluation was recommended.  She had a CBC and CMP, abdominal ultrasound ordered.  Continue Omeprazole 40 mg daily.  Recommended she take a probiotic once daily for a month.  Labs returned normal. US unremarkable.    Today, patient presents to clinic accompanied by her daughter who does assist with history.  She explains that for the past year or more she has had trouble with a generalized abdominal discomfort, bloating, gas and abnormal bowel movements.  Currently the patient describes this has gotten worse over the past 6 months or so telling me that she cannot go anywhere in the morning before around 11 AM because she will have about 8-20 stools before then.  She tells me these are very small "most of the time around the size of a tick" and are never fully formed, typically loose/liquid.  She does take a nightly laxative either Dulcolax or Ex-Lax, she has been doing this forever and cannot remember when she was last off of them.  Also recently started Metamucil per recommendations from a doctor relative.  Associated symptoms include bloating and gas.    Also describes heartburn on an almost daily basis.  Currently not on medicine for this.    Denies fever, chills, blood in her stool, weight loss, anorexia, nausea or vomiting.  Past Medical History:  Diagnosis Date  . Arthritis   . Atrial fibrillation ([place])   . Cataract    BILATERAL  . DDD (degenerative disc disease), cervical   .  Depression   . Diverticulosis   . Falls   . GERD (gastroesophageal reflux disease)   . Glaucoma   . Headache   . Hyperlipidemia   . Hyperlipidemia   . Hypertension   . Osteoporosis   . Peripheral neuropathy   . Spondylosis    lumbar  . Subdural hematoma [facility])     Past Surgical History:  Procedure Laterality Date  . APPENDECTOMY    . BACK SURGERY  2010  . BURR HOLE FOR SUBDURAL HEMATOMA     x 2 hematoma  . COLONOSCOPY    . KNEE SURGERY Bilateral   . VAGINAL HYSTERECTOMY      Current Outpatient Medications  Medication Sig Dispense Refill  . acetaminophen (TYLENOL) 650 MG CR tablet Take 650 mg by mouth every 8 (eight) hours as needed for pain.    Marland Kitchen amLODipine (NORVASC) 5 MG tablet Take 5 mg by mouth daily.    . Biotin 5 MG CAPS Take 1 capsule by mouth daily.    . Cholecalciferol (VITAMIN D3) 2000 units TABS Take 1 tablet by mouth daily.    . Coenzyme Q10 (CO Q 10 PO) Take 1 capsule by mouth daily.     Marland Kitchen donepezil (ARICEPT) 10 MG tablet Take 10 mg by mouth at bedtime.    . DULoxetine (CYMBALTA) 60 MG capsule Take 60 mg by mouth daily.    Marland Kitchen latanoprost (XALATAN) 0.005 % ophthalmic solution Place 1 drop into both eyes at bedtime.    Marland Kitchen  loratadine (CLARITIN) 10 MG tablet Take 10 mg by mouth daily.    . Melatonin 10 MG TABS Take 10-15 mg by mouth at bedtime.    . meloxicam (MOBIC) 15 MG tablet Take 15 mg by mouth daily.    . metoprolol tartrate (LOPRESSOR) 100 MG tablet Take 100 mg by mouth 2 (two) times daily.    . Multiple Vitamins-Minerals (OCUVITE PO) Take by mouth.    . simethicone (MYLICON) 778 MG chewable tablet Chew 125 mg by mouth as directed.    . simvastatin (ZOCOR) 20 MG tablet Take 20 mg by mouth daily.    . timolol (TIMOPTIC) 0.5 % ophthalmic solution Place 1 drop into both eyes daily.    . traZODone (DESYREL) 100 MG tablet Take 100 mg by mouth at bedtime.    . vitamin B-12 (CYANOCOBALAMIN) 1000 MCG tablet Take 3,000 mcg by mouth daily.     No current  facility-administered medications for this visit.     Allergies as of 02/15/2018 - Review Complete 02/15/2018  Allergen Reaction Noted  . Codeine Other (See Comments) 07/06/2011    Family History  Problem Relation Age of Onset  . Heart disease Father   . Ovarian cancer Mother        ?  Marland Kitchen Breast cancer Sister   . Colon cancer Neg Hx   . Diabetes Neg Hx   . Liver disease Neg Hx   . Kidney disease Neg Hx   . Dementia Neg Hx     Social History   Socioeconomic History  . Marital status: Married    Spouse name: Jenny Reichmann   . Number of children: 2  . Years of education: 16+  . Highest education level: Not on file  Occupational History  . Not on file  Social Needs  . Financial resource strain: Not on file  . Food insecurity:    Worry: Not on file    Inability: Not on file  . Transportation needs:    Medical: Not on file    Non-medical: Not on file  Tobacco Use  . Smoking status: Former Research scientist (life sciences)  . Smokeless tobacco: Never Used  . Tobacco comment: smoked [place] flight from [place] once  Substance and Sexual Activity  . Alcohol use: Yes    Alcohol/week: 14.0 standard drinks    Types: 14 Glasses of wine per week    Comment: 2 glasses of wine per nite  . Drug use: No  . Sexual activity: Not on file  Lifestyle  . Physical activity:    Days per week: Not on file    Minutes per session: Not on file  . Stress: Not on file  Relationships  . Social connections:    Talks on phone: Not on file    Gets together: Not on file    Attends religious service: Not on file    Active member of club or organization: Not on file    Attends meetings of clubs or organizations: Not on file    Relationship status: Not on file  . Intimate partner violence:    Fear of current or ex partner: Not on file    Emotionally abused: Not on file    Physically abused: Not on file    Forced sexual activity: Not on file  Other Topics Concern  . Not on file  Social History Narrative   Lives at  retirement community with husband, Madie Reno.   Caffeine use: 1 cups coffee in the morning  Right-handed    Review of Systems:    Constitutional: No weight loss, fever or chills Skin: No rash  Cardiovascular: No chest pain Respiratory: No SOB  Gastrointestinal: See HPI and otherwise negative   Physical Exam:  Vital signs: BP 122/78   Pulse 72   Ht 5' 8.5" (1.74 m)   Wt 188 lb 5 oz (85.4 kg)   BMI 28.22 kg/m   Constitutional:   Pleasant Caucasian female appears to be in NAD, Well developed, Well nourished, alert and cooperative Respiratory: Respirations even and unlabored. Lungs clear to auscultation bilaterally.   No wheezes, crackles, or rhonchi.  Cardiovascular: Normal S1, S2. No MRG. Regular rate and rhythm. No peripheral edema, cyanosis or pallor.  Gastrointestinal:  Soft, nondistended, mild generalized ttp. No rebound or guarding. Normal bowel sounds. No appreciable masses or hepatomegaly. Psychiatric: Demonstrates good judgement and reason without abnormal affect or behaviors.  No recent labs or imaging.  Assessment: 1.  Change in bowel habits: Multiple small bowel movements in the morning, on a nightly laxative for years, uncertain what her baseline is; consider constipation versus diarrhea +/- IBS 2.  Generalized abdominal discomfort: With above; consider IBS versus other  Plan: 1.  Ordered a KUB xray 2 view today.  Discussed with patient that this will help [place] decipher if she is truly constipated versus having multiple loose stools in the morning.  This will help [place] figure out what to do with her next.  If she is truly constipated then we may need to stop the laxatives she is on and start something such as Amitiza or Linzess.  If she does not have any stool in her colon then would recommend she discontinue all of her laxatives and we can start some Dicyclomine 10 mg 3 times daily. 2.  Discussed that some of the supplements patient is on could be adding to her issues including  vitamin D3 and CO Q10. 3.  Patient to follow in clinic with me in 3 to 4 weeks or sooner if necessary.  Ellouise Newer, PA-C [facility] 02/15/2018, 2:45 PM  Cc: Burnard Bunting, MD

## 2018-02-15 NOTE — Progress Notes (Signed)
Assessment and plan reviewed 

## 2018-02-16 ENCOUNTER — Telehealth: Payer: Self-pay | Admitting: Physician Assistant

## 2018-02-16 NOTE — Telephone Encounter (Signed)
Pt daughter called in for the pt lab results

## 2018-02-16 NOTE — Telephone Encounter (Signed)
See results on imaging from yesterday for additional details.

## 2018-03-08 ENCOUNTER — Ambulatory Visit (INDEPENDENT_AMBULATORY_CARE_PROVIDER_SITE_OTHER): Payer: Medicare Other | Admitting: Physician Assistant

## 2018-03-08 ENCOUNTER — Encounter: Payer: Self-pay | Admitting: Physician Assistant

## 2018-03-08 VITALS — BP 126/80 | HR 60 | Ht 68.5 in | Wt 189.0 lb

## 2018-03-08 DIAGNOSIS — R194 Change in bowel habit: Secondary | ICD-10-CM | POA: Diagnosis not present

## 2018-03-08 DIAGNOSIS — R1084 Generalized abdominal pain: Secondary | ICD-10-CM | POA: Diagnosis not present

## 2018-03-08 MED ORDER — PEPPERMINT OIL 90 MG PO CPCR: 1.0000 | ORAL_CAPSULE | Freq: Two times a day (BID) | ORAL | 3 refills | Status: DC

## 2018-03-08 NOTE — Progress Notes (Signed)
Assessment and plan reviewed.  I do not recommend a follow-up colonoscopy.  Thanks

## 2018-03-08 NOTE — Progress Notes (Signed)
Chief Complaint: Follow-up irregular bowel habits  HPI:    [name] is an 85 year old Caucasian female, known to Dr. Henrene Pastor, with a past medical history as listed below, who returns to clinic today for follow-up of her regular bowel movements.    02/16/2013 colonoscopy Dr. Henrene Pastor was 6 polyps in the ascending colon and cecum, moderate diverticulosis and otherwise normal.  Pathology showed fragments of tubular adenoma.  At that time due to patient's age was recommended she not have a routine follow-up.    02/15/2018 office visit with me explaining that for the past year or so she is troubled with a generalized abdominal discomfort, bloating, gas no normal bowel movements.  This is worsened over the past 6 months with 8-20 stools before 11 AM in the morning.  Describes small stools.  At that time ordered KUB to decipher if she is dealing with overflow constipation versus diarrhea.    02/15/2018 KUB showed no backup of stool in her colon.  It was discussed that she should discontinue laxatives for right now continue her fiber with at least 6-8 8 ounce glasses of water a day.  Also recommended she start IBgard 2 tabs twice daily for a month and then decrease to 1 tab twice daily.  This was an attempt to see how her system did without being on laxatives.    Today, patient again presents to clinic accompanied by her daughter and tells me that overall she feels much better.  She does continue with "applesauce stool with occasional form" at least 8 or 9 episodes in the morning, but feels better in general.  She is using IBgard 2 tabs twice a day as well as a fiber supplement once daily.    Denies fever, chills, weight loss, blood in her stool or further abdominal pain.     Past Medical History:  Diagnosis Date  . Arthritis   . Atrial fibrillation ([place])   . Cataract    BILATERAL  . DDD (degenerative disc disease), cervical   . Depression   . Diverticulosis   . Falls   . GERD (gastroesophageal reflux  disease)   . Glaucoma   . Headache   . Hyperlipidemia   . Hyperlipidemia   . Hypertension   . Osteoporosis   . Peripheral neuropathy   . Spondylosis    lumbar  . Subdural hematoma [facility])     Past Surgical History:  Procedure Laterality Date  . APPENDECTOMY    . BACK SURGERY  2010  . BURR HOLE FOR SUBDURAL HEMATOMA     x 2 hematoma  . COLONOSCOPY    . KNEE SURGERY Bilateral   . VAGINAL HYSTERECTOMY      Current Outpatient Medications  Medication Sig Dispense Refill  . acetaminophen (TYLENOL) 650 MG CR tablet Take 650 mg by mouth every 8 (eight) hours as needed for pain.    Marland Kitchen amLODipine (NORVASC) 5 MG tablet Take 5 mg by mouth daily.    . Biotin 5 MG CAPS Take 1 capsule by mouth daily.    . Cholecalciferol (VITAMIN D3) 2000 units TABS Take 1 tablet by mouth daily.    . Coenzyme Q10 (CO Q 10 PO) Take 1 capsule by mouth daily.     Marland Kitchen donepezil (ARICEPT) 10 MG tablet Take 10 mg by mouth at bedtime.    . DULoxetine (CYMBALTA) 60 MG capsule Take 60 mg by mouth daily.    . Glycerin, Laxative, (ADULT SUPPOSITORY RE) Place rectally at bedtime as needed.    Marland Kitchen  latanoprost (XALATAN) 0.005 % ophthalmic solution Place 1 drop into both eyes at bedtime.    Marland Kitchen loratadine (CLARITIN) 10 MG tablet Take 10 mg by mouth daily.    . Melatonin 10 MG TABS Take 10-15 mg by mouth at bedtime.    . meloxicam (MOBIC) 15 MG tablet Take 15 mg by mouth daily.    . metoprolol tartrate (LOPRESSOR) 100 MG tablet Take 100 mg by mouth 2 (two) times daily.    . Multiple Vitamins-Minerals (OCUVITE PO) Take by mouth.    . psyllium (METAMUCIL) 58.6 % powder Take 1 packet by mouth daily.    . simethicone (MYLICON) 177 MG chewable tablet Chew 125 mg by mouth as directed.    . simvastatin (ZOCOR) 20 MG tablet Take 20 mg by mouth daily.    . timolol (TIMOPTIC) 0.5 % ophthalmic solution Place 1 drop into both eyes daily.    . traZODone (DESYREL) 100 MG tablet Take 100 mg by mouth at bedtime.    . vitamin B-12  (CYANOCOBALAMIN) 1000 MCG tablet Take 3,000 mcg by mouth daily.     No current facility-administered medications for this visit.     Allergies as of 03/08/2018 - Review Complete 02/15/2018  Allergen Reaction Noted  . Codeine Other (See Comments) 07/06/2011    Family History  Problem Relation Age of Onset  . Heart disease Father   . Ovarian cancer Mother        ?  Marland Kitchen Breast cancer Sister   . Colon cancer Neg Hx   . Diabetes Neg Hx   . Liver disease Neg Hx   . Kidney disease Neg Hx   . Dementia Neg Hx     Social History   Socioeconomic History  . Marital status: Married    Spouse name: Jenny Reichmann   . Number of children: 2  . Years of education: 16+  . Highest education level: Not on file  Occupational History  . Not on file  Social Needs  . Financial resource strain: Not on file  . Food insecurity:    Worry: Not on file    Inability: Not on file  . Transportation needs:    Medical: Not on file    Non-medical: Not on file  Tobacco Use  . Smoking status: Former Research scientist (life sciences)  . Smokeless tobacco: Never Used  . Tobacco comment: [place]  Substance and Sexual Activity  . Alcohol use: Yes    Alcohol/week: 14.0 standard drinks    Types: 14 Glasses of wine per week    Comment: 2 glasses of wine per nite  . Drug use: No  . Sexual activity: Not on file  Lifestyle  . Physical activity:    Days per week: Not on file    Minutes per session: Not on file  . Stress: Not on file  Relationships  . Social connections:    Talks on phone: Not on file    Gets together: Not on file    Attends religious service: Not on file    Active member of club or organization: Not on file    Attends meetings of clubs or organizations: Not on file    Relationship status: Not on file  . Intimate partner violence:    Fear of current or ex partner: Not on file    Emotionally abused: Not on file    Physically abused: Not on file    Forced sexual activity: Not on file  Other Topics Concern  . Not on file  Social History Narrative   Lives at retirement community with husband, [name].   Caffeine use: 1 cups coffee in the morning   Right-handed    Review of Systems:    Constitutional: No weight loss, fever or chills Cardiovascular: No chest pain Respiratory: No SOB  Gastrointestinal: See HPI and otherwise negative   Physical Exam:  Vital signs: Ht 5' 8.5" (1.74 m)   Wt 189 lb (85.7 kg)   BMI 28.32 kg/m   Constitutional:   Pleasant Caucasian female appears to be in NAD, Well developed, Well nourished, alert and cooperative Respiratory: Respirations even and unlabored. Lungs clear to auscultation bilaterally.   No wheezes, crackles, or rhonchi.  Cardiovascular: Normal S1, S2. No MRG. Regular rate and rhythm. No peripheral edema, cyanosis or pallor.  Gastrointestinal:  Soft, nondistended, nontender. No rebound or guarding. Normal bowel sounds. No appreciable masses or hepatomegaly. Rectal:  Not performed.  Psychiatric: Demonstrates good judgement and reason without abnormal affect or behaviors.  No recent labs. See HPI for recent KUB  Assessment: 1.  Change in bowel habits: Patient still has multiple stools in the morning, but some of these now have formed and she no longer has abdominal discomfort since stopping laxatives and starting IBgard as well as a fiber supplement 2.  Generalized abdominal discomfort  Plan: 1.  Continue IBgard, can decrease to 1 tab twice a day for a month and then 1 tab once daily 2.  Discussed with patient she could try stopping her vitamin D and co-Q10 as she is not sure why she is on these anyways 3.  Continue fiber supplement 4.  Continue water 6-8 eight ounce glasses of water per day 5.  Patient feels well at this time.  Discussed the option of having a colonoscopy but she would like to wait.  Told her to call us in a month if she is still concerned and at that time would recommend a colonoscopy with Dr. Henrene Pastor. 6.   Patient to follow in clinic as needed with [place] in the future.  Ellouise Newer, PA-C [facility] 03/08/2018, 2:20 PM  Cc: Burnard Bunting, MD

## 2018-03-08 NOTE — Patient Instructions (Signed)
If you are age 83 or older, your body mass index should be between 23-30. Your Body mass index is 28.32 kg/m. If this is out of the aforementioned range listed, please consider follow up with your Primary Care Provider.  If you are age 107 or younger, your body mass index should be between 19-25. Your Body mass index is 28.32 kg/m. If this is out of the aformentioned range listed, please consider follow up with your Primary Care Provider.   Continue Fiber.  You have been given samples of IBgard - take one twice daily for one month and then one daily.  Can try stopping Vitamin D and CoQ10.  Follow up as needed.  Thank you for choosing me and [facility].    Ellouise Newer, PA-C

## 2018-03-28 DIAGNOSIS — J101 Influenza due to other identified influenza virus with other respiratory manifestations: Secondary | ICD-10-CM | POA: Diagnosis not present

## 2018-03-28 DIAGNOSIS — R05 Cough: Secondary | ICD-10-CM | POA: Diagnosis not present

## 2018-05-11 ENCOUNTER — Telehealth: Payer: Self-pay | Admitting: Cardiovascular Disease

## 2018-05-11 NOTE — Telephone Encounter (Signed)
Follow up   Pts daughter is calling and would like to talk about virtual visit  Please call [place] at [phone]

## 2018-05-11 NOTE — Telephone Encounter (Signed)
Spoke with patient who confirmed all demographics. Patient does not know how to use her computer and does not have My Chart.  She does not have a smart phone. Will have vitals ready for visit.

## 2018-05-12 NOTE — Telephone Encounter (Signed)
Spoke with patient's daughter who would like to reschedule patient's appointment for August so that she can come with her. Verified appointment date and time with daughter and advised her to call back with questions or concerns prior to that time. She thanked me for the call.

## 2018-05-17 ENCOUNTER — Telehealth: Payer: Medicare Other | Admitting: Cardiovascular Disease

## 2018-05-26 DIAGNOSIS — L814 Other melanin hyperpigmentation: Secondary | ICD-10-CM | POA: Diagnosis not present

## 2018-05-26 DIAGNOSIS — L821 Other seborrheic keratosis: Secondary | ICD-10-CM | POA: Diagnosis not present

## 2018-05-26 DIAGNOSIS — D225 Melanocytic nevi of trunk: Secondary | ICD-10-CM | POA: Diagnosis not present

## 2018-05-26 DIAGNOSIS — L820 Inflamed seborrheic keratosis: Secondary | ICD-10-CM | POA: Diagnosis not present

## 2018-05-26 DIAGNOSIS — D1801 Hemangioma of skin and subcutaneous tissue: Secondary | ICD-10-CM | POA: Diagnosis not present

## 2018-06-02 DIAGNOSIS — G309 Alzheimer's disease, unspecified: Secondary | ICD-10-CM | POA: Diagnosis not present

## 2018-06-02 DIAGNOSIS — I480 Paroxysmal atrial fibrillation: Secondary | ICD-10-CM | POA: Diagnosis not present

## 2018-06-02 DIAGNOSIS — F339 Major depressive disorder, recurrent, unspecified: Secondary | ICD-10-CM | POA: Diagnosis not present

## 2018-06-02 DIAGNOSIS — I10 Essential (primary) hypertension: Secondary | ICD-10-CM | POA: Diagnosis not present

## 2018-06-02 DIAGNOSIS — E782 Mixed hyperlipidemia: Secondary | ICD-10-CM | POA: Diagnosis not present

## 2018-06-02 DIAGNOSIS — K219 Gastro-esophageal reflux disease without esophagitis: Secondary | ICD-10-CM | POA: Diagnosis not present

## 2018-07-08 DIAGNOSIS — E782 Mixed hyperlipidemia: Secondary | ICD-10-CM | POA: Diagnosis not present

## 2018-07-08 DIAGNOSIS — Z79899 Other long term (current) drug therapy: Secondary | ICD-10-CM | POA: Diagnosis not present

## 2018-07-25 DIAGNOSIS — E782 Mixed hyperlipidemia: Secondary | ICD-10-CM | POA: Diagnosis not present

## 2018-07-25 DIAGNOSIS — I480 Paroxysmal atrial fibrillation: Secondary | ICD-10-CM | POA: Diagnosis not present

## 2018-07-25 DIAGNOSIS — F339 Major depressive disorder, recurrent, unspecified: Secondary | ICD-10-CM | POA: Diagnosis not present

## 2018-07-25 DIAGNOSIS — I10 Essential (primary) hypertension: Secondary | ICD-10-CM | POA: Diagnosis not present

## 2018-07-25 DIAGNOSIS — E871 Hypo-osmolality and hyponatremia: Secondary | ICD-10-CM | POA: Diagnosis not present

## 2018-07-25 DIAGNOSIS — F5101 Primary insomnia: Secondary | ICD-10-CM | POA: Diagnosis not present

## 2018-08-18 DIAGNOSIS — H52223 Regular astigmatism, bilateral: Secondary | ICD-10-CM | POA: Diagnosis not present

## 2018-08-18 DIAGNOSIS — H401132 Primary open-angle glaucoma, bilateral, moderate stage: Secondary | ICD-10-CM | POA: Diagnosis not present

## 2018-08-18 DIAGNOSIS — H5213 Myopia, bilateral: Secondary | ICD-10-CM | POA: Diagnosis not present

## 2018-08-18 DIAGNOSIS — Z961 Presence of intraocular lens: Secondary | ICD-10-CM | POA: Diagnosis not present

## 2018-08-18 DIAGNOSIS — H524 Presbyopia: Secondary | ICD-10-CM | POA: Diagnosis not present

## 2018-08-18 DIAGNOSIS — H26493 Other secondary cataract, bilateral: Secondary | ICD-10-CM | POA: Diagnosis not present

## 2018-08-18 DIAGNOSIS — H26491 Other secondary cataract, right eye: Secondary | ICD-10-CM | POA: Diagnosis not present

## 2018-08-30 DIAGNOSIS — H401131 Primary open-angle glaucoma, bilateral, mild stage: Secondary | ICD-10-CM | POA: Diagnosis not present

## 2018-08-30 DIAGNOSIS — H18413 Arcus senilis, bilateral: Secondary | ICD-10-CM | POA: Diagnosis not present

## 2018-08-30 DIAGNOSIS — Z961 Presence of intraocular lens: Secondary | ICD-10-CM | POA: Diagnosis not present

## 2018-08-30 DIAGNOSIS — H26491 Other secondary cataract, right eye: Secondary | ICD-10-CM | POA: Diagnosis not present

## 2018-09-07 DIAGNOSIS — Z961 Presence of intraocular lens: Secondary | ICD-10-CM | POA: Diagnosis not present

## 2018-09-07 DIAGNOSIS — H52221 Regular astigmatism, right eye: Secondary | ICD-10-CM | POA: Diagnosis not present

## 2018-09-07 DIAGNOSIS — H5211 Myopia, right eye: Secondary | ICD-10-CM | POA: Diagnosis not present

## 2018-09-08 ENCOUNTER — Telehealth: Payer: Self-pay | Admitting: Cardiovascular Disease

## 2018-09-08 NOTE — Telephone Encounter (Signed)
Left detailed message on daughter's voicemail that she may accompany her mom to the appointment due to concerns with cognitive issues. I advised her to reschedule or send another family member if she has any symptoms of Covid-19. I also asked that if she has any legal paperwork for HCPOA that she bring [place] a copy to scan into patient's chart.  I advised that she may call our office with additional questions or concerns.

## 2018-09-08 NOTE — Telephone Encounter (Signed)
  Daughter wants to come with her mom to her appt with Dr Acie Fredrickson on 09/13/18. She states that her mom does not remember things and may need assistance talking with the doctor
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
## 2018-09-13 ENCOUNTER — Ambulatory Visit: Payer: Medicare Other | Admitting: Cardiovascular Disease

## 2018-09-13 ENCOUNTER — Encounter: Payer: Self-pay | Admitting: Cardiovascular Disease

## 2018-09-13 ENCOUNTER — Encounter (INDEPENDENT_AMBULATORY_CARE_PROVIDER_SITE_OTHER): Payer: Self-pay

## 2018-09-13 ENCOUNTER — Other Ambulatory Visit: Payer: Self-pay

## 2018-09-13 ENCOUNTER — Ambulatory Visit (INDEPENDENT_AMBULATORY_CARE_PROVIDER_SITE_OTHER): Payer: Medicare Other | Admitting: Cardiovascular Disease

## 2018-09-13 VITALS — BP 138/78 | HR 63 | Ht 68.5 in | Wt 188.2 lb

## 2018-09-13 DIAGNOSIS — I4811 Longstanding persistent atrial fibrillation: Secondary | ICD-10-CM | POA: Diagnosis not present

## 2018-09-13 NOTE — Patient Instructions (Signed)
Medication Instructions:  Your physician recommends that you continue on your current medications as directed. Please refer to the Current Medication list given to you today.  If you need a refill on your cardiac medications before your next appointment, please call your pharmacy.   Lab work: None Ordered    Testing/Procedures: None Ordered   Follow-Up: At [facility], you and your health needs are our priority.  As part of our continuing mission to provide you with exceptional heart care, we have created designated Provider Care Teams.  These Care Teams include your primary Cardiologist (physician) and Advanced Practice Providers (APPs -  Physician Assistants and Nurse Practitioners) who all work together to provide you with the care you need, when you need it. You will need a follow up appointment in:  1 years.  Please call our office 2 months in advance to schedule this appointment.  You may see Dr. Nahser or one of the following Advanced Practice Providers on your designated Care Team: Scott Weaver, PA-C Vin Bhagat, PA-C . Janine Hammond, NP    

## 2018-09-19 DIAGNOSIS — F339 Major depressive disorder, recurrent, unspecified: Secondary | ICD-10-CM | POA: Diagnosis not present

## 2018-09-19 DIAGNOSIS — F5101 Primary insomnia: Secondary | ICD-10-CM | POA: Diagnosis not present

## 2018-09-19 DIAGNOSIS — I480 Paroxysmal atrial fibrillation: Secondary | ICD-10-CM | POA: Diagnosis not present

## 2018-09-19 DIAGNOSIS — E782 Mixed hyperlipidemia: Secondary | ICD-10-CM | POA: Diagnosis not present

## 2018-09-19 DIAGNOSIS — I10 Essential (primary) hypertension: Secondary | ICD-10-CM | POA: Diagnosis not present

## 2018-10-31 DIAGNOSIS — M159 Polyosteoarthritis, unspecified: Secondary | ICD-10-CM | POA: Diagnosis not present

## 2018-10-31 DIAGNOSIS — F5101 Primary insomnia: Secondary | ICD-10-CM | POA: Diagnosis not present

## 2018-10-31 DIAGNOSIS — N3281 Overactive bladder: Secondary | ICD-10-CM | POA: Diagnosis not present

## 2018-10-31 DIAGNOSIS — R9431 Abnormal electrocardiogram [ECG] [EKG]: Secondary | ICD-10-CM | POA: Diagnosis not present

## 2018-10-31 DIAGNOSIS — I480 Paroxysmal atrial fibrillation: Secondary | ICD-10-CM | POA: Diagnosis not present

## 2018-10-31 DIAGNOSIS — I10 Essential (primary) hypertension: Secondary | ICD-10-CM | POA: Diagnosis not present

## 2018-10-31 DIAGNOSIS — F339 Major depressive disorder, recurrent, unspecified: Secondary | ICD-10-CM | POA: Diagnosis not present

## 2018-10-31 DIAGNOSIS — E782 Mixed hyperlipidemia: Secondary | ICD-10-CM | POA: Diagnosis not present

## 2018-10-31 DIAGNOSIS — R42 Dizziness and giddiness: Secondary | ICD-10-CM | POA: Diagnosis not present

## 2018-11-04 DIAGNOSIS — Z23 Encounter for immunization: Secondary | ICD-10-CM | POA: Diagnosis not present

## 2018-11-09 DIAGNOSIS — F419 Anxiety disorder, unspecified: Secondary | ICD-10-CM | POA: Diagnosis not present

## 2018-11-09 DIAGNOSIS — F339 Major depressive disorder, recurrent, unspecified: Secondary | ICD-10-CM | POA: Diagnosis not present

## 2018-11-09 DIAGNOSIS — I129 Hypertensive chronic kidney disease with stage 1 through stage 4 chronic kidney disease, or unspecified chronic kidney disease: Secondary | ICD-10-CM | POA: Diagnosis not present

## 2018-11-09 DIAGNOSIS — F0280 Dementia in other diseases classified elsewhere without behavioral disturbance: Secondary | ICD-10-CM | POA: Diagnosis not present

## 2018-11-09 DIAGNOSIS — N189 Chronic kidney disease, unspecified: Secondary | ICD-10-CM | POA: Diagnosis not present

## 2018-11-09 DIAGNOSIS — G309 Alzheimer's disease, unspecified: Secondary | ICD-10-CM | POA: Diagnosis not present

## 2018-11-09 DIAGNOSIS — M150 Primary generalized (osteo)arthritis: Secondary | ICD-10-CM | POA: Diagnosis not present

## 2018-11-09 DIAGNOSIS — G8929 Other chronic pain: Secondary | ICD-10-CM | POA: Diagnosis not present

## 2018-11-09 DIAGNOSIS — K219 Gastro-esophageal reflux disease without esophagitis: Secondary | ICD-10-CM | POA: Diagnosis not present

## 2018-11-09 DIAGNOSIS — Z9181 History of falling: Secondary | ICD-10-CM | POA: Diagnosis not present

## 2018-11-09 DIAGNOSIS — G629 Polyneuropathy, unspecified: Secondary | ICD-10-CM | POA: Diagnosis not present

## 2018-11-09 DIAGNOSIS — E782 Mixed hyperlipidemia: Secondary | ICD-10-CM | POA: Diagnosis not present

## 2018-11-09 DIAGNOSIS — I480 Paroxysmal atrial fibrillation: Secondary | ICD-10-CM | POA: Diagnosis not present

## 2018-11-09 DIAGNOSIS — F5101 Primary insomnia: Secondary | ICD-10-CM | POA: Diagnosis not present

## 2018-11-09 DIAGNOSIS — M47896 Other spondylosis, lumbar region: Secondary | ICD-10-CM | POA: Diagnosis not present

## 2018-11-09 DIAGNOSIS — N3281 Overactive bladder: Secondary | ICD-10-CM | POA: Diagnosis not present

## 2018-11-09 DIAGNOSIS — M545 Low back pain: Secondary | ICD-10-CM | POA: Diagnosis not present

## 2018-11-10 DIAGNOSIS — M150 Primary generalized (osteo)arthritis: Secondary | ICD-10-CM | POA: Diagnosis not present

## 2018-11-10 DIAGNOSIS — G309 Alzheimer's disease, unspecified: Secondary | ICD-10-CM | POA: Diagnosis not present

## 2018-11-10 DIAGNOSIS — F0280 Dementia in other diseases classified elsewhere without behavioral disturbance: Secondary | ICD-10-CM | POA: Diagnosis not present

## 2018-11-18 DIAGNOSIS — H903 Sensorineural hearing loss, bilateral: Secondary | ICD-10-CM | POA: Diagnosis not present

## 2018-11-23 DIAGNOSIS — L820 Inflamed seborrheic keratosis: Secondary | ICD-10-CM | POA: Diagnosis not present

## 2018-11-28 DIAGNOSIS — N3281 Overactive bladder: Secondary | ICD-10-CM | POA: Diagnosis not present

## 2018-11-28 DIAGNOSIS — R42 Dizziness and giddiness: Secondary | ICD-10-CM | POA: Diagnosis not present

## 2018-11-28 DIAGNOSIS — F5101 Primary insomnia: Secondary | ICD-10-CM | POA: Diagnosis not present

## 2018-11-28 DIAGNOSIS — I10 Essential (primary) hypertension: Secondary | ICD-10-CM | POA: Diagnosis not present

## 2018-12-09 DIAGNOSIS — G629 Polyneuropathy, unspecified: Secondary | ICD-10-CM | POA: Diagnosis not present

## 2018-12-09 DIAGNOSIS — M150 Primary generalized (osteo)arthritis: Secondary | ICD-10-CM | POA: Diagnosis not present

## 2018-12-09 DIAGNOSIS — F339 Major depressive disorder, recurrent, unspecified: Secondary | ICD-10-CM | POA: Diagnosis not present

## 2018-12-09 DIAGNOSIS — I480 Paroxysmal atrial fibrillation: Secondary | ICD-10-CM | POA: Diagnosis not present

## 2018-12-09 DIAGNOSIS — G309 Alzheimer's disease, unspecified: Secondary | ICD-10-CM | POA: Diagnosis not present

## 2018-12-09 DIAGNOSIS — Z9181 History of falling: Secondary | ICD-10-CM | POA: Diagnosis not present

## 2018-12-09 DIAGNOSIS — G8929 Other chronic pain: Secondary | ICD-10-CM | POA: Diagnosis not present

## 2018-12-09 DIAGNOSIS — N189 Chronic kidney disease, unspecified: Secondary | ICD-10-CM | POA: Diagnosis not present

## 2018-12-09 DIAGNOSIS — I129 Hypertensive chronic kidney disease with stage 1 through stage 4 chronic kidney disease, or unspecified chronic kidney disease: Secondary | ICD-10-CM | POA: Diagnosis not present

## 2018-12-09 DIAGNOSIS — M47896 Other spondylosis, lumbar region: Secondary | ICD-10-CM | POA: Diagnosis not present

## 2018-12-09 DIAGNOSIS — E782 Mixed hyperlipidemia: Secondary | ICD-10-CM | POA: Diagnosis not present

## 2018-12-09 DIAGNOSIS — F0280 Dementia in other diseases classified elsewhere without behavioral disturbance: Secondary | ICD-10-CM | POA: Diagnosis not present

## 2018-12-09 DIAGNOSIS — F5101 Primary insomnia: Secondary | ICD-10-CM | POA: Diagnosis not present

## 2018-12-09 DIAGNOSIS — F419 Anxiety disorder, unspecified: Secondary | ICD-10-CM | POA: Diagnosis not present

## 2018-12-09 DIAGNOSIS — M545 Low back pain: Secondary | ICD-10-CM | POA: Diagnosis not present

## 2018-12-09 DIAGNOSIS — N3281 Overactive bladder: Secondary | ICD-10-CM | POA: Diagnosis not present

## 2018-12-09 DIAGNOSIS — K219 Gastro-esophageal reflux disease without esophagitis: Secondary | ICD-10-CM | POA: Diagnosis not present

## 2018-12-23 DIAGNOSIS — N189 Chronic kidney disease, unspecified: Secondary | ICD-10-CM | POA: Diagnosis not present

## 2018-12-23 DIAGNOSIS — I129 Hypertensive chronic kidney disease with stage 1 through stage 4 chronic kidney disease, or unspecified chronic kidney disease: Secondary | ICD-10-CM | POA: Diagnosis not present

## 2018-12-23 DIAGNOSIS — M150 Primary generalized (osteo)arthritis: Secondary | ICD-10-CM | POA: Diagnosis not present

## 2018-12-23 DIAGNOSIS — F0280 Dementia in other diseases classified elsewhere without behavioral disturbance: Secondary | ICD-10-CM | POA: Diagnosis not present

## 2018-12-23 DIAGNOSIS — I480 Paroxysmal atrial fibrillation: Secondary | ICD-10-CM | POA: Diagnosis not present

## 2018-12-23 DIAGNOSIS — G309 Alzheimer's disease, unspecified: Secondary | ICD-10-CM | POA: Diagnosis not present

## 2018-12-26 DIAGNOSIS — R42 Dizziness and giddiness: Secondary | ICD-10-CM | POA: Diagnosis not present

## 2018-12-26 DIAGNOSIS — F5101 Primary insomnia: Secondary | ICD-10-CM | POA: Diagnosis not present

## 2018-12-26 DIAGNOSIS — I10 Essential (primary) hypertension: Secondary | ICD-10-CM | POA: Diagnosis not present

## 2019-02-09 DIAGNOSIS — I10 Essential (primary) hypertension: Secondary | ICD-10-CM | POA: Diagnosis not present

## 2019-02-09 DIAGNOSIS — Z79899 Other long term (current) drug therapy: Secondary | ICD-10-CM | POA: Diagnosis not present

## 2019-02-09 DIAGNOSIS — Z23 Encounter for immunization: Secondary | ICD-10-CM | POA: Diagnosis not present

## 2019-02-09 DIAGNOSIS — G309 Alzheimer's disease, unspecified: Secondary | ICD-10-CM | POA: Diagnosis not present

## 2019-02-27 DIAGNOSIS — F5101 Primary insomnia: Secondary | ICD-10-CM | POA: Diagnosis not present

## 2019-02-27 DIAGNOSIS — W19XXXA Unspecified fall, initial encounter: Secondary | ICD-10-CM | POA: Diagnosis not present

## 2019-02-27 DIAGNOSIS — S61512A Laceration without foreign body of left wrist, initial encounter: Secondary | ICD-10-CM | POA: Diagnosis not present

## 2019-02-27 DIAGNOSIS — I10 Essential (primary) hypertension: Secondary | ICD-10-CM | POA: Diagnosis not present

## 2019-03-09 DIAGNOSIS — Z23 Encounter for immunization: Secondary | ICD-10-CM | POA: Diagnosis not present

## 2019-03-15 DIAGNOSIS — H401131 Primary open-angle glaucoma, bilateral, mild stage: Secondary | ICD-10-CM | POA: Diagnosis not present

## 2019-03-23 DIAGNOSIS — E782 Mixed hyperlipidemia: Secondary | ICD-10-CM | POA: Diagnosis not present

## 2019-03-23 DIAGNOSIS — G309 Alzheimer's disease, unspecified: Secondary | ICD-10-CM | POA: Diagnosis not present

## 2019-03-31 DIAGNOSIS — G309 Alzheimer's disease, unspecified: Secondary | ICD-10-CM | POA: Diagnosis not present

## 2019-03-31 DIAGNOSIS — I10 Essential (primary) hypertension: Secondary | ICD-10-CM | POA: Diagnosis not present

## 2019-03-31 DIAGNOSIS — Z79899 Other long term (current) drug therapy: Secondary | ICD-10-CM | POA: Diagnosis not present

## 2019-03-31 DIAGNOSIS — E782 Mixed hyperlipidemia: Secondary | ICD-10-CM | POA: Diagnosis not present

## 2019-05-10 DIAGNOSIS — L298 Other pruritus: Secondary | ICD-10-CM | POA: Diagnosis not present

## 2019-05-10 DIAGNOSIS — B078 Other viral warts: Secondary | ICD-10-CM | POA: Diagnosis not present

## 2019-05-10 DIAGNOSIS — D485 Neoplasm of uncertain behavior of skin: Secondary | ICD-10-CM | POA: Diagnosis not present

## 2019-05-11 DIAGNOSIS — F5101 Primary insomnia: Secondary | ICD-10-CM | POA: Diagnosis not present

## 2019-05-11 DIAGNOSIS — Z79899 Other long term (current) drug therapy: Secondary | ICD-10-CM | POA: Diagnosis not present

## 2019-05-11 DIAGNOSIS — G309 Alzheimer's disease, unspecified: Secondary | ICD-10-CM | POA: Diagnosis not present

## 2019-05-11 DIAGNOSIS — I10 Essential (primary) hypertension: Secondary | ICD-10-CM | POA: Diagnosis not present

## 2019-05-11 DIAGNOSIS — K219 Gastro-esophageal reflux disease without esophagitis: Secondary | ICD-10-CM | POA: Diagnosis not present

## 2019-06-01 DIAGNOSIS — Z79899 Other long term (current) drug therapy: Secondary | ICD-10-CM | POA: Diagnosis not present

## 2019-06-01 DIAGNOSIS — G309 Alzheimer's disease, unspecified: Secondary | ICD-10-CM | POA: Diagnosis not present

## 2019-06-01 DIAGNOSIS — I10 Essential (primary) hypertension: Secondary | ICD-10-CM | POA: Diagnosis not present

## 2019-06-01 DIAGNOSIS — F5101 Primary insomnia: Secondary | ICD-10-CM | POA: Diagnosis not present

## 2019-06-01 DIAGNOSIS — K219 Gastro-esophageal reflux disease without esophagitis: Secondary | ICD-10-CM | POA: Diagnosis not present

## 2019-06-05 DIAGNOSIS — M6281 Muscle weakness (generalized): Secondary | ICD-10-CM | POA: Diagnosis not present

## 2019-06-05 DIAGNOSIS — R2681 Unsteadiness on feet: Secondary | ICD-10-CM | POA: Diagnosis not present

## 2019-06-05 DIAGNOSIS — R262 Difficulty in walking, not elsewhere classified: Secondary | ICD-10-CM | POA: Diagnosis not present

## 2019-06-06 DIAGNOSIS — R262 Difficulty in walking, not elsewhere classified: Secondary | ICD-10-CM | POA: Diagnosis not present

## 2019-06-06 DIAGNOSIS — R2681 Unsteadiness on feet: Secondary | ICD-10-CM | POA: Diagnosis not present

## 2019-06-06 DIAGNOSIS — M6281 Muscle weakness (generalized): Secondary | ICD-10-CM | POA: Diagnosis not present

## 2019-06-12 DIAGNOSIS — M6281 Muscle weakness (generalized): Secondary | ICD-10-CM | POA: Diagnosis not present

## 2019-06-12 DIAGNOSIS — R2681 Unsteadiness on feet: Secondary | ICD-10-CM | POA: Diagnosis not present

## 2019-06-12 DIAGNOSIS — R262 Difficulty in walking, not elsewhere classified: Secondary | ICD-10-CM | POA: Diagnosis not present

## 2019-06-14 ENCOUNTER — Telehealth: Payer: Self-pay | Admitting: Cardiovascular Disease

## 2019-06-14 DIAGNOSIS — R2681 Unsteadiness on feet: Secondary | ICD-10-CM | POA: Diagnosis not present

## 2019-06-14 DIAGNOSIS — M6281 Muscle weakness (generalized): Secondary | ICD-10-CM | POA: Diagnosis not present

## 2019-06-14 DIAGNOSIS — R262 Difficulty in walking, not elsewhere classified: Secondary | ICD-10-CM | POA: Diagnosis not present

## 2019-06-14 NOTE — Telephone Encounter (Signed)
New Message    Pt c/o BP issue: STAT if pt c/o blurred vision, one-sided weakness or slurred speech  1. What are your last 5 BP readings?  05/20 171/98  05/22 147/72 05/23 174/94 05/24 170/80 05/25 172/94  05/26 161/85   2. Are you having any other symptoms (ex. Dizziness, headache, blurred vision, passed out)? Some Dizziness and nauseous   3. What is your BP issue? Pts daughter is calling and says she has had elevated BP so she made an appt with Dr Acie Fredrickson on 06/21/19

## 2019-06-14 NOTE — Telephone Encounter (Signed)
New Message   Pts daughter says she will need to bring the pt to her appt because she uses a cane and does not have good balance. She also has memory issues    Please advise

## 2019-06-14 NOTE — Telephone Encounter (Signed)
The patients daughter, Jodi Mourning is calling in to let us know that she will need to attend the her mothers appointment because her mother has difficulty walking and uses a cane.

## 2019-06-15 DIAGNOSIS — Z79899 Other long term (current) drug therapy: Secondary | ICD-10-CM | POA: Diagnosis not present

## 2019-06-15 DIAGNOSIS — G309 Alzheimer's disease, unspecified: Secondary | ICD-10-CM | POA: Diagnosis not present

## 2019-06-15 DIAGNOSIS — F5101 Primary insomnia: Secondary | ICD-10-CM | POA: Diagnosis not present

## 2019-06-15 DIAGNOSIS — K219 Gastro-esophageal reflux disease without esophagitis: Secondary | ICD-10-CM | POA: Diagnosis not present

## 2019-06-15 DIAGNOSIS — I10 Essential (primary) hypertension: Secondary | ICD-10-CM | POA: Diagnosis not present

## 2019-06-21 ENCOUNTER — Encounter: Payer: Self-pay | Admitting: Cardiovascular Disease

## 2019-06-21 ENCOUNTER — Other Ambulatory Visit: Payer: Self-pay

## 2019-06-21 ENCOUNTER — Ambulatory Visit (INDEPENDENT_AMBULATORY_CARE_PROVIDER_SITE_OTHER): Payer: Medicare Other | Admitting: Cardiovascular Disease

## 2019-06-21 VITALS — BP 122/82 | HR 80 | Ht 68.5 in | Wt 174.0 lb

## 2019-06-21 DIAGNOSIS — I4811 Longstanding persistent atrial fibrillation: Secondary | ICD-10-CM

## 2019-06-21 DIAGNOSIS — I10 Essential (primary) hypertension: Secondary | ICD-10-CM | POA: Diagnosis not present
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
## 2019-06-21 NOTE — Patient Instructions (Signed)
Your physician recommends that you continue on your current medications as directed. Please refer to the Current Medication list given to you today.   Your physician wants you to follow-up in: YEAR WITH DR NAHSER  You will receive a reminder letter in the mail two months in advance. If you don't receive a letter, please call our office to schedule the follow-up appointment.  

## 2019-06-29 DIAGNOSIS — E782 Mixed hyperlipidemia: Secondary | ICD-10-CM | POA: Diagnosis not present

## 2019-06-29 DIAGNOSIS — Z79899 Other long term (current) drug therapy: Secondary | ICD-10-CM | POA: Diagnosis not present

## 2019-06-29 DIAGNOSIS — G309 Alzheimer's disease, unspecified: Secondary | ICD-10-CM | POA: Diagnosis not present

## 2019-06-29 DIAGNOSIS — K219 Gastro-esophageal reflux disease without esophagitis: Secondary | ICD-10-CM | POA: Diagnosis not present

## 2019-06-29 DIAGNOSIS — I10 Essential (primary) hypertension: Secondary | ICD-10-CM | POA: Diagnosis not present

## 2019-08-03 DIAGNOSIS — K219 Gastro-esophageal reflux disease without esophagitis: Secondary | ICD-10-CM | POA: Diagnosis not present

## 2019-08-03 DIAGNOSIS — I10 Essential (primary) hypertension: Secondary | ICD-10-CM | POA: Diagnosis not present

## 2019-08-03 DIAGNOSIS — Z79899 Other long term (current) drug therapy: Secondary | ICD-10-CM | POA: Diagnosis not present

## 2019-08-03 DIAGNOSIS — E782 Mixed hyperlipidemia: Secondary | ICD-10-CM | POA: Diagnosis not present

## 2019-08-03 DIAGNOSIS — G309 Alzheimer's disease, unspecified: Secondary | ICD-10-CM | POA: Diagnosis not present

## 2019-08-24 DIAGNOSIS — K219 Gastro-esophageal reflux disease without esophagitis: Secondary | ICD-10-CM | POA: Diagnosis not present

## 2019-08-24 DIAGNOSIS — G309 Alzheimer's disease, unspecified: Secondary | ICD-10-CM | POA: Diagnosis not present

## 2019-08-24 DIAGNOSIS — I10 Essential (primary) hypertension: Secondary | ICD-10-CM | POA: Diagnosis not present

## 2019-08-24 DIAGNOSIS — Z79899 Other long term (current) drug therapy: Secondary | ICD-10-CM | POA: Diagnosis not present

## 2019-09-02 ENCOUNTER — Emergency Department (HOSPITAL_COMMUNITY): Payer: Medicare Other | Admitting: Anesthesiology

## 2019-09-02 ENCOUNTER — Encounter (HOSPITAL_COMMUNITY)
Admission: EM | Disposition: A | Discharge status: Rehabilitation Facility | Payer: Self-pay | Source: Home / Self Care | Attending: Neurology

## 2019-09-02 ENCOUNTER — Inpatient Hospital Stay (HOSPITAL_COMMUNITY)
Admission: EM | Admit: 2019-09-02 | Discharge: 2019-09-07 | DRG: 023 | Disposition: A | Discharge status: Rehabilitation Facility | Payer: Medicare Other | Attending: Neurology | Admitting: Neurology

## 2019-09-02 ENCOUNTER — Emergency Department (HOSPITAL_COMMUNITY): Payer: Medicare Other

## 2019-09-02 ENCOUNTER — Other Ambulatory Visit: Payer: Self-pay

## 2019-09-02 ENCOUNTER — Encounter (HOSPITAL_COMMUNITY): Payer: Self-pay | Admitting: *Deleted

## 2019-09-02 DIAGNOSIS — Z20822 Contact with and (suspected) exposure to covid-19: Secondary | ICD-10-CM | POA: Diagnosis present

## 2019-09-02 DIAGNOSIS — I609 Nontraumatic subarachnoid hemorrhage, unspecified: Secondary | ICD-10-CM | POA: Diagnosis present

## 2019-09-02 DIAGNOSIS — R52 Pain, unspecified: Secondary | ICD-10-CM | POA: Diagnosis not present

## 2019-09-02 DIAGNOSIS — I350 Nonrheumatic aortic (valve) stenosis: Secondary | ICD-10-CM | POA: Diagnosis not present

## 2019-09-02 DIAGNOSIS — Z6830 Body mass index (BMI) 30.0-30.9, adult: Secondary | ICD-10-CM

## 2019-09-02 DIAGNOSIS — I639 Cerebral infarction, unspecified: Secondary | ICD-10-CM

## 2019-09-02 DIAGNOSIS — R413 Other amnesia: Secondary | ICD-10-CM | POA: Diagnosis present

## 2019-09-02 DIAGNOSIS — R400 Somnolence: Secondary | ICD-10-CM | POA: Diagnosis not present

## 2019-09-02 DIAGNOSIS — F1010 Alcohol abuse, uncomplicated: Secondary | ICD-10-CM | POA: Diagnosis not present

## 2019-09-02 DIAGNOSIS — I4891 Unspecified atrial fibrillation: Secondary | ICD-10-CM | POA: Diagnosis present

## 2019-09-02 DIAGNOSIS — R1310 Dysphagia, unspecified: Secondary | ICD-10-CM | POA: Diagnosis present

## 2019-09-02 DIAGNOSIS — R404 Transient alteration of awareness: Secondary | ICD-10-CM | POA: Diagnosis not present

## 2019-09-02 DIAGNOSIS — E785 Hyperlipidemia, unspecified: Secondary | ICD-10-CM | POA: Diagnosis present

## 2019-09-02 DIAGNOSIS — G4700 Insomnia, unspecified: Secondary | ICD-10-CM | POA: Diagnosis present

## 2019-09-02 DIAGNOSIS — F329 Major depressive disorder, single episode, unspecified: Secondary | ICD-10-CM | POA: Diagnosis present

## 2019-09-02 DIAGNOSIS — Z79899 Other long term (current) drug therapy: Secondary | ICD-10-CM

## 2019-09-02 DIAGNOSIS — H409 Unspecified glaucoma: Secondary | ICD-10-CM | POA: Diagnosis present

## 2019-09-02 DIAGNOSIS — R5383 Other fatigue: Secondary | ICD-10-CM | POA: Diagnosis not present

## 2019-09-02 DIAGNOSIS — Z66 Do not resuscitate: Secondary | ICD-10-CM | POA: Diagnosis not present

## 2019-09-02 DIAGNOSIS — K5900 Constipation, unspecified: Secondary | ICD-10-CM | POA: Diagnosis present

## 2019-09-02 DIAGNOSIS — R29810 Facial weakness: Secondary | ICD-10-CM | POA: Diagnosis present

## 2019-09-02 DIAGNOSIS — F0390 Unspecified dementia without behavioral disturbance: Secondary | ICD-10-CM | POA: Diagnosis present

## 2019-09-02 DIAGNOSIS — R4701 Aphasia: Secondary | ICD-10-CM | POA: Diagnosis present

## 2019-09-02 DIAGNOSIS — I69351 Hemiplegia and hemiparesis following cerebral infarction affecting right dominant side: Secondary | ICD-10-CM

## 2019-09-02 DIAGNOSIS — Z4682 Encounter for fitting and adjustment of non-vascular catheter: Secondary | ICD-10-CM | POA: Diagnosis not present

## 2019-09-02 DIAGNOSIS — Z9049 Acquired absence of other specified parts of digestive tract: Secondary | ICD-10-CM

## 2019-09-02 DIAGNOSIS — M810 Age-related osteoporosis without current pathological fracture: Secondary | ICD-10-CM | POA: Diagnosis present

## 2019-09-02 DIAGNOSIS — I10 Essential (primary) hypertension: Secondary | ICD-10-CM | POA: Diagnosis not present

## 2019-09-02 DIAGNOSIS — I482 Chronic atrial fibrillation, unspecified: Secondary | ICD-10-CM | POA: Diagnosis present

## 2019-09-02 DIAGNOSIS — E669 Obesity, unspecified: Secondary | ICD-10-CM | POA: Diagnosis present

## 2019-09-02 DIAGNOSIS — R296 Repeated falls: Secondary | ICD-10-CM | POA: Diagnosis present

## 2019-09-02 DIAGNOSIS — F39 Unspecified mood [affective] disorder: Secondary | ICD-10-CM | POA: Diagnosis present

## 2019-09-02 DIAGNOSIS — I619 Nontraumatic intracerebral hemorrhage, unspecified: Secondary | ICD-10-CM | POA: Diagnosis not present

## 2019-09-02 DIAGNOSIS — I63511 Cerebral infarction due to unspecified occlusion or stenosis of right middle cerebral artery: Secondary | ICD-10-CM | POA: Diagnosis present

## 2019-09-02 DIAGNOSIS — Z8249 Family history of ischemic heart disease and other diseases of the circulatory system: Secondary | ICD-10-CM

## 2019-09-02 DIAGNOSIS — E871 Hypo-osmolality and hyponatremia: Secondary | ICD-10-CM | POA: Diagnosis present

## 2019-09-02 DIAGNOSIS — Z4659 Encounter for fitting and adjustment of other gastrointestinal appliance and device: Secondary | ICD-10-CM

## 2019-09-02 DIAGNOSIS — K219 Gastro-esophageal reflux disease without esophagitis: Secondary | ICD-10-CM | POA: Diagnosis present

## 2019-09-02 DIAGNOSIS — Z90710 Acquired absence of both cervix and uterus: Secondary | ICD-10-CM | POA: Diagnosis not present

## 2019-09-02 DIAGNOSIS — R4182 Altered mental status, unspecified: Secondary | ICD-10-CM | POA: Diagnosis not present

## 2019-09-02 DIAGNOSIS — N390 Urinary tract infection, site not specified: Secondary | ICD-10-CM | POA: Diagnosis present

## 2019-09-02 DIAGNOSIS — R233 Spontaneous ecchymoses: Secondary | ICD-10-CM | POA: Diagnosis present

## 2019-09-02 DIAGNOSIS — R29722 NIHSS score 22: Secondary | ICD-10-CM | POA: Diagnosis present

## 2019-09-02 DIAGNOSIS — J9600 Acute respiratory failure, unspecified whether with hypoxia or hypercapnia: Secondary | ICD-10-CM

## 2019-09-02 DIAGNOSIS — I63413 Cerebral infarction due to embolism of bilateral middle cerebral arteries: Secondary | ICD-10-CM | POA: Diagnosis not present

## 2019-09-02 DIAGNOSIS — R339 Retention of urine, unspecified: Secondary | ICD-10-CM

## 2019-09-02 DIAGNOSIS — Z87891 Personal history of nicotine dependence: Secondary | ICD-10-CM

## 2019-09-02 DIAGNOSIS — I6602 Occlusion and stenosis of left middle cerebral artery: Secondary | ICD-10-CM | POA: Diagnosis not present

## 2019-09-02 DIAGNOSIS — R1312 Dysphagia, oropharyngeal phase: Secondary | ICD-10-CM | POA: Diagnosis present

## 2019-09-02 DIAGNOSIS — I213 ST elevation (STEMI) myocardial infarction of unspecified site: Secondary | ICD-10-CM | POA: Diagnosis not present

## 2019-09-02 DIAGNOSIS — Z9911 Dependence on respirator [ventilator] status: Secondary | ICD-10-CM | POA: Diagnosis not present

## 2019-09-02 DIAGNOSIS — B964 Proteus (mirabilis) (morganii) as the cause of diseases classified elsewhere: Secondary | ICD-10-CM | POA: Diagnosis present

## 2019-09-02 DIAGNOSIS — J9601 Acute respiratory failure with hypoxia: Secondary | ICD-10-CM | POA: Diagnosis not present

## 2019-09-02 DIAGNOSIS — M5030 Other cervical disc degeneration, unspecified cervical region: Secondary | ICD-10-CM | POA: Diagnosis present

## 2019-09-02 DIAGNOSIS — J9811 Atelectasis: Secondary | ICD-10-CM | POA: Diagnosis not present

## 2019-09-02 DIAGNOSIS — I2510 Atherosclerotic heart disease of native coronary artery without angina pectoris: Secondary | ICD-10-CM | POA: Diagnosis present

## 2019-09-02 DIAGNOSIS — I69391 Dysphagia following cerebral infarction: Secondary | ICD-10-CM | POA: Diagnosis not present

## 2019-09-02 DIAGNOSIS — Z885 Allergy status to narcotic agent status: Secondary | ICD-10-CM

## 2019-09-02 DIAGNOSIS — I63512 Cerebral infarction due to unspecified occlusion or stenosis of left middle cerebral artery: Secondary | ICD-10-CM | POA: Diagnosis not present

## 2019-09-02 DIAGNOSIS — I6389 Other cerebral infarction: Secondary | ICD-10-CM | POA: Diagnosis not present

## 2019-09-02 DIAGNOSIS — Z7982 Long term (current) use of aspirin: Secondary | ICD-10-CM | POA: Diagnosis not present

## 2019-09-02 DIAGNOSIS — G3184 Mild cognitive impairment, so stated: Secondary | ICD-10-CM | POA: Diagnosis present

## 2019-09-02 DIAGNOSIS — G936 Cerebral edema: Secondary | ICD-10-CM | POA: Diagnosis present

## 2019-09-02 DIAGNOSIS — I4819 Other persistent atrial fibrillation: Secondary | ICD-10-CM | POA: Diagnosis not present

## 2019-09-02 DIAGNOSIS — I69320 Aphasia following cerebral infarction: Secondary | ICD-10-CM | POA: Diagnosis not present

## 2019-09-02 DIAGNOSIS — J9690 Respiratory failure, unspecified, unspecified whether with hypoxia or hypercapnia: Secondary | ICD-10-CM | POA: Diagnosis not present

## 2019-09-02 DIAGNOSIS — R569 Unspecified convulsions: Secondary | ICD-10-CM | POA: Diagnosis not present

## 2019-09-02 HISTORY — PX: RADIOLOGY WITH ANESTHESIA: SHX6223

## 2019-09-02 HISTORY — PX: IR PERCUTANEOUS ART THROMBECTOMY/INFUSION INTRACRANIAL INC DIAG ANGIO: IMG6087

## 2019-09-02 LAB — CBC
HCT: 47 % — ABNORMAL HIGH (ref 36.0–46.0)
Hemoglobin: 14.9 g/dL (ref 12.0–15.0)
MCH: 31.6 pg (ref 26.0–34.0)
MCHC: 31.7 g/dL (ref 30.0–36.0)
MCV: 99.8 fL (ref 80.0–100.0)
Platelets: 232 10*3/uL (ref 150–400)
RBC: 4.71 MIL/uL (ref 3.87–5.11)
RDW: 12.4 % (ref 11.5–15.5)
WBC: 8.4 10*3/uL (ref 4.0–10.5)
nRBC: 0 % (ref 0.0–0.2)

## 2019-09-02 LAB — CBG MONITORING, ED: Glucose-Capillary: 134 mg/dL — ABNORMAL HIGH (ref 70–99)

## 2019-09-02 LAB — I-STAT CHEM 8, ED
BUN: 27 mg/dL — ABNORMAL HIGH (ref 8–23)
Calcium, Ion: 1.04 mmol/L — ABNORMAL LOW (ref 1.15–1.40)
Chloride: 104 mmol/L (ref 98–111)
Creatinine, Ser: 0.8 mg/dL (ref 0.44–1.00)
Glucose, Bld: 138 mg/dL — ABNORMAL HIGH (ref 70–99)
HCT: 46 % (ref 36.0–46.0)
Hemoglobin: 15.6 g/dL — ABNORMAL HIGH (ref 12.0–15.0)
Potassium: 4.3 mmol/L (ref 3.5–5.1)
Sodium: 136 mmol/L (ref 135–145)
TCO2: 21 mmol/L — ABNORMAL LOW (ref 22–32)

## 2019-09-02 LAB — COMPREHENSIVE METABOLIC PANEL
ALT: 15 U/L (ref 0–44)
AST: 25 U/L (ref 15–41)
Albumin: 4.2 g/dL (ref 3.5–5.0)
Alkaline Phosphatase: 63 U/L (ref 38–126)
Anion gap: 12 (ref 5–15)
BUN: 22 mg/dL (ref 8–23)
CO2: 18 mmol/L — ABNORMAL LOW (ref 22–32)
Calcium: 9.5 mg/dL (ref 8.9–10.3)
Chloride: 104 mmol/L (ref 98–111)
Creatinine, Ser: 0.85 mg/dL (ref 0.44–1.00)
GFR calc Af Amer: >60 mL/min (ref >60)
GFR calc non Af Amer: >60 mL/min (ref >60)
Glucose, Bld: 136 mg/dL — ABNORMAL HIGH (ref 70–99)
Potassium: 4.6 mmol/L (ref 3.5–5.1)
Sodium: 134 mmol/L — ABNORMAL LOW (ref 135–145)
Total Bilirubin: 1 mg/dL (ref 0.3–1.2)
Total Protein: 7.2 g/dL (ref 6.5–8.1)

## 2019-09-02 LAB — DIFFERENTIAL
Abs Immature Granulocytes: 0.05 10*3/uL (ref 0.00–0.07)
Basophils Absolute: 0 10*3/uL (ref 0.0–0.1)
Basophils Relative: 1 %
Eosinophils Absolute: 0 10*3/uL (ref 0.0–0.5)
Eosinophils Relative: 0 %
Immature Granulocytes: 1 %
Lymphocytes Relative: 14 %
Lymphs Abs: 1.2 10*3/uL (ref 0.7–4.0)
Monocytes Absolute: 0.4 10*3/uL (ref 0.1–1.0)
Monocytes Relative: 5 %
Neutro Abs: 6.7 10*3/uL (ref 1.7–7.7)
Neutrophils Relative %: 79 %

## 2019-09-02 LAB — SARS CORONAVIRUS 2 BY RT PCR (HOSPITAL ORDER, PERFORMED IN ~~LOC~~ HOSPITAL LAB): SARS Coronavirus 2: NEGATIVE

## 2019-09-02 LAB — APTT: aPTT: 34 seconds (ref 24–36)

## 2019-09-02 LAB — PROTIME-INR
INR: 1 (ref 0.8–1.2)
Prothrombin Time: 12.7 seconds (ref 11.4–15.2)

## 2019-09-02 LAB — TRIGLYCERIDES: Triglycerides: 97 mg/dL (ref <150)

## 2019-09-02 SURGERY — IR WITH ANESTHESIA
Anesthesia: General

## 2019-09-02 MED ORDER — VITAMIN B-12 1000 MCG PO TABS: 3000.0000 ug | ORAL_TABLET | Freq: Every day | ORAL | Status: DC

## 2019-09-02 MED ORDER — IOHEXOL 350 MG/ML SOLN
100.0000 mL | Freq: Once | INTRAVENOUS | Status: AC | PRN
  Administered 2019-09-02: 100 mL via INTRAVENOUS

## 2019-09-02 MED ORDER — EPTIFIBATIDE 20 MG/10ML IV SOLN
INTRAVENOUS | Status: AC
  Filled 2019-09-02: qty 10

## 2019-09-02 MED ORDER — SODIUM CHLORIDE 0.9 % IV SOLN: INTRAVENOUS | Status: DC

## 2019-09-02 MED ORDER — TICAGRELOR 90 MG PO TABS
ORAL_TABLET | ORAL | Status: AC
  Filled 2019-09-02: qty 2

## 2019-09-02 MED ORDER — PROPOFOL 1000 MG/100ML IV EMUL
5.0000 ug/kg/min | INTRAVENOUS | Status: DC
  Administered 2019-09-02: 20 ug/kg/min via INTRAVENOUS
  Administered 2019-09-03: 10 ug/kg/min via INTRAVENOUS
  Filled 2019-09-02 (×2): qty 100

## 2019-09-02 MED ORDER — SUCCINYLCHOLINE CHLORIDE 20 MG/ML IJ SOLN
INTRAMUSCULAR | Status: DC | PRN
  Administered 2019-09-02: 120 mg via INTRAVENOUS

## 2019-09-02 MED ORDER — CLEVIDIPINE BUTYRATE 0.5 MG/ML IV EMUL
0.0000 mg/h | INTRAVENOUS | Status: DC
  Administered 2019-09-02: 1 mg/h via INTRAVENOUS
  Administered 2019-09-02: 3 mg/h via INTRAVENOUS
  Administered 2019-09-03: 6 mg/h via INTRAVENOUS
  Administered 2019-09-03: 4 mg/h via INTRAVENOUS
  Filled 2019-09-02 (×4): qty 50

## 2019-09-02 MED ORDER — ONDANSETRON HCL 4 MG/2ML IJ SOLN: 4.0000 mg | Freq: Four times a day (QID) | INTRAMUSCULAR | Status: DC | PRN

## 2019-09-02 MED ORDER — TIROFIBAN HCL IN NACL 5-0.9 MG/100ML-% IV SOLN
INTRAVENOUS | Status: AC
  Filled 2019-09-02: qty 100

## 2019-09-02 MED ORDER — FENTANYL CITRATE (PF) 250 MCG/5ML IJ SOLN
INTRAMUSCULAR | Status: AC
  Filled 2019-09-02: qty 5

## 2019-09-02 MED ORDER — IOHEXOL 300 MG/ML  SOLN
50.0000 mL | Freq: Once | INTRAMUSCULAR | Status: AC | PRN
  Administered 2019-09-02: 5 mL via INTRA_ARTERIAL

## 2019-09-02 MED ORDER — VERAPAMIL HCL 2.5 MG/ML IV SOLN
INTRAVENOUS | Status: AC
  Administered 2019-09-02: 10 mg via INTRA_ARTERIAL
  Filled 2019-09-02: qty 2

## 2019-09-02 MED ORDER — PHENYLEPHRINE HCL-NACL 10-0.9 MG/250ML-% IV SOLN
0.0000 ug/min | INTRAVENOUS | Status: DC
  Administered 2019-09-02: 10 ug/min via INTRAVENOUS

## 2019-09-02 MED ORDER — PHENYLEPHRINE 40 MCG/ML (10ML) SYRINGE FOR IV PUSH (FOR BLOOD PRESSURE SUPPORT)
PREFILLED_SYRINGE | INTRAVENOUS | Status: DC | PRN
  Administered 2019-09-02: 40 ug via INTRAVENOUS
  Administered 2019-09-02 (×2): 80 ug via INTRAVENOUS

## 2019-09-02 MED ORDER — PANTOPRAZOLE SODIUM 40 MG IV SOLR
40.0000 mg | Freq: Every day | INTRAVENOUS | Status: DC
  Administered 2019-09-02: 40 mg via INTRAVENOUS
  Filled 2019-09-02: qty 40

## 2019-09-02 MED ORDER — ADULT MULTIVITAMIN W/MINERALS CH: 1.0000 | ORAL_TABLET | Freq: Every day | ORAL | Status: DC

## 2019-09-02 MED ORDER — MELATONIN 5 MG PO TABS
10.0000 mg | ORAL_TABLET | Freq: Every day | ORAL | Status: DC
  Filled 2019-09-02: qty 2

## 2019-09-02 MED ORDER — SODIUM CHLORIDE 0.9 % IV SOLN: INTRAVENOUS | Status: DC | PRN

## 2019-09-02 MED ORDER — SENNOSIDES-DOCUSATE SODIUM 8.6-50 MG PO TABS
1.0000 | ORAL_TABLET | Freq: Every evening | ORAL | Status: DC | PRN
  Filled 2019-09-02: qty 1

## 2019-09-02 MED ORDER — TIMOLOL MALEATE 0.5 % OP SOLN
1.0000 [drp] | Freq: Every day | OPHTHALMIC | Status: DC
  Administered 2019-09-03 – 2019-09-07 (×5): 1 [drp] via OPHTHALMIC
  Filled 2019-09-02: qty 5

## 2019-09-02 MED ORDER — SIMVASTATIN 20 MG PO TABS: 20.0000 mg | ORAL_TABLET | Freq: Every day | ORAL | Status: DC

## 2019-09-02 MED ORDER — METOPROLOL TARTRATE 25 MG PO TABS: 25.0000 mg | ORAL_TABLET | Freq: Two times a day (BID) | ORAL | Status: DC

## 2019-09-02 MED ORDER — THIAMINE HCL 100 MG/ML IJ SOLN
100.0000 mg | Freq: Every day | INTRAMUSCULAR | Status: DC
  Administered 2019-09-02 – 2019-09-03 (×2): 100 mg via INTRAVENOUS
  Filled 2019-09-02 (×2): qty 2

## 2019-09-02 MED ORDER — ORAL CARE MOUTH RINSE
15.0000 mL | OROMUCOSAL | Status: DC
  Administered 2019-09-02 – 2019-09-07 (×39): 15 mL via OROMUCOSAL

## 2019-09-02 MED ORDER — ASPIRIN 81 MG PO CHEW
CHEWABLE_TABLET | ORAL | Status: AC
  Filled 2019-09-02: qty 1

## 2019-09-02 MED ORDER — PHENYLEPHRINE HCL-NACL 10-0.9 MG/250ML-% IV SOLN: 25.0000 ug/min | INTRAVENOUS | Status: DC

## 2019-09-02 MED ORDER — GLYCOPYRROLATE 0.2 MG/ML IJ SOLN
INTRAMUSCULAR | Status: DC | PRN
  Administered 2019-09-02: 2 mg via INTRAVENOUS

## 2019-09-02 MED ORDER — SODIUM CHLORIDE 0.9% FLUSH: 3.0000 mL | Freq: Once | INTRAVENOUS | Status: DC

## 2019-09-02 MED ORDER — ACETAMINOPHEN 650 MG RE SUPP: 650.0000 mg | RECTAL | Status: DC | PRN

## 2019-09-02 MED ORDER — VERAPAMIL HCL 2.5 MG/ML IV SOLN
INTRAVENOUS | Status: AC
  Filled 2019-09-02: qty 2

## 2019-09-02 MED ORDER — FOLIC ACID 1 MG PO TABS: 1.0000 mg | ORAL_TABLET | Freq: Every day | ORAL | Status: DC

## 2019-09-02 MED ORDER — SODIUM CHLORIDE 0.9 % IV SOLN: 250.0000 mL | INTRAVENOUS | Status: DC

## 2019-09-02 MED ORDER — PROPOFOL 10 MG/ML IV BOLUS
INTRAVENOUS | Status: DC | PRN
  Administered 2019-09-02: 160 mg via INTRAVENOUS
  Administered 2019-09-02: 40 mg via INTRAVENOUS

## 2019-09-02 MED ORDER — THIAMINE HCL 100 MG PO TABS: 100.0000 mg | ORAL_TABLET | Freq: Every day | ORAL | Status: DC

## 2019-09-02 MED ORDER — SERTRALINE HCL 50 MG PO TABS: 50.0000 mg | ORAL_TABLET | Freq: Every day | ORAL | Status: DC

## 2019-09-02 MED ORDER — CLOPIDOGREL BISULFATE 300 MG PO TABS
ORAL_TABLET | ORAL | Status: AC
  Filled 2019-09-02: qty 1

## 2019-09-02 MED ORDER — ACETAMINOPHEN 160 MG/5ML PO SOLN
650.0000 mg | ORAL | Status: DC | PRN
  Administered 2019-09-03: 650 mg
  Filled 2019-09-02: qty 20.3

## 2019-09-02 MED ORDER — NITROGLYCERIN 1 MG/10 ML FOR IR/CATH LAB
INTRA_ARTERIAL | Status: AC
  Filled 2019-09-02: qty 10

## 2019-09-02 MED ORDER — CHLORHEXIDINE GLUCONATE CLOTH 2 % EX PADS
6.0000 | MEDICATED_PAD | Freq: Every day | CUTANEOUS | Status: DC
  Administered 2019-09-02 – 2019-09-06 (×4): 6 via TOPICAL

## 2019-09-02 MED ORDER — FENTANYL CITRATE (PF) 100 MCG/2ML IJ SOLN
INTRAMUSCULAR | Status: DC | PRN
  Administered 2019-09-02: 100 ug via INTRAVENOUS

## 2019-09-02 MED ORDER — PHENYLEPHRINE HCL-NACL 10-0.9 MG/250ML-% IV SOLN
INTRAVENOUS | Status: DC | PRN
  Administered 2019-09-02: 25 ug/min via INTRAVENOUS

## 2019-09-02 MED ORDER — STROKE: EARLY STAGES OF RECOVERY BOOK: Freq: Once | Status: DC

## 2019-09-02 MED ORDER — ROCURONIUM BROMIDE 100 MG/10ML IV SOLN
INTRAVENOUS | Status: DC | PRN
  Administered 2019-09-02: 50 mg via INTRAVENOUS

## 2019-09-02 MED ORDER — LORAZEPAM 2 MG/ML IJ SOLN
INTRAMUSCULAR | Status: AC
  Filled 2019-09-02: qty 1

## 2019-09-02 MED ORDER — DONEPEZIL HCL 10 MG PO TABS: 10.0000 mg | ORAL_TABLET | Freq: Every day | ORAL | Status: DC

## 2019-09-02 MED ORDER — ACETAMINOPHEN 325 MG PO TABS: 650.0000 mg | ORAL_TABLET | ORAL | Status: DC | PRN

## 2019-09-02 MED ORDER — IOHEXOL 240 MG/ML SOLN
INTRAMUSCULAR | Status: AC
  Filled 2019-09-02: qty 200

## 2019-09-02 MED ORDER — IOHEXOL 240 MG/ML SOLN
150.0000 mL | Freq: Once | INTRAMUSCULAR | Status: AC | PRN
  Administered 2019-09-02: 60 mL via INTRA_ARTERIAL

## 2019-09-02 MED ORDER — LIDOCAINE HCL (CARDIAC) PF 100 MG/5ML IV SOSY
PREFILLED_SYRINGE | INTRAVENOUS | Status: DC | PRN
  Administered 2019-09-02: 60 mg via INTRAVENOUS

## 2019-09-02 MED ORDER — CHLORHEXIDINE GLUCONATE 0.12% ORAL RINSE (MEDLINE KIT)
15.0000 mL | Freq: Two times a day (BID) | OROMUCOSAL | Status: DC
  Administered 2019-09-02 – 2019-09-07 (×10): 15 mL via OROMUCOSAL

## 2019-09-02 MED ORDER — ONDANSETRON HCL 4 MG/2ML IJ SOLN
INTRAMUSCULAR | Status: DC | PRN
  Administered 2019-09-02: 4 mg via INTRAVENOUS

## 2019-09-02 NOTE — Consult Note (Signed)
NAME:  Abbegail G Stejskal, MRN:  [id number], DOB:  1932/02/07, LOS: 0 ADMISSION DATE:  09/02/2019, CONSULTATION DATE: 09/02/2019 REFERRING MD: Dr. Curly Shores, CHIEF COMPLAINT: Intubation  Brief History   Dameshia Reith is an 86 year old female with past medical history of A. fib not on anticoagulation due to frequent falling and prior subdural hematoma, hypertension, memory loss, GERD who was admitted to the hospital for altered mental status, found to have a left MCA stroke status post thrombectomy.  History of present illness   Per chart review, patient was found unresponsive on the floor in her assisted living facility.  Unknown downtime.  She was not able to move her right side.  CT head shows an acute left MCA infarct.  CTA showed new occlusion of thrombus of left MCA which she underwent thrombectomy.  Patient was intubated for airway protection and the procedure.  She is admitted to ICU.  Her daughter were at bedside for questions.  Her daughter states that the patient [place] the [place] she was doing well.  Patient is independent at baseline and is driving.  She states that they saw cardiologist last month.  Patient is sedated and on ventilator.  She does open her eyes but has no purposeful movements.  She is minimally responsive to verbal or physical stimulation.  Past Medical History  Atrial fibrillation not on anticoagulation due to prior subdural hematoma Hypertension  Hyperlipidemia Memory loss GERD  Significant Hospital Events   8/14 admitted to the ICU  Consults:  PCCM Neurology stroke  Procedures:  8/14 ETT 8/14 left MCA thrombectomy  Significant Diagnostic Tests:  8/14 CTA head: No acute intracranial hemorrhage. 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 ASPECT score of 8. There is nearly occlusive thrombus within the proximal left M1 MCA extending into an M2 branch. Perfusion imaging demonstrates core infarction of 4 mL (corresponds  to noncontrast CT finding) and penumbra of 83 mL in the left MCA territory.  Micro Data:  Covid negative  Antimicrobials:  None  Interim history/subjective:  As above  Objective   Blood pressure (!) 158/80, pulse 63, temperature (!) 96.3 F (35.7 C), temperature source Axillary, resp. rate 16, height 5\' 5"  (1.651 m), weight 84.3 kg, SpO2 100 %.    Vent Mode: PRVC FiO2 (%):  [50 %] 50 % Set Rate:  [12 bmp] 12 bmp Vt Set:  [500 mL] 500 mL PEEP:  [5 cmH20] 5 cmH20 Plateau Pressure:  [17 cmH20] 17 cmH20   Intake/Output Summary (Last 24 hours) at 09/02/2019 1527 Last data filed at 09/02/2019 1500 Gross per 24 hour  Intake 24.85 ml  Output --  Net 24.85 ml   Filed Weights   09/02/19 1210  Weight: 84.3 kg    Examination: Physical Exam Constitutional:      Comments: Sedated, minimally responsive to physical and verbal stimulation  HENT:     Head: Normocephalic.  Eyes:     General: No scleral icterus.       Right eye: No discharge.        Left eye: No discharge.     Pupils: Pupils are equal, round, and reactive to light.  Cardiovascular:     Rate and Rhythm: Normal rate. Rhythm irregular.     Heart sounds: No murmur heard.   Pulmonary:     Effort: No respiratory distress.  Abdominal:     General: Bowel sounds are normal. There is no distension.     Palpations: Abdomen is  soft.  Musculoskeletal:     Right lower leg: No edema.     Left lower leg: No edema.  Skin:    General: Skin is warm.     Coloration: Skin is not jaundiced.  Neurological:     Comments: PERRLA Minimally responsive to verbal and physical stimulation     Resolved Hospital Problem list     Assessment & Plan:  Left MCA stroke status post thrombectomy Acute respiratory failure requiring mechanical ventilation secondary to CVA Hypertension Atrial fibrillation  Plan:  -Neurology will be following the patient.  -Pending echocardiogram -Pending brain MRI -Continue Cleviprex to maintain  systolic blood pressure of 120 to 140 -Continue ventilator PRVC/5/12/500/FiO2 50.  Wean as tolerated  -Patient is not on any sedation.  We will try to wake the patient up and assess for mental status before extubation.  Limit sedation as much as possible -Tube care and oral suction -Her A. fib is rate controlled.  Currently not on anticoagulation due to frequent falls and prior subdural hematoma.    Best practice:  Diet: N.p.o. Pain/Anxiety/Delirium protocol (if indicated): Propofol VAP protocol (if indicated): Yes DVT prophylaxis: SCD GI prophylaxis: PPI Glucose control: N/A Mobility: Bed rest Code Status: Full Family Communication: spoken with 2 daughters Disposition: ICU for ventilator management  Critical care time: 20 min     Gaylan Gerold, DO Internal Medicine Residency My pager: [phone]

## 2019-09-02 NOTE — Anesthesia Procedure Notes (Signed)
Date/Time: 09/02/2019 12:25 PM Performed by: Sammie Bench, CRNA Pre-anesthesia Checklist: Patient identified, Emergency Drugs available, Suction available, Patient being monitored and Timeout performed Patient Re-evaluated:Patient Re-evaluated prior to induction Oxygen Delivery Method: Ambu bag Preoxygenation: Pre-oxygenation with 100% oxygen Induction Type: IV induction, Rapid sequence and Cricoid Pressure applied Laryngoscope Size: 4 and Glidescope Grade View: Grade I Tube size: 7.0 mm Number of attempts: 1 Secured at: 21 cm Tube secured with: Tape Dental Injury: Teeth and Oropharynx as per pre-operative assessment

## 2019-09-02 NOTE — Anesthesia Postprocedure Evaluation (Signed)
Anesthesia Post Note  Patient: [name]  Procedure(s) Performed: IR WITH ANESTHESIA (N/A )     Patient location during evaluation: SICU Anesthesia Type: General Level of consciousness: sedated Pain management: pain level controlled Vital Signs Assessment: post-procedure vital signs reviewed and stable Respiratory status: patient remains intubated per anesthesia plan Cardiovascular status: stable Postop Assessment: no apparent nausea or vomiting Anesthetic complications: no   No complications documented.  Last Vitals:  Vitals:   09/02/19 1900 09/02/19 1915  BP: 138/73 140/88  Pulse: 85 64  Resp: 15 16  Temp:    SpO2: 100% 100%    Last Pain:  Vitals:   09/02/19 1600  TempSrc: Axillary                 Darcy Cordner

## 2019-09-02 NOTE — Anesthesia Preprocedure Evaluation (Addendum)
Anesthesia Evaluation    Reviewed: Unable to perform ROS - Chart review onlyPreop documentation limited or incomplete due to emergent nature of procedure.  Airway        Dental   Pulmonary former smoker,           Cardiovascular hypertension,   - Left ventricle: The cavity size was normal. Systolic function was  vigorous. The estimated ejection fraction was in the range of 65%  to 70%. Wall motion was normal; there were no regional wall  motion abnormalities.  - Aortic valve: Trileaflet; mildly thickened, mildly calcified  leaflets. There was trivial regurgitation.  - Left atrium: The atrium was mildly dilated.  - Right atrium: The atrium was moderately dilated.    Neuro/Psych    GI/Hepatic   Endo/Other    Renal/GU      Musculoskeletal   Abdominal   Peds  Hematology   Anesthesia Other Findings   Reproductive/Obstetrics                             Anesthesia Physical Anesthesia Plan  ASA: III  Anesthesia Plan: General   Post-op Pain Management:    Induction: Intravenous, Cricoid pressure planned and Rapid sequence  PONV Risk Score and Plan: 3 and Ondansetron and Dexamethasone  Airway Management Planned: Oral ETT  Additional Equipment:   Intra-op Plan:   Post-operative Plan: Possible Post-op intubation/ventilation  Informed Consent:     History available from chart only and Only emergency history available  Plan Discussed with: CRNA  Anesthesia Plan Comments:         Anesthesia Quick Evaluation

## 2019-09-02 NOTE — Transfer of Care (Signed)
Immediate Anesthesia Transfer of Care Note  Patient: [name]  Procedure(s) Performed: IR WITH ANESTHESIA (N/A )  Patient Location: PACU and ICU  Anesthesia Type:General  Level of Consciousness: sedated and Patient remains intubated per anesthesia plan  Airway & Oxygen Therapy: Patient remains intubated per anesthesia plan and Patient placed on Ventilator (see vital sign flow sheet for setting)  Post-op Assessment: Report given to RN and Post -op Vital signs reviewed and stable  Post vital signs: Reviewed and stable  Last Vitals:  Vitals Value Taken Time  BP 148/114 09/02/19 1417  Temp    Pulse 61 09/02/19 1419  Resp 12 09/02/19 1426  SpO2 100 % 09/02/19 1419  Vitals shown include unvalidated device data.  Last Pain:  Vitals:   09/02/19 1209  TempSrc: Temporal         Complications: No complications documented.

## 2019-09-02 NOTE — ED Provider Notes (Signed)
[facility] EMERGENCY DEPARTMENT Provider Note   CSN: [id number] Arrival date & time: 09/02/19  1125     History Chief Complaint  Patient presents with  . Code Stroke    [name] is a 86 y.o. female.  Patient is an 86 year old female with history of atrial fibrillation, hypertension, GERD, prior subdural hematoma.  Patient presents today by EMS for evaluation of altered mental status.  Patient stays at an independent living facility where she apparently called family members this morning and seemed altered.  The family called the facility to have them check on her.  Patient was apparently found on the floor not responding appropriately.  She was apparently not moving her right side and would not respond to them.  911 was called and the patient was transported here in the form of a code stroke.  The history is provided by the patient.       Past Medical History:  Diagnosis Date  . Arthritis   . Atrial fibrillation ([place])   . Cataract    BILATERAL  . DDD (degenerative disc disease), cervical   . Depression   . Diverticulosis   . Falls   . GERD (gastroesophageal reflux disease)   . Glaucoma   . Headache   . Hyperlipidemia   . Hyperlipidemia   . Hypertension   . Osteoporosis   . Peripheral neuropathy   . Spondylosis    lumbar  . Subdural hematoma [facility])     Patient Active Problem List   Diagnosis Date Noted  . HTN (hypertension) 06/21/2019  . Mild cognitive impairment 04/02/2016  . Falls 05/18/2015  . Atrial fibrillation ([place]) 10/30/2014  . Whiplash 10/16/2014  . Memory changes 10/16/2014  . Hemorrhage of rectum and anus 10/13/2013    Past Surgical History:  Procedure Laterality Date  . APPENDECTOMY    . BACK SURGERY  2010  . BURR HOLE FOR SUBDURAL HEMATOMA     x 2 hematoma  . COLONOSCOPY    . KNEE SURGERY Bilateral   . VAGINAL HYSTERECTOMY       OB History   No obstetric history on file.     Family History  Problem Relation  Age of Onset  . Heart disease Father   . Ovarian cancer Mother        ?  Marland Kitchen Breast cancer Sister   . Colon cancer Neg Hx   . Diabetes Neg Hx   . Liver disease Neg Hx   . Kidney disease Neg Hx   . Dementia Neg Hx     Social History   Tobacco Use  . Smoking status: Former Research scientist (life sciences)  . Smokeless tobacco: Never Used  . Tobacco comment: [place]  Vaping Use  . Vaping Use: Never used  Substance Use Topics  . Alcohol use: Yes    Alcohol/week: 14.0 standard drinks    Types: 14 Glasses of wine per week    Comment: 2 glasses of wine per nite  . Drug use: No    Home Medications Prior to Admission medications   Medication Sig Start Date End Date Taking? Authorizing Provider  acetaminophen (TYLENOL) 650 MG CR tablet Take 650 mg by mouth every 8 (eight) hours as needed for pain.    [provider]  amLODipine (NORVASC) 10 MG tablet Take 10 mg by mouth daily.  04/23/17   [provider]  betamethasone dipropionate 0.05 % lotion Apply 1 application topically as needed.  [provider]  donepezil (ARICEPT) 10 MG tablet Take 10 mg by mouth at bedtime.    [provider]  latanoprost (XALATAN) 0.005 % ophthalmic solution Place 1 drop into both eyes at bedtime.    [provider]  loratadine (CLARITIN) 10 MG tablet Take 10 mg by mouth daily.    [provider]  Melatonin 10 MG TABS Take 10-15 mg by mouth at bedtime.    [provider]  meloxicam (MOBIC) 15 MG tablet Take 15 mg by mouth daily.    [provider]  metoprolol tartrate (LOPRESSOR) 50 MG tablet Take 50 mg by mouth 2 (two) times daily.     [provider]  omeprazole (PRILOSEC) 40 MG capsule Take 1 capsule by mouth daily. 09/08/18   [provider]  sertraline (ZOLOFT) 50 MG tablet Take 1 tablet by mouth daily. 09/08/18   [provider]  simethicone (MYLICON) 644 MG chewable tablet Chew 125 mg by mouth as  directed.    [provider]  simvastatin (ZOCOR) 20 MG tablet Take 20 mg by mouth daily.    [provider]  timolol (TIMOPTIC) 0.5 % ophthalmic solution Place 1 drop into both eyes daily.    [provider]  vitamin B-12 (CYANOCOBALAMIN) 1000 MCG tablet Take 3,000 mcg by mouth daily.    [provider]    Allergies    Codeine  Review of Systems   Review of Systems  Unable to perform ROS: Mental status change    Physical Exam Updated Vital Signs BP (!) 190/86 (BP Location: Left Arm)   Pulse 93   Temp 98.1 F (36.7 C) (Temporal)   Resp 20   Ht 5\' 5"  (1.651 m)   Wt 84.3 kg   SpO2 100%   BMI 30.93 kg/m   Physical Exam Vitals and nursing note reviewed.  Constitutional:      General: She is not in acute distress.    Appearance: She is well-developed. She is not diaphoretic.  HENT:     Head: Normocephalic and atraumatic.  Cardiovascular:     Rate and Rhythm: Normal rate and regular rhythm.     Heart sounds: No murmur heard.  No friction rub. No gallop.   Pulmonary:     Effort: Pulmonary effort is normal. No respiratory distress.     Breath sounds: Normal breath sounds. No wheezing.  Abdominal:     General: Bowel sounds are normal. There is no distension.     Palpations: Abdomen is soft.     Tenderness: There is no abdominal tenderness.  Musculoskeletal:        General: Normal range of motion.     Cervical back: Normal range of motion and neck supple.  Skin:    General: Skin is warm and dry.  Neurological:     Mental Status: She is alert.     Comments: Patient is awake and alert, but will not respond to commands.  She attempts to speak, however speech is garbled.  Neurologic exam difficult secondary to altered mental status, but she does move all extremities.     ED Results / Procedures / Treatments   Labs (all labs ordered are listed, but only abnormal results are displayed) Labs Reviewed  CBC - Abnormal; Notable for the  following components:      Result Value   HCT 47.0 (*)    All other components within normal limits  COMPREHENSIVE METABOLIC PANEL - Abnormal; Notable for the following components:  Sodium 134 (*)    CO2 18 (*)    Glucose, Bld 136 (*)    All other components within normal limits  I-STAT CHEM 8, ED - Abnormal; Notable for the following components:   BUN 27 (*)    Glucose, Bld 138 (*)    Calcium, Ion 1.04 (*)    TCO2 21 (*)    Hemoglobin 15.6 (*)    All other components within normal limits  CBG MONITORING, ED - Abnormal; Notable for the following components:   Glucose-Capillary 134 (*)    All other components within normal limits  PROTIME-INR  APTT  DIFFERENTIAL    EKG None  Radiology CT CEREBRAL PERFUSION W CONTRAST  Result Date: 09/02/2019 CLINICAL DATA:  Code stroke EXAM: CT HEAD WITHOUT CONTRAST CT ANGIOGRAPHY HEAD AND NECK CT PERFUSION BRAIN TECHNIQUE: Multidetector CT imaging of the head and neck was performed using the standard protocol during bolus administration of intravenous contrast. Multiplanar CT image reconstructions and MIPs were obtained to evaluate the vascular anatomy. Carotid stenosis measurements (when applicable) are obtained utilizing NASCET criteria, using the distal internal carotid diameter as the denominator. Multiphase CT imaging of the brain was performed following IV bolus contrast injection. Subsequent parametric perfusion maps were calculated using RAPID software. CONTRAST:  164mL OMNIPAQUE IOHEXOL 350 MG/ML SOLN COMPARISON:  2020 head CT FINDINGS: Brain: There is no acute intracranial hemorrhage. Suspected new hypoattenuation of the body of caudate nucleus and posterior lentiform nucleus on the left. Prominence of the ventricles and sulci reflects generalized parenchymal volume. Confluent areas of hypoattenuation in the supratentorial white matter nonspecific but probably reflect advanced chronic microvascular ischemic changes. There is no extra-axial  fluid collection. Vascular: Hyperdensity of the left MCA. Skull: Small right burr holes. Sinuses/Orbits: No acute finding. Other: Mastoid air cells are clear. ASPECTS ([place] Stroke Program Early CT Score) - Ganglionic level infarction (caudate, lentiform nuclei, internal capsule, insula, M1-M3 cortex): 5 - Supraganglionic infarction (M4-M6 cortex): 3 Total score (0-10 with 10 being normal): 8 CTA NECK FINDINGS Aortic arch: Calcified and noncalcified plaque along the arch and patent great vessel origins. Right carotid system: Patent. Mild calcified plaque at the ICA origin causing minimal stenosis. Left carotid system: Patent. Trace calcified plaque at the ICA origin without measurable stenosis. Vertebral arteries: Patent. Left vertebral artery is slightly dominant. Minor calcified plaque is present. Skeleton: Multilevel degenerative changes of the cervical spine. Other neck: No mass or adenopathy. Upper chest: No apical lung mass. Review of the MIP images confirms the above findings CTA HEAD FINDINGS Anterior circulation: Intracranial internal carotid arteries are patent with trace calcified plaque. There is nearly occlusive thrombus within the proximal left M1 MCA with faint contrast enhancement more distally extending into a M2 branch. Diminished flow within distal left MCA branches. Right middle cerebral and both anterior cerebral arteries are patent. Posterior circulation: Intracranial vertebral, basilar, and posterior cerebral arteries are patent. There are bilateral posterior communicating arteries present. Venous sinuses: As permitted by contrast timing, patent. Review of the MIP images confirms the above findings CT Brain Perfusion Findings: CBF (<30%) Volume: 40mL Perfusion (Tmax>6.0s) volume: 54mL Mismatch Volume: 21mL Infarction Location: Left basal ganglia corresponding to noncontrast CT findings. Area of penumbra is within the left MCA territory. IMPRESSION: No acute intracranial hemorrhage. Left MCA  territory acute infarction involving the basal ganglia. ASPECT score of 8. There is nearly occlusive thrombus within the proximal left M1 MCA extending into an M2 branch. Perfusion imaging demonstrates core infarction of 4 mL (corresponds to  noncontrast CT finding) and penumbra of 83 mL in the left MCA territory. Initial results were called by telephone at the time of interpretation on 09/02/2019 at 11:43 am to provider Dr. Curly Shores, who verbally acknowledged these results. CTA/CTP results were communicated at 11:55 amon 8/14/2021by text page via the [facility] messaging system. Electronically Signed   By: Macy Mis M.D.   On: 09/02/2019 12:05   CT HEAD CODE STROKE WO CONTRAST  Result Date: 09/02/2019 CLINICAL DATA:  Code stroke EXAM: CT HEAD WITHOUT CONTRAST CT ANGIOGRAPHY HEAD AND NECK CT PERFUSION BRAIN TECHNIQUE: Multidetector CT imaging of the head and neck was performed using the standard protocol during bolus administration of intravenous contrast. Multiplanar CT image reconstructions and MIPs were obtained to evaluate the vascular anatomy. Carotid stenosis measurements (when applicable) are obtained utilizing NASCET criteria, using the distal internal carotid diameter as the denominator. Multiphase CT imaging of the brain was performed following IV bolus contrast injection. Subsequent parametric perfusion maps were calculated using RAPID software. CONTRAST:  128mL OMNIPAQUE IOHEXOL 350 MG/ML SOLN COMPARISON:  2020 head CT FINDINGS: Brain: There is no acute intracranial hemorrhage. Suspected new hypoattenuation of the body of caudate nucleus and posterior lentiform nucleus on the left. Prominence of the ventricles and sulci reflects generalized parenchymal volume. Confluent areas of hypoattenuation in the supratentorial white matter nonspecific but probably reflect advanced chronic microvascular ischemic changes. There is no extra-axial fluid collection. Vascular: Hyperdensity of the left MCA. Skull: Small  right burr holes. Sinuses/Orbits: No acute finding. Other: Mastoid air cells are clear. ASPECTS ([place] Stroke Program Early CT Score) - Ganglionic level infarction (caudate, lentiform nuclei, internal capsule, insula, M1-M3 cortex): 5 - Supraganglionic infarction (M4-M6 cortex): 3 Total score (0-10 with 10 being normal): 8 CTA NECK FINDINGS Aortic arch: Calcified and noncalcified plaque along the arch and patent great vessel origins. Right carotid system: Patent. Mild calcified plaque at the ICA origin causing minimal stenosis. Left carotid system: Patent. Trace calcified plaque at the ICA origin without measurable stenosis. Vertebral arteries: Patent. Left vertebral artery is slightly dominant. Minor calcified plaque is present. Skeleton: Multilevel degenerative changes of the cervical spine. Other neck: No mass or adenopathy. Upper chest: No apical lung mass. Review of the MIP images confirms the above findings CTA HEAD FINDINGS Anterior circulation: Intracranial internal carotid arteries are patent with trace calcified plaque. There is nearly occlusive thrombus within the proximal left M1 MCA with faint contrast enhancement more distally extending into a M2 branch. Diminished flow within distal left MCA branches. Right middle cerebral and both anterior cerebral arteries are patent. Posterior circulation: Intracranial vertebral, basilar, and posterior cerebral arteries are patent. There are bilateral posterior communicating arteries present. Venous sinuses: As permitted by contrast timing, patent. Review of the MIP images confirms the above findings CT Brain Perfusion Findings: CBF (<30%) Volume: 16mL Perfusion (Tmax>6.0s) volume: 60mL Mismatch Volume: 42mL Infarction Location: Left basal ganglia corresponding to noncontrast CT findings. Area of penumbra is within the left MCA territory. IMPRESSION: No acute intracranial hemorrhage. 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 ASPECT score of  8. There is nearly occlusive thrombus within the proximal left M1 MCA extending into an M2 branch. Perfusion imaging demonstrates core infarction of 4 mL (corresponds to noncontrast CT finding) and penumbra of 83 mL in the left MCA territory. Initial results were called by telephone at the time of interpretation on 09/02/2019 at 11:43 am to provider Dr. Curly Shores, who verbally acknowledged these results. CTA/CTP results were communicated at 11:55 amon  8/14/2021by text page via the [facility] messaging system. Electronically Signed   By: Macy Mis M.D.   On: 09/02/2019 12:05   CT ANGIO HEAD CODE STROKE  Result Date: 09/02/2019 CLINICAL DATA:  Code stroke EXAM: CT HEAD WITHOUT CONTRAST CT ANGIOGRAPHY HEAD AND NECK CT PERFUSION BRAIN TECHNIQUE: Multidetector CT imaging of the head and neck was performed using the standard protocol during bolus administration of intravenous contrast. Multiplanar CT image reconstructions and MIPs were obtained to evaluate the vascular anatomy. Carotid stenosis measurements (when applicable) are obtained utilizing NASCET criteria, using the distal internal carotid diameter as the denominator. Multiphase CT imaging of the brain was performed following IV bolus contrast injection. Subsequent parametric perfusion maps were calculated using RAPID software. CONTRAST:  139mL OMNIPAQUE IOHEXOL 350 MG/ML SOLN COMPARISON:  2020 head CT FINDINGS: Brain: There is no acute intracranial hemorrhage. Suspected new hypoattenuation of the body of caudate nucleus and posterior lentiform nucleus on the left. Prominence of the ventricles and sulci reflects generalized parenchymal volume. Confluent areas of hypoattenuation in the supratentorial white matter nonspecific but probably reflect advanced chronic microvascular ischemic changes. There is no extra-axial fluid collection. Vascular: Hyperdensity of the left MCA. Skull: Small right burr holes. Sinuses/Orbits: No acute finding. Other: Mastoid air cells  are clear. ASPECTS ([place] Stroke Program Early CT Score) - Ganglionic level infarction (caudate, lentiform nuclei, internal capsule, insula, M1-M3 cortex): 5 - Supraganglionic infarction (M4-M6 cortex): 3 Total score (0-10 with 10 being normal): 8 CTA NECK FINDINGS Aortic arch: Calcified and noncalcified plaque along the arch and patent great vessel origins. Right carotid system: Patent. Mild calcified plaque at the ICA origin causing minimal stenosis. Left carotid system: Patent. Trace calcified plaque at the ICA origin without measurable stenosis. Vertebral arteries: Patent. Left vertebral artery is slightly dominant. Minor calcified plaque is present. Skeleton: Multilevel degenerative changes of the cervical spine. Other neck: No mass or adenopathy. Upper chest: No apical lung mass. Review of the MIP images confirms the above findings CTA HEAD FINDINGS Anterior circulation: Intracranial internal carotid arteries are patent with trace calcified plaque. There is nearly occlusive thrombus within the proximal left M1 MCA with faint contrast enhancement more distally extending into a M2 branch. Diminished flow within distal left MCA branches. Right middle cerebral and both anterior cerebral arteries are patent. Posterior circulation: Intracranial vertebral, basilar, and posterior cerebral arteries are patent. There are bilateral posterior communicating arteries present. Venous sinuses: As permitted by contrast timing, patent. Review of the MIP images confirms the above findings CT Brain Perfusion Findings: CBF (<30%) Volume: 31mL Perfusion (Tmax>6.0s) volume: 74mL Mismatch Volume: 38mL Infarction Location: Left basal ganglia corresponding to noncontrast CT findings. Area of penumbra is within the left MCA territory. IMPRESSION: No acute intracranial hemorrhage. 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 ASPECT score of 8. There is nearly occlusive thrombus within the proximal left M1 MCA  extending into an M2 branch. Perfusion imaging demonstrates core infarction of 4 mL (corresponds to noncontrast CT finding) and penumbra of 83 mL in the left MCA territory. Initial results were called by telephone at the time of interpretation on 09/02/2019 at 11:43 am to provider Dr. Curly Shores, who verbally acknowledged these results. CTA/CTP results were communicated at 11:55 amon 8/14/2021by text page via the [facility] messaging system. Electronically Signed   By: Macy Mis M.D.   On: 09/02/2019 12:05   CT ANGIO NECK CODE STROKE  Result Date: 09/02/2019 CLINICAL DATA:  Code stroke EXAM: CT HEAD WITHOUT CONTRAST CT ANGIOGRAPHY HEAD  AND NECK CT PERFUSION BRAIN TECHNIQUE: Multidetector CT imaging of the head and neck was performed using the standard protocol during bolus administration of intravenous contrast. Multiplanar CT image reconstructions and MIPs were obtained to evaluate the vascular anatomy. Carotid stenosis measurements (when applicable) are obtained utilizing NASCET criteria, using the distal internal carotid diameter as the denominator. Multiphase CT imaging of the brain was performed following IV bolus contrast injection. Subsequent parametric perfusion maps were calculated using RAPID software. CONTRAST:  167mL OMNIPAQUE IOHEXOL 350 MG/ML SOLN COMPARISON:  2020 head CT FINDINGS: Brain: There is no acute intracranial hemorrhage. Suspected new hypoattenuation of the body of caudate nucleus and posterior lentiform nucleus on the left. Prominence of the ventricles and sulci reflects generalized parenchymal volume. Confluent areas of hypoattenuation in the supratentorial white matter nonspecific but probably reflect advanced chronic microvascular ischemic changes. There is no extra-axial fluid collection. Vascular: Hyperdensity of the left MCA. Skull: Small right burr holes. Sinuses/Orbits: No acute finding. Other: Mastoid air cells are clear. ASPECTS ([place] Stroke Program Early CT Score) - Ganglionic  level infarction (caudate, lentiform nuclei, internal capsule, insula, M1-M3 cortex): 5 - Supraganglionic infarction (M4-M6 cortex): 3 Total score (0-10 with 10 being normal): 8 CTA NECK FINDINGS Aortic arch: Calcified and noncalcified plaque along the arch and patent great vessel origins. Right carotid system: Patent. Mild calcified plaque at the ICA origin causing minimal stenosis. Left carotid system: Patent. Trace calcified plaque at the ICA origin without measurable stenosis. Vertebral arteries: Patent. Left vertebral artery is slightly dominant. Minor calcified plaque is present. Skeleton: Multilevel degenerative changes of the cervical spine. Other neck: No mass or adenopathy. Upper chest: No apical lung mass. Review of the MIP images confirms the above findings CTA HEAD FINDINGS Anterior circulation: Intracranial internal carotid arteries are patent with trace calcified plaque. There is nearly occlusive thrombus within the proximal left M1 MCA with faint contrast enhancement more distally extending into a M2 branch. Diminished flow within distal left MCA branches. Right middle cerebral and both anterior cerebral arteries are patent. Posterior circulation: Intracranial vertebral, basilar, and posterior cerebral arteries are patent. There are bilateral posterior communicating arteries present. Venous sinuses: As permitted by contrast timing, patent. Review of the MIP images confirms the above findings CT Brain Perfusion Findings: CBF (<30%) Volume: 26mL Perfusion (Tmax>6.0s) volume: 16mL Mismatch Volume: 44mL Infarction Location: Left basal ganglia corresponding to noncontrast CT findings. Area of penumbra is within the left MCA territory. IMPRESSION: No acute intracranial hemorrhage. 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 ASPECT score of 8. There is nearly occlusive thrombus within the proximal left M1 MCA extending into an M2 branch. Perfusion imaging demonstrates core infarction of  4 mL (corresponds to noncontrast CT finding) and penumbra of 83 mL in the left MCA territory. Initial results were called by telephone at the time of interpretation on 09/02/2019 at 11:43 am to provider Dr. Curly Shores, who verbally acknowledged these results. CTA/CTP results were communicated at 11:55 amon 8/14/2021by text page via the [facility] messaging system. Electronically Signed   By: Macy Mis M.D.   On: 09/02/2019 12:05    Procedures Procedures (including critical care time)  Medications Ordered in ED Medications  sodium chloride flush (NS) 0.9 % injection 3 mL (has no administration in time range)  LORazepam (ATIVAN) 2 MG/ML injection (has no administration in time range)  iohexol (OMNIPAQUE) 350 MG/ML injection 100 mL (100 mLs Intravenous Contrast Given 09/02/19 1145)    ED Course  I have reviewed the triage vital  signs and the nursing notes.  Pertinent labs & imaging results that were available during my care of the patient were reviewed by me and considered in my medical decision making (see chart for details).    MDM Rules/Calculators/A&P  Patient arrives here as a code stroke with altered mental status.  She was seen immediately upon arrival, then sent to radiology for imaging studies of her head.  This unfortunately revealed an acute left MCA territory stroke.  Patient to go to IR.  CRITICAL CARE Performed by: Veryl Speak Total critical care time: 35 minutes Critical care time was exclusive of separately billable procedures and treating other patients. Critical care was necessary to treat or prevent imminent or life-threatening deterioration. Critical care was time spent personally by me on the following activities: development of treatment plan with patient and/or surrogate as well as nursing, discussions with consultants, evaluation of patient's response to treatment, examination of patient, obtaining history from patient or surrogate, ordering and performing treatments and  interventions, ordering and review of laboratory studies, ordering and review of radiographic studies, pulse oximetry and re-evaluation of patient's condition.   Final Clinical Impression(s) / ED Diagnoses Final diagnoses:  Stroke [facility])    Rx / [place] Orders ED Discharge Orders    None       Veryl Speak, MD 09/02/19 1224

## 2019-09-02 NOTE — Procedures (Signed)
INTERVENTIONAL NEURORADIOLOGY BRIEF POSTPROCEDURE NOTE  DIAGNOSTIC CEREBRAL ANGIOGRAM AND MECHANICAL THROMBECTOMY  Attending: Dr. Erven Colla de Sindy Messing  Assistant: None.  Diagnosis: Left middle cerebral artery occlusion  Access site: RCFA  Access closure: Perclose proglide  Anesthesia: General.  Medication used: refer to anesthesia documentation.  Complications: Small left sylvian SAH.  Estimated blood loss: 150 mL.  Specimen: None.  Findings: Proximal left M1/MCA occlusion. 3 passes performed with aspiration + stent retriever (embotrap 47mm) with complete recanalization. No embolus to new or distal territory. Small right sylvian fissure SAH on post procedural flat panel CT.  The patient tolerated the procedure well without incident or complication and is in stable condition.   PLAN:  - Transfer to ICU - Maintain intubated as no covid-19 test result available - SBP 120-140 mmHg - Bed rest post femoral access x6 hours

## 2019-09-02 NOTE — Code Documentation (Signed)
Stroke Response Nurse Documentation Code Documentation  [name] is a 86 y.o. female arriving to [facility] ED via [facility] on 09/02/19 with past medical hx of former smoker, afib, HTN, prior hematoma . Code stroke was activated by [facility]. Patient from assisted living facility where she was LKW at 1430 09/01/19 and now complaining of left sided gaze and aphasia . On unknown meds. Stroke team at the bedside on patient arrival. Labs drawn and patient cleared for CT by ED Dr.. Patient to CT with team. NIHSS 22, see documentation for details and code stroke times. Patient with Global aphasia  on exam. The following imaging was completed: 1134 (CT, CTA head and neck, CTP). Patient is not a candidate for tPA due to time of last known well. Care/Plan. Bedside handoff with ED RN Claiborne Billings.  Patient is currently in IR.  [place]  Stroke Response RN
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
## 2019-09-02 NOTE — H&P (Addendum)
Neurology Consultation Reason for Consult: Right-sided weakness and aphasia Referring Physician: Veryl Speak  CC: N/A secondary to aphasia  History is obtained from: Family and chart review  HPI: [name] is a 86 y.o. female with a history of atrial fibrillation (not on anticoagulation secondary to prior subdural hematoma), hypertension, hyperlipidemia, heart disease, mild cognitive impairment of vascular etiology complicated by alcohol use.  She lives in independent living at [place] at [facility], and family last spoke to her at 2:30 PM on 8/13.  In the morning of 8/14 at 10:30 AM when they called her again, her speech was garbled and EMS was activated.  On arrival the patient had left-sided gaze deviation and nonrhythmic shaking of the left side.  EMS noted that blood pressures were in the 180s over 90s, glucose was 149.   LKW: 2:30 PM on 8/13 tPA given?: No, due to out of window IA performed?:  Yes, TICI3 reperfusion with small subdural hemorrhage  Premorbid modified rankin scale:      0 - No symptoms.  ROS: A 14 point ROS was performed and is negative except as noted in the HPI.  Unable to obtain due to altered mental status.   Past Medical History:  Diagnosis Date   Arthritis    Atrial fibrillation (HCC)    Cataract    BILATERAL   DDD (degenerative disc disease), cervical    Depression    Diverticulosis    Falls    GERD (gastroesophageal reflux disease)    Glaucoma    Headache    Hyperlipidemia    Hyperlipidemia    Hypertension    Osteoporosis    Peripheral neuropathy    Spondylosis    lumbar   Subdural hematoma (HCC)     Family History  Problem Relation Age of Onset   Heart disease Father    Ovarian cancer Mother        ?   Breast cancer Sister    Colon cancer Neg Hx    Diabetes Neg Hx    Liver disease Neg Hx    Kidney disease Neg Hx    Dementia Neg Hx     Social History:  reports that she has quit smoking. She has  never used smokeless tobacco. She reports current alcohol use of about 14.0 standard drinks of alcohol per week. She reports that she does not use drugs.   Exam: Current vital signs: BP 124/72    Pulse 77    Temp (!) 97.4 F (36.3 C) (Axillary)    Resp 16    Ht 5\' 5"  (1.651 m)    Wt 84.3 kg    SpO2 100%    BMI 30.93 kg/m  Vital signs in last 24 hours: Temp:  [96.3 F (35.7 C)-98.1 F (36.7 C)] 97.4 F (36.3 C) (08/14 1600) Pulse Rate:  [50-99] 77 (08/14 2030) Resp:  [12-26] 16 (08/14 2030) BP: (103-190)/(32-101) 124/72 (08/14 2030) SpO2:  [97 %-100 %] 100 % (08/14 2030) FiO2 (%):  [50 %] 50 % (08/14 2000) Weight:  [84.3 kg] 84.3 kg (08/14 1210)   Physical Exam  Constitutional: Appears well-developed and well-nourished.  Psych: Regards examiner, but interaction limited by severe aphasia Eyes: No scleral injection HENT: No OP obstruction MSK: no joint deformities.  Cardiovascular: Irregularly irregular, good radial pulses bilaterally Respiratory: Effort normal, non-labored breathing, tachypneic GI: Soft.  No distension. There is no tenderness.  Obese. Skin: Warm dry and intact  Neuro: Mental Status: Slightly sleepy, without any verbal  output.  Not following any commands. Cranial Nerves: II: Visual Fields are with a full right field cut. Pupils are equal, round, and reactive to light.  III,IV, VI: EOMI with a strong left gaze preference, can get to midline. V: Blink to eyelash brush symmetric VII: Facial movement is notable for a right facial droop VIII: hearing is intact to voice  Sensory/motor: Tone is low in the right upper extremity.  There is minimal movement of the right upper extremity with noxious stim, and the right lower extremity is not antigravity, but does move.  Patient is not as quickly responsive to stimulation on the right.  Patient is easily antigravity on the left arm and leg  Deep Tendon Reflexes: 2+ and symmetric in the biceps and patellae.   Plantars: Toes are downgoing bilaterally.  Cerebellar: Unable to assess secondary to patient's mental status    I have reviewed labs in epic and the results pertinent to this consultation are: Creatinine of 0.85 Glucose of 136  I have reviewed the images obtained: Head CT with dense left MCA sign CTA with proximal M1 cutoff CTP with large area risk and minimal core infarct  Impression: [name] is a 86 y.o. woman with a past medical history significant for atrial fibrillation not on anticoagulation, hypertension, hyperlipidemia, mild cognitive impairment, substantial alcohol use, presenting with a left MCA stroke likely cardioembolic secondary to her atrial fibrillation.  Recommendations:  #Left MCA cardioembolic stroke - MAUQ3F, fasting lipid panel - MRI, MRA  of the brain without contrast - Echocardiogram - Carotid dopplers - Hold aspirin until subdural hemorrhage confirmed stable - Risk factor modification - Telemetry monitoring while in ICU, can be rapidly discontinued given known diagnosis of atrial fibrillation - PT consult, OT consult, Speech consult - Stroke team to follow  # Atrial fibrillation not on home anticoagulation Will rediscuss risks and benefits with family given history of subdural hemorrhage.  Safe potential start date for anticoagulation will depend on the size of the final infarct and any hemorrhagic conversion. -We will resume home metoprolol at half dose (25 twice daily instead of 50 twice daily) to prevent rebound tachycardia  # Hypertension Given TICI3 reperfusion and subdural hemorrhage, will have a systolic blood pressure goal 120-140 (with low blood pressures being managed preferentially with IV fluids and not pressors) -Cleviprex drip -Holding home routine presents  # Mood disorder and mild cognitive impairment -Continue sertraline 50 mg daily -Continue donepezil 10 mg nightly  # Insomnia -Continue home dose of 30 mg melatonin  nightly -Holding reported home Benadryl 75 mg nightly  # Intubation for procedure -Appreciate CCM management of the ventilator  # Alcohol use - CIWAS protocol (monitoring only, medications not ordered to avoid clotting neurological examination)  Lesleigh Noe MD-PhD [facility] [phone]  Addended for charge capture

## 2019-09-02 NOTE — ED Notes (Signed)
Patient transported to IR with RR nurse.

## 2019-09-03 ENCOUNTER — Inpatient Hospital Stay (HOSPITAL_COMMUNITY): Payer: Medicare Other

## 2019-09-03 DIAGNOSIS — I350 Nonrheumatic aortic (valve) stenosis: Secondary | ICD-10-CM

## 2019-09-03 DIAGNOSIS — Z9911 Dependence on respirator [ventilator] status: Secondary | ICD-10-CM | POA: Diagnosis not present

## 2019-09-03 DIAGNOSIS — J9600 Acute respiratory failure, unspecified whether with hypoxia or hypercapnia: Secondary | ICD-10-CM | POA: Diagnosis not present

## 2019-09-03 LAB — CBC
HCT: 40.4 % (ref 36.0–46.0)
Hemoglobin: 13.3 g/dL (ref 12.0–15.0)
MCH: 32 pg (ref 26.0–34.0)
MCHC: 32.9 g/dL (ref 30.0–36.0)
MCV: 97.1 fL (ref 80.0–100.0)
Platelets: 225 10*3/uL (ref 150–400)
RBC: 4.16 MIL/uL (ref 3.87–5.11)
RDW: 12.9 % (ref 11.5–15.5)
WBC: 11.8 10*3/uL — ABNORMAL HIGH (ref 4.0–10.5)
nRBC: 0 % (ref 0.0–0.2)

## 2019-09-03 LAB — ECHOCARDIOGRAM COMPLETE
AR max vel: 2.07 cm2
AV Area VTI: 1.93 cm2
AV Area mean vel: 1.7 cm2
AV Mean grad: 9 mmHg
AV Peak grad: 14 mmHg
Ao pk vel: 1.87 m/s
Area-P 1/2: 4.39 cm2
Height: 65 in
S' Lateral: 2.4 cm
Weight: 2973.56 oz

## 2019-09-03 LAB — BASIC METABOLIC PANEL
Anion gap: 11 (ref 5–15)
BUN: 16 mg/dL (ref 8–23)
CO2: 21 mmol/L — ABNORMAL LOW (ref 22–32)
Calcium: 9.2 mg/dL (ref 8.9–10.3)
Chloride: 106 mmol/L (ref 98–111)
Creatinine, Ser: 0.85 mg/dL (ref 0.44–1.00)
GFR calc Af Amer: >60 mL/min (ref >60)
GFR calc non Af Amer: >60 mL/min (ref >60)
Glucose, Bld: 136 mg/dL — ABNORMAL HIGH (ref 70–99)
Potassium: 4.2 mmol/L (ref 3.5–5.1)
Sodium: 138 mmol/L (ref 135–145)

## 2019-09-03 LAB — LIPID PANEL
Cholesterol: 206 mg/dL — ABNORMAL HIGH (ref 0–200)
HDL: 41 mg/dL (ref >40)
LDL Cholesterol: 119 mg/dL — ABNORMAL HIGH (ref 0–99)
Total CHOL/HDL Ratio: 5 RATIO
Triglycerides: 230 mg/dL — ABNORMAL HIGH (ref <150)
VLDL: 46 mg/dL — ABNORMAL HIGH (ref 0–40)

## 2019-09-03 LAB — HEMOGLOBIN A1C
Hgb A1c MFr Bld: 5.8 % — ABNORMAL HIGH (ref 4.8–5.6)
Mean Plasma Glucose: 119.76 mg/dL

## 2019-09-03 MED ORDER — AMLODIPINE BESYLATE 10 MG PO TABS
10.0000 mg | ORAL_TABLET | Freq: Every day | ORAL | Status: DC
  Administered 2019-09-03 – 2019-09-07 (×4): 10 mg
  Filled 2019-09-03 (×4): qty 1

## 2019-09-03 MED ORDER — METOPROLOL TARTRATE 5 MG/5ML IV SOLN
2.5000 mg | Freq: Four times a day (QID) | INTRAVENOUS | Status: DC
  Administered 2019-09-03 (×2): 2.5 mg via INTRAVENOUS
  Filled 2019-09-03 (×2): qty 5

## 2019-09-03 MED ORDER — PANTOPRAZOLE SODIUM 40 MG PO PACK
40.0000 mg | PACK | Freq: Every day | ORAL | Status: DC
  Administered 2019-09-03 – 2019-09-06 (×4): 40 mg
  Filled 2019-09-03 (×5): qty 20

## 2019-09-03 MED ORDER — METOPROLOL TARTRATE 25 MG/10 ML ORAL SUSPENSION
50.0000 mg | Freq: Two times a day (BID) | ORAL | Status: DC
  Administered 2019-09-03 – 2019-09-07 (×7): 50 mg
  Filled 2019-09-03 (×8): qty 20

## 2019-09-03 MED ORDER — VITAMIN B-12 1000 MCG PO TABS
3000.0000 ug | ORAL_TABLET | Freq: Every day | ORAL | Status: DC
  Administered 2019-09-03 – 2019-09-07 (×4): 3000 ug
  Filled 2019-09-03 (×4): qty 3

## 2019-09-03 MED ORDER — FOLIC ACID 1 MG PO TABS
1.0000 mg | ORAL_TABLET | Freq: Every day | ORAL | Status: DC
  Administered 2019-09-03: 1 mg
  Filled 2019-09-03: qty 1

## 2019-09-03 MED ORDER — ATORVASTATIN CALCIUM 40 MG PO TABS
40.0000 mg | ORAL_TABLET | Freq: Every day | ORAL | Status: DC
  Administered 2019-09-03 – 2019-09-07 (×4): 40 mg
  Filled 2019-09-03 (×4): qty 1

## 2019-09-03 MED ORDER — SERTRALINE HCL 50 MG PO TABS
50.0000 mg | ORAL_TABLET | Freq: Every day | ORAL | Status: DC
  Administered 2019-09-03 – 2019-09-07 (×4): 50 mg
  Filled 2019-09-03 (×4): qty 1

## 2019-09-03 MED ORDER — ADULT MULTIVITAMIN LIQUID CH
15.0000 mL | Freq: Every day | ORAL | Status: DC
  Administered 2019-09-05 – 2019-09-07 (×3): 15 mL
  Filled 2019-09-03 (×5): qty 15

## 2019-09-03 MED ORDER — DONEPEZIL HCL 10 MG PO TABS
10.0000 mg | ORAL_TABLET | Freq: Every day | ORAL | Status: DC
  Administered 2019-09-03 – 2019-09-06 (×4): 10 mg
  Filled 2019-09-03 (×4): qty 1

## 2019-09-03 MED ORDER — LOSARTAN POTASSIUM 25 MG PO TABS
25.0000 mg | ORAL_TABLET | Freq: Every day | ORAL | Status: DC
  Administered 2019-09-03 – 2019-09-07 (×4): 25 mg
  Filled 2019-09-03 (×4): qty 1

## 2019-09-03 NOTE — Progress Notes (Signed)
°  Echocardiogram 2D Echocardiogram has been performed.  [name] 09/03/2019, 10:41 AM

## 2019-09-03 NOTE — Progress Notes (Signed)
Referring Physician(s): Code stroke- [employer], [place]  Supervising Physician: Pedro Earls  Patient Status:  [facility] - In-pt  Chief Complaint: None- intubated without sedation  Subjective:  CVA s/p cerebral arteriogram with emergent mechanical thrombectomy of left MCA M1 occlusion via right femoral approach 09/02/2019 by Dr. Karenann Cai. Patient laying in bed intubated without sedation. She opens eyes to voice and intermittently follows simple commands. Accompanied by two daughters at bedside. Can spontaneously move left side with no spontaneous movements of right side. Right femoral puncture site c/d/i.   Allergies: Codeine  Medications: Prior to Admission medications   Medication Sig Start Date End Date Taking? Authorizing Provider  acetaminophen (TYLENOL) 650 MG CR tablet Take 650 mg by mouth 2 (two) times daily.    Yes [provider]  amLODipine (NORVASC) 10 MG tablet Take 10 mg by mouth daily.  04/23/17  Yes [provider]  APPLE CIDER VINEGAR PO Take 1,200 mg by mouth daily.   Yes [provider]  Ascorbic Acid (VITAMIN C) 1000 MG tablet Take 1,000 mg by mouth daily.   Yes [provider]  Biotin 5000 MCG TABS Take 5,000 mcg by mouth daily.   Yes [provider]  Cholecalciferol (VITAMIN D3 SUPER STRENGTH) 50 MCG (2000 UT) TABS Take 2,000 Units by mouth daily.   Yes [provider]  clobetasol (TEMOVATE) 0.05 % external solution Apply 1 application topically daily as needed (scalp itching).   Yes [provider]  diphenhydrAMINE HCl, Sleep, (ZZZQUIL PO) Take 3 capsules by mouth at bedtime.   Yes [provider]  donepezil (ARICEPT) 10 MG tablet Take 10 mg by mouth at bedtime.   Yes [provider]  latanoprost (XALATAN) 0.005 % ophthalmic solution Place 1 drop into both eyes at bedtime.   Yes [provider]  loratadine (CLARITIN) 10 MG tablet Take 10 mg by mouth  daily as needed for itching.    Yes [provider]  losartan (COZAAR) 25 MG tablet Take 25 mg by mouth daily.   Yes [provider]  Melatonin 10 MG TABS Take 30 mg by mouth at bedtime.    Yes [provider]  meloxicam (MOBIC) 15 MG tablet Take 15 mg by mouth daily as needed for pain.    Yes [provider]  metoprolol tartrate (LOPRESSOR) 50 MG tablet Take 50 mg by mouth 2 (two) times daily.    Yes [provider]  omeprazole (PRILOSEC) 40 MG capsule Take 40 mg by mouth daily.  09/08/18  Yes [provider]  OVER THE COUNTER MEDICATION Take 2 tablets by mouth daily. "Sugar Bear Hair"   Yes [provider]  Psyllium (METAMUCIL PO) Take 1 Dose by mouth daily as needed (constipation).   Yes [provider]  Sennosides (EX-LAX PO) Take 1 tablet by mouth daily as needed (constipation).   Yes [provider]  sertraline (ZOLOFT) 50 MG tablet Take 50 mg by mouth daily.  09/08/18  Yes [provider]  Simethicone (GAS-X PO) Take 1 tablet by mouth daily as needed (gas/bloating/flatulence).   Yes [provider]  simvastatin (ZOCOR) 20 MG tablet Take 20 mg by mouth at bedtime.    Yes [provider]  timolol (TIMOPTIC) 0.5 % ophthalmic solution Place 1 drop into both eyes daily.   Yes [provider]  triamcinolone lotion (KENALOG) 0.1 % Apply 1 application topically daily as needed (itching).   Yes [provider]  TURMERIC PO Take 200 mg by mouth daily.   Yes [provider]  vitamin B-12 (CYANOCOBALAMIN) 1000 MCG tablet Take 3,000 mcg by mouth daily.   Yes [provider]     Vital Signs: BP (!) 145/79 (BP Location: Left Arm) Comment: cleviprex titrated  Pulse 81   Temp 99.6 F (37.6 C) (Axillary)   Resp 15   Ht 5\' 5"  (1.651 m)   Wt 185 lb 13.6 oz (84.3 kg)   SpO2 100%   BMI 30.93 kg/m   Physical Exam Vitals and nursing note reviewed.    Constitutional:      General: She is not in acute distress.    Comments: Intubated without sedation.  Pulmonary:     Effort: Pulmonary effort is normal. No respiratory distress.     Comments: Intubated without sedation. Skin:    General: Skin is warm and dry.     Comments: Right femoral puncture site soft without active bleeding or hematoma.  Neurological:     Comments: Intubated without sedation. She opens eyes to voice and intermittently follows simple commands (with EOMs, not with extremities). PERRL bilaterally. Left gaze preference able to pass midline on command. Can spontaneously move left side with no spontaneous movements of right side. Distal pulses (DPs) 1+ bilaterally.     Imaging: CT CEREBRAL PERFUSION W CONTRAST  Result Date: 09/02/2019 CLINICAL DATA:  Code stroke EXAM: CT HEAD WITHOUT CONTRAST CT ANGIOGRAPHY HEAD AND NECK CT PERFUSION BRAIN TECHNIQUE: Multidetector CT imaging of the head and neck was performed using the standard protocol during bolus administration of intravenous contrast. Multiplanar CT image reconstructions and MIPs were obtained to evaluate the vascular anatomy. Carotid stenosis measurements (when applicable) are obtained utilizing NASCET criteria, using the distal internal carotid diameter as the denominator. Multiphase CT imaging of the brain was performed following IV bolus contrast injection. Subsequent parametric perfusion maps were calculated using RAPID software. CONTRAST:  157mL OMNIPAQUE IOHEXOL 350 MG/ML SOLN COMPARISON:  2020 head CT FINDINGS: Brain: There is no acute intracranial hemorrhage. Suspected new hypoattenuation of the body of caudate nucleus and posterior lentiform nucleus on the left. Prominence of the ventricles and sulci reflects generalized parenchymal volume. Confluent areas of hypoattenuation in the supratentorial white matter nonspecific but probably reflect advanced chronic microvascular ischemic changes. There is no extra-axial  fluid collection. Vascular: Hyperdensity of the left MCA. Skull: Small right burr holes. Sinuses/Orbits: No acute finding. Other: Mastoid air cells are clear. ASPECTS ([place] Stroke Program Early CT Score) - Ganglionic level infarction (caudate, lentiform nuclei, internal capsule, insula, M1-M3 cortex): 5 - Supraganglionic infarction (M4-M6 cortex): 3 Total score (0-10 with 10 being normal): 8 CTA NECK FINDINGS Aortic arch: Calcified and noncalcified plaque along the arch and patent great vessel origins. Right carotid system: Patent. Mild calcified plaque at the ICA origin causing minimal stenosis. Left carotid system: Patent. Trace calcified plaque at the ICA origin without measurable stenosis. Vertebral arteries: Patent. Left vertebral artery is slightly dominant. Minor calcified plaque is present. Skeleton: Multilevel degenerative changes of the cervical spine. Other neck: No mass or adenopathy. Upper chest: No apical lung mass. Review of the MIP images confirms the above findings CTA HEAD FINDINGS Anterior circulation: Intracranial internal carotid arteries are patent with trace calcified plaque. There is nearly occlusive thrombus within the proximal left M1 MCA with faint contrast enhancement more distally extending into a M2 branch. Diminished flow within distal left MCA branches. Right middle cerebral and both anterior cerebral arteries are patent. Posterior circulation: Intracranial  vertebral, basilar, and posterior cerebral arteries are patent. There are bilateral posterior communicating arteries present. Venous sinuses: As permitted by contrast timing, patent. Review of the MIP images confirms the above findings CT Brain Perfusion Findings: CBF (<30%) Volume: 20mL Perfusion (Tmax>6.0s) volume: 63mL Mismatch Volume: 39mL Infarction Location: Left basal ganglia corresponding to noncontrast CT findings. Area of penumbra is within the left MCA territory. IMPRESSION: No acute intracranial hemorrhage. Left MCA  territory acute infarction involving the basal ganglia. ASPECT score of 8. There is nearly occlusive thrombus within the proximal left M1 MCA extending into an M2 branch. Perfusion imaging demonstrates core infarction of 4 mL (corresponds to noncontrast CT finding) and penumbra of 83 mL in the left MCA territory. Initial results were called by telephone at the time of interpretation on 09/02/2019 at 11:43 am to provider Dr. Curly Shores, who verbally acknowledged these results. CTA/CTP results were communicated at 11:55 amon 8/14/2021by text page via the [facility] messaging system. Electronically Signed   By: Macy Mis M.D.   On: 09/02/2019 12:05   CT HEAD CODE STROKE WO CONTRAST  Result Date: 09/02/2019 CLINICAL DATA:  Code stroke EXAM: CT HEAD WITHOUT CONTRAST CT ANGIOGRAPHY HEAD AND NECK CT PERFUSION BRAIN TECHNIQUE: Multidetector CT imaging of the head and neck was performed using the standard protocol during bolus administration of intravenous contrast. Multiplanar CT image reconstructions and MIPs were obtained to evaluate the vascular anatomy. Carotid stenosis measurements (when applicable) are obtained utilizing NASCET criteria, using the distal internal carotid diameter as the denominator. Multiphase CT imaging of the brain was performed following IV bolus contrast injection. Subsequent parametric perfusion maps were calculated using RAPID software. CONTRAST:  145mL OMNIPAQUE IOHEXOL 350 MG/ML SOLN COMPARISON:  2020 head CT FINDINGS: Brain: There is no acute intracranial hemorrhage. Suspected new hypoattenuation of the body of caudate nucleus and posterior lentiform nucleus on the left. Prominence of the ventricles and sulci reflects generalized parenchymal volume. Confluent areas of hypoattenuation in the supratentorial white matter nonspecific but probably reflect advanced chronic microvascular ischemic changes. There is no extra-axial fluid collection. Vascular: Hyperdensity of the left MCA. Skull: Small  right burr holes. Sinuses/Orbits: No acute finding. Other: Mastoid air cells are clear. ASPECTS ([place] Stroke Program Early CT Score) - Ganglionic level infarction (caudate, lentiform nuclei, internal capsule, insula, M1-M3 cortex): 5 - Supraganglionic infarction (M4-M6 cortex): 3 Total score (0-10 with 10 being normal): 8 CTA NECK FINDINGS Aortic arch: Calcified and noncalcified plaque along the arch and patent great vessel origins. Right carotid system: Patent. Mild calcified plaque at the ICA origin causing minimal stenosis. Left carotid system: Patent. Trace calcified plaque at the ICA origin without measurable stenosis. Vertebral arteries: Patent. Left vertebral artery is slightly dominant. Minor calcified plaque is present. Skeleton: Multilevel degenerative changes of the cervical spine. Other neck: No mass or adenopathy. Upper chest: No apical lung mass. Review of the MIP images confirms the above findings CTA HEAD FINDINGS Anterior circulation: Intracranial internal carotid arteries are patent with trace calcified plaque. There is nearly occlusive thrombus within the proximal left M1 MCA with faint contrast enhancement more distally extending into a M2 branch. Diminished flow within distal left MCA branches. Right middle cerebral and both anterior cerebral arteries are patent. Posterior circulation: Intracranial vertebral, basilar, and posterior cerebral arteries are patent. There are bilateral posterior communicating arteries present. Venous sinuses: As permitted by contrast timing, patent. Review of the MIP images confirms the above findings CT Brain Perfusion Findings: CBF (<30%) Volume: 30mL Perfusion (Tmax>6.0s) volume: 70mL Mismatch  Volume: 22mL Infarction Location: Left basal ganglia corresponding to noncontrast CT findings. Area of penumbra is within the left MCA territory. IMPRESSION: No acute intracranial hemorrhage. 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 ASPECT score of  8. There is nearly occlusive thrombus within the proximal left M1 MCA extending into an M2 branch. Perfusion imaging demonstrates core infarction of 4 mL (corresponds to noncontrast CT finding) and penumbra of 83 mL in the left MCA territory. Initial results were called by telephone at the time of interpretation on 09/02/2019 at 11:43 am to provider Dr. Curly Shores, who verbally acknowledged these results. CTA/CTP results were communicated at 11:55 amon 8/14/2021by text page via the [facility] messaging system. Electronically Signed   By: Macy Mis M.D.   On: 09/02/2019 12:05   CT ANGIO HEAD CODE STROKE  Result Date: 09/02/2019 CLINICAL DATA:  Code stroke EXAM: CT HEAD WITHOUT CONTRAST CT ANGIOGRAPHY HEAD AND NECK CT PERFUSION BRAIN TECHNIQUE: Multidetector CT imaging of the head and neck was performed using the standard protocol during bolus administration of intravenous contrast. Multiplanar CT image reconstructions and MIPs were obtained to evaluate the vascular anatomy. Carotid stenosis measurements (when applicable) are obtained utilizing NASCET criteria, using the distal internal carotid diameter as the denominator. Multiphase CT imaging of the brain was performed following IV bolus contrast injection. Subsequent parametric perfusion maps were calculated using RAPID software. CONTRAST:  120mL OMNIPAQUE IOHEXOL 350 MG/ML SOLN COMPARISON:  2020 head CT FINDINGS: Brain: There is no acute intracranial hemorrhage. Suspected new hypoattenuation of the body of caudate nucleus and posterior lentiform nucleus on the left. Prominence of the ventricles and sulci reflects generalized parenchymal volume. Confluent areas of hypoattenuation in the supratentorial white matter nonspecific but probably reflect advanced chronic microvascular ischemic changes. There is no extra-axial fluid collection. Vascular: Hyperdensity of the left MCA. Skull: Small right burr holes. Sinuses/Orbits: No acute finding. Other: Mastoid air cells  are clear. ASPECTS ([place] Stroke Program Early CT Score) - Ganglionic level infarction (caudate, lentiform nuclei, internal capsule, insula, M1-M3 cortex): 5 - Supraganglionic infarction (M4-M6 cortex): 3 Total score (0-10 with 10 being normal): 8 CTA NECK FINDINGS Aortic arch: Calcified and noncalcified plaque along the arch and patent great vessel origins. Right carotid system: Patent. Mild calcified plaque at the ICA origin causing minimal stenosis. Left carotid system: Patent. Trace calcified plaque at the ICA origin without measurable stenosis. Vertebral arteries: Patent. Left vertebral artery is slightly dominant. Minor calcified plaque is present. Skeleton: Multilevel degenerative changes of the cervical spine. Other neck: No mass or adenopathy. Upper chest: No apical lung mass. Review of the MIP images confirms the above findings CTA HEAD FINDINGS Anterior circulation: Intracranial internal carotid arteries are patent with trace calcified plaque. There is nearly occlusive thrombus within the proximal left M1 MCA with faint contrast enhancement more distally extending into a M2 branch. Diminished flow within distal left MCA branches. Right middle cerebral and both anterior cerebral arteries are patent. Posterior circulation: Intracranial vertebral, basilar, and posterior cerebral arteries are patent. There are bilateral posterior communicating arteries present. Venous sinuses: As permitted by contrast timing, patent. Review of the MIP images confirms the above findings CT Brain Perfusion Findings: CBF (<30%) Volume: 18mL Perfusion (Tmax>6.0s) volume: 62mL Mismatch Volume: 53mL Infarction Location: Left basal ganglia corresponding to noncontrast CT findings. Area of penumbra is within the left MCA territory. IMPRESSION: No acute intracranial hemorrhage. 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 ASPECT score of 8. There is nearly occlusive thrombus within the  proximal left M1 MCA  extending into an M2 branch. Perfusion imaging demonstrates core infarction of 4 mL (corresponds to noncontrast CT finding) and penumbra of 83 mL in the left MCA territory. Initial results were called by telephone at the time of interpretation on 09/02/2019 at 11:43 am to provider Dr. Curly Shores, who verbally acknowledged these results. CTA/CTP results were communicated at 11:55 amon 8/14/2021by text page via the [facility] messaging system. Electronically Signed   By: Macy Mis M.D.   On: 09/02/2019 12:05   CT ANGIO NECK CODE STROKE  Result Date: 09/02/2019 CLINICAL DATA:  Code stroke EXAM: CT HEAD WITHOUT CONTRAST CT ANGIOGRAPHY HEAD AND NECK CT PERFUSION BRAIN TECHNIQUE: Multidetector CT imaging of the head and neck was performed using the standard protocol during bolus administration of intravenous contrast. Multiplanar CT image reconstructions and MIPs were obtained to evaluate the vascular anatomy. Carotid stenosis measurements (when applicable) are obtained utilizing NASCET criteria, using the distal internal carotid diameter as the denominator. Multiphase CT imaging of the brain was performed following IV bolus contrast injection. Subsequent parametric perfusion maps were calculated using RAPID software. CONTRAST:  162mL OMNIPAQUE IOHEXOL 350 MG/ML SOLN COMPARISON:  2020 head CT FINDINGS: Brain: There is no acute intracranial hemorrhage. Suspected new hypoattenuation of the body of caudate nucleus and posterior lentiform nucleus on the left. Prominence of the ventricles and sulci reflects generalized parenchymal volume. Confluent areas of hypoattenuation in the supratentorial white matter nonspecific but probably reflect advanced chronic microvascular ischemic changes. There is no extra-axial fluid collection. Vascular: Hyperdensity of the left MCA. Skull: Small right burr holes. Sinuses/Orbits: No acute finding. Other: Mastoid air cells are clear. ASPECTS ([place] Stroke Program Early CT Score) - Ganglionic  level infarction (caudate, lentiform nuclei, internal capsule, insula, M1-M3 cortex): 5 - Supraganglionic infarction (M4-M6 cortex): 3 Total score (0-10 with 10 being normal): 8 CTA NECK FINDINGS Aortic arch: Calcified and noncalcified plaque along the arch and patent great vessel origins. Right carotid system: Patent. Mild calcified plaque at the ICA origin causing minimal stenosis. Left carotid system: Patent. Trace calcified plaque at the ICA origin without measurable stenosis. Vertebral arteries: Patent. Left vertebral artery is slightly dominant. Minor calcified plaque is present. Skeleton: Multilevel degenerative changes of the cervical spine. Other neck: No mass or adenopathy. Upper chest: No apical lung mass. Review of the MIP images confirms the above findings CTA HEAD FINDINGS Anterior circulation: Intracranial internal carotid arteries are patent with trace calcified plaque. There is nearly occlusive thrombus within the proximal left M1 MCA with faint contrast enhancement more distally extending into a M2 branch. Diminished flow within distal left MCA branches. Right middle cerebral and both anterior cerebral arteries are patent. Posterior circulation: Intracranial vertebral, basilar, and posterior cerebral arteries are patent. There are bilateral posterior communicating arteries present. Venous sinuses: As permitted by contrast timing, patent. Review of the MIP images confirms the above findings CT Brain Perfusion Findings: CBF (<30%) Volume: 80mL Perfusion (Tmax>6.0s) volume: 63mL Mismatch Volume: 59mL Infarction Location: Left basal ganglia corresponding to noncontrast CT findings. Area of penumbra is within the left MCA territory. IMPRESSION: No acute intracranial hemorrhage. 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 ASPECT score of 8. There is nearly occlusive thrombus within the proximal left M1 MCA extending into an M2 branch. Perfusion imaging demonstrates core infarction of  4 mL (corresponds to noncontrast CT finding) and penumbra of 83 mL in the left MCA territory. Initial results were called by telephone at the time of interpretation on 09/02/2019  at 11:43 am to provider Dr. Curly Shores, who verbally acknowledged these results. CTA/CTP results were communicated at 11:55 amon 8/14/2021by text page via the [facility] messaging system. Electronically Signed   By: Macy Mis M.D.   On: 09/02/2019 12:05    Labs:  CBC: Recent Labs    09/02/19 1130 09/02/19 1134 09/03/19 0319  WBC 8.4  --  11.8*  HGB 14.9 15.6* 13.3  HCT 47.0* 46.0 40.4  PLT 232  --  225    COAGS: Recent Labs    09/02/19 1130  INR 1.0  APTT 34    BMP: Recent Labs    09/02/19 1130 09/02/19 1134 09/03/19 0319  NA 134* 136 138  K 4.6 4.3 4.2  CL 104 104 106  CO2 18*  --  21*  GLUCOSE 136* 138* 136*  BUN 22 27* 16  CALCIUM 9.5  --  9.2  CREATININE 0.85 0.80 0.85  GFRNONAA >60  --  >60  GFRAA >60  --  >60    LIVER FUNCTION TESTS: Recent Labs    09/02/19 1130  BILITOT 1.0  AST 25  ALT 15  ALKPHOS 63  PROT 7.2  ALBUMIN 4.2    Assessment and Plan:  CVA s/p cerebral arteriogram with emergent mechanical thrombectomy of left MCA M1 occlusion via right femoral approach 09/02/2019 by Dr. Karenann Cai. Patient's condition stable- remains intubated (weaning) without sedation, intermittently follows simple commands, can spontaneously move left side with no spontaneous movements of right side. Right femoral puncture site stable, distal pulses (DPs) 1+ bilaterally. For MR brain and possible extubation today. Plan to follow-up with Dr. Karenann Cai in clinic 1 month after discharge (NIR schedulers to call patient to set up this appointment). Further plans per neurology/CCM- appreciate and agree with management. NIR to follow.   Electronically Signed: Earley Abide, PA-C 09/03/2019, 9:33 AM   I spent a total of 25 Minutes at the the patient's bedside AND on the  patient's hospital floor or unit, greater than 50% of which was counseling/coordinating care for CVA s/p revascularization.

## 2019-09-03 NOTE — Progress Notes (Signed)
OT Cancellation Note  Patient Details Name: [name] MRN: [id number] DOB: 22-May-1932   Cancelled Treatment:    Reason Eval/Treat Not Completed: (P) Active bedrest order (on vent)  Doyce Saling,HILLARY 09/03/2019, 8:12 AM  [name], OT/L   Acute OT Clinical Specialist Acute Rehabilitation Services Pager [phone] Office [phone]

## 2019-09-03 NOTE — Plan of Care (Signed)
  Problem: Ischemic Stroke/TIA Tissue Perfusion: Goal: Complications of ischemic stroke/TIA will be minimized Outcome: Progressing   

## 2019-09-03 NOTE — Progress Notes (Signed)
NAME:  [name], MRN:  [id number], DOB:  03/30/1932, LOS: 1 ADMISSION DATE:  09/02/2019, CONSULTATION DATE:  09/02/2019 REFERRING MD:  Curly [place], CHIEF COMPLAINT:  Intubation for airway protection   Brief History   This is an 86 year old female with past medical history of A. fib not on anticoagulation due to frequent falling and prior subdural hematoma, hypertension,memory loss, GERD who was admitted to the hospital for altered mental status,found to have a left MCA stroke and is now status post thrombectomy. PCCM consulted for post-intubation for airway protection and vent management.  Past Medical History  Atrial fibrillation not on anticoagulation due to prior subdural hematoma Hypertension  Hyperlipidemia Memory loss GERD  Consults:  PCCM Neurology stroke  Procedures:  8/14 ETT 8/14 left MCA thrombectomy  Significant Diagnostic Tests:  8/14 CTA head: No acute intracranial hemorrhage. 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 ASPECT score of 8. There is nearly occlusive thrombus within the proximal left M1 MCA extending into an M2 branch. Perfusion imaging demonstrates core infarction of 4 mL (corresponds to noncontrast CT finding) and penumbra of 83 mL in the left MCA territory  Antimicrobials:  none   Interim history/subjective:  Patient still lethargic despite min-to sedation. Does follow some simple commands  Objective   Blood pressure (!) 142/84, pulse 79, temperature 99.6 F (37.6 C), temperature source Axillary, resp. rate 18, height 5\' 5"  (1.651 m), weight 84.3 kg, SpO2 100 %.    Vent Mode: CPAP;PSV FiO2 (%):  [40 %-50 %] 40 % Set Rate:  [12 bmp] 12 bmp Vt Set:  [500 mL] 500 mL PEEP:  [5 cmH20] 5 cmH20 Pressure Support:  [5 cmH20] 5 cmH20 Plateau Pressure:  [15 cmH20-18 cmH20] 16 cmH20   Intake/Output Summary (Last 24 hours) at 09/03/2019 1130 Last data filed at 09/03/2019 1000 Gross per 24 hour  Intake 1167.86 ml  Output 550  ml  Net 617.86 ml   Filed Weights   09/02/19 1210  Weight: 84.3 kg    Examination: General: WD/WN NARD on PSV 10/5 HENT: [place]/AT ETT in place Lungs: Clear bilaterally Cardiovascular: irreg. No m/r/g Abdomen: Supple, no guarding, +BS Extremities: No c/c/e Neuro: Lethargic despite min-to sedation. Does follow some simple commands GU: Foley  Assessment & Plan:  Left MCA stroke status post thrombectomy -Intubated for airway protection. MS still not adequate to clear airway if extubated -Will reassess later today for possible extubation. Hold all propofol; may give Versed 2 mg x 1 at night to facilitate sleep if necessary so as to see if MS more alert to extubate.  HTN D/C Cleviprex; resume amlodipine, lopressor and Losartan for BP control.  Best practice:  Diet:  Pain/Anxiety/Delirium protocol (if indicated): NPO VAP protocol (if indicated): Yes DVT prophylaxis: SCD GI prophylaxis: PPI Glucose control: N/A Mobility: BR Code Status: No re-intubation if fails extubation Family Communication: Discussed at bedside Disposition: ICU  Labs   CBC: Recent Labs  Lab 09/02/19 1130 09/02/19 1134 09/03/19 0319  WBC 8.4  --  11.8*  NEUTROABS 6.7  --   --   HGB 14.9 15.6* 13.3  HCT 47.0* 46.0 40.4  MCV 99.8  --  97.1  PLT 232  --  967    Basic Metabolic Panel: Recent Labs  Lab 09/02/19 1130 09/02/19 1134 09/03/19 0319  NA 134* 136 138  K 4.6 4.3 4.2  CL 104 104 106  CO2 18*  --  21*  GLUCOSE 136* 138* 136*  BUN 22 27* 16  CREATININE 0.85 0.80 0.85  CALCIUM 9.5  --  9.2   GFR: Estimated Creatinine Clearance: 52.8 mL/min (by C-G formula based on SCr of 0.85 mg/dL). Recent Labs  Lab 09/02/19 1130 09/03/19 0319  WBC 8.4 11.8*    Liver Function Tests: Recent Labs  Lab 09/02/19 1130  AST 25  ALT 15  ALKPHOS 63  BILITOT 1.0  PROT 7.2  ALBUMIN 4.2   No results for input(s): LIPASE, AMYLASE in the last 168 hours. No results for input(s): AMMONIA in the last  168 hours.  ABG    Component Value Date/Time   TCO2 21 (L) 09/02/2019 1134     Coagulation Profile: Recent Labs  Lab 09/02/19 1130  INR 1.0    Cardiac Enzymes: No results for input(s): CKTOTAL, CKMB, CKMBINDEX, TROPONINI in the last 168 hours.  HbA1C: Hgb A1c MFr Bld  Date/Time Value Ref Range Status  09/03/2019 03:19 AM 5.8 (H) 4.8 - 5.6 % Final    Comment:    (NOTE) Pre diabetes:          5.7%-6.4%  Diabetes:              >6.4%  Glycemic control for   <7.0% adults with diabetes     CBG: Recent Labs  Lab 09/02/19 1127  [place] 134*    Past Medical History  She,  has a past medical history of Arthritis, Atrial fibrillation ([place]), Cataract, DDD (degenerative disc disease), cervical, Depression, Diverticulosis, Falls, GERD (gastroesophageal reflux disease), Glaucoma, Headache, Hyperlipidemia, Hyperlipidemia, Hypertension, Osteoporosis, Peripheral neuropathy, Spondylosis, and Subdural hematoma ([place]).   Surgical History    Past Surgical History:  Procedure Laterality Date  . APPENDECTOMY    . BACK SURGERY  2010  . BURR HOLE FOR SUBDURAL HEMATOMA     x 2 hematoma  . COLONOSCOPY    . IR PERCUTANEOUS ART THROMBECTOMY/INFUSION INTRACRANIAL INC DIAG ANGIO  09/02/2019      . KNEE SURGERY Bilateral   . VAGINAL HYSTERECTOMY       Social History   reports that she has quit smoking. She has never used smokeless tobacco. She reports current alcohol use of about 14.0 standard drinks of alcohol per week. She reports that she does not use drugs.   Family History   Her family history includes Breast cancer in her sister; Heart disease in her father; Ovarian cancer in her mother. There is no history of Colon cancer, Diabetes, Liver disease, Kidney disease, or Dementia.   Allergies Allergies  Allergen Reactions  . Codeine Other (See Comments)    unknown     Home Medications  Prior to Admission medications   Medication Sig Start Date End Date Taking? Authorizing  Provider  acetaminophen (TYLENOL) 650 MG CR tablet Take 650 mg by mouth 2 (two) times daily.    Yes [provider]  amLODipine (NORVASC) 10 MG tablet Take 10 mg by mouth daily.  04/23/17  Yes [provider]  APPLE CIDER VINEGAR PO Take 1,200 mg by mouth daily.   Yes [provider]  Ascorbic Acid (VITAMIN C) 1000 MG tablet Take 1,000 mg by mouth daily.   Yes [provider]  Biotin 5000 MCG TABS Take 5,000 mcg by mouth daily.   Yes [provider]  Cholecalciferol (VITAMIN D3 SUPER STRENGTH) 50 MCG (2000 UT) TABS Take 2,000 Units by mouth daily.   Yes [provider]  clobetasol (TEMOVATE) 0.05 % external solution Apply 1 application topically daily as needed (scalp itching).  Yes [provider]  diphenhydrAMINE HCl, Sleep, (ZZZQUIL PO) Take 3 capsules by mouth at bedtime.   Yes [provider]  donepezil (ARICEPT) 10 MG tablet Take 10 mg by mouth at bedtime.   Yes [provider]  latanoprost (XALATAN) 0.005 % ophthalmic solution Place 1 drop into both eyes at bedtime.   Yes [provider]  loratadine (CLARITIN) 10 MG tablet Take 10 mg by mouth daily as needed for itching.    Yes [provider]  losartan (COZAAR) 25 MG tablet Take 25 mg by mouth daily.   Yes [provider]  Melatonin 10 MG TABS Take 30 mg by mouth at bedtime.    Yes [provider]  meloxicam (MOBIC) 15 MG tablet Take 15 mg by mouth daily as needed for pain.    Yes [provider]  metoprolol tartrate (LOPRESSOR) 50 MG tablet Take 50 mg by mouth 2 (two) times daily.    Yes [provider]  omeprazole (PRILOSEC) 40 MG capsule Take 40 mg by mouth daily.  09/08/18  Yes [provider]  OVER THE COUNTER MEDICATION Take 2 tablets by mouth daily. "Sugar Bear Hair"   Yes [provider]  Psyllium (METAMUCIL PO) Take 1 Dose by mouth daily as needed (constipation).   Yes [provider]  Sennosides (EX-LAX PO) Take 1 tablet by mouth daily as needed (constipation).   Yes [provider]  sertraline (ZOLOFT) 50 MG tablet Take 50 mg by mouth daily.  09/08/18  Yes [provider]  Simethicone (GAS-X PO) Take 1 tablet by mouth daily as needed (gas/bloating/flatulence).   Yes [provider]  simvastatin (ZOCOR) 20 MG tablet Take 20 mg by mouth at bedtime.    Yes [provider]  timolol (TIMOPTIC) 0.5 % ophthalmic solution Place 1 drop into both eyes daily.   Yes [provider]  triamcinolone lotion (KENALOG) 0.1 % Apply 1 application topically daily as needed (itching).   Yes [provider]  TURMERIC PO Take 200 mg by mouth daily.   Yes [provider]  vitamin B-12 (CYANOCOBALAMIN) 1000 MCG tablet Take 3,000 mcg by mouth daily.   Yes [provider]     Critical care time: 30 min

## 2019-09-03 NOTE — Progress Notes (Signed)
Patient transported to MRI & back on ventilator with no problems. Patient was on full vent support during MRI & placed back on wean once back in room.

## 2019-09-03 NOTE — Progress Notes (Signed)
SLP Cancellation Note  Patient Details Name: Marjory FELECITY LEMASTER MRN: [id number] DOB: [birth year]   Cancelled treatment:       Reason Eval/Treat Not Completed: Medical issues which prohibited therapy (pt on vent this am). Will f/u as able.   Osie Bond., M.A. [facility] Pager [phone] Office [phone]  09/03/2019, 7:42 AM

## 2019-09-03 NOTE — Discharge Instructions (Addendum)
Femoral Site Care °This sheet gives you information about how to care for yourself after your procedure. Your health care provider may also give you more specific instructions. If you have problems or questions, contact your health care provider. °What can I expect after the procedure? °After the procedure, it is common to have: °· Bruising that usually fades within 1-2 weeks. °· Tenderness at the site. °Follow these instructions at home: °Wound care °1. Follow instructions from your health care provider about how to take care of your insertion site. Make sure you: °? Wash your hands with soap and water before you change your bandage (dressing). If soap and water are not available, use hand sanitizer. °? Change your dressing as directed- pressure dressing removed 24 hours post-procedure (and switch for bandaid), bandaid removed 72 hours post-procedure °2. Do not take baths, swim, or use a hot tub for 7 days post-procedure. °3. You may shower 48 hours after the procedure or as told by your health care provider. °? Gently wash the site with plain soap and water. °? Pat the area dry with a clean towel. °? Do not rub the site. This may cause bleeding. °4. Check your femoral site every day for signs of infection. Check for: °? Redness, swelling, or pain. °? Fluid or blood. °? Warmth. °? Pus or a bad smell. °Activity °· Do not stoop, bend, or lift anything that is heavier than 10 lb (4.5 kg) for 2 weeks post-procedure. °· Do not drive self for 2 weeks post-procedure. °Contact a health care provider if you have: °· A fever or chills. °· You have redness, swelling, or pain around your insertion site. °Get help right away if: °· The catheter insertion area swells very fast. °· You pass out. °· You suddenly start to sweat or your skin gets clammy. °· The catheter insertion area is bleeding, and the bleeding does not stop when you hold steady pressure on the area. °· The area near or just beyond the catheter insertion site  becomes pale, cool, tingly, or numb. °These symptoms may represent a serious problem that is an emergency. Do not wait to see if the symptoms will go away. Get medical help right away. Call your local emergency services (911 in the [place] Do not drive yourself to the hospital. ° °This information is not intended to replace advice given to you by your health care provider. Make sure you discuss any questions you have with your health care provider. °Document Revised: 01/18/2017 Document Reviewed: 01/18/2017 °Elsevier Patient Education © 2020 [employer] °

## 2019-09-03 NOTE — Progress Notes (Signed)
Patient placed back on full support for transport to CT. Tolerated well. Placed back on wean upon arrival back to room. RT to monitor.

## 2019-09-03 NOTE — Progress Notes (Addendum)
STROKE TEAM PROGRESS NOTE   HISTORY OF PRESENT ILLNESS (per record) [name] is a 86 y.o. female with a history of atrial fibrillation (not on anticoagulation secondary to prior subdural hematoma), hypertension, hyperlipidemia, heart disease, mild cognitive impairment of vascular etiology complicated by alcohol use. She lives in independent living at [place] at [facility], and family last spoke to her at 2:30 PM on 8/13.  In the morning of 8/14 at 10:30 AM when they called her again, her speech was garbled and EMS was activated. On arrival the patient had left-sided gaze deviation and nonrhythmic shaking of the left side.  EMS noted that blood pressures were in the 180s over 90s, glucose was 149.  LKW: 2:30 PM on 8/13 tPA given?: No, due to out of window IA performed?:  Yes, TICI3 reperfusion with small subdural hemorrhage Premorbid modified rankin scale:      0 - No symptoms.   INTERVAL HISTORY Her  2 daughters are at the bedside.  I have personally reviewed history of presenting illness, electronic medical records and imaging films in PACS.  She presented with aphasia left gaze deviation and right hemiplegia due to left M1 occlusion and underwent successful mechanical thrombectomy with revascularization.  She remains intubated post procedure but is arousable but remains aphasic with dense right hemiplegia.  Blood pressure adequately controlled.  Vital signs stable.  CT scan of the head this morning done during rounds shows mild edema in left basal ganglia/corona radiata with petechial reperfusion hemorrhage versus contrast ending in the basal ganglia.    OBJECTIVE Vitals:   09/03/19 0400 09/03/19 0415 09/03/19 0430 09/03/19 0434  BP: 130/73 138/74 138/65   Pulse: 76 86 74   Resp: (!) 22 (!) 27 20   Temp: 98.6 F (37 C)     TempSrc: Oral     SpO2: 100% 95% 100% 100%  Weight:      Height:        CBC:  Recent Labs  Lab 09/02/19 1130 09/02/19 1130 09/02/19 1134  09/03/19 0319  WBC 8.4  --   --  11.8*  NEUTROABS 6.7  --   --   --   HGB 14.9   < > 15.6* 13.3  HCT 47.0*   < > 46.0 40.4  MCV 99.8  --   --  97.1  PLT 232  --   --  225   < > = values in this interval not displayed.    Basic Metabolic Panel:  Recent Labs  Lab 09/02/19 1130 09/02/19 1130 09/02/19 1134 09/03/19 0319  NA 134*   < > 136 138  K 4.6   < > 4.3 4.2  CL 104   < > 104 106  CO2 18*  --   --  21*  GLUCOSE 136*   < > 138* 136*  BUN 22   < > 27* 16  CREATININE 0.85   < > 0.80 0.85  CALCIUM 9.5  --   --  9.2   < > = values in this interval not displayed.    Lipid Panel:     Component Value Date/Time   CHOL 206 (H) 09/03/2019 0319   TRIG 230 (H) 09/03/2019 0319   HDL 41 09/03/2019 0319   CHOLHDL 5.0 09/03/2019 0319   VLDL 46 (H) 09/03/2019 0319   LDLCALC 119 (H) 09/03/2019 0319   HgbA1c:  Lab Results  Component Value Date   HGBA1C 5.8 (H) 09/03/2019   Urine Drug Screen: No  results found for: LABOPIA, COCAINSCRNUR, LABBENZ, AMPHETMU, THCU, LABBARB  Alcohol Level No results found for: [place]  IMAGING  CT HEAD CODE STROKE WO CONTRAST CT ANGIO HEAD CODE STROKE CT ANGIO NECK CODE STROKE CT CEREBRAL PERFUSION W CONTRAST 09/02/2019 IMPRESSION:  No acute intracranial hemorrhage. 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 ASPECT score of 8. There is nearly occlusive thrombus within the proximal left M1 MCA extending into an M2 branch. Perfusion imaging demonstrates core infarction of 4 mL (corresponds to noncontrast CT finding) and penumbra of 83 mL in the left MCA territory.   DIAGNOSTIC CEREBRAL ANGIOGRAM AND MECHANICAL THROMBECTOMY Dr. Erven Colla de Sindy Messing Proximal left M1/MCA occlusion. 3 passes performed with aspiration + stent retriever (embotrap 63mm) with complete recanalization. No embolus to new or distal territory. Small right sylvian fissure SAH on post procedural flat panel CT. PLAN:  - Transfer to ICU - Maintain intubated  as no covid-19 test result available - SBP 120-140 mmHg - Bed rest post femoral access x6 hours  MRI Brain - pending  Transthoracic Echocardiogram  00/00/2021 Pending   ECG - atrial fibrillation - ventricular response 92 BPM (See cardiology reading for complete details)    PHYSICAL EXAM Blood pressure 138/65, pulse 74, temperature 98.6 F (37 C), temperature source Oral, resp. rate 20, height 5\' 5"  (1.651 m), weight 84.3 kg, SpO2 100 %. Pleasant elderly Caucasian lady who is intubated not sedated.  Not in distress. . Afebrile. Head is nontraumatic. Neck is supple without bruit.    Cardiac exam no murmur or gallop. Lungs are clear to auscultation. Distal pulses are well felt.  She is wearing a neck collar Neurological Exam : Patient is intubated.  Drowsy but opens eyes to sternal rub.  She appears to be aphasic and does not follow commands consistently.  Has left gaze preference and she will not look to the right past midline.  She blinks to threat on the left but not on the right.  Right facial weakness.  Tongue midline.  She moves left upper and lower extremities purposefully against gravity.  Right upper extremity is flaccid and only trace withdrawal to pain.  She does withdraw right lower extremity pain but not voluntarily.  Deep tendon reflexes are depressed on the right and present on the left right plantar upgoing left downgoing.  Gait not tested  ASSESSMENT/PLAN [name] is a 86 y.o. female with history of atrial fibrillation (not on anticoagulation secondary to prior subdural hematoma), hypertension, hyperlipidemia, heart disease, mild cognitive impairment of vascular etiology complicated by alcohol use presenting with speech disturbance, left-sided gaze deviation and nonrhythmic shaking of the left side. She did not receive IV t-PA due to late presentation (>4.5 hours from time of onset). IR - Proximal left M1/MCA occlusion. 3 passes aspiration + stent retriever with  complete recanalization. Small right sylvian fissure SAH post procedure.  Stroke:  Left MCA territory acute infarction - embolic - atrial fibrillation  Resultant aphasia and right hemiplegia  Code Stroke CT Head -  Left MCA territory acute infarction involving the basal ganglia. ASPECT score of 8.    CT head - Small right sylvian fissure SAH on post procedural flat panel CT.  MRI head - pending  MRA head - not ordered  CTA H&N - nearly occlusive thrombus within the proximal left M1 MCA extending into an M2 branch  CT Perfusion - core infarction of 4 mL (corresponds to noncontrast CT finding) and penumbra of 83 mL in the  left MCA territory  Carotid Doppler - CTA neck ordered - carotid dopplers not indicated.  2D Echo - pending  Lacey Jensen Virus 2 - negative  LDL - 119  HgbA1c - 5.8  UDS - not ordered  VTE prophylaxis - SCDs Diet  Diet Order            Diet NPO time specified  Diet effective now                 No antithrombotic prior to admission, now on No antithrombotic  Patient will be counseled to be compliant with her antithrombotic medications  Ongoing aggressive stroke risk factor management  Therapy recommendations:  Pending - bed rest post femoral access x6 hours  Disposition:  Pending  Hypertension  Home BP meds: metoprolol ; Norvasc ; Cozaar  Current BP meds: metoprolol ; Cleviprex ; Neosynephrine prn  Stable . BP goal per Dr Debbrah Alar - SBP 120-140 mmHg  . Long-term BP goal normotensive  Hyperlipidemia  Home Lipid lowering medication: Zocor 20 mg daily  LDL 119, goal < 70  Current lipid lowering medication: Zocor 20 mg daily -> change to Lipitor 40 mg daily per pharmacist's suggestion.  Continue statin at discharge  Other Stroke Risk Factors  Advanced age  Former cigarette smoker - quit  ETOH abuse, advised to drink no more than 1 alcoholic beverage per day.  Obesity, Body mass index is 30.93 kg/m., recommend weight loss,  diet and exercise as appropriate   Other Active Problems  Code status - Full code  ETOH - CIWA  Mild Leukocytosis - WBCs - 8.4->11.8 (afebrile)  Coronary artery disease  Hx of mild cognitive impairment  Atrial fibrillation hx - not on anticoagulation pta secondary to prior subdural hematoma  SAH post procedure  Hospital day # 1  She presented with aphasia and right hemiplegia due to left M1 occlusion and underwent successful mechanical thrombectomy.  Continue close neurological monitoring and strict blood pressure control as per post IR protocol.  Check MRI scan of the brain later today.  Extubate per critical care medicine if she meets criteria.  Aspirin rectally.  She may at some point need anticoagulation but in the past because of history of subdural hemorrhage it had been held.  Long discussion the patient's 2 daughters at the bedside and answered questions about her care.  Discussed with Dr. Rennis Harding critical care medicine. This patient is critically ill and at significant risk of neurological worsening, death and care requires constant monitoring of vital signs, hemodynamics,respiratory and cardiac monitoring, extensive review of multiple databases, frequent neurological assessment, discussion with family, other specialists and medical decision making of high complexity.I have made any additions or clarifications directly to the above note.This critical care time does not reflect procedure time, or teaching time or supervisory time of PA/NP/Med Resident etc but could involve care discussion time.  I spent 40 minutes of neurocritical care time  in the care of  this patient.     Antony Contras, MD To contact Stroke Continuity provider, please refer to [email or web address] After hours, contact General Neurology

## 2019-09-04 ENCOUNTER — Encounter (HOSPITAL_COMMUNITY): Payer: Self-pay | Admitting: Radiology

## 2019-09-04 DIAGNOSIS — J9601 Acute respiratory failure with hypoxia: Secondary | ICD-10-CM | POA: Diagnosis not present

## 2019-09-04 DIAGNOSIS — E785 Hyperlipidemia, unspecified: Secondary | ICD-10-CM | POA: Diagnosis not present

## 2019-09-04 DIAGNOSIS — I10 Essential (primary) hypertension: Secondary | ICD-10-CM | POA: Diagnosis not present

## 2019-09-04 DIAGNOSIS — I63511 Cerebral infarction due to unspecified occlusion or stenosis of right middle cerebral artery: Secondary | ICD-10-CM | POA: Diagnosis not present

## 2019-09-04 HISTORY — PX: IR CT HEAD LTD: IMG2386

## 2019-09-04 HISTORY — PX: IR PERCUTANEOUS ART THROMBECTOMY/INFUSION INTRACRANIAL INC DIAG ANGIO: IMG6087

## 2019-09-04 LAB — GLUCOSE, CAPILLARY
Glucose-Capillary: 115 mg/dL — ABNORMAL HIGH (ref 70–99)
Glucose-Capillary: 123 mg/dL — ABNORMAL HIGH (ref 70–99)
Glucose-Capillary: 134 mg/dL — ABNORMAL HIGH (ref 70–99)

## 2019-09-04 LAB — CBC
HCT: 39.4 % (ref 36.0–46.0)
Hemoglobin: 13.1 g/dL (ref 12.0–15.0)
MCH: 32.3 pg (ref 26.0–34.0)
MCHC: 33.2 g/dL (ref 30.0–36.0)
MCV: 97.3 fL (ref 80.0–100.0)
Platelets: 202 10*3/uL (ref 150–400)
RBC: 4.05 MIL/uL (ref 3.87–5.11)
RDW: 12.4 % (ref 11.5–15.5)
WBC: 10 10*3/uL (ref 4.0–10.5)
nRBC: 0 % (ref 0.0–0.2)

## 2019-09-04 LAB — BASIC METABOLIC PANEL
Anion gap: 13 (ref 5–15)
BUN: 11 mg/dL (ref 8–23)
CO2: 24 mmol/L (ref 22–32)
Calcium: 9.4 mg/dL (ref 8.9–10.3)
Chloride: 97 mmol/L — ABNORMAL LOW (ref 98–111)
Creatinine, Ser: 0.71 mg/dL (ref 0.44–1.00)
GFR calc Af Amer: >60 mL/min (ref >60)
GFR calc non Af Amer: >60 mL/min (ref >60)
Glucose, Bld: 118 mg/dL — ABNORMAL HIGH (ref 70–99)
Potassium: 3.9 mmol/L (ref 3.5–5.1)
Sodium: 134 mmol/L — ABNORMAL LOW (ref 135–145)

## 2019-09-04 MED ORDER — GLYCOPYRROLATE 0.2 MG/ML IJ SOLN
0.1000 mg | INTRAMUSCULAR | Status: DC | PRN
  Administered 2019-09-05 – 2019-09-07 (×5): 0.1 mg via INTRAVENOUS
  Filled 2019-09-04 (×5): qty 1

## 2019-09-04 MED ORDER — FENTANYL CITRATE (PF) 100 MCG/2ML IJ SOLN
25.0000 ug | INTRAMUSCULAR | Status: DC | PRN
  Administered 2019-09-04: 25 ug via INTRAVENOUS
  Filled 2019-09-04: qty 2

## 2019-09-04 MED ORDER — ASPIRIN 81 MG PO CHEW
81.0000 mg | CHEWABLE_TABLET | Freq: Every day | ORAL | Status: DC
  Administered 2019-09-04 – 2019-09-07 (×4): 81 mg
  Filled 2019-09-04 (×4): qty 1

## 2019-09-04 MED ORDER — ASPIRIN 300 MG RE SUPP
300.0000 mg | Freq: Every day | RECTAL | Status: DC
  Filled 2019-09-04: qty 1

## 2019-09-04 MED ORDER — OSMOLITE 1.2 CAL PO LIQD
1000.0000 mL | ORAL | Status: DC
  Administered 2019-09-04: 1000 mL
  Filled 2019-09-04 (×2): qty 1000

## 2019-09-04 MED ORDER — GLYCOPYRROLATE 0.2 MG/ML IJ SOLN
0.1000 mg | INTRAMUSCULAR | Status: DC
  Administered 2019-09-04: 0.1 mg via INTRAVENOUS
  Filled 2019-09-04: qty 1

## 2019-09-04 NOTE — Evaluation (Signed)
Clinical/Bedside Swallow Evaluation Patient Details  Name: [name] MRN: [id number] Date of Birth: January 13, 1933  Today's Date: 09/04/2019 Time: SLP Start Time (ACUTE ONLY): 6294 SLP Stop Time (ACUTE ONLY): 1542 SLP Time Calculation (min) (ACUTE ONLY): 11 min  Past Medical History:  Past Medical History:  Diagnosis Date  . Arthritis   . Atrial fibrillation ([place])   . Cataract    BILATERAL  . DDD (degenerative disc disease), cervical   . Depression   . Diverticulosis   . Falls   . GERD (gastroesophageal reflux disease)   . Glaucoma   . Headache   . Hyperlipidemia   . Hyperlipidemia   . Hypertension   . Osteoporosis   . Peripheral neuropathy   . Spondylosis    lumbar  . Subdural hematoma [facility])    Past Surgical History:  Past Surgical History:  Procedure Laterality Date  . APPENDECTOMY    . BACK SURGERY  2010  . BURR HOLE FOR SUBDURAL HEMATOMA     x 2 hematoma  . COLONOSCOPY    . IR CT HEAD LTD  09/04/2019  . IR PERCUTANEOUS ART THROMBECTOMY/INFUSION INTRACRANIAL INC DIAG ANGIO  09/02/2019      . IR PERCUTANEOUS ART THROMBECTOMY/INFUSION INTRACRANIAL INC DIAG ANGIO  09/04/2019  . KNEE SURGERY Bilateral   . RADIOLOGY WITH ANESTHESIA N/A 09/02/2019   Procedure: IR WITH ANESTHESIA;  Surgeon: Radiologist, Medication, MD;  Location: [facility];  Service: Radiology;  Laterality: N/A;  . VAGINAL HYSTERECTOMY     HPI:  86 year old female with past medical history of A. fib not on anticoagulation due to frequent falling and prior subdural hematoma, hypertension, memory loss, GERD who was admitted to the hospital for altered mental status, found to have a left MCA stroke and is now status post thrombectomy. Extubated 8/16.    Assessment / Plan / Recommendation Clinical Impression  Pt has audible secretions at baseline, requiring NTS earlier per RN. There is R-sided facial weakness (CN VII) with reduced labial seal and anterior bolus loss. Pt does swallow ice chips and thin liquids,  but there are multiple subswallows noted and coughing is elicited. Her voice is hoarse post-extubation, with good potential for some improvements with additional time post-extubation, but there is still an underlying neurogenic dysphagia as well. Would remain NPO with temporary, alternative means of nutrition with tentative plan to perform MBS over the next few days.  SLP Visit Diagnosis: Dysphagia, oropharyngeal phase (R13.12)    Aspiration Risk  Moderate aspiration risk    Diet Recommendation NPO;Alternative means - temporary   Medication Administration: Via alternative means    Other  Recommendations Oral Care Recommendations: Oral care QID Other Recommendations: Have oral suction available   Follow up Recommendations Inpatient Rehab      Frequency and Duration min 2x/week  2 weeks       Prognosis Prognosis for Safe Diet Advancement: Good Barriers to Reach Goals: Cognitive deficits;Language deficits      Swallow Study   General HPI: 86 year old female with past medical history of A. fib not on anticoagulation due to frequent falling and prior subdural hematoma, hypertension, memory loss, GERD who was admitted to the hospital for altered mental status, found to have a left MCA stroke and is now status post thrombectomy. Extubated 8/16.  Type of Study: Bedside Swallow Evaluation Previous Swallow Assessment: none in chart Diet Prior to this Study: NPO;NG Tube Temperature Spikes Noted: Yes (100.1) Respiratory Status: Nasal cannula History of Recent Intubation: Yes Length of Intubations (days):  2 days Date extubated: 09/04/19 Behavior/Cognition: Alert;Cooperative;Requires cueing Oral Cavity Assessment: Within Functional Limits Oral Care Completed by SLP: Yes Oral Cavity - Dentition: Adequate natural dentition Self-Feeding Abilities: Needs assist Patient Positioning: Upright in bed Baseline Vocal Quality: Low vocal intensity Volitional Swallow: Able to elicit     Oral/Motor/Sensory Function Overall Oral Motor/Sensory Function: Moderate impairment Facial Symmetry: Abnormal symmetry right;Suspected CN VII (facial) dysfunction Facial Strength: Reduced right;Suspected CN VII (facial) dysfunction Facial Sensation: Reduced right;Suspected CN V (Trigeminal) dysfunction   Ice Chips Ice chips: Impaired Presentation: Spoon Oral Phase Impairments: Reduced lingual movement/coordination;Reduced labial seal Pharyngeal Phase Impairments: Cough - Immediate;Multiple swallows   Thin Liquid Thin Liquid: Impaired Presentation: Spoon;Straw Oral Phase Impairments: Reduced labial seal;Reduced lingual movement/coordination Oral Phase Functional Implications: Right anterior spillage;Left anterior spillage Pharyngeal  Phase Impairments: Cough - Immediate;Multiple swallows    Nectar Thick Nectar Thick Liquid: Not tested   Honey Thick Honey Thick Liquid: Not tested   Puree Puree: Not tested   Solid     Solid: Not tested      Osie Bond., M.A. [facility] Pager [phone] Office [phone]  09/04/2019,4:17 PM

## 2019-09-04 NOTE — Progress Notes (Signed)
[facility] [facility] Progress Note Patient Name: [name] DOB: Jul 21, 1932 MRN: [id number]   Date of Service  09/04/2019  HPI/Events of Note  Agitation - Propofol IV infusion held for possible extubation in AM. Nursing request for Fentanyl PRN. BP = 153/74.  eICU Interventions  Plan: 1. Fentanyl 25-100 mcg IV Q 2 hours PRN agitation.      Intervention Category Major Interventions: Delirium, psychosis, severe agitation - evaluation and management  Jeanae Whitmill [place] 09/04/2019, 12:01 AM

## 2019-09-04 NOTE — Progress Notes (Signed)
Inpatient Rehab Admissions Coordinator Note:   Per therapy recommendations, pt was screened for CIR candidacy by Allycia Pitz, MS CCC-SLP. At this time, Pt. Appears to have functional decline and is a good candidate for CIR. Will request order for rehab consult per protocol.  Please contact me with questions.   Taryll Reichenberger, MS, CCC-SLP Rehab Admissions Coordinator  [phone] (celll) [phone] (office)  

## 2019-09-04 NOTE — Progress Notes (Signed)
   NAME:  Jashiya G Pinkstaff, MRN:  [id number], DOB:  1932-10-28, LOS: 2 ADMISSION DATE:  09/02/2019, CONSULTATION DATE:  09/02/2019 REFERRING MD:  Curly [place], CHIEF COMPLAINT:  Intubation for airway protection   Brief History   This is an 86 year old female with past medical history of A. fib not on anticoagulation due to frequent falling and prior subdural hematoma, hypertension,memory loss, GERD who was admitted to the hospital for altered mental status,found to have a left MCA stroke and is now status post thrombectomy. PCCM consulted for post-intubation for airway protection and vent management.  Past Medical History  Atrial fibrillation not on anticoagulation due to prior subdural hematoma Hypertension  Hyperlipidemia Memory loss GERD  Consults:  PCCM Neurology stroke  Procedures:  8/14 ETT >  Significant Diagnostic Tests:  8/14 CTA head: No acute intracranial hemorrhage. 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 ASPECT score of 8. There is nearly occlusive thrombus within the proximal left M1 MCA extending into an M2 branch. Perfusion imaging demonstrates core infarction of 4 mL (corresponds to noncontrast CT finding) and penumbra of 83 mL in the left MCA territory  Antimicrobials:  none   Interim history/subjective:  Tolerating PSV 5/5, good mental status. Hoping for extubation today.  Objective   Blood pressure (!) 160/75, pulse 79, temperature 99 F (37.2 C), temperature source Axillary, resp. rate 15, height 5\' 5"  (1.651 m), weight 84.3 kg, SpO2 100 %.    Vent Mode: PSV;CPAP FiO2 (%):  [40 %] 40 % Set Rate:  [12 bmp] 12 bmp Vt Set:  [500 mL] 500 mL PEEP:  [5 cmH20] 5 cmH20 Pressure Support:  [5 cmH20-10 cmH20] 10 cmH20 Plateau Pressure:  [16 cmH20-18 cmH20] 18 cmH20   Intake/Output Summary (Last 24 hours) at 09/04/2019 0840 Last data filed at 09/04/2019 0600 Gross per 24 hour  Intake 1087.34 ml  Output 2025 ml  Net -937.66 ml   Filed  Weights   09/02/19 1210  Weight: 84.3 kg    Examination: General: Elderly female, in NAD HENT: [place]/AT, ETT in place Lungs: Clear bilaterally Cardiovascular: irreg. No m/r/g Abdomen: Supple, no guarding, +BS Extremities: No c/c/e Neuro: Lethargic despite min-to sedation. Does follow some simple commands GU: Foley  Assessment & Plan:   Left MCA stroke status post thrombectomy. - Per neurology.  Respiratory insufficiency - 2/2 above. Tolerating PSV 5/5 AM 8/16 (also tolerated all day 8/15 but mental status was a barrier). - Plan for extubation this morning. - No re-intubation in the case of failed extubation (confirmed with daughter at bedside).  HTN. Continue amlodipine, lopressor and Losartan for BP control.  Best practice:  Diet: NPO Pain/Anxiety/Delirium protocol (if indicated): Fentanyl PRN VAP protocol (if indicated): Yes DVT prophylaxis: SCD GI prophylaxis: PPI Glucose control: N/A Mobility: BR Code Status: DNR with no re-intubation if fails extubation Family Communication: Discussed with daughter at bedside Disposition: ICU  Critical care time: 30 min    Montey Hora, [place] [facility] 09/04/2019, 8:54 AM

## 2019-09-04 NOTE — Progress Notes (Signed)
OT Cancellation Note  Patient Details Name: [name] MRN: [id number] DOB: Jun 14, 1932   Cancelled Treatment:    Reason Eval/Treat Not Completed: Other (comment)  Ezrah Dembeck,HILLARY 09/04/2019, 6:30 AM  [name], OT/L   Acute OT Clinical Specialist Acute Rehabilitation Services Pager [phone] Office [phone]

## 2019-09-04 NOTE — Progress Notes (Signed)
Occupational Therapy Evaluation  PTA, pt lived at [employer] and was independent with ADL and mobility. Pt drove and was active in clubs/loved to socialize. Pt requires Max A +2 with mobility and was able to stand with Max A +2. Requires Max A with ADL although able to using toothette to brush teeth using non-dominant L hand while sitting EOB with min A for trunk support. Following 1 step commands with delay and verbalizing at times during session. Preferring yes/no head nods. Pt HOH. Daughter present for session. Pt very motivated and tolerated session well with excellent participation given that she was extubated this am.  Feel pt is an excellent candidate for CIR - will most likely need 24/7 assistance after DC. Daughter states they can arrange for assistance. Will follow acutely    09/04/19 1500  OT Visit Information  Last OT Received On 09/04/19  Assistance Needed +2  PT/OT/SLP Co-Evaluation/Treatment Yes  Reason for Co-Treatment Complexity of the patient's impairments (multi-system involvement);For patient/therapist safety;To address functional/ADL transfers  OT goals addressed during session ADL's and self-care;Strengthening/ROM  History of Present Illness 86 year old female with past medical history of A. fib not on anticoagulation due to frequent falling and prior subdural hematoma, hypertension, memory loss, GERD who was admitted to the hospital for altered mental status, found to have a left MCA stroke and is now status post thrombectomy. Extubated 8/16.   Precautions  Precautions Fall  Precaution Comments corerack; R inattention  Home Living  Family/patient expects to be discharged to: Private residence  Living Arrangements Alone  Available Help at Discharge Available PRN/intermittently  Type of Home Independent living facility  Home Access Level entry  [place] One level  Bathroom Shower/Tub Walk-in shower  [place] height  Bathroom  Accessibility Yes  How Accessible Accessible via wheelchair  [place] seat;Grab bars - toilet;Grab bars - tub/shower;Cane - single point;Walker - 2 wheels  Prior Function  Level of Independence Independent with assistive device(s);Needs assistance  ADL's / Homemaking Assistance Needed has meals delivered, but can do breakfast and coffee, also has cleaning help every other week  Comments independent living cottage; cane; had assistance with cleaning; drives; has had "alot of falls" per daughter - none in past 6 months; does have life alert but she doesn't use it"very non-compliant" per daughter  Communication  Communication HOH;Expressive difficulties  Pain Assessment  Pain Assessment Faces  Faces Pain Scale 0  Cognition  Arousal/Alertness Awake/alert  Behavior During Therapy Flat affect  Overall Cognitive Status Difficult to assess  Area of Impairment Orientation;Attention;Following commands;Problem solving;Safety/judgement;Awareness  Orientation Level Disoriented to;Place;Situation  Current Attention Level Sustained  Memory Decreased short-term memory  Following Commands Follows one step commands consistently;Follows one step commands with increased time  Safety/Judgement Decreased awareness of safety;Decreased awareness of deficits  Awareness Emergent  Problem Solving Slow processing;Decreased initiation  General Comments daughter in the room and reports pt more alert and interactive in therapy session than previously yesterday or today since extubated  Difficult to assess due to Impaired communication  Upper Extremity Assessment  Upper Extremity Assessment RUE deficits/detail  RUE Deficits / Details no spontaneous movement noted; abnormal tone; no shoulder shrug observed; not using as functional assist  RUE Sensation decreased light touch;decreased proprioception  RUE Coordination decreased fine motor;decreased gross motor  Lower Extremity Assessment  Lower Extremity  Assessment Defer to PT evaluation  Cervical / Trunk Assessment  Cervical / Trunk Assessment Kyphotic;Other exceptions  Cervical / Trunk Exceptions R trunk retraction; R  bias  ADL  Overall ADL's  Needs assistance/impaired  Eating/Feeding NPO  Grooming Moderate assistance  Grooming Details (indicate cue type and reason) able to "brush teeth" with toothette after set up using L hand  Upper Body Bathing Moderate assistance;Sitting  Lower Body Bathing Maximal assistance;Sit to/from stand;Sitting/lateral leans  Upper Body Dressing  Maximal assistance;Sitting  Lower Body Dressing Total assistance  Toileting- Clothing Manipulation and Hygiene Total assistance  Functional mobility during ADLs Maximal assistance;+2 for physical assistance  Vision- History  Baseline Vision/History Wears glasses  Wears Glasses Reading only  Patient Visual Report Other (comment) ("yes" when asked if vision was different)  Vision- Assessment  Vision Assessment? Vision impaired- to be further tested in functional context;Yes  Eye Alignment WFL  Ocular Range of Motion [facility]  Alignment/Gaze Preference Gaze left (L preference)  Tracking/Visual Pursuits Decreased smoothness of horizontal tracking;Decreased smoothness of vertical tracking  Saccades Additional eye shifts occurred during testing  Visual Fields Impaired-to be further tested in functional context  Perception  Perception Tested? Yes  Spatial deficits inattention R side; will further assess  Praxis  Praxis tested? Deficits  Praxis-Other Comments slow processing; will further assess  Bed Mobility  Overal bed mobility Needs Assistance  Bed Mobility Rolling;Sidelying to Sit;Sit to Sidelying  Rolling Mod assist;+2 for safety/equipment  Sidelying to sit HOB elevated;Mod assist;+2 for safety/equipment;+2 for physical assistance  Sit to sidelying +2 for physical assistance;Max assist  Transfers  Overall transfer level Needs assistance  Equipment used 2  person hand held assist  Transfers Sit to/from Stand  Sit to Stand Max assist;+2 physical assistance  General transfer comment assist for anterior weight shift and cues and assist for L lateral lean due to R LE weakness and leaning R at times, only up for about 10 seconds with R LE forward and knee in hyperextension  Balance  Overall balance assessment Needs assistance  Sitting-balance support Feet supported;Single extremity supported  Sitting balance-Leahy Scale Poor  Sitting balance - Comments leans to R but able to correct up from falling, but still leaning R and needing min A to mod A throughout for safety with balance, pt needing frequent cues to correct due to inattention, up at EOB about 15 minutes; able to assist with oral hygiene with L UE  Exercises  Exercises Other exercises  Other Exercises  Other Exercises RUE PROM through full ROM  OT - End of Session  Equipment Utilized During Treatment Gait belt;Oxygen (2L)  Activity Tolerance Patient tolerated treatment well  Patient left in bed;with call bell/phone within reach;with bed alarm set;with family/visitor present;Other (comment);with SCD's reapplied (chair position)  Nurse Communication Mobility status  OT Assessment  OT Recommendation/Assessment Patient needs continued OT Services  OT Visit Diagnosis Unsteadiness on feet (R26.81);Other abnormalities of gait and mobility (R26.89);Muscle weakness (generalized) (M62.81);Repeated falls (R29.6);Other symptoms and signs involving the nervous system (R29.898);Other symptoms and signs involving cognitive function;Cognitive communication deficit (R41.841);Hemiplegia and hemiparesis  Symptoms and signs involving cognitive functions Cerebral infarction  Hemiplegia - Right/Left Right  Hemiplegia - dominant/non-dominant Dominant  Hemiplegia - caused by Cerebral infarction  OT Problem List Decreased strength;Decreased range of motion;Decreased activity tolerance;Impaired balance (sitting  and/or standing);Impaired vision/perception;Decreased coordination;Decreased cognition;Decreased safety awareness;Decreased knowledge of use of DME or AE;Decreased knowledge of precautions;Impaired sensation;Impaired tone;Obesity;Impaired UE functional use  OT Plan  OT Frequency (ACUTE ONLY) Min 2X/week  OT Treatment/Interventions (ACUTE ONLY) Self-care/ADL training;Therapeutic exercise;Neuromuscular education;Energy conservation;DME and/or AE instruction;Therapeutic activities;Cognitive remediation/compensation;Visual/perceptual remediation/compensation;Patient/family education;Balance training  AM-PAC OT "6 Clicks" Daily Activity Outcome Measure (Version  2)  Help from another person eating meals? 1  Help from another person taking care of personal grooming? 2  Help from another person toileting, which includes using toliet, bedpan, or urinal? 1  Help from another person bathing (including washing, rinsing, drying)? 2  Help from another person to put on and taking off regular upper body clothing? 2  Help from another person to put on and taking off regular lower body clothing? 1  6 Click Score 9  OT Recommendation  Recommendations for Other Services Rehab consult  Follow Up Recommendations CIR;Supervision/Assistance - 24 hour  OT Equipment Wheelchair (measurements OT);3 in 1 bedside commode  Individuals Consulted  Consulted and Agree with Results and Recommendations Patient;Family member/caregiver  Family Member Consulted daughter  Acute Rehab OT Goals  Patient Stated Goal daughter interested in CIR  OT Goal Formulation With patient/family  Time For Goal Achievement 09/18/19  Potential to Achieve Goals Good  OT Time Calculation  OT Start Time (ACUTE ONLY) 1140  OT Stop Time (ACUTE ONLY) 1215  OT Time Calculation (min) 35 min  OT General Charges  $OT Visit 1 Visit  OT Evaluation  $OT Eval Moderate Complexity 1 Mod  Written Expression  Dominant Hand Right  [name], OT/L   Acute  OT Clinical Specialist Acute Rehabilitation Services Pager [phone] Office [phone]

## 2019-09-04 NOTE — Procedures (Signed)
Cortrak  Person Inserting Tube:  Maylon Peppers C, RD Tube Type:  Cortrak - 43 inches Tube Location:  Right nare Initial Placement:  Stomach Secured by: Bridle Technique Used to Measure Tube Placement:  Documented cm marking at nare/ corner of mouth Cortrak Secured At:  64 cm    Cortrak Tube Team Note:  Consult received to place a Cortrak feeding tube.   No x-ray is required. RN may begin using tube.    If the tube becomes dislodged please keep the tube and contact the Cortrak team at [email or web address] (password [email or web address]) for replacement.  If after hours and replacement cannot be delayed, place a NG tube and confirm placement with an abdominal x-ray.    Lockie Pares., RD, LDN, CNSC See AMiON for contact information

## 2019-09-04 NOTE — Evaluation (Signed)
Speech Language Pathology Evaluation Patient Details Name: [name] MRN: [id number] DOB: 1932/10/22 Today's Date: 09/04/2019 Time: 6295-2841 SLP Time Calculation (min) (ACUTE ONLY): 11 min  Problem List:  Patient Active Problem List   Diagnosis Date Noted  . Acute CVA (cerebrovascular accident) ([place]) 09/02/2019  . Acute ischemic right MCA stroke ([place]) 09/02/2019  . Acute respiratory failure ([place])   . Ventilator dependence ([place])   . HTN (hypertension) 06/21/2019  . Mild cognitive impairment 04/02/2016  . Falls 05/18/2015  . Atrial fibrillation ([place]) 10/30/2014  . Whiplash 10/16/2014  . Memory changes 10/16/2014  . Hemorrhage of rectum and anus 10/13/2013   Past Medical History:  Past Medical History:  Diagnosis Date  . Arthritis   . Atrial fibrillation ([place])   . Cataract    BILATERAL  . DDD (degenerative disc disease), cervical   . Depression   . Diverticulosis   . Falls   . GERD (gastroesophageal reflux disease)   . Glaucoma   . Headache   . Hyperlipidemia   . Hyperlipidemia   . Hypertension   . Osteoporosis   . Peripheral neuropathy   . Spondylosis    lumbar  . Subdural hematoma [facility])    Past Surgical History:  Past Surgical History:  Procedure Laterality Date  . APPENDECTOMY    . BACK SURGERY  2010  . BURR HOLE FOR SUBDURAL HEMATOMA     x 2 hematoma  . COLONOSCOPY    . IR CT HEAD LTD  09/04/2019  . IR PERCUTANEOUS ART THROMBECTOMY/INFUSION INTRACRANIAL INC DIAG ANGIO  09/02/2019      . IR PERCUTANEOUS ART THROMBECTOMY/INFUSION INTRACRANIAL INC DIAG ANGIO  09/04/2019  . KNEE SURGERY Bilateral   . RADIOLOGY WITH ANESTHESIA N/A 09/02/2019   Procedure: IR WITH ANESTHESIA;  Surgeon: Radiologist, Medication, MD;  Location: [facility];  Service: Radiology;  Laterality: N/A;  . VAGINAL HYSTERECTOMY     HPI:  86 year old female with past medical history of A. fib not on anticoagulation due to frequent falling and prior subdural hematoma, hypertension, memory loss,  GERD who was admitted to the hospital for altered mental status, found to have a left MCA stroke and is now status post thrombectomy. Extubated 8/16.    Assessment / Plan / Recommendation Clinical Impression  Pt has expressive and receptive aphasia, but starting to verbalize more at the word level. She stated her name and responded to yes/no questions with a little over 50% accuracy. Min cues and extra time were provided for pt to follow simple commands. She can repeat well even up to the sentence level, but other verbal expression is impacted by perseverative errors and phonemic paraphasias. She participated in automatic speech tasks with Mod I. Her intelligibility of speech is also impacted by dysphonia s/p extubation and R-sided facial weakness. Recommend SLP f/u acutely and at CIR level upon discharge.     SLP Assessment  SLP Recommendation/Assessment: Patient needs continued Speech Lanaguage Pathology Services SLP Visit Diagnosis: Aphasia (R47.01)    Follow Up Recommendations  Inpatient Rehab    Frequency and Duration min 2x/week  2 weeks      SLP Evaluation Cognition  Overall Cognitive Status: Difficult to assess Arousal/Alertness: Awake/alert Orientation Level: Oriented to person Attention: Sustained Sustained Attention: Appears intact (simple, functional tasks) Awareness: Impaired Awareness Impairment: Emergent impairment       Comprehension  Auditory Comprehension Overall Auditory Comprehension: Impaired Yes/No Questions: Impaired Basic Biographical Questions: 51-75% accurate Basic Immediate Environment Questions: 50-74% accurate Complex Questions: 50-74% accurate Commands:  Impaired One Step Basic Commands: 75-100% accurate    Expression Expression Primary Mode of Expression: Verbal Verbal Expression Overall Verbal Expression: Impaired Initiation: No impairment Automatic Speech: Name;Counting;Day of week;Social Response Level of Generative/Spontaneous  Verbalization: Word (primarily single words) Repetition: No impairment Naming: Impairment Confrontation: Impaired Verbal Errors: Perseveration;Not aware of errors;Phonemic paraphasias Non-Verbal Means of Communication: Not applicable Written Expression Dominant Hand: Right   Oral / Motor  Oral Motor/Sensory Function Overall Oral Motor/Sensory Function: Moderate impairment Facial Symmetry: Abnormal symmetry right;Suspected CN VII (facial) dysfunction Facial Strength: Reduced right;Suspected CN VII (facial) dysfunction Facial Sensation: Reduced right;Suspected CN V (Trigeminal) dysfunction Motor Speech Overall Motor Speech: Impaired Respiration: Within functional limits Phonation: Low vocal intensity Resonance: Within functional limits Articulation: Impaired Level of Impairment: Word   GO                    Osie Bond., M.A. [place] [facility] Pager [phone] Office [phone]  09/04/2019, 4:34 PM

## 2019-09-04 NOTE — Progress Notes (Signed)
STROKE TEAM PROGRESS NOTE   INTERVAL HISTORY Patient was just extubated this morning.  She is awake but is aphasic with right hemiplegia.  She has gurgling secretions and weak cough.  Vital signs are stable  OBJECTIVE Vitals:   09/04/19 0752 09/04/19 0800 09/04/19 0900 09/04/19 0925  BP:  (!) 160/86 (!) 163/90   Pulse:  75 74 90  Resp:  14 13 20   Temp:  98.9 F (37.2 C)    TempSrc:  Oral    SpO2: 100% 100% 100% 100%  Weight:      Height:       CBC:  Recent Labs  Lab 09/02/19 1130 09/02/19 1130 09/02/19 1134 09/03/19 0319  WBC 8.4  --   --  11.8*  NEUTROABS 6.7  --   --   --   HGB 14.9   < > 15.6* 13.3  HCT 47.0*   < > 46.0 40.4  MCV 99.8  --   --  97.1  PLT 232  --   --  225   < > = values in this interval not displayed.   Basic Metabolic Panel:  Recent Labs  Lab 09/02/19 1130 09/02/19 1130 09/02/19 1134 09/03/19 0319  NA 134*   < > 136 138  K 4.6   < > 4.3 4.2  CL 104   < > 104 106  CO2 18*  --   --  21*  GLUCOSE 136*   < > 138* 136*  BUN 22   < > 27* 16  CREATININE 0.85   < > 0.80 0.85  CALCIUM 9.5  --   --  9.2   < > = values in this interval not displayed.   Lipid Panel:     Component Value Date/Time   CHOL 206 (H) 09/03/2019 0319   TRIG 230 (H) 09/03/2019 0319   HDL 41 09/03/2019 0319   CHOLHDL 5.0 09/03/2019 0319   VLDL 46 (H) 09/03/2019 0319   LDLCALC 119 (H) 09/03/2019 0319   HgbA1c:  Lab Results  Component Value Date   HGBA1C 5.8 (H) 09/03/2019   IMAGING  CT HEAD CODE STROKE WO CONTRAST CT ANGIO HEAD CODE STROKE CT ANGIO NECK CODE STROKE CT CEREBRAL PERFUSION W CONTRAST 09/02/2019 No acute intracranial hemorrhage. 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 ASPECT score of 8. There is nearly occlusive thrombus within the proximal left M1 MCA extending into an M2 branch. Perfusion imaging demonstrates core infarction of 4 mL (corresponds to noncontrast CT finding) and penumbra of 83 mL in the left MCA territory.    DIAGNOSTIC CEREBRAL ANGIOGRAM AND MECHANICAL THROMBECTOMY Dr. Erven Colla de Sindy Messing Proximal left M1/MCA occlusion. 3 passes performed with aspiration + stent retriever (embotrap 69mm) with complete recanalization. No embolus to new or distal territory. Small right sylvian fissure SAH on post procedural flat panel CT. PLAN:  - Transfer to ICU - Maintain intubated as no covid-19 test result available - SBP 120-140 mmHg - Bed rest post femoral access x6 hours  MRI Brain 09/03/2019 Acute infarcts of the left MCA territory involving the basal ganglia and adjacent white matter and parietotemporal lobes. Petechial reperfusion hemorrhage primarily involving the basal ganglia and small volume subarachnoid hemorrhage as seen on CT. Advanced chronic microvascular ischemic changes.   CT HEAD WO CONTRAST 09/03/2019 New hyperdensity of the previously hypodense left basal ganglia may reflect petechial reperfusion hemorrhage or contrast staining. Hyperdensity along the left MCA and sylvian fissure likely reflects small volume subarachnoid hemorrhage.  Transthoracic Echocardiogram  09/03/2019 1. Left ventricular ejection fraction, by estimation, is 60 to 65%. The left ventricle has normal function. The left ventricle has no regional wall motion abnormalities. There is mild concentric left ventricular hypertrophy. Left ventricular diastolic function could not be evaluated.   2. Right ventricular systolic function is normal. The right ventricular size is moderately enlarged.   3. The mitral valve is degenerative. Trivial mitral valve regurgitation. No evidence of mitral stenosis.  4. The aortic valve is tricuspid. Aortic valve regurgitation is not visualized. Mild aortic valve stenosis.  Conclusion(s)/Recommendation(s): Patient with stroke and atrial fibrillation. No intracardiac source of embolism identified other than atrial fibrilliation rhythm.   ECG - atrial fibrillation - ventricular response 92  BPM (See cardiology reading for complete details)   PHYSICAL EXAM  Pleasant elderly Caucasian lady  Not in distress. . Afebrile. Head is nontraumatic. Neck is supple without bruit.    Cardiac exam no murmur or gallop. Lungs are clear to auscultation. Distal pulses are well felt.  She is wearing a neck collar Neurological Exam : Patient is awake and alert.  She appears to be aphasic and does not follow commands consistently.  Has left gaze preference and she will not look to the right past midline.  She blinks to threat on the left but not on the right.  Right lower facial weakness.  Tongue midline.  She moves left upper and lower extremities purposefully against gravity.  Right upper extremity is flaccid and only trace withdrawal to pain.  She does withdraw right lower extremity pain but not voluntarily.  Deep tendon reflexes are depressed on the right and present on the left right plantar upgoing left downgoing.  Gait not tested   ASSESSMENT/PLAN [name] [name] is a 86 y.o. female with history of atrial fibrillation (not on anticoagulation secondary to prior subdural hematoma), hypertension, hyperlipidemia, heart disease, mild cognitive impairment of vascular etiology complicated by alcohol use presenting with speech disturbance, left-sided gaze deviation and nonrhythmic shaking of the left side. She did not receive IV t-PA due to late presentation. IR - Proximal left M1/MCA occlusion. 3 passes aspiration + stent retriever with complete recanalization. Small right sylvian fissure SAH post procedure.  Stroke:  Left MCA territory acute infarction s/p IR w/ SAH - embolic - atrial fibrillation  Resultant aphasia and right hemiplegia  Code Stroke CT Head -  Left MCA territory acute infarction involving the basal ganglia. ASPECT score of 8.    CTA H&N - nearly occlusive thrombus within the proximal left M1 MCA extending into an M2 branch  CT Perfusion - core infarction of 4 mL (corresponds to  noncontrast CT finding) and penumbra of 83 mL in the left MCA territory  CT head - Small right sylvian fissure SAH on post procedural flat panel CT.  MRI head - L MCA infarcts w/ L basal ganglia petechial hemorrhage or contrast staining, SAH  2D Echo - AF, EF 60-65%, no other SOE  [employer] Virus 2 - negative  LDL - 119  HgbA1c - 5.8  VTE prophylaxis - SCDs    No antithrombotic prior to admission, add aspirin 300 supp vs 81 per tube daily (ok in setting of current [facility])  Therapy recommendations:  SNF (too low level for CIR per admission coordinator)  Disposition:  Pending  Acute Respiratory Failure  Secondary to stroke  Intubated for IR  Extubated today   Tolerating well so far   CCM on board  Atrial Fibrillation  Home  anticoagulation:  none, hx SDH   Hypertension  Home BP meds: metoprolol ; Norvasc ; Cozaar  Current BP meds: norvasc 10, cozaar 25, metoprolol 50 bid    Stable . BP goal < 160  . Long-term BP goal normotensive  Hyperlipidemia  Home Lipid lowering medication: Zocor 20 mg daily  LDL 119, goal < 70  Current lipid lowering medication: changed to Lipitor 40 mg   Continue statin at discharge  Dysphagia . Secondary to stroke . NPO  . Add cortrak and TF today . Speech on board   Other Stroke Risk Factors  Advanced age  Former cigarette smoker - quit  ETOH abuse, advised to drink no more than 1 alcoholic beverage per day.  Obesity, Body mass index is 30.93 kg/m., recommend weight loss, diet and exercise as appropriate   Coronary artery disease  Other Active Problems  Hx of mild cognitive impairment  ETOH - CIWA  GERD   Mild Leukocytosis - WBCs - 8.4->11.8 (afebrile)  Urinary retention, I&O cath x 2 thus far, foley w/ next one  Hospital day # 2 Plan continue ongoing speech therapy for swallow and physical occupational therapy.  May need to pass pended tube as I do not expect swallowing function to improve quickly.   Mobilize out of bed.  CT scan of the head tomorrow morning and if petechial hemorrhage is stable we will start aspirin.  Long discussion at bedside with the patient and her daughter and Dr. Tacy Learn and answered questions. This patient is critically ill and at significant risk of neurological worsening, death and care requires constant monitoring of vital signs, hemodynamics,respiratory and cardiac monitoring, extensive review of multiple databases, frequent neurological assessment, discussion with family, other specialists and medical decision making of high complexity.I have made any additions or clarifications directly to the above note.This critical care time does not reflect procedure time, or teaching time or supervisory time of PA/NP/Med Resident etc but could involve care discussion time.  I spent 30 minutes of neurocritical care time  in the care of  this patient.   Antony Contras, MD  To contact Stroke Continuity provider, please refer to [email or web address] After hours, contact General Neurology

## 2019-09-04 NOTE — Progress Notes (Signed)
Referring Physician(s): Dr. Curly Shores  Supervising Physician: Pedro Earls  Patient Status:  [facility] - In-pt  Chief Complaint: CODE STROKE  Subjective: Extubated this AM.  Sluggish to respond. Follows commands intermittently.  Does not vocalize, but makes slow attempts.  Moving left side.  Little movement with RLE, none with RUE.  Slight R facial droop.   Allergies: Codeine  Medications: Prior to Admission medications   Medication Sig Start Date End Date Taking? Authorizing Provider  acetaminophen (TYLENOL) 650 MG CR tablet Take 650 mg by mouth 2 (two) times daily.    Yes [provider]  amLODipine (NORVASC) 10 MG tablet Take 10 mg by mouth daily.  04/23/17  Yes [provider]  APPLE CIDER VINEGAR PO Take 1,200 mg by mouth daily.   Yes [provider]  Ascorbic Acid (VITAMIN C) 1000 MG tablet Take 1,000 mg by mouth daily.   Yes [provider]  Biotin 5000 MCG TABS Take 5,000 mcg by mouth daily.   Yes [provider]  Cholecalciferol (VITAMIN D3 SUPER STRENGTH) 50 MCG (2000 UT) TABS Take 2,000 Units by mouth daily.   Yes [provider]  clobetasol (TEMOVATE) 0.05 % external solution Apply 1 application topically daily as needed (scalp itching).   Yes [provider]  diphenhydrAMINE HCl, Sleep, (ZZZQUIL PO) Take 3 capsules by mouth at bedtime.   Yes [provider]  donepezil (ARICEPT) 10 MG tablet Take 10 mg by mouth at bedtime.   Yes [provider]  latanoprost (XALATAN) 0.005 % ophthalmic solution Place 1 drop into both eyes at bedtime.   Yes [provider]  loratadine (CLARITIN) 10 MG tablet Take 10 mg by mouth daily as needed for itching.    Yes [provider]  losartan (COZAAR) 25 MG tablet Take 25 mg by mouth daily.   Yes [provider]  Melatonin 10 MG TABS Take 30 mg by mouth at bedtime.    Yes [provider]  meloxicam (MOBIC) 15 MG  tablet Take 15 mg by mouth daily as needed for pain.    Yes [provider]  metoprolol tartrate (LOPRESSOR) 50 MG tablet Take 50 mg by mouth 2 (two) times daily.    Yes [provider]  omeprazole (PRILOSEC) 40 MG capsule Take 40 mg by mouth daily.  09/08/18  Yes [provider]  OVER THE COUNTER MEDICATION Take 2 tablets by mouth daily. "Sugar Bear Hair"   Yes [provider]  Psyllium (METAMUCIL PO) Take 1 Dose by mouth daily as needed (constipation).   Yes [provider]  Sennosides (EX-LAX PO) Take 1 tablet by mouth daily as needed (constipation).   Yes [provider]  sertraline (ZOLOFT) 50 MG tablet Take 50 mg by mouth daily.  09/08/18  Yes [provider]  Simethicone (GAS-X PO) Take 1 tablet by mouth daily as needed (gas/bloating/flatulence).   Yes [provider]  simvastatin (ZOCOR) 20 MG tablet Take 20 mg by mouth at bedtime.    Yes [provider]  timolol (TIMOPTIC) 0.5 % ophthalmic solution Place 1 drop into both eyes daily.   Yes [provider]  triamcinolone lotion (KENALOG) 0.1 % Apply 1 application topically daily as needed (itching).   Yes [provider]  TURMERIC PO Take 200 mg by mouth daily.   Yes [provider]  vitamin B-12 (CYANOCOBALAMIN) 1000 MCG tablet Take 3,000 mcg by mouth daily.   Yes [provider]  Vital Signs: BP (!) 173/82   Pulse 82   Temp 98.9 F (37.2 C) (Oral)   Resp 14   Ht _0  (1.651 m)   Wt 185 lb 13.6 oz (84.3 kg)   SpO2 100%   BMI 30.93 kg/m   Physical Exam  NAD, alert but not oriented.  Slow to respond, no vocalization but does make attempts. Right facial droop.   No movement noted with RUE.  Attempts to move the RLE, strength 0/5.  Groin soft, no evidence of hematoma or pseudoaneurysm.  Pulses: distal pulses identified by doppler.   Imaging: CT HEAD WO CONTRAST  Result Date: 09/03/2019 CLINICAL DATA:  Left MCA  stroke post thrombectomy EXAM: CT HEAD WITHOUT CONTRAST TECHNIQUE: Contiguous axial images were obtained from the base of the skull through the vertex without intravenous contrast. COMPARISON:  09/02/2019 FINDINGS: Brain: There is new hyperdensity of the left basal ganglia. There is also new hyperdensity about the left MCA and left sylvian fissure. No new loss of gray-white differentiation. Ventricles are stable in size. Stable findings of advanced chronic microvascular ischemic changes. No extra-axial fluid collection. No significant mass effect. Vascular: Left MCA is not well evaluated but does not appear as dense. Skull: Right burr holes.  Otherwise unremarkable. Sinuses/Orbits: Nonspecific paranasal sinus mucosal thickening. No acute orbital finding. Other: None. IMPRESSION: New hyperdensity of the previously hypodense left basal ganglia may reflect petechial reperfusion hemorrhage or contrast staining. Hyperdensity along the left MCA and sylvian fissure likely reflects small volume subarachnoid hemorrhage. Electronically Signed   By: Macy Mis M.D.   On: 09/03/2019 09:31   MR BRAIN WO CONTRAST  Result Date: 09/03/2019 CLINICAL DATA:  Left MCA stroke post thrombectomy EXAM: MRI HEAD WITHOUT CONTRAST TECHNIQUE: Multiplanar, multiecho pulse sequences of the brain and surrounding structures were obtained without intravenous contrast. COMPARISON:  Recent CT imaging, MRI brain 2018 FINDINGS: Brain: There is reduced diffusion involving the left basal ganglia and adjacent white matter, a small area within the anteromedial left temporal lobe, and small areas of the parietal and posterior left temporal lobes. There is corresponding susceptibility primarily in the basal ganglia region reflecting petechial reperfusion hemorrhage. Diffusion hyperintensity and susceptibility along the left MCA cistern and sylvian fissure are compatible with subarachnoid hemorrhage. There is trace layering intraventricular hemorrhage  in the occipital horns likely reflecting redistribution of the subarachnoid hemorrhage. Prominence of the ventricles and sulci reflects generalized parenchymal volume loss. Confluent areas of T2 hyperintensity in the supratentorial greater than pontine white matter are nonspecific but probably reflect advanced chronic microvascular ischemic changes. There is no significant mass effect. Vascular: Major vessel flow voids at the skull base are preserved. Skull and upper cervical spine: Normal marrow signal is preserved. Sinuses/Orbits: Mild mucosal thickening. Bilateral lens replacements. Other: Sella is unremarkable.  Mastoid air cells are clear. IMPRESSION: Acute infarcts of the left MCA territory involving the basal ganglia and adjacent white matter and parietotemporal lobes. Petechial reperfusion hemorrhage primarily involving the basal ganglia and small volume subarachnoid hemorrhage as seen on CT. Advanced chronic microvascular ischemic changes. Electronically Signed   By: Macy Mis M.D.   On: 09/03/2019 14:35   IR CT Head Ltd  Result Date: 09/04/2019 INDICATION: 86 year old female with past medical history significant for atrial fibrillation (not on anticoagulation due to prior subdural hematoma), hypertension, hyperlipidemia, heart disease, mild cognitive impairment of vascular etiology complicated by alcohol use with a premorbid Rankin scale of 0. She presented to ED with aphasia, left gaze deviation and nonrhythmic  shaking of the left side, NIHSS 22. She was last known well at 2:30 p.m. on 09/01/2019. No IV tPA administered as patient was outside of the window. Head CT showed prominent white matter disease and atrophy with hyperdense left MCA without large acute infarct or hemorrhage. CT angiogram of the head and neck showed a proximal left M1/MCA occlusion extending into the M2 segment. CT perfusion showed a core infarct of 4 mL with a penumbra of 83 mL. She was then transferred to our service for a  diagnostic cerebral angiogram and emergency mechanical thrombectomy. EXAM: DIAGNOSTIC CEREBRAL ANGIOGRAM MECHANICAL THROMBECTOMY FLAT PANEL HEAD CT COMPARISON:  CT/CT angiogram of the head and neck September 02, 2019 MEDICATIONS: No antibiotics given. ANESTHESIA/SEDATION: The procedure was performed in the general anesthesia. FLUOROSCOPY TIME:  Fluoroscopy Time: 31 minutes 12 seconds (678 mGy). COMPLICATIONS: None immediate. TECHNIQUE: Informed written consent was obtained from the patient's daughter after a thorough discussion of the procedural risks, benefits and alternatives. All questions were addressed. Maximal Sterile Barrier Technique was utilized including caps, mask, sterile gowns, sterile gloves, sterile drape, hand hygiene and skin antiseptic. A timeout was performed prior to the initiation of the procedure. The right groin was prepped and draped in the usual sterile fashion. Using a micropuncture kit and the modified Seldinger technique, access was gained to the right common femoral artery and an 8 French sheath was placed. Under fluoroscopy, an 8 [place] Walrus balloon guide catheter was navigated over a 6 [place] VTK catheter and a 0.035" Terumo Glidewire into the aortic arch. The catheter was placed into the left common carotid artery and then advanced into the left internal carotid artery. The inner catheter was removed. Frontal and lateral angiograms of the head were obtained. FINDINGS: Occlusion of the left middle cerebral artery at its origin. Fair collateral flow is seen from the left ACA to the left MCA vascular tree. PROCEDURE: Under biplane roadmap, a zoom 71 aspiration catheter was navigated over a phenom 21 microcatheter and a synchro support microguidewire into the cavernous segment of the right ICA. The microcatheter was then navigated over the wire into the left M2/MCA inferior division branch. Then, a 5 x 37 mm embotrap stent retriever was deployed spanning the M1 and M2 segments. The device  was allowed to intercalated with the clot for 4 minutes. The microcatheter was removed. The aspiration catheter was advanced to the level of occlusion and connected to a penumbra aspiration pump. The guiding catheter balloon was inflated. The thrombectomy device and aspiration catheter were removed under constant aspiration. Follow-up left ICA angiogram showed recanalization of most of the M1 segment with occlusion at the level of the bifurcation. Under biplane roadmap, a zoom 71 aspiration catheter was navigated over a phenom 21 microcatheter and a synchro support microguidewire into the cavernous segment of the right ICA. The microcatheter was then navigated over the wire into the left M2/MCA superior division branch. Then, a 5 x 37 mm embotrap stent retriever was deployed spanning the M1 and M2 segments. The device was allowed to intercalated with the clot for 4 minutes. The microcatheter was removed. The aspiration catheter was advanced to the level of occlusion and connected to a penumbra aspiration pump. The guiding catheter balloon was inflated. The thrombectomy device and aspiration catheter were removed under constant aspiration. Follow-up left ICA angiogram showed recanalization of the M1 segment and superior division with persistent occlusion of the inferior division. Under biplane roadmap, a zoom 71 aspiration catheter was navigated over a phenom 21  microcatheter and a synchro support microguidewire into the cavernous segment of the right ICA. The microcatheter was then navigated over the wire into the left M2/MCA inferior division branch. Then, a 5 x 37 mm embotrap stent retriever was deployed spanning the M1 and M2 segments. The device was allowed to intercalated with the clot for 4 minutes. The microcatheter was removed. The aspiration catheter was advanced to the level of occlusion and connected to a penumbra aspiration pump. The guiding catheter balloon was inflated. The thrombectomy device and  aspiration catheter were removed under constant aspiration. Follow-up left ICA angiogram showed complete recanalization of the left MCA vascular tree (TICI3). Flat panel CT of the head was obtained and post processed in a separate workstation with concurrent attending physician supervision. Selected images were sent to PACS. Contrast staining in the left basal ganglia was noted. Small subarachnoid hemorrhage versus contrast extravasation in the left sylvian fissure. Delayed follow-up angiogram showed persistent patency of the left MCA vascular tree with no evidence of embolus to new or distal territory. The catheter was subsequently withdrawn. Right femoral artery angiograms were obtained with right anterior oblique and lateral views. The puncture is at the level of the common femoral artery. The femoral sheath was exchanged over the wire for a Perclose ProGlide which was utilized for access closure. Immediate hemostasis was achieved. IMPRESSION: 1. Successful mechanical thrombectomy for treatment of a left M1/MCA proximal occlusion. Three passes performed with combined technique (aspiration left stent retriever) with complete recanalization. 2. No embolus to new or distal territory. 3. Small left sylvian subarachnoid hemorrhage versus process extravasation. PLAN: - Transfer to ICU- Maintain intubated as no covid-19 test result available- SBP 120-140 mmHg- Bed rest post femoral access x6 hours Electronically Signed   By: Pedro Earls M.D.   On: 09/04/2019 13:09   CT CEREBRAL PERFUSION W CONTRAST  Result Date: 09/02/2019 CLINICAL DATA:  Code stroke EXAM: CT HEAD WITHOUT CONTRAST CT ANGIOGRAPHY HEAD AND NECK CT PERFUSION BRAIN TECHNIQUE: Multidetector CT imaging of the head and neck was performed using the standard protocol during bolus administration of intravenous contrast. Multiplanar CT image reconstructions and MIPs were obtained to evaluate the vascular anatomy. Carotid stenosis measurements  (when applicable) are obtained utilizing NASCET criteria, using the distal internal carotid diameter as the denominator. Multiphase CT imaging of the brain was performed following IV bolus contrast injection. Subsequent parametric perfusion maps were calculated using RAPID software. CONTRAST:  136m OMNIPAQUE IOHEXOL 350 MG/ML SOLN COMPARISON:  2020 head CT FINDINGS: Brain: There is no acute intracranial hemorrhage. Suspected new hypoattenuation of the body of caudate nucleus and posterior lentiform nucleus on the left. Prominence of the ventricles and sulci reflects generalized parenchymal volume. Confluent areas of hypoattenuation in the supratentorial white matter nonspecific but probably reflect advanced chronic microvascular ischemic changes. There is no extra-axial fluid collection. Vascular: Hyperdensity of the left MCA. Skull: Small right burr holes. Sinuses/Orbits: No acute finding. Other: Mastoid air cells are clear. ASPECTS (ARamonaStroke Program Early CT Score) - Ganglionic level infarction (caudate, lentiform nuclei, internal capsule, insula, M1-M3 cortex): 5 - Supraganglionic infarction (M4-M6 cortex): 3 Total score (0-10 with 10 being normal): 8 CTA NECK FINDINGS Aortic arch: Calcified and noncalcified plaque along the arch and patent great vessel origins. Right carotid system: Patent. Mild calcified plaque at the ICA origin causing minimal stenosis. Left carotid system: Patent. Trace calcified plaque at the ICA origin without measurable stenosis. Vertebral arteries: Patent. Left vertebral artery is slightly dominant. Minor calcified plaque  is present. Skeleton: Multilevel degenerative changes of the cervical spine. Other neck: No mass or adenopathy. Upper chest: No apical lung mass. Review of the MIP images confirms the above findings CTA HEAD FINDINGS Anterior circulation: Intracranial internal carotid arteries are patent with trace calcified plaque. There is nearly occlusive thrombus within the  proximal left M1 MCA with faint contrast enhancement more distally extending into a M2 branch. Diminished flow within distal left MCA branches. Right middle cerebral and both anterior cerebral arteries are patent. Posterior circulation: Intracranial vertebral, basilar, and posterior cerebral arteries are patent. There are bilateral posterior communicating arteries present. Venous sinuses: As permitted by contrast timing, patent. Review of the MIP images confirms the above findings CT Brain Perfusion Findings: CBF (<30%) Volume: 35m Perfusion (Tmax>6.0s) volume: 870mMismatch Volume: 8332mnfarction Location: Left basal ganglia corresponding to noncontrast CT findings. Area of penumbra is within the left MCA territory. IMPRESSION: No acute intracranial hemorrhage. 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 ASPECT score of 8. There is nearly occlusive thrombus within the proximal left M1 MCA extending into an M2 branch. Perfusion imaging demonstrates core infarction of 4 mL (corresponds to noncontrast CT finding) and penumbra of 83 mL in the left MCA territory. Initial results were called by telephone at the time of interpretation on 09/02/2019 at 11:43 am to provider Dr. BhaCurly Shoresho verbally acknowledged these results. CTA/CTP results were communicated at 11:55 amon 8/14/2021by text page via the AM[facility] Centers[facility]aging system. Electronically Signed   By: PraMacy MisD.   On: 09/02/2019 12:05   DG Abd Portable 1V  Result Date: 09/03/2019 CLINICAL DATA:  Evaluate enteric tube. EXAM: PORTABLE ABDOMEN - 1 VIEW COMPARISON:  None. FINDINGS: Enteric tube tip and side-port project over the stomach. Unremarkable bowel gas pattern. Lumbar spinal hardware. IMPRESSION: Enteric tube tip and side-port project over the stomach. Electronically Signed   By: DreLovey NewcomerD.   On: 09/03/2019 13:26   ECHOCARDIOGRAM COMPLETE  Result Date: 09/03/2019    ECHOCARDIOGRAM REPORT   Patient Name:   Cynthia CRESWELLte  of Exam: 09/03/2019 Medical Rec #:  [id number]   Height:       65.0 in Accession #:    [id number]  Weight:       185.8 lb Date of Birth:  12/January 31, 1932  BSA:          1.917 m Patient Age:    [age]       BP:           142/84 mmHg Patient Gender: F              HR:           79 bpm. Exam Location:  Inpatient Procedure: 2D Echo Indications:    434.91 stroke  History:        Patient has prior history of Echocardiogram examinations.                 Arrythmias:Atrial Fibrillation; Risk Factors:Hypertension,                 Dyslipidemia and Former Smoker.  Sonographer:    VijJannett CelestineCS (AE) Referring Phys: [id number]. Left ventricular ejection fraction, by estimation, is 60 to 65%. The left ventricle has normal function. The left ventricle has no regional wall motion abnormalities. There is mild concentric left ventricular hypertrophy. Left ventricular diastolic function could not be evaluated.  2. Right ventricular systolic function is normal. The right ventricular size is moderately enlarged.  3. The mitral valve is degenerative. Trivial mitral valve regurgitation. No evidence of mitral stenosis.  4. The aortic valve is tricuspid. Aortic valve regurgitation is not visualized. Mild aortic valve stenosis. Conclusion(s)/Recommendation(s): Patient with stroke and atrial fibrillation. No intracardiac source of embolism identified other than atrial fibrilliation rhythm. FINDINGS  Left Ventricle: Left ventricular ejection fraction, by estimation, is 60 to 65%. The left ventricle has normal function. The left ventricle has no regional wall motion abnormalities. The left ventricular internal cavity size was normal in size. There is  mild concentric left ventricular hypertrophy. Left ventricular diastolic function could not be evaluated due to atrial fibrillation. Left ventricular diastolic function could not be evaluated. Right Ventricle: The right ventricular size is moderately enlarged.  No increase in right ventricular wall thickness. Right ventricular systolic function is normal. Left Atrium: Left atrial size was normal in size. Right Atrium: Right atrial size was normal in size. Pericardium: Trivial pericardial effusion is present. Presence of pericardial fat pad. Mitral Valve: The mitral valve is degenerative in appearance. Mild mitral annular calcification. Trivial mitral valve regurgitation. No evidence of mitral valve stenosis. Tricuspid Valve: The tricuspid valve is grossly normal. Tricuspid valve regurgitation is mild . No evidence of tricuspid stenosis. Aortic Valve: The aortic valve is tricuspid. . There is moderate thickening and moderate calcification of the aortic valve. Aortic valve regurgitation is not visualized. Mild aortic stenosis is present. There is moderate thickening of the aortic valve. There is moderate calcification of the aortic valve. Aortic valve mean gradient measures 9.0 mmHg. Aortic valve peak gradient measures 14.0 mmHg. Aortic valve area, by VTI measures 1.93 cm. Pulmonic Valve: The pulmonic valve was grossly normal. Pulmonic valve regurgitation is trivial. No evidence of pulmonic stenosis. Aorta: The aortic root and ascending aorta are structurally normal, with no evidence of dilitation. Venous: IVC assessment for right atrial pressure unable to be performed due to mechanical ventilation. IAS/Shunts: The atrial septum is grossly normal.  LEFT VENTRICLE PLAX 2D LVIDd:         3.70 cm LVIDs:         2.40 cm LV PW:         1.00 cm LV IVS:        1.30 cm LVOT diam:     2.20 cm LV SV:         66 LV SV Index:   34 LVOT Area:     3.80 cm  RIGHT VENTRICLE RV S prime:     13.40 cm/s TAPSE (M-mode): 1.9 cm LEFT ATRIUM             Index       RIGHT ATRIUM           Index LA diam:        3.60 cm 1.88 cm/m  RA Area:     16.20 cm LA Vol (A2C):   58.0 ml 30.25 ml/m RA Volume:   39.70 ml  20.71 ml/m LA Vol (A4C):   35.2 ml 18.36 ml/m LA Biplane Vol: 45.3 ml 23.63 ml/m   AORTIC VALVE AV Area (Vmax):    2.07 cm AV Area (Vmean):   1.70 cm AV Area (VTI):     1.93 cm AV Vmax:           187.00 cm/s AV Vmean:          142.000 cm/s AV VTI:  0.343 m AV Peak Grad:      14.0 mmHg AV Mean Grad:      9.0 mmHg LVOT Vmax:         102.00 cm/s LVOT Vmean:        63.600 cm/s LVOT VTI:          0.174 m LVOT/AV VTI ratio: 0.51  AORTA Ao Root diam: 3.20 cm MITRAL VALVE                TRICUSPID VALVE MV Area (PHT): 4.39 cm     TR Peak grad:   35.0 mmHg MV Decel Time: 173 msec     TR Vmax:        296.00 cm/s MV E velocity: 118.00 cm/s                             SHUNTS                             Systemic VTI:  0.17 m                             Systemic Diam: 2.20 cm Eleonore Chiquito MD Electronically signed by Eleonore Chiquito MD Signature Date/Time: 09/03/2019/11:14:08 AM    Final    IR PERCUTANEOUS ART THROMBECTOMY/INFUSION INTRACRANIAL INC DIAG ANGIO  Result Date: 09/04/2019 INDICATION: 86 year old female with past medical history significant for atrial fibrillation (not on anticoagulation due to prior subdural hematoma), hypertension, hyperlipidemia, heart disease, mild cognitive impairment of vascular etiology complicated by alcohol use with a premorbid Rankin scale of 0. She presented to ED with aphasia, left gaze deviation and nonrhythmic shaking of the left side, NIHSS 22. She was last known well at 2:30 p.m. on 09/01/2019. No IV tPA administered as patient was outside of the window. Head CT showed prominent white matter disease and atrophy with hyperdense left MCA without large acute infarct or hemorrhage. CT angiogram of the head and neck showed a proximal left M1/MCA occlusion extending into the M2 segment. CT perfusion showed a core infarct of 4 mL with a penumbra of 83 mL. She was then transferred to our service for a diagnostic cerebral angiogram and emergency mechanical thrombectomy. EXAM: DIAGNOSTIC CEREBRAL ANGIOGRAM MECHANICAL THROMBECTOMY FLAT PANEL HEAD CT COMPARISON:   CT/CT angiogram of the head and neck September 02, 2019 MEDICATIONS: No antibiotics given. ANESTHESIA/SEDATION: The procedure was performed in the general anesthesia. FLUOROSCOPY TIME:  Fluoroscopy Time: 31 minutes 12 seconds (678 mGy). COMPLICATIONS: None immediate. TECHNIQUE: Informed written consent was obtained from the patient's daughter after a thorough discussion of the procedural risks, benefits and alternatives. All questions were addressed. Maximal Sterile Barrier Technique was utilized including caps, mask, sterile gowns, sterile gloves, sterile drape, hand hygiene and skin antiseptic. A timeout was performed prior to the initiation of the procedure. The right groin was prepped and draped in the usual sterile fashion. Using a micropuncture kit and the modified Seldinger technique, access was gained to the right common femoral artery and an 8 French sheath was placed. Under fluoroscopy, an 8 [place] Walrus balloon guide catheter was navigated over a 6 [place] VTK catheter and a 0.035" Terumo Glidewire into the aortic arch. The catheter was placed into the left common carotid artery and then advanced into the left internal carotid artery. The inner catheter was removed. Frontal and lateral angiograms of  the head were obtained. FINDINGS: Occlusion of the left middle cerebral artery at its origin. Fair collateral flow is seen from the left ACA to the left MCA vascular tree. PROCEDURE: Under biplane roadmap, a zoom 71 aspiration catheter was navigated over a phenom 21 microcatheter and a synchro support microguidewire into the cavernous segment of the right ICA. The microcatheter was then navigated over the wire into the left M2/MCA inferior division branch. Then, a 5 x 37 mm embotrap stent retriever was deployed spanning the M1 and M2 segments. The device was allowed to intercalated with the clot for 4 minutes. The microcatheter was removed. The aspiration catheter was advanced to the level of occlusion and  connected to a penumbra aspiration pump. The guiding catheter balloon was inflated. The thrombectomy device and aspiration catheter were removed under constant aspiration. Follow-up left ICA angiogram showed recanalization of most of the M1 segment with occlusion at the level of the bifurcation. Under biplane roadmap, a zoom 71 aspiration catheter was navigated over a phenom 21 microcatheter and a synchro support microguidewire into the cavernous segment of the right ICA. The microcatheter was then navigated over the wire into the left M2/MCA superior division branch. Then, a 5 x 37 mm embotrap stent retriever was deployed spanning the M1 and M2 segments. The device was allowed to intercalated with the clot for 4 minutes. The microcatheter was removed. The aspiration catheter was advanced to the level of occlusion and connected to a penumbra aspiration pump. The guiding catheter balloon was inflated. The thrombectomy device and aspiration catheter were removed under constant aspiration. Follow-up left ICA angiogram showed recanalization of the M1 segment and superior division with persistent occlusion of the inferior division. Under biplane roadmap, a zoom 71 aspiration catheter was navigated over a phenom 21 microcatheter and a synchro support microguidewire into the cavernous segment of the right ICA. The microcatheter was then navigated over the wire into the left M2/MCA inferior division branch. Then, a 5 x 37 mm embotrap stent retriever was deployed spanning the M1 and M2 segments. The device was allowed to intercalated with the clot for 4 minutes. The microcatheter was removed. The aspiration catheter was advanced to the level of occlusion and connected to a penumbra aspiration pump. The guiding catheter balloon was inflated. The thrombectomy device and aspiration catheter were removed under constant aspiration. Follow-up left ICA angiogram showed complete recanalization of the left MCA vascular tree (TICI3).  Flat panel CT of the head was obtained and post processed in a separate workstation with concurrent attending physician supervision. Selected images were sent to PACS. Contrast staining in the left basal ganglia was noted. Small subarachnoid hemorrhage versus contrast extravasation in the left sylvian fissure. Delayed follow-up angiogram showed persistent patency of the left MCA vascular tree with no evidence of embolus to new or distal territory. The catheter was subsequently withdrawn. Right femoral artery angiograms were obtained with right anterior oblique and lateral views. The puncture is at the level of the common femoral artery. The femoral sheath was exchanged over the wire for a Perclose ProGlide which was utilized for access closure. Immediate hemostasis was achieved. IMPRESSION: 1. Successful mechanical thrombectomy for treatment of a left M1/MCA proximal occlusion. Three passes performed with combined technique (aspiration left stent retriever) with complete recanalization. 2. No embolus to new or distal territory. 3. Small left sylvian subarachnoid hemorrhage versus process extravasation. PLAN: - Transfer to ICU- Maintain intubated as no covid-19 test result available- SBP 120-140 mmHg- Bed rest post femoral access  x6 hours Electronically Signed   By: Pedro Earls M.D.   On: 09/04/2019 13:09   CT HEAD CODE STROKE WO CONTRAST  Result Date: 09/02/2019 CLINICAL DATA:  Code stroke EXAM: CT HEAD WITHOUT CONTRAST CT ANGIOGRAPHY HEAD AND NECK CT PERFUSION BRAIN TECHNIQUE: Multidetector CT imaging of the head and neck was performed using the standard protocol during bolus administration of intravenous contrast. Multiplanar CT image reconstructions and MIPs were obtained to evaluate the vascular anatomy. Carotid stenosis measurements (when applicable) are obtained utilizing NASCET criteria, using the distal internal carotid diameter as the denominator. Multiphase CT imaging of the brain was  performed following IV bolus contrast injection. Subsequent parametric perfusion maps were calculated using RAPID software. CONTRAST:  148m OMNIPAQUE IOHEXOL 350 MG/ML SOLN COMPARISON:  2020 head CT FINDINGS: Brain: There is no acute intracranial hemorrhage. Suspected new hypoattenuation of the body of caudate nucleus and posterior lentiform nucleus on the left. Prominence of the ventricles and sulci reflects generalized parenchymal volume. Confluent areas of hypoattenuation in the supratentorial white matter nonspecific but probably reflect advanced chronic microvascular ischemic changes. There is no extra-axial fluid collection. Vascular: Hyperdensity of the left MCA. Skull: Small right burr holes. Sinuses/Orbits: No acute finding. Other: Mastoid air cells are clear. ASPECTS ([place] Program Early CT Score) - Ganglionic level infarction (caudate, lentiform nuclei, internal capsule, insula, M1-M3 cortex): 5 - Supraganglionic infarction (M4-M6 cortex): 3 Total score (0-10 with 10 being normal): 8 CTA NECK FINDINGS Aortic arch: Calcified and noncalcified plaque along the arch and patent great vessel origins. Right carotid system: Patent. Mild calcified plaque at the ICA origin causing minimal stenosis. Left carotid system: Patent. Trace calcified plaque at the ICA origin without measurable stenosis. Vertebral arteries: Patent. Left vertebral artery is slightly dominant. Minor calcified plaque is present. Skeleton: Multilevel degenerative changes of the cervical spine. Other neck: No mass or adenopathy. Upper chest: No apical lung mass. Review of the MIP images confirms the above findings CTA HEAD FINDINGS Anterior circulation: Intracranial internal carotid arteries are patent with trace calcified plaque. There is nearly occlusive thrombus within the proximal left M1 MCA with faint contrast enhancement more distally extending into a M2 branch. Diminished flow within distal left MCA branches. Right middle  cerebral and both anterior cerebral arteries are patent. Posterior circulation: Intracranial vertebral, basilar, and posterior cerebral arteries are patent. There are bilateral posterior communicating arteries present. Venous sinuses: As permitted by contrast timing, patent. Review of the MIP images confirms the above findings CT Brain Perfusion Findings: CBF (<30%) Volume: 463mPerfusion (Tmax>6.0s) volume: 8790mismatch Volume: 59m7mfarction Location: Left basal ganglia corresponding to noncontrast CT findings. Area of penumbra is within the left MCA territory. IMPRESSION: No acute intracranial hemorrhage. 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 ASPECT score of 8. There is nearly occlusive thrombus within the proximal left M1 MCA extending into an M2 branch. Perfusion imaging demonstrates core infarction of 4 mL (corresponds to noncontrast CT finding) and penumbra of 83 mL in the left MCA territory. Initial results were called by telephone at the time of interpretation on 09/02/2019 at 11:43 am to provider Dr. BhagCurly Shoreso verbally acknowledged these results. CTA/CTP results were communicated at 11:55 amon 8/14/2021by text page via the AM[facility]OL[facility]Csaging system. Electronically Signed   By: PranMacy Mis.   On: 09/02/2019 12:05   CT ANGIO HEAD CODE STROKE  Result Date: 09/02/2019 CLINICAL DATA:  Code stroke EXAM: CT HEAD WITHOUT CONTRAST CT ANGIOGRAPHY HEAD AND NECK CT  PERFUSION BRAIN TECHNIQUE: Multidetector CT imaging of the head and neck was performed using the standard protocol during bolus administration of intravenous contrast. Multiplanar CT image reconstructions and MIPs were obtained to evaluate the vascular anatomy. Carotid stenosis measurements (when applicable) are obtained utilizing NASCET criteria, using the distal internal carotid diameter as the denominator. Multiphase CT imaging of the brain was performed following IV bolus contrast injection. Subsequent parametric  perfusion maps were calculated using RAPID software. CONTRAST:  169m OMNIPAQUE IOHEXOL 350 MG/ML SOLN COMPARISON:  2020 head CT FINDINGS: Brain: There is no acute intracranial hemorrhage. Suspected new hypoattenuation of the body of caudate nucleus and posterior lentiform nucleus on the left. Prominence of the ventricles and sulci reflects generalized parenchymal volume. Confluent areas of hypoattenuation in the supratentorial white matter nonspecific but probably reflect advanced chronic microvascular ischemic changes. There is no extra-axial fluid collection. Vascular: Hyperdensity of the left MCA. Skull: Small right burr holes. Sinuses/Orbits: No acute finding. Other: Mastoid air cells are clear. ASPECTS ([place]roke Program Early CT Score) - Ganglionic level infarction (caudate, lentiform nuclei, internal capsule, insula, M1-M3 cortex): 5 - Supraganglionic infarction (M4-M6 cortex): 3 Total score (0-10 with 10 being normal): 8 CTA NECK FINDINGS Aortic arch: Calcified and noncalcified plaque along the arch and patent great vessel origins. Right carotid system: Patent. Mild calcified plaque at the ICA origin causing minimal stenosis. Left carotid system: Patent. Trace calcified plaque at the ICA origin without measurable stenosis. Vertebral arteries: Patent. Left vertebral artery is slightly dominant. Minor calcified plaque is present. Skeleton: Multilevel degenerative changes of the cervical spine. Other neck: No mass or adenopathy. Upper chest: No apical lung mass. Review of the MIP images confirms the above findings CTA HEAD FINDINGS Anterior circulation: Intracranial internal carotid arteries are patent with trace calcified plaque. There is nearly occlusive thrombus within the proximal left M1 MCA with faint contrast enhancement more distally extending into a M2 branch. Diminished flow within distal left MCA branches. Right middle cerebral and both anterior cerebral arteries are patent. Posterior  circulation: Intracranial vertebral, basilar, and posterior cerebral arteries are patent. There are bilateral posterior communicating arteries present. Venous sinuses: As permitted by contrast timing, patent. Review of the MIP images confirms the above findings CT Brain Perfusion Findings: CBF (<30%) Volume: 473mPerfusion (Tmax>6.0s) volume: 8749mismatch Volume: 42m58mfarction Location: Left basal ganglia corresponding to noncontrast CT findings. Area of penumbra is within the left MCA territory. IMPRESSION: No acute intracranial hemorrhage. 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 ASPECT score of 8. There is nearly occlusive thrombus within the proximal left M1 MCA extending into an M2 branch. Perfusion imaging demonstrates core infarction of 4 mL (corresponds to noncontrast CT finding) and penumbra of 83 mL in the left MCA territory. Initial results were called by telephone at the time of interpretation on 09/02/2019 at 11:43 am to provider Dr. BhagCurly Shoreso verbally acknowledged these results. CTA/CTP results were communicated at 11:55 amon 8/14/2021by text page via the AMIOBay [facility]alsaging system. Electronically Signed   By: PranMacy Mis.   On: 09/02/2019 12:05   CT ANGIO NECK CODE STROKE  Result Date: 09/02/2019 CLINICAL DATA:  Code stroke EXAM: CT HEAD WITHOUT CONTRAST CT ANGIOGRAPHY HEAD AND NECK CT PERFUSION BRAIN TECHNIQUE: Multidetector CT imaging of the head and neck was performed using the standard protocol during bolus administration of intravenous contrast. Multiplanar CT image reconstructions and MIPs were obtained to evaluate the vascular anatomy. Carotid stenosis measurements (when applicable) are obtained utilizing NASCET criteria,  using the distal internal carotid diameter as the denominator. Multiphase CT imaging of the brain was performed following IV bolus contrast injection. Subsequent parametric perfusion maps were calculated using RAPID software. CONTRAST:  125m  OMNIPAQUE IOHEXOL 350 MG/ML SOLN COMPARISON:  2020 head CT FINDINGS: Brain: There is no acute intracranial hemorrhage. Suspected new hypoattenuation of the body of caudate nucleus and posterior lentiform nucleus on the left. Prominence of the ventricles and sulci reflects generalized parenchymal volume. Confluent areas of hypoattenuation in the supratentorial white matter nonspecific but probably reflect advanced chronic microvascular ischemic changes. There is no extra-axial fluid collection. Vascular: Hyperdensity of the left MCA. Skull: Small right burr holes. Sinuses/Orbits: No acute finding. Other: Mastoid air cells are clear. ASPECTS ([place]roke Program Early CT Score) - Ganglionic level infarction (caudate, lentiform nuclei, internal capsule, insula, M1-M3 cortex): 5 - Supraganglionic infarction (M4-M6 cortex): 3 Total score (0-10 with 10 being normal): 8 CTA NECK FINDINGS Aortic arch: Calcified and noncalcified plaque along the arch and patent great vessel origins. Right carotid system: Patent. Mild calcified plaque at the ICA origin causing minimal stenosis. Left carotid system: Patent. Trace calcified plaque at the ICA origin without measurable stenosis. Vertebral arteries: Patent. Left vertebral artery is slightly dominant. Minor calcified plaque is present. Skeleton: Multilevel degenerative changes of the cervical spine. Other neck: No mass or adenopathy. Upper chest: No apical lung mass. Review of the MIP images confirms the above findings CTA HEAD FINDINGS Anterior circulation: Intracranial internal carotid arteries are patent with trace calcified plaque. There is nearly occlusive thrombus within the proximal left M1 MCA with faint contrast enhancement more distally extending into a M2 branch. Diminished flow within distal left MCA branches. Right middle cerebral and both anterior cerebral arteries are patent. Posterior circulation: Intracranial vertebral, basilar, and posterior cerebral arteries  are patent. There are bilateral posterior communicating arteries present. Venous sinuses: As permitted by contrast timing, patent. Review of the MIP images confirms the above findings CT Brain Perfusion Findings: CBF (<30%) Volume: 4106mPerfusion (Tmax>6.0s) volume: 8785mismatch Volume: 24m61mfarction Location: Left basal ganglia corresponding to noncontrast CT findings. Area of penumbra is within the left MCA territory. IMPRESSION: No acute intracranial hemorrhage. 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 ASPECT score of 8. There is nearly occlusive thrombus within the proximal left M1 MCA extending into an M2 branch. Perfusion imaging demonstrates core infarction of 4 mL (corresponds to noncontrast CT finding) and penumbra of 83 mL in the left MCA territory. Initial results were called by telephone at the time of interpretation on 09/02/2019 at 11:43 am to provider Dr. BhagCurly Shoreso verbally acknowledged these results. CTA/CTP results were communicated at 11:55 amon 8/14/2021by text page via the AMIO[facility]Csaging system. Electronically Signed   By: PranMacy Mis.   On: 09/02/2019 12:05    Labs:  CBC: Recent Labs    09/02/19 1130 09/02/19 1134 09/03/19 0319  WBC 8.4  --  11.8*  HGB 14.9 15.6* 13.3  HCT 47.0* 46.0 40.4  PLT 232  --  225    COAGS: Recent Labs    09/02/19 1130  INR 1.0  APTT 34    BMP: Recent Labs    09/02/19 1130 09/02/19 1134 09/03/19 0319  NA 134* 136 138  K 4.6 4.3 4.2  CL 104 104 106  CO2 18*  --  21*  GLUCOSE 136* 138* 136*  BUN 22 27* 16  CALCIUM 9.5  --  9.2  CREATININE 0.85 0.80 0.85  GFRNONAA >  60  --  >60  GFRAA >60  --  >60    LIVER FUNCTION TESTS: Recent Labs    09/02/19 1130  BILITOT 1.0  AST 25  ALT 15  ALKPHOS 63  PROT 7.2  ALBUMIN 4.2    Assessment and Plan: CVA s/p cerebral arteriogram with emergent mechanical thrombectomy of left MCA M1 occlusion via right femoral approach 09/02/2019 by Dr. Karenann Cai. Extubated this AM.  Right facial droop, no movement with RUE and RLE. Intermittently follows commands.  Right femoral puncture site stable, distal pulses identified by doppler.  Plan to follow-up with Dr. Karenann Cai in clinic 1 month after discharge ([facility] schedulers to call patient to set up this appointment). Further plans per neurology/CCM- appreciate and agree with management.   Electronically Signed: Docia Barrier, PA 09/04/2019, 2:27 PM   I spent a total of 15 Minutes at the the patient's bedside AND on the patient's hospital floor or unit, greater than 50% of which was counseling/coordinating care for L MCA occlusion.

## 2019-09-04 NOTE — Progress Notes (Signed)
Pt was extubated this morning and had moderate secretions following. NST performed and Robinul ordered. Pt began tolerating secretions better throughout the day. Cortrack placed due to poor tongue control, facial droop, and pt still experiencing expressive aphagia. Pt worked with PT/OT and SLP. Pt has been experiencing urinary retention since admission, I&Ox3 as of 1400. Per protocol, foley shall be placed if retention persists. Will continue to monitor.

## 2019-09-04 NOTE — Procedures (Signed)
Extubation Procedure Note  Patient Details:   Name: [name] DOB: [birth year] MRN: [id number]   Airway Documentation:    Vent end date: 09/04/19 Vent end time: 0925   Evaluation  O2 sats: stable throughout Complications: No apparent complications Patient did tolerate procedure well. Bilateral Breath Sounds: Clear, Diminished   Yes   Patient extubated per MD order. Positive cuff leak. No stridor. Vitals stable on 2L [place]. RN at bedside.  [name] 09/04/2019, 9:26 AM

## 2019-09-04 NOTE — Evaluation (Signed)
Physical Therapy Evaluation Patient Details Name: [name] MRN: [id number] DOB: November 11, 1932 Today's Date: 09/04/2019   History of Present Illness  86 year old female with past medical history of A. fib not on anticoagulation due to frequent falling and prior subdural hematoma, hypertension, memory loss, GERD who was admitted to the hospital for altered mental status, found to have a left MCA stroke and is now status post thrombectomy. Extubated 8/16.   Clinical Impression  Patient presents with decreased mobility due to R hemiparesis, decreased balance with R inattention and decreased activity tolerance today sat EOB about 15 minutes with min to mod A and attempted sit to stand with max A +2.  She will benefit from skilled PT in the acute setting and follow up inpatient rehab stay prior to d/c home with assist.      Follow Up Recommendations CIR    Equipment Recommendations  Other (comment) (TBA)    Recommendations for Other Services Rehab consult     Precautions / Restrictions Precautions Precautions: Fall Precaution Comments: corerack; R inattention      Mobility  Bed Mobility Overal bed mobility: Needs Assistance Bed Mobility: Rolling;Sidelying to Sit;Sit to Sidelying Rolling: Mod assist;+2 for safety/equipment Sidelying to sit: HOB elevated;Mod assist;+2 for safety/equipment;+2 for physical assistance     Sit to sidelying: +2 for physical assistance;Max assist General bed mobility comments: assist to roll to L due to R UE/LE weakness, assist for side to sit pt helping with L UE pushing on bed; to sidelying, pt able to manage trunk leaning on L elbow, but assist for legs into bed and scooting up with +2  Transfers Overall transfer level: Needs assistance Equipment used: 2 person hand held assist Transfers: Sit to/from Stand Sit to Stand: Max assist;+2 physical assistance         General transfer comment: assist for anterior weight shift and cues and assist for L  lateral lean due to R LE weakness and leaning R at times, only up for about 10 seconds with R LE forward and knee in hyperextension  Ambulation/Gait             General Gait Details: pt unable  Stairs            Wheelchair Mobility    Modified Rankin (Stroke Patients Only) Modified Rankin (Stroke Patients Only) Pre-Morbid Rankin Score: Moderate disability Modified Rankin: Severe disability     Balance Overall balance assessment: Needs assistance Sitting-balance support: Feet supported;Single extremity supported Sitting balance-Leahy Scale: Poor Sitting balance - Comments: leans to R but able to correct up from falling, but still leaning R and needing min A to minguard throughout for safety with balance, pt needing frequent cues to correct due to inattention, up at EOB about 15 minutes; able to assist with oral hygiene with L UE                                     Pertinent Vitals/Pain Pain Assessment: Faces Faces Pain Scale: No hurt    Home Living Family/patient expects to be discharged to:: Private residence Living Arrangements: Alone Available Help at Discharge: Available PRN/intermittently Type of Home: Independent living facility ([facility]) Home Access: Level entry     Home Layout: One level Home Equipment: Shower seat;Grab bars - toilet;Grab bars - tub/shower;Cane - single point;Walker - 2 wheels      Prior Function Level of Independence: Independent with assistive device(s);Needs assistance  ADL's / Homemaking Assistance Needed: has meals delivered, but can do breakfast and coffee, also has cleaning help every other week  Comments: independent living cottage; cane; had assistance with cleaning; drives; has had "alot of falls" per daughter - none in past 6 months; does have life alert but she doesn't use it"very non-compliant" per daughter     Hand Dominance   Dominant Hand: Right    Extremity/Trunk Assessment   Upper  Extremity Assessment Upper Extremity Assessment: Defer to OT evaluation    Lower Extremity Assessment Lower Extremity Assessment: RLE deficits/detail;LLE deficits/detail RLE Deficits / Details: PROM/AAROM WFL, strength 2-/5 hip flexion, 1+/5 knee extension, no active DF noted RLE Sensation: decreased light touch LLE Deficits / Details: AROM WFL, strength hip flexion 4-/5, knee extension 4/5, ankle DF 4/5    Cervical / Trunk Assessment Cervical / Trunk Assessment: Kyphotic;Other exceptions Cervical / Trunk Exceptions: R trunk retraction  Communication   Communication: HOH  Cognition Arousal/Alertness: Awake/alert Behavior During Therapy: WFL for tasks assessed/performed Overall Cognitive Status: Difficult to assess Area of Impairment: Orientation;Memory;Following commands;Attention;Safety/judgement;Problem solving                 Orientation Level: Disoriented to;Place;Situation Current Attention Level: Sustained Memory: Decreased short-term memory Following Commands: Follows one step commands consistently;Follows one step commands with increased time Safety/Judgement: Decreased awareness of deficits   Problem Solving: Slow processing;Decreased initiation;Requires verbal cues;Requires tactile cues General Comments: daughter in the room and reports pt more alert and interactive in therapy session than previously yesterday or today since extubated      General Comments General comments (skin integrity, edema, etc.): daughter present and seems to feel she can get etra hep in to help her mom potentially at ILF vs going into ALF at [place]; brief education with daughter about helping with R inattention sitting on that side and getting her to vocalize when she can    Exercises     Assessment/Plan    PT Assessment Patient needs continued PT services  PT Problem List Decreased strength;Decreased mobility;Decreased safety awareness;Decreased balance;Decreased knowledge of use  of DME;Impaired sensation;Decreased cognition       PT Treatment Interventions DME instruction;Therapeutic activities;Cognitive remediation;Gait training;Therapeutic exercise;Patient/family education;Balance training;Functional mobility training;Wheelchair mobility training    PT Goals (Current goals can be found in the Care Plan section)  Acute Rehab PT Goals Patient Stated Goal: daughter interested in CIR PT Goal Formulation: With patient/family Time For Goal Achievement: 09/18/19 Potential to Achieve Goals: Fair    Frequency Min 4X/week   Barriers to discharge        Co-evaluation PT/OT/SLP Co-Evaluation/Treatment: Yes Reason for Co-Treatment: Complexity of the patient's impairments (multi-system involvement);To address functional/ADL transfers PT goals addressed during session: Mobility/safety with mobility;Balance OT goals addressed during session: ADL's and self-care;Strengthening/ROM       AM-PAC PT "6 Clicks" Mobility  Outcome Measure Help needed turning from your back to your side while in a flat bed without using bedrails?: A Lot Help needed moving from lying on your back to sitting on the side of a flat bed without using bedrails?: Total Help needed moving to and from a bed to a chair (including a wheelchair)?: Total Help needed standing up from a chair using your arms (e.g., wheelchair or bedside chair)?: Total Help needed to walk in hospital room?: Total Help needed climbing 3-5 steps with a railing? : Total 6 Click Score: 7    End of Session Equipment Utilized During Treatment: Gait belt;Oxygen Activity Tolerance: Patient tolerated treatment well  Patient left: in bed;with call bell/phone within reach;with bed alarm set;with family/visitor present (bed in chair position) Nurse Communication: Mobility status PT Visit Diagnosis: Hemiplegia and hemiparesis;Other abnormalities of gait and mobility (R26.89);Other symptoms and signs involving the nervous system  (R29.898) Hemiplegia - Right/Left: Right Hemiplegia - dominant/non-dominant: Dominant Hemiplegia - caused by: Cerebral infarction    Time: 1140-1215 PT Time Calculation (min) (ACUTE ONLY): 35 min   Charges:   PT Evaluation $PT Eval Moderate Complexity: 1 Mod          Cyndi Kaitelyn Jamison, PT Acute Rehabilitation Services Pager:[phone] Office:[phone] 09/04/2019   Reginia Naas 09/04/2019, 12:35 PM

## 2019-09-05 ENCOUNTER — Inpatient Hospital Stay (HOSPITAL_COMMUNITY): Payer: Medicare Other

## 2019-09-05 DIAGNOSIS — F0390 Unspecified dementia without behavioral disturbance: Secondary | ICD-10-CM | POA: Diagnosis not present

## 2019-09-05 DIAGNOSIS — I482 Chronic atrial fibrillation, unspecified: Secondary | ICD-10-CM

## 2019-09-05 DIAGNOSIS — I639 Cerebral infarction, unspecified: Secondary | ICD-10-CM | POA: Diagnosis not present

## 2019-09-05 DIAGNOSIS — E785 Hyperlipidemia, unspecified: Secondary | ICD-10-CM | POA: Diagnosis not present

## 2019-09-05 LAB — GLUCOSE, CAPILLARY
Glucose-Capillary: 136 mg/dL — ABNORMAL HIGH (ref 70–99)
Glucose-Capillary: 151 mg/dL — ABNORMAL HIGH (ref 70–99)
Glucose-Capillary: 151 mg/dL — ABNORMAL HIGH (ref 70–99)
Glucose-Capillary: 128 mg/dL — ABNORMAL HIGH (ref 70–99)

## 2019-09-05 LAB — BASIC METABOLIC PANEL
Anion gap: 10 (ref 5–15)
BUN: 15 mg/dL (ref 8–23)
CO2: 26 mmol/L (ref 22–32)
Calcium: 9.3 mg/dL (ref 8.9–10.3)
Chloride: 96 mmol/L — ABNORMAL LOW (ref 98–111)
Creatinine, Ser: 0.63 mg/dL (ref 0.44–1.00)
GFR calc Af Amer: >60 mL/min (ref >60)
GFR calc non Af Amer: >60 mL/min (ref >60)
Glucose, Bld: 119 mg/dL — ABNORMAL HIGH (ref 70–99)
Potassium: 4 mmol/L (ref 3.5–5.1)
Sodium: 132 mmol/L — ABNORMAL LOW (ref 135–145)

## 2019-09-05 LAB — CBC
HCT: 37.4 % (ref 36.0–46.0)
Hemoglobin: 12.6 g/dL (ref 12.0–15.0)
MCH: 32.1 pg (ref 26.0–34.0)
MCHC: 33.7 g/dL (ref 30.0–36.0)
MCV: 95.4 fL (ref 80.0–100.0)
Platelets: 205 10*3/uL (ref 150–400)
RBC: 3.92 MIL/uL (ref 3.87–5.11)
RDW: 12 % (ref 11.5–15.5)
WBC: 9.1 10*3/uL (ref 4.0–10.5)
nRBC: 0 % (ref 0.0–0.2)

## 2019-09-05 MED ORDER — OSMOLITE 1.2 CAL PO LIQD
1000.0000 mL | ORAL | Status: DC
  Administered 2019-09-05 – 2019-09-06 (×3): 1000 mL
  Filled 2019-09-05 (×6): qty 1000

## 2019-09-05 MED ORDER — PROSOURCE TF PO LIQD
45.0000 mL | Freq: Two times a day (BID) | ORAL | Status: DC
  Administered 2019-09-05 – 2019-09-07 (×4): 45 mL
  Filled 2019-09-05 (×5): qty 45

## 2019-09-05 NOTE — PMR Pre-admission (Signed)
PMR Admission Coordinator Pre-Admission Assessment  Patient: [name] is an 86 y.o., female MRN: [id number] DOB: March 06, 1932 Height: '5\' 5"'$  (165.1 cm) Weight: 82.2 kg        Insurance Information HMO:     PPO:      PCP:      IPA:      80/20:      OTHER:  PRIMARY: Medicare a and b      Policy#:[id number]       Subscriber: pt Benefits:  Phone #: passport one online     Name: Patient  Eff. Date: a 01/20/2000 and b 01/19/2009     Deduct: $1484      Out of Pocket Max: none      Life Max: none CIR: 100%      SNF: 20 full days; 80 copay days Outpatient: 80%     Co-Pay: 20% Home Health: 100%      Co-Pay: none DME: 80%     Co-Pay: 20% Providers: pt choice  SECONDARY: BWalgreen##:[id number]       The Data Collection Information Summary for patients in Inpatient Rehabilitation Facilities with attached Privacy Act [facility] Records was provided and verbally reviewed with: Patient and Family  Emergency Contact Information Contact Information    Name Relation Home Work [place] S[place]aughter   [phone]  MPamella PertDaughter   [phone]    Current Medical History  Patient Admitting Diagnosis: Left putamen nonhemorrhagic infarct   History of Present Illness: An 86year old right-handed female with history of atrial fibrillation not on anticoagulation secondary to prior subdural hematoma, hypertension, hyperlipidemia, CAD, mild cognitive impairment of vascular etiology complicated by alcohol use maintained on Aricept.  History taken from chart review.  [facility] [place] [place]  Presented 09/02/2019 with right side weakness and aphasia.  EMS noted blood pressure of 180s/90s.  Admission chemistry sodium 134, glucose 136, hemoglobin A1c 5.8.  Cranial CT scan showed no acute intracranial hemorrhage.  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  Near occlusive thrombus  within the proximal left M1 MCA extending into M2 branch.  Patient did not receive TPA.  Echocardiogram with ejection fraction of 60 to 65% no wall motion abnormalities.  Patient did undergo complete recanalization per interventional radiology and was intubated for a short time.    Follow-up head CT 09/06/2019 after revascularization procedure showed petechial hemorrhage at the basal ganglia and small subarachnoid hemorrhage along the sylvian fissure and again scan completed 09/06/2019 showing no evidence of new hemorrhage.  Hemorrhagic infarct left putamen stable.  Small amount hemorrhage sylvian fissure and left superior temporal lobe anteriorly stable.  Currently maintained on aspirin for CVA prophylaxis.  Currently n.p.o. with alternative means of nutritional support.  EEG suggestive of cortical dysfunction left hemisphere likely secondary to underlying infarct as well as mild to moderate encephalopathy no seizure activity.  Hospital course further complicated by bouts of urinary retention post void residual 900 mL.  Therapy evaluation completed and patient was admitted for a comprehensive rehab program.  Complete NIHSS TOTAL: 17 Glasgow Coma Scale Score: 14  Past Medical History  Past Medical History:  Diagnosis Date   Arthritis    Atrial fibrillation ([place]    Cataract    BILATERAL   DDD (degenerative disc disease), cervical    Depression    Diverticulosis    Falls    GERD (gastroesophageal reflux  disease)    Glaucoma    Headache    Hyperlipidemia    Hyperlipidemia    Hypertension    Osteoporosis    Peripheral neuropathy    Spondylosis    lumbar   Subdural hematoma (HCC)     Family History  family history includes Breast cancer in her sister; Heart disease in her father; Ovarian cancer in her mother.  Prior Rehab/Hospitalizations:  Has the patient had prior rehab or hospitalizations prior to admission? No  Has the patient had major surgery during 100 days prior  to admission? Yes  Current Medications   Current Facility-Administered Medications:     stroke: mapping our early stages of recovery book, , Does not apply, Once, Bhagat, Srishti L, MD   0.9 %  sodium chloride infusion, , Intravenous, Continuous, Bhagat, Srishti L, MD, Last Rate: 50 mL/hr at 09/07/19 0529, New Bag at 09/07/19 0529   acetaminophen (TYLENOL) tablet 650 mg, 650 mg, Oral, Q4H PRN **OR** acetaminophen (TYLENOL) 160 MG/5ML solution 650 mg, 650 mg, Per Tube, Q4H PRN, 650 mg at 09/03/19 1526 **OR** acetaminophen (TYLENOL) suppository 650 mg, 650 mg, Rectal, Q4H PRN, Bhagat, Srishti L, MD   amantadine (SYMMETREL) capsule 100 mg, 100 mg, Oral, Daily **OR** amantadine (SYMMETREL) 50 MG/5ML solution 100 mg, 100 mg, Per Tube, Daily, Donnetta Simpers, MD, 100 mg at 09/07/19 1158   amLODipine (NORVASC) tablet 10 mg, 10 mg, Per Tube, Daily, Crim, Courtney, MD, 10 mg at 09/07/19 1158   aspirin suppository 300 mg, 300 mg, Rectal, Daily **OR** aspirin chewable tablet 81 mg, 81 mg, Per Tube, Daily, Biby, Sharon L, NP, 81 mg at 09/07/19 1158   atorvastatin (LIPITOR) tablet 40 mg, 40 mg, Per Tube, Daily, Rinehuls, David L, PA-C, 40 mg at 09/07/19 1158   chlorhexidine gluconate (MEDLINE KIT) (PERIDEX) 0.12 % solution 15 mL, 15 mL, Mouth Rinse, BID, de Sindy Messing, [place], MD, 15 mL at 09/07/19 1100   Chlorhexidine Gluconate Cloth 2 % PADS 6 each, 6 each, Topical, Daily, de Sindy Messing, [place], MD, 6 each at 09/06/19 1020   donepezil (ARICEPT) tablet 10 mg, 10 mg, Per Tube, QHS, Donnamae Jude, RPH, 10 mg at 09/06/19 2152   feeding supplement (OSMOLITE 1.2 CAL) liquid 1,000 mL, 1,000 mL, Per Tube, Continuous, Garvin Fila, MD, Last Rate: 55 mL/hr at 09/06/19 1900, 1,000 mL at 09/06/19 1900   feeding supplement (PROSource TF) liquid 45 mL, 45 mL, Per Tube, BID, Garvin Fila, MD, 45 mL at 09/07/19 1159   fentaNYL (SUBLIMAZE) injection 25-100 mcg, 25-100 mcg, Intravenous, Q2H  PRN, Anders Simmonds, MD, 25 mcg at 09/04/19 0035   glycopyrrolate (ROBINUL) injection 0.1 mg, 0.1 mg, Intravenous, Q4H PRN, Desai, Rahul P, PA-C, 0.1 mg at 09/07/19 1117   latanoprost (XALATAN) 0.005 % ophthalmic solution 1 drop, 1 drop, Both Eyes, QHS, Biby, Sharon L, NP, 1 drop at 09/06/19 2154   losartan (COZAAR) tablet 25 mg, 25 mg, Per Tube, Daily, Crim, Courtney, MD, 25 mg at 09/07/19 1158   MEDLINE mouth rinse, 15 mL, Mouth Rinse, 10 times per day, de Sindy Messing, Erven Colla, MD, 15 mL at 09/07/19 1100   metoprolol tartrate (LOPRESSOR) 25 mg/10 mL oral suspension 50 mg, 50 mg, Per Tube, BID, Crim, Courtney, MD, 50 mg at 09/07/19 1158   multivitamin liquid 15 mL, 15 mL, Per Tube, Daily, Donnamae Jude, RPH, 15 mL at 09/07/19 1158   pantoprazole sodium (PROTONIX) 40 mg/20 mL oral suspension 40 mg, 40 mg, Per  Tube, QHSDonnamae Jude, RPH, 40 mg at 09/06/19 2154   senna-docusate (Senokot-S) tablet 1 tablet, 1 tablet, Oral, QHS PRN, Bhagat, Srishti L, MD   sertraline (ZOLOFT) tablet 50 mg, 50 mg, Per Tube, Daily, Donnamae Jude, RPH, 50 mg at 09/07/19 1158   timolol (TIMOPTIC) 0.5 % ophthalmic solution 1 drop, 1 drop, Both Eyes, Daily, Bhagat, Srishti L, MD, 1 drop at 09/07/19 1216   triamcinolone cream (KENALOG) 0.1 % 1 application, 1 application, Topical, Daily PRN, Donzetta Starch, NP   vitamin B-12 (CYANOCOBALAMIN) tablet 3,000 mcg, 3,000 mcg, Per Tube, Daily, Donnamae Jude, RPH, 3,000 mcg at 09/07/19 1158  Patients Current Diet:  Diet Order    None      Precautions / Restrictions Precautions Precautions: Fall Precaution Comments: corerack; R inattention Restrictions Weight Bearing Restrictions: No   Has the patient had 2 or more falls or a fall with injury in the past year?Yes  Prior Activity Level  Pt. Was living in an independent living facility, but was driving and performing all ADLs with the exception of medication management prior to admission.   Prior  Functional Level Prior Function Level of Independence: Independent with assistive device(s), Needs assistance ADL's / Homemaking Assistance Needed: has meals delivered, but can do breakfast and coffee, also has cleaning help every other week Comments: independent living cottage; cane; had assistance with cleaning; drives; has had "alot of falls" per daughter - none in past 6 months; does have life alert but she doesn't use it"very non-compliant" per daughter  Self Care: Did the patient need help bathing, dressing, using the toilet or eating?  Independent  Indoor Mobility: Did the patient need assistance with walking from room to room (with or without device)? Independent  Stairs: Did the patient need assistance with internal or external stairs (with or without device)? Independent  Functional Cognition: Did the patient need help planning regular tasks such as shopping or remembering to take medications? Independent  Development worker, international aid / Equipment Home Equipment: Shower seat, Grab bars - toilet, Grab bars - tub/shower, Cane - single point, Walker - 2 wheels  Prior Device Use: Indicate devices/aids used by the patient prior to current illness, exacerbation or injury? Walker  Current Functional Level Cognition  Arousal/Alertness: Awake/alert Overall Cognitive Status: Impaired/Different from baseline Difficult to assess due to: Impaired communication Current Attention Level: Sustained Orientation Level: Oriented to person Following Commands: Follows one step commands with increased time Safety/Judgement: Decreased awareness of deficits General Comments: Pt often saying "yes" without consistent accuracy.  multimodal cues for understandinhg, when testing sensation, pt said "yes" throughout even when not touching, R inattention.  Attention: Sustained Sustained Attention: Appears intact (simple, functional tasks) Awareness: Impaired Awareness Impairment: Emergent impairment     Extremity Assessment (includes Sensation/Coordination)  Upper Extremity Assessment: RUE deficits/detail RUE Deficits / Details: no spontaneous movement noted; abnormal tone; no shoulder shrug observed; not using as functional assist RUE Sensation: decreased light touch, decreased proprioception RUE Coordination: decreased fine motor, decreased gross motor  Lower Extremity Assessment: Defer to PT evaluation RLE Deficits / Details: PROM/AAROM WFL, strength 2-/5 hip flexion, 1+/5 knee extension, no active DF noted RLE Sensation: decreased light touch LLE Deficits / Details: AROM WFL, strength hip flexion 4-/5, knee extension 4/5, ankle DF 4/5    ADLs  Overall ADL's : Needs assistance/impaired Eating/Feeding: NPO Grooming: Moderate assistance Grooming Details (indicate cue type and reason): able to "brush teeth" with [place] after set up using L hand Upper Body Bathing:  Moderate assistance, Sitting Lower Body Bathing: Maximal assistance, Sit to/from stand, Sitting/lateral leans Upper Body Dressing : Maximal assistance, Sitting Lower Body Dressing: Total assistance Toileting- Clothing Manipulation and Hygiene: Total assistance Functional mobility during ADLs: Maximal assistance, +2 for physical assistance    Mobility  Overal bed mobility: Needs Assistance Bed Mobility: Rolling, Sidelying to Sit, Sit to Supine Rolling: Mod assist Sidelying to sit: Mod assist, HOB elevated Sit to supine: Max assist Sit to sidelying: +2 for physical assistance, Max assist General bed mobility comments: Pt was able, with multimodal cues to roll to her right using R bed rail, assist in progressing her left leg over to the side of the bed and scoot, Mod assist overall to help progress trunk up to sitting and scoot the rest of the way to EOB.  Max assist to return to supine due to assist at both trunk and legs.      Transfers  Overall transfer level: Needs assistance Equipment used: 2 person hand held  assist Transfers: Sit to/from Stand Sit to Stand: +2 physical assistance, Max assist General transfer comment: would try the stedy next session with second person assist.     Ambulation / Gait / Stairs / Wheelchair Mobility  Ambulation/Gait General Gait Details: pt unable    Posture / Balance Dynamic Sitting Balance Sitting balance - Comments: up to mod assist EOB, but as good as min guard assist for sitting balance.  Practiced leaning to both sides and coming back up x 10, sitting in midline, LE exercises, UE exercises, and ADL task of combing her hair with her left hand and using the [place] suction with her left hand.  Balance Overall balance assessment: Needs assistance Sitting-balance support: Feet supported, Bilateral upper extremity supported Sitting balance-Leahy Scale: Poor Sitting balance - Comments: up to mod assist EOB, but as good as min guard assist for sitting balance.  Practiced leaning to both sides and coming back up x 10, sitting in midline, LE exercises, UE exercises, and ADL task of combing her hair with her left hand and using the [place] suction with her left hand.  Postural control: Right lateral lean Standing balance support: Single extremity supported Standing balance-Leahy Scale: Zero Standing balance comment: +2 A for standing    Special needs/care consideration Skin: intact Special service needs: Pt. Currently with [place] in place Designated visitor Luberta Robertson (daughter)     Previous Home Environment (from acute therapy documentation) Living Arrangements: Alone Available Help at Discharge: Available PRN/intermittently Type of Home: Independent living facility Home Layout: One level Home Access: Level entry Bathroom Shower/Tub: Multimedia programmer: Handicapped height Bathroom Accessibility: Yes How Accessible: Accessible via wheelchair  Discharge Living Setting Plans for Discharge Living Setting: Apartment Type of Home at Discharge:  [place] Name at Discharge: [place] Discharge Home Layout: One level Discharge Home Access: Level entry Discharge Bathroom Shower/Tub: Walk-in shower Discharge Bathroom Toilet: Handicapped height Discharge Bathroom Accessibility: Yes How Accessible: Accessible via walker Does the patient have any problems obtaining your medications?: No  Social/Family/Support Systems Patient Roles: Other (Comment) Contact Information: [phone] Anticipated Caregiver: Wilmer Floor (daughter) Anticipated Caregiver's Contact Information: [phone] Caregiver Availability: 24/7 Discharge Plan Discussed with Primary Caregiver: Yes Is Caregiver In Agreement with Plan?: Yes  Goals Patient/Family Goal for Rehab: PT/OT  S/min A, SLP Mod I/ S Expected Length of Stay: 20-24 days Pt. And family Agrees to admission: Yes Program orientation Provided and reviewed with pt.: yes   Decrease burden of Care through  IP rehab admission: Specialzed equipment needs, Diet advancement, Decrease number of caregivers, Bowel and bladder program and Patient/family education  Possible need for SNF placement upon discharge: Not anticipated   Patient Condition: This patient's condition remains as documented in the consult dated 09/06/19, in which the Rehabilitation Physician determined and documented that the patient's condition is appropriate for intensive rehabilitative care in an inpatient rehabilitation facility. Will admit to inpatient rehab today.  Preadmission Screen Completed By:  Retta Diones, RN, 09/07/2019 12:43 PM ______________________________________________________________________   Discussed status with Dr. Naaman Plummer and Dr. Alveta Heimlich on 09/07/19 at 1000 am and received approval for admission today.  Admission Coordinator:  Retta Diones, time 1243/Date 09/07/19

## 2019-09-05 NOTE — Progress Notes (Signed)
STROKE TEAM PROGRESS NOTE   INTERVAL HISTORY Patient daughter is at the bedside.  She is sitting up in bed.  She is having oral secretions and is coughing.  She does speak barely a few words.  She remains with dense right hemiplegia and left gaze preference and aphasia.  Vital signs are stable.  Neuro exam is unchanged.  She had a pended tube inserted yesterday and is being fed.  OBJECTIVE Vitals:   09/05/19 0500 09/05/19 0600 09/05/19 0700 09/05/19 0800  BP: (!) 160/87 (!) 163/106 (!) 154/72   Pulse: 71 64 64   Resp: 20 15 19    Temp:    99.1 F (37.3 C)  TempSrc:    Axillary  SpO2: 97% 98% 96%   Weight:      Height:       CBC:  Recent Labs  Lab 09/02/19 1130 09/02/19 1134 09/04/19 1633 09/05/19 0555  WBC 8.4   < > 10.0 9.1  NEUTROABS 6.7  --   --   --   HGB 14.9   < > 13.1 12.6  HCT 47.0*   < > 39.4 37.4  MCV 99.8   < > 97.3 95.4  PLT 232   < > 202 205   < > = values in this interval not displayed.   Basic Metabolic Panel:  Recent Labs  Lab 09/04/19 1633 09/05/19 0555  NA 134* 132*  K 3.9 4.0  CL 97* 96*  CO2 24 26  GLUCOSE 118* 119*  BUN 11 15  CREATININE 0.71 0.63  CALCIUM 9.4 9.3   Lipid Panel:     Component Value Date/Time   CHOL 206 (H) 09/03/2019 0319   TRIG 230 (H) 09/03/2019 0319   HDL 41 09/03/2019 0319   CHOLHDL 5.0 09/03/2019 0319   VLDL 46 (H) 09/03/2019 0319   LDLCALC 119 (H) 09/03/2019 0319   HgbA1c:  Lab Results  Component Value Date   HGBA1C 5.8 (H) 09/03/2019   IMAGING  CT HEAD CODE STROKE WO CONTRAST CT ANGIO HEAD CODE STROKE CT ANGIO NECK CODE STROKE CT CEREBRAL PERFUSION W CONTRAST 09/02/2019 No acute intracranial hemorrhage. 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 ASPECT score of 8. There is nearly occlusive thrombus within the proximal left M1 MCA extending into an M2 branch. Perfusion imaging demonstrates core infarction of 4 mL (corresponds to noncontrast CT finding) and penumbra of 83 mL in the  left MCA territory.   DIAGNOSTIC CEREBRAL ANGIOGRAM AND MECHANICAL THROMBECTOMY Dr. Erven Colla de Sindy Messing Proximal left M1/MCA occlusion. 3 passes performed with aspiration + stent retriever (embotrap 34mm) with complete recanalization. No embolus to new or distal territory. Small right sylvian fissure SAH on post procedural flat panel CT. PLAN:  - Transfer to ICU - Maintain intubated as no covid-19 test result available - SBP 120-140 mmHg - Bed rest post femoral access x6 hours  MRI Brain 09/03/2019 Acute infarcts of the left MCA territory involving the basal ganglia and adjacent white matter and parietotemporal lobes. Petechial reperfusion hemorrhage primarily involving the basal ganglia and small volume subarachnoid hemorrhage as seen on CT. Advanced chronic microvascular ischemic changes.   CT HEAD WO CONTRAST 09/03/2019 New hyperdensity of the previously hypodense left basal ganglia may reflect petechial reperfusion hemorrhage or contrast staining. Hyperdensity along the left MCA and sylvian fissure likely reflects small volume subarachnoid hemorrhage.   Transthoracic Echocardiogram  09/03/2019 1. Left ventricular ejection fraction, by estimation, is 60 to 65%. The left ventricle has normal function.  The left ventricle has no regional wall motion abnormalities. There is mild concentric left ventricular hypertrophy. Left ventricular diastolic function could not be evaluated.   2. Right ventricular systolic function is normal. The right ventricular size is moderately enlarged.   3. The mitral valve is degenerative. Trivial mitral valve regurgitation. No evidence of mitral stenosis.  4. The aortic valve is tricuspid. Aortic valve regurgitation is not visualized. Mild aortic valve stenosis.  Conclusion(s)/Recommendation(s): Patient with stroke and atrial fibrillation. No intracardiac source of embolism identified other than atrial fibrilliation rhythm.   ECG - atrial fibrillation -  ventricular response 92 BPM (See cardiology reading for complete details)   PHYSICAL EXAM    Pleasant elderly Caucasian lady  Not in distress. . Afebrile. Head is nontraumatic. Neck is supple without bruit.    Cardiac exam no murmur or gallop. Lungs are clear to auscultation. Distal pulses are well felt.  She is wearing a neck collar Neurological Exam : Patient is awake and alert.  She appears to be aphasic and does not follow commands consistently.  She speaks only occasional words.  Has left gaze preference and she will not look to the right past midline.  She blinks to threat on the left but not on the right.  Right lower facial weakness.  Tongue midline.  She moves left upper and lower extremities purposefully against gravity.  Right upper extremity is flaccid and only trace withdrawal to pain.  She does withdraw right lower extremity pain but not voluntarily.  Deep tendon reflexes are depressed on the right and present on the left right plantar upgoing left downgoing.  Gait not tested   ASSESSMENT/PLAN [name] is a 86 y.o. female with history of atrial fibrillation (not on anticoagulation secondary to prior subdural hematoma), hypertension, hyperlipidemia, heart disease, mild cognitive impairment of vascular etiology complicated by alcohol use presenting with speech disturbance, left-sided gaze deviation and nonrhythmic shaking of the left side. She did not receive IV t-PA due to late presentation. IR - Proximal left M1/MCA occlusion. 3 passes aspiration + stent retriever with complete recanalization. Small right sylvian fissure SAH post procedure.  Stroke:  Left MCA territory acute infarction s/p IR w/ SAH - embolic - atrial fibrillation  Resultant aphasia and right hemiplegia  Code Stroke CT Head -  Left MCA territory acute infarction involving the basal ganglia. ASPECT score of 8.    CTA H&N - nearly occlusive thrombus within the proximal left M1 MCA extending into an M2  branch  CT Perfusion - core infarction of 4 mL (corresponds to noncontrast CT finding) and penumbra of 83 mL in the left MCA territory  CT head - Small right sylvian fissure SAH on post procedural flat panel CT.  MRI head - L MCA infarcts w/ L basal ganglia petechial hemorrhage or contrast staining, SAH  2D Echo - AF, EF 60-65%, no other SOE  [employer] Virus 2 - negative  LDL - 119  HgbA1c - 5.8  VTE prophylaxis - SCDs    No antithrombotic prior to admission, add aspirin 300 supp vs 81 per tube daily (ok in setting of current [facility])  Therapy recommendations:  SNF (too low level for CIR per admission coordinator)  Disposition:  Pending  Transfer to the floor  Acute Respiratory Failure, iproved   Secondary to stroke  Intubated for IR  Extubated 8/16  Tolerating well so far   CCM on board  Atrial Fibrillation  Home anticoagulation:  none, hx SDH  Will consider AC at d/c  Hypertension  Home BP meds: metoprolol ; Norvasc ; Cozaar  Current BP meds: norvasc 10, cozaar 25, metoprolol 50 bid    Stable . BP goal < 180 (mild hemorrhagic transformation, ok for higher GP than primary hemorrhage) . Long-term BP goal normotensive  Hyperlipidemia  Home Lipid lowering medication: Zocor 20 mg daily  LDL 119, goal < 70  Current lipid lowering medication: changed to Lipitor 40 mg   Continue statin at discharge  Dysphagia . Secondary to stroke . NPO  . Add cortrak and TF today . Speech on board   Other Stroke Risk Factors  Advanced age  Former cigarette smoker - quit  ETOH abuse, advised to drink no more than 1 alcoholic beverage per day.  Obesity, Body mass index is 30.93 kg/m., recommend weight loss, diet and exercise as appropriate   Coronary artery disease  Other Active Problems  Hx of mild cognitive impairment  ETOH - CIWA  GERD   Mild Leukocytosis - WBCs - 8.4->11.8-10->9.1 - resolved (afebrile)  Urinary retention, I&O cath x 3 -> foley  placed. Consider urecholine.   Hospital day # 3 Continue ongoing therapies.  Mobilize out of bed.  Continue tube feeding through [facility] till swallowing function improves.  Add aspirin 81 mg daily tube feeds for stroke prevention and will consider anticoagulation at the time of discharge.  Long discussion with the patient and daughter and answered questions.  Transfer to neurology floor bed when available.  Rehab team has been consulted and awaiting their response.  Greater than 50% time during this 35-minute visit was spent on counseling and coordination of care and answering questions  Antony Contras, MD  To contact Stroke Continuity provider, please refer to [email or web address] After hours, contact General Neurology

## 2019-09-05 NOTE — Progress Notes (Signed)
Pt has been admitted via hospital bed to the unit. All belongings sent with the patient. Pt has been connected to telemetry, all IV are intact and equipment has been sent with the patient. All belongings are sent with the patient and the telephone and call light are within reach.

## 2019-09-05 NOTE — Progress Notes (Signed)
Physical Therapy Treatment Patient Details Name: [name] COLLAR MRN: [id number] DOB: [name] 08, 1934 Today's Date: 09/05/2019    History of Present Illness 86 year old female with past medical history of A. fib not on anticoagulation due to frequent falling and prior subdural hematoma, hypertension, memory loss, GERD who was admitted to the hospital for altered mental status, found to have a left MCA stroke and is now status post thrombectomy. Extubated 8/16.     PT Comments    Patient progressing this session with improved sitting balance until fatigued, then able to stand with increased upright posture with max A of 2 and perform lateral weight shifts.  Unable with +2 A to pivot to recliner toward her strong side due to imbalance and limited weight acceptance on involved side.  PT to continue to follow.  Will benefit from CIR at d/c.    Follow Up Recommendations  CIR     Equipment Recommendations  Other (comment) (TBA)    Recommendations for Other Services       Precautions / Restrictions Precautions Precautions: Fall Precaution Comments: corerack; R inattention    Mobility  Bed Mobility Overal bed mobility: Needs Assistance Bed Mobility: Rolling;Sidelying to Sit;Sit to Supine Rolling: Mod assist Sidelying to sit: Mod assist;+2 for physical assistance   Sit to supine: +2 for physical assistance;Max assist   General bed mobility comments: cues and increased time to roll to L, up to sit pushing some with L UE, to supine assist for legs and trunk  Transfers Overall transfer level: Needs assistance Equipment used: 2 person hand held assist Transfers: Sit to/from Stand Sit to Stand: +2 physical assistance;Max assist         General transfer comment: stood x 1 in attempt to transfer to recliner, but pt unable to step with L LE to pivot due to R LE weakness and imbalance; stood second time with L hand holding onto back of recliner and max A for upright posture, standing weight  shifts to "My bonnie lies over the ocean"  Ambulation/Gait                 Stairs             Wheelchair Mobility    Modified Rankin (Stroke Patients Only) Modified Rankin (Stroke Patients Only) Pre-Morbid Rankin Score: Moderate disability Modified Rankin: Severe disability     Balance Overall balance assessment: Needs assistance Sitting-balance support: Feet supported Sitting balance-Leahy Scale: Poor Sitting balance - Comments: initially needing min A but after work on standing able to sit with S and min to mod cues for correcting LOB, then with fatigue needed mod A for sitting   Standing balance support: Single extremity supported Standing balance-Leahy Scale: Zero Standing balance comment: +2 A for standing                            Cognition Arousal/Alertness: Awake/alert Behavior During Therapy: Flat affect Overall Cognitive Status: Impaired/Different from baseline                     Current Attention Level: Sustained   Following Commands: Follows one step commands consistently;Follows one step commands with increased time Safety/Judgement: Decreased awareness of deficits   Problem Solving: Slow processing;Decreased initiation        Exercises Other Exercises Other Exercises: R LE hip and knee flexion and facilitation for initating extension in supine without noted activation    General Comments  Pertinent Vitals/Pain Pain Assessment: Faces Faces Pain Scale: No hurt    Home Living                      Prior Function            PT Goals (current goals can now be found in the care plan section) Progress towards PT goals: Progressing toward goals    Frequency    Min 4X/week      PT Plan Current plan remains appropriate    Co-evaluation              AM-PAC PT "6 Clicks" Mobility   Outcome Measure  Help needed turning from your back to your side while in a flat bed without using  bedrails?: A Lot Help needed moving from lying on your back to sitting on the side of a flat bed without using bedrails?: A Lot Help needed moving to and from a bed to a chair (including a wheelchair)?: Total Help needed standing up from a chair using your arms (e.g., wheelchair or bedside chair)?: Total Help needed to walk in hospital room?: Total Help needed climbing 3-5 steps with a railing? : Total 6 Click Score: 8    End of Session Equipment Utilized During Treatment: Gait belt Activity Tolerance: Patient limited by fatigue Patient left: in bed;with call bell/phone within reach;with bed alarm set;with family/visitor present   PT Visit Diagnosis: Other abnormalities of gait and mobility (R26.89);Hemiplegia and hemiparesis;Other symptoms and signs involving the nervous system (R29.898) Hemiplegia - Right/Left: Right Hemiplegia - dominant/non-dominant: Dominant Hemiplegia - caused by: Cerebral infarction     Time: 6222-9798 PT Time Calculation (min) (ACUTE ONLY): 30 min  Charges:  $Therapeutic Activity: 23-37 mins                     Magda Kiel, PT Acute Rehabilitation Services [phone] Office:[phone] 09/05/2019    Reginia Naas 09/05/2019, 6:44 PM

## 2019-09-05 NOTE — Progress Notes (Signed)
[facility] Progress Note Patient Name: [name] DOB: 08/13/1932 MRN: [id number]   Date of Service  09/05/2019  HPI/Events of Note  Urinary retention - Patient has been I/O cathed 3 times in last 24 hours. Now with 900 mL residual on bladder scan.   eICU Interventions  Plan: 1. Place Foley catheter.      Intervention Category Major Interventions: Other:  Lysle Dingwall 09/05/2019, 12:20 AM

## 2019-09-05 NOTE — Progress Notes (Signed)
Initial Nutrition Assessment  DOCUMENTATION CODES:   Not applicable  INTERVENTION:   Continue tube feeding via Cortrak: - Osmolite 1.2 @ 55 ml/hr (1320 ml/day) - ProSource TF 45 ml BID - Free water per MD  Tube feeding regimen provides 1664 kcal, 95 grams of protein, and 1082 ml of H2O.   NUTRITION DIAGNOSIS:   Inadequate oral intake related to dysphagia as evidenced by NPO status.  GOAL:   Patient will meet greater than or equal to 90% of their needs  MONITOR:   Diet advancement, Labs, Weight trends, TF tolerance, Skin, I & O's  REASON FOR ASSESSMENT:   Consult Enteral/tube feeding initiation and management  ASSESSMENT:   86 year old female who who presented on 8/14 after being found unresponsive on the floor. PMH of atrial fibrillation, HTN, HLD, GERD, prior SDH, EtOH abuse. Pt admitted with left MCA stroke.   8/14 - s/p diagnostic cerebral angiogram and mechanical thrombectomy 8/16 - extubated, Cortrak placed (tip gastric per Cortrak team), TF protocol ordered  Spoke with pt's daughter at bedside. She reports pt's appetite had decreased somewhat over the last year. She reports that pt typically ate 2-3 meals daily.  Breakfast (mostly liquids): orange juice, water, diet Coke, "keto" coffee, tomato juice Lunch: hummus or something light Dinner: full meal with [place] popsicles, 2 glasses of wine  Pt is from [place] and typically goes for dinner or "happy hour" with friends.  [name] reports that pt's weight has been stable. [name] has not noticed that pt has gained or loss any weight recently.  [name] does state that that pt has multiple bowel movements all morning and "stomach pains." Pt follows with a GI doctor but they are unsure what is causing this. Pt is on Prilosec for acid reflux.  Discussed plan for TF via Cortrak with pt and pt's daughter. Pt complaining of abdominal pain at time of visit. Will continue with fiber-free TF formula at  this time.  Current TF: Osmolite 1.2 @ 50 ml/hr which provides 1440 kcal and 67 grams of protein daily.  Medications reviewed and include: liquid MVI, protonix, vitamin B-12 3000 mcg daily IVF: NS @ 50 ml/hr  Labs reviewed: sodium 132, cholesterol 206, LDL 119, TG 230 CBG's: 115-151 x 24 hours  UOP: 2425 ml x 24 hours I/O's: -1.3 L since admit  NUTRITION - FOCUSED PHYSICAL EXAM:    Most Recent Value  Orbital Region No depletion  Upper Arm Region No depletion  Thoracic and Lumbar Region No depletion  Buccal Region No depletion  Temple Region No depletion  Clavicle Bone Region Mild depletion  Clavicle and Acromion Bone Region Mild depletion  Scapular Bone Region No depletion  Dorsal Hand No depletion  Patellar Region No depletion  Anterior Thigh Region No depletion  Posterior Calf Region No depletion  Edema (RD Assessment) None  Hair Reviewed  Eyes Reviewed  Mouth Reviewed  Skin Reviewed  Nails Reviewed       Diet Order:   Diet Order    None      EDUCATION NEEDS:   No education needs have been identified at this time  Skin:  Skin Assessment: Skin Integrity Issues: Incisions: right groin  Last BM:  no documented BM  Height:   Ht Readings from Last 1 Encounters:  09/02/19 5\' 5"  (1.651 m)    Weight:   Wt Readings from Last 1 Encounters:  09/02/19 84.3 kg    Ideal Body Weight:  56.8 kg  BMI:  Body mass  index is 30.93 kg/m.  Estimated Nutritional Needs:   Kcal:  1600-1800  Protein:  80-95 grams  Fluid:  1.6-1.8 L    Gaynell Face, MS, RD, LDN Inpatient Clinical Dietitian Please see [facility] for contact information.

## 2019-09-05 NOTE — Progress Notes (Signed)
Patient transferred to [phone] with daughter at bedside with belongings.

## 2019-09-05 NOTE — Consult Note (Signed)
Physical Medicine and Rehabilitation Consult Reason for Consult: Right side weakness, altered mental status with aphasia Referring Physician: Dr. Leonie Man   HPI: [name] is a 86 y.o. right-handed female with history of atrial fibrillation not on anticoagulation secondary to prior subdural hematoma, hypertension, hyperlipidemia, CAD, mild cognitive impairment of vascular etiology complicated by alcohol use.  History taken from chart review due to cognition.  Patient resides in an independent living at [facility] in [place].  She presented on 09/02/2019 with right hemiparesis and aphasia.  EMS noted blood pressure of 180s/90s.  Cranial CT scan showed no acute intracranial hemorrhage.  Left MCA territory acute infarct involving the basal ganglia.  Near occlusive thrombus within the proximal left M1 MCA extending into M2 branch.  Patient did not receive TPA.  Patient did undergo complete recanalization per interventional radiology.  Admission chemistry sodium 134, glucose 136, hemoglobin A1c 5.8.  Echocardiogram with ejection fraction of 60 to 65%.  No wall motion abnormalities.  Follow-up head CT on 09/06/2019 personally reviewed, showing stable hemorrhage.  Currently maintained on aspirin for CVA prophylaxis.  Currently n.p.o. with alternative means of nutritional support.  EEG suggestive of cortical dysfunction left hemisphere likely secondary to underlying infarct as well as mild/moderate encephalopathy no definite epileptiform activity.  Hospital course further complicated by bouts of urinary retentionwith post void residual of 900 mL.  Therapy evaluations completed with recommendations of physical medicine rehab consult.  Review of Systems  Unable to perform ROS: Mental acuity   Past Medical History:  Diagnosis Date  . Arthritis   . Atrial fibrillation ([place])   . Cataract    BILATERAL  . DDD (degenerative disc disease), cervical   . Depression   . Diverticulosis   . Falls   .  GERD (gastroesophageal reflux disease)   . Glaucoma   . Headache   . Hyperlipidemia   . Hyperlipidemia   . Hypertension   . Osteoporosis   . Peripheral neuropathy   . Spondylosis    lumbar  . Subdural hematoma [facility])    Past Surgical History:  Procedure Laterality Date  . APPENDECTOMY    . BACK SURGERY  2010  . BURR HOLE FOR SUBDURAL HEMATOMA     x 2 hematoma  . COLONOSCOPY    . IR CT HEAD LTD  09/04/2019  . IR PERCUTANEOUS ART THROMBECTOMY/INFUSION INTRACRANIAL INC DIAG ANGIO  09/02/2019      . IR PERCUTANEOUS ART THROMBECTOMY/INFUSION INTRACRANIAL INC DIAG ANGIO  09/04/2019  . KNEE SURGERY Bilateral   . RADIOLOGY WITH ANESTHESIA N/A 09/02/2019   Procedure: IR WITH ANESTHESIA;  Surgeon: Radiologist, Medication, MD;  Location: [facility];  Service: Radiology;  Laterality: N/A;  . VAGINAL HYSTERECTOMY     Family History  Problem Relation Age of Onset  . Heart disease Father   . Ovarian cancer Mother        ?  Marland Kitchen Breast cancer Sister   . Colon cancer Neg Hx   . Diabetes Neg Hx   . Liver disease Neg Hx   . Kidney disease Neg Hx   . Dementia Neg Hx    Social History:  reports that she has quit smoking. She has never used smokeless tobacco. She reports current alcohol use of about 14.0 standard drinks of alcohol per week. She reports that she does not use drugs. Allergies:  Allergies  Allergen Reactions  . Codeine Other (See Comments)    unknown   Medications Prior to Admission  Medication Sig Dispense Refill  . acetaminophen (TYLENOL) 650 MG CR tablet Take 650 mg by mouth 2 (two) times daily.     Marland Kitchen amLODipine (NORVASC) 10 MG tablet Take 10 mg by mouth daily.     . APPLE CIDER VINEGAR PO Take 1,200 mg by mouth daily.    . Ascorbic Acid (VITAMIN C) 1000 MG tablet Take 1,000 mg by mouth daily.    . Biotin 5000 MCG TABS Take 5,000 mcg by mouth daily.    . Cholecalciferol (VITAMIN D3 SUPER STRENGTH) 50 MCG (2000 UT) TABS Take 2,000 Units by mouth daily.    . clobetasol (TEMOVATE)  0.05 % external solution Apply 1 application topically daily as needed (scalp itching).    . diphenhydrAMINE HCl, Sleep, (ZZZQUIL PO) Take 3 capsules by mouth at bedtime.    . donepezil (ARICEPT) 10 MG tablet Take 10 mg by mouth at bedtime.    Marland Kitchen latanoprost (XALATAN) 0.005 % ophthalmic solution Place 1 drop into both eyes at bedtime.    Marland Kitchen loratadine (CLARITIN) 10 MG tablet Take 10 mg by mouth daily as needed for itching.     . losartan (COZAAR) 25 MG tablet Take 25 mg by mouth daily.    . Melatonin 10 MG TABS Take 30 mg by mouth at bedtime.     . meloxicam (MOBIC) 15 MG tablet Take 15 mg by mouth daily as needed for pain.     . metoprolol tartrate (LOPRESSOR) 50 MG tablet Take 50 mg by mouth 2 (two) times daily.     Marland Kitchen omeprazole (PRILOSEC) 40 MG capsule Take 40 mg by mouth daily.     Marland Kitchen OVER THE COUNTER MEDICATION Take 2 tablets by mouth daily. "Sugar Bear Hair"    . Psyllium (METAMUCIL PO) Take 1 Dose by mouth daily as needed (constipation).    . Sennosides (EX-LAX PO) Take 1 tablet by mouth daily as needed (constipation).    . sertraline (ZOLOFT) 50 MG tablet Take 50 mg by mouth daily.     . Simethicone (GAS-X PO) Take 1 tablet by mouth daily as needed (gas/bloating/flatulence).    . simvastatin (ZOCOR) 20 MG tablet Take 20 mg by mouth at bedtime.     . timolol (TIMOPTIC) 0.5 % ophthalmic solution Place 1 drop into both eyes daily.    Marland Kitchen triamcinolone lotion (KENALOG) 0.1 % Apply 1 application topically daily as needed (itching).    . TURMERIC PO Take 200 mg by mouth daily.    . vitamin B-12 (CYANOCOBALAMIN) 1000 MCG tablet Take 3,000 mcg by mouth daily.      Home: Home Living Family/patient expects to be discharged to:: Private residence Living Arrangements: Alone Available Help at Discharge: Available PRN/intermittently Type of Home: Independent living facility Home Access: Level entry Home Layout: One level Bathroom Shower/Tub: Health visitor: Handicapped  height Bathroom Accessibility: Yes Home Equipment: Shower seat, Grab bars - toilet, Grab bars - tub/shower, Cane - single point, Environmental consultant - 2 wheels  Functional History: Prior Function Level of Independence: Independent with assistive device(s), Needs assistance ADL's / Homemaking Assistance Needed: has meals delivered, but can do breakfast and coffee, also has cleaning help every other week Comments: independent living cottage; cane; had assistance with cleaning; drives; has had "alot of falls" per daughter - none in past 6 months; does have life alert but she doesn't use it"very non-compliant" per daughter Functional Status:  Mobility: Bed Mobility Overal bed mobility: Needs Assistance Bed Mobility: Rolling, Sidelying to Sit, Sit to  Sidelying Rolling: Mod assist, +2 for safety/equipment Sidelying to sit: HOB elevated, Mod assist, +2 for safety/equipment, +2 for physical assistance Sit to sidelying: +2 for physical assistance, Max assist General bed mobility comments: assist to roll to L due to R UE/LE weakness, assist for side to sit pt helping with L UE pushing on bed; to sidelying, pt able to manage trunk leaning on L elbow, but assist for legs into bed and scooting up with +2 Transfers Overall transfer level: Needs assistance Equipment used: 2 person hand held assist Transfers: Sit to/from Stand Sit to Stand: Max assist, +2 physical assistance General transfer comment: assist for anterior weight shift and cues and assist for L lateral lean due to R LE weakness and leaning R at times, only up for about 10 seconds with R LE forward and knee in hyperextension Ambulation/Gait General Gait Details: pt unable    ADL: ADL Overall ADL's : Needs assistance/impaired Eating/Feeding: NPO Grooming: Moderate assistance Grooming Details (indicate cue type and reason): able to "brush teeth" with toothette after set up using L hand Upper Body Bathing: Moderate assistance, Sitting Lower Body  Bathing: Maximal assistance, Sit to/from stand, Sitting/lateral leans Upper Body Dressing : Maximal assistance, Sitting Lower Body Dressing: Total assistance Toileting- Clothing Manipulation and Hygiene: Total assistance Functional mobility during ADLs: Maximal assistance, +2 for physical assistance  Cognition: Cognition Overall Cognitive Status: Difficult to assess Arousal/Alertness: Awake/alert Orientation Level: Oriented to person Attention: Sustained Sustained Attention: Appears intact (simple, functional tasks) Awareness: Impaired Awareness Impairment: Emergent impairment Cognition Arousal/Alertness: Awake/alert Behavior During Therapy: Flat affect Overall Cognitive Status: Difficult to assess Area of Impairment: Orientation, Attention, Following commands, Problem solving, Safety/judgement, Awareness Orientation Level: Disoriented to, Place, Situation Current Attention Level: Sustained Memory: Decreased short-term memory Following Commands: Follows one step commands consistently, Follows one step commands with increased time Safety/Judgement: Decreased awareness of safety, Decreased awareness of deficits Awareness: Emergent Problem Solving: Slow processing, Decreased initiation General Comments: daughter in the room and reports pt more alert and interactive in therapy session than previously yesterday or today since extubated Difficult to assess due to: Impaired communication  Blood pressure 124/69, pulse 61, temperature 98.8 F (37.1 C), temperature source Axillary, resp. rate 18, height '5\' 5"'$  (1.651 m), weight 84.3 kg, SpO2 96 %. Physical Exam Vitals reviewed.  Constitutional:      General: She is not in acute distress.    Comments: Lethargic  HENT:     Head: Normocephalic and atraumatic.     Right Ear: External ear normal.     Left Ear: External ear normal.     Nose: Nose normal.  Eyes:     General:        Right eye: No discharge.        Left eye: No discharge.      Comments: Only slightly opens eyes  Cardiovascular:     Comments: Irregularly irregular Pulmonary:     Effort: Pulmonary effort is normal. No respiratory distress.     Breath sounds: No stridor.  Abdominal:     General: Abdomen is flat. There is no distension.  Musculoskeletal:     Cervical back: Normal range of motion and neck supple.     Comments: No edema or tenderness in extremities  Skin:    General: Skin is warm and dry.  Neurological:     Comments: Lethargic Appears to have a left gaze preference.   Patient did not follow commands.   Overall examination limited Moving bilateral upper extremities, no spontaneous  movement noted in bilateral lower extremities Appears perseverative  Psychiatric:     Comments: Unable to assess due to mentation/lethargy     Results for orders placed or performed during the hospital encounter of 09/02/19 (from the past 24 hour(s))  CBC     Status: None   Collection Time: 09/04/19  4:33 PM  Result Value Ref Range   WBC 10.0 4.0 - 10.5 K/uL   RBC 4.05 3.87 - 5.11 MIL/uL   Hemoglobin 13.1 12.0 - 15.0 g/dL   HCT 39.4 36 - 46 %   MCV 97.3 80.0 - 100.0 fL   MCH 32.3 26.0 - 34.0 pg   MCHC 33.2 30.0 - 36.0 g/dL   RDW 12.4 11.5 - 15.5 %   Platelets 202 150 - 400 K/uL   nRBC 0.0 0.0 - 0.2 %  Basic metabolic panel     Status: Abnormal   Collection Time: 09/04/19  4:33 PM  Result Value Ref Range   Sodium 134 (L) 135 - 145 mmol/L   Potassium 3.9 3.5 - 5.1 mmol/L   Chloride 97 (L) 98 - 111 mmol/L   CO2 24 22 - 32 mmol/L   Glucose, Bld 118 (H) 70 - 99 mg/dL   BUN 11 8 - 23 mg/dL   Creatinine, Ser 0.71 0.44 - 1.00 mg/dL   Calcium 9.4 8.9 - 10.3 mg/dL   GFR calc non Af Amer >60 >60 mL/min   GFR calc Af Amer >60 >60 mL/min   Anion gap 13 5 - 15  Glucose, capillary     Status: Abnormal   Collection Time: 09/04/19  7:39 PM  Result Value Ref Range   Glucose-Capillary 123 (H) 70 - 99 mg/dL  Glucose, capillary     Status: Abnormal   Collection  Time: 09/04/19 11:15 PM  Result Value Ref Range   Glucose-Capillary 134 (H) 70 - 99 mg/dL  Glucose, capillary     Status: Abnormal   Collection Time: 09/05/19  3:22 AM  Result Value Ref Range   Glucose-Capillary 136 (H) 70 - 99 mg/dL  CBC     Status: None   Collection Time: 09/05/19  5:55 AM  Result Value Ref Range   WBC 9.1 4.0 - 10.5 K/uL   RBC 3.92 3.87 - 5.11 MIL/uL   Hemoglobin 12.6 12.0 - 15.0 g/dL   HCT 37.4 36 - 46 %   MCV 95.4 80.0 - 100.0 fL   MCH 32.1 26.0 - 34.0 pg   MCHC 33.7 30.0 - 36.0 g/dL   RDW 12.0 11.5 - 15.5 %   Platelets 205 150 - 400 K/uL   nRBC 0.0 0.0 - 0.2 %  Basic metabolic panel     Status: Abnormal   Collection Time: 09/05/19  5:55 AM  Result Value Ref Range   Sodium 132 (L) 135 - 145 mmol/L   Potassium 4.0 3.5 - 5.1 mmol/L   Chloride 96 (L) 98 - 111 mmol/L   CO2 26 22 - 32 mmol/L   Glucose, Bld 119 (H) 70 - 99 mg/dL   BUN 15 8 - 23 mg/dL   Creatinine, Ser 0.63 0.44 - 1.00 mg/dL   Calcium 9.3 8.9 - 10.3 mg/dL   GFR calc non Af Amer >60 >60 mL/min   GFR calc Af Amer >60 >60 mL/min   Anion gap 10 5 - 15  Glucose, capillary     Status: Abnormal   Collection Time: 09/05/19  7:41 AM  Result Value Ref Range   Glucose-Capillary  151 (H) 70 - 99 mg/dL  Glucose, capillary     Status: Abnormal   Collection Time: 09/05/19 12:04 PM  Result Value Ref Range   Glucose-Capillary 151 (H) 70 - 99 mg/dL   CT Head Wo Contrast  Result Date: 09/05/2019 CLINICAL DATA:  Stroke follow-up.  Subdural hematoma history. EXAM: CT HEAD WITHOUT CONTRAST TECHNIQUE: Contiguous axial images were obtained from the base of the skull through the vertex without intravenous contrast. COMPARISON:  Brain MRI from 2 days ago FINDINGS: Brain: Essentially stable subarachnoid blood along the lower left sylvian fissure and adjacent sulci. Hazy hemorrhage at the left putamen is also non progressed. Cytotoxic edema is progressively apparent at the left basal ganglia and adjacent white matter  tracks, expected. No interval hemorrhage, hydrocephalus, or masslike finding. Brain atrophy. Vascular: Streak artifact with no detected hyperdense vessel. Skull: Remote burr holes on the right.  No acute finding Sinuses/Orbits: Negative IMPRESSION: Left basal ganglia and inferior division infarcts without detected progression. Unchanged petechial hemorrhage at the basal ganglia and subarachnoid hemorrhage along the sylvian fissure. Electronically Signed   By: Monte Fantasia M.D.   On: 09/05/2019 07:35   MR BRAIN WO CONTRAST  Result Date: 09/03/2019 CLINICAL DATA:  Left MCA stroke post thrombectomy EXAM: MRI HEAD WITHOUT CONTRAST TECHNIQUE: Multiplanar, multiecho pulse sequences of the brain and surrounding structures were obtained without intravenous contrast. COMPARISON:  Recent CT imaging, MRI brain 2018 FINDINGS: Brain: There is reduced diffusion involving the left basal ganglia and adjacent white matter, a small area within the anteromedial left temporal lobe, and small areas of the parietal and posterior left temporal lobes. There is corresponding susceptibility primarily in the basal ganglia region reflecting petechial reperfusion hemorrhage. Diffusion hyperintensity and susceptibility along the left MCA cistern and sylvian fissure are compatible with subarachnoid hemorrhage. There is trace layering intraventricular hemorrhage in the occipital horns likely reflecting redistribution of the subarachnoid hemorrhage. Prominence of the ventricles and sulci reflects generalized parenchymal volume loss. Confluent areas of T2 hyperintensity in the supratentorial greater than pontine white matter are nonspecific but probably reflect advanced chronic microvascular ischemic changes. There is no significant mass effect. Vascular: Major vessel flow voids at the skull base are preserved. Skull and upper cervical spine: Normal marrow signal is preserved. Sinuses/Orbits: Mild mucosal thickening. Bilateral lens  replacements. Other: Sella is unremarkable.  Mastoid air cells are clear. IMPRESSION: Acute infarcts of the left MCA territory involving the basal ganglia and adjacent white matter and parietotemporal lobes. Petechial reperfusion hemorrhage primarily involving the basal ganglia and small volume subarachnoid hemorrhage as seen on CT. Advanced chronic microvascular ischemic changes. Electronically Signed   By: Macy Mis M.D.   On: 09/03/2019 14:35   IR CT Head Ltd  Result Date: 09/04/2019 INDICATION: 85 year old female with past medical history significant for atrial fibrillation (not on anticoagulation due to prior subdural hematoma), hypertension, hyperlipidemia, heart disease, mild cognitive impairment of vascular etiology complicated by alcohol use with a premorbid Rankin scale of 0. She presented to ED with aphasia, left gaze deviation and nonrhythmic shaking of the left side, NIHSS 22. She was last known well at 2:30 p.m. on 09/01/2019. No IV tPA administered as patient was outside of the window. Head CT showed prominent white matter disease and atrophy with hyperdense left MCA without large acute infarct or hemorrhage. CT angiogram of the head and neck showed a proximal left M1/MCA occlusion extending into the M2 segment. CT perfusion showed a core infarct of 4 mL with a penumbra of  83 mL. She was then transferred to our service for a diagnostic cerebral angiogram and emergency mechanical thrombectomy. EXAM: DIAGNOSTIC CEREBRAL ANGIOGRAM MECHANICAL THROMBECTOMY FLAT PANEL HEAD CT COMPARISON:  CT/CT angiogram of the head and neck September 02, 2019 MEDICATIONS: No antibiotics given. ANESTHESIA/SEDATION: The procedure was performed in the general anesthesia. FLUOROSCOPY TIME:  Fluoroscopy Time: 31 minutes 12 seconds (678 mGy). COMPLICATIONS: None immediate. TECHNIQUE: Informed written consent was obtained from the patient's daughter after a thorough discussion of the procedural risks, benefits and  alternatives. All questions were addressed. Maximal Sterile Barrier Technique was utilized including caps, mask, sterile gowns, sterile gloves, sterile drape, hand hygiene and skin antiseptic. A timeout was performed prior to the initiation of the procedure. The right groin was prepped and draped in the usual sterile fashion. Using a micropuncture kit and the modified Seldinger technique, access was gained to the right common femoral artery and an 8 French sheath was placed. Under fluoroscopy, an 8 [place] Walrus balloon guide catheter was navigated over a 6 [place] VTK catheter and a 0.035" Terumo Glidewire into the aortic arch. The catheter was placed into the left common carotid artery and then advanced into the left internal carotid artery. The inner catheter was removed. Frontal and lateral angiograms of the head were obtained. FINDINGS: Occlusion of the left middle cerebral artery at its origin. Fair collateral flow is seen from the left ACA to the left MCA vascular tree. PROCEDURE: Under biplane roadmap, a zoom 71 aspiration catheter was navigated over a phenom 21 microcatheter and a synchro support microguidewire into the cavernous segment of the right ICA. The microcatheter was then navigated over the wire into the left M2/MCA inferior division branch. Then, a 5 x 37 mm embotrap stent retriever was deployed spanning the M1 and M2 segments. The device was allowed to intercalated with the clot for 4 minutes. The microcatheter was removed. The aspiration catheter was advanced to the level of occlusion and connected to a penumbra aspiration pump. The guiding catheter balloon was inflated. The thrombectomy device and aspiration catheter were removed under constant aspiration. Follow-up left ICA angiogram showed recanalization of most of the M1 segment with occlusion at the level of the bifurcation. Under biplane roadmap, a zoom 71 aspiration catheter was navigated over a phenom 21 microcatheter and a synchro  support microguidewire into the cavernous segment of the right ICA. The microcatheter was then navigated over the wire into the left M2/MCA superior division branch. Then, a 5 x 37 mm embotrap stent retriever was deployed spanning the M1 and M2 segments. The device was allowed to intercalated with the clot for 4 minutes. The microcatheter was removed. The aspiration catheter was advanced to the level of occlusion and connected to a penumbra aspiration pump. The guiding catheter balloon was inflated. The thrombectomy device and aspiration catheter were removed under constant aspiration. Follow-up left ICA angiogram showed recanalization of the M1 segment and superior division with persistent occlusion of the inferior division. Under biplane roadmap, a zoom 71 aspiration catheter was navigated over a phenom 21 microcatheter and a synchro support microguidewire into the cavernous segment of the right ICA. The microcatheter was then navigated over the wire into the left M2/MCA inferior division branch. Then, a 5 x 37 mm embotrap stent retriever was deployed spanning the M1 and M2 segments. The device was allowed to intercalated with the clot for 4 minutes. The microcatheter was removed. The aspiration catheter was advanced to the level of occlusion and connected to a penumbra aspiration  pump. The guiding catheter balloon was inflated. The thrombectomy device and aspiration catheter were removed under constant aspiration. Follow-up left ICA angiogram showed complete recanalization of the left MCA vascular tree (TICI3). Flat panel CT of the head was obtained and post processed in a separate workstation with concurrent attending physician supervision. Selected images were sent to PACS. Contrast staining in the left basal ganglia was noted. Small subarachnoid hemorrhage versus contrast extravasation in the left sylvian fissure. Delayed follow-up angiogram showed persistent patency of the left MCA vascular tree with no  evidence of embolus to new or distal territory. The catheter was subsequently withdrawn. Right femoral artery angiograms were obtained with right anterior oblique and lateral views. The puncture is at the level of the common femoral artery. The femoral sheath was exchanged over the wire for a Perclose ProGlide which was utilized for access closure. Immediate hemostasis was achieved. IMPRESSION: 1. Successful mechanical thrombectomy for treatment of a left M1/MCA proximal occlusion. Three passes performed with combined technique (aspiration left stent retriever) with complete recanalization. 2. No embolus to new or distal territory. 3. Small left sylvian subarachnoid hemorrhage versus process extravasation. PLAN: - Transfer to ICU- Maintain intubated as no covid-19 test result available- SBP 120-140 mmHg- Bed rest post femoral access x6 hours Electronically Signed   By: Pedro Earls M.D.   On: 09/04/2019 13:09   IR PERCUTANEOUS ART THROMBECTOMY/INFUSION INTRACRANIAL INC DIAG ANGIO  Result Date: 09/04/2019 INDICATION: 86 year old female with past medical history significant for atrial fibrillation (not on anticoagulation due to prior subdural hematoma), hypertension, hyperlipidemia, heart disease, mild cognitive impairment of vascular etiology complicated by alcohol use with a premorbid Rankin scale of 0. She presented to ED with aphasia, left gaze deviation and nonrhythmic shaking of the left side, NIHSS 22. She was last known well at 2:30 p.m. on 09/01/2019. No IV tPA administered as patient was outside of the window. Head CT showed prominent white matter disease and atrophy with hyperdense left MCA without large acute infarct or hemorrhage. CT angiogram of the head and neck showed a proximal left M1/MCA occlusion extending into the M2 segment. CT perfusion showed a core infarct of 4 mL with a penumbra of 83 mL. She was then transferred to our service for a diagnostic cerebral angiogram and  emergency mechanical thrombectomy. EXAM: DIAGNOSTIC CEREBRAL ANGIOGRAM MECHANICAL THROMBECTOMY FLAT PANEL HEAD CT COMPARISON:  CT/CT angiogram of the head and neck September 02, 2019 MEDICATIONS: No antibiotics given. ANESTHESIA/SEDATION: The procedure was performed in the general anesthesia. FLUOROSCOPY TIME:  Fluoroscopy Time: 31 minutes 12 seconds (678 mGy). COMPLICATIONS: None immediate. TECHNIQUE: Informed written consent was obtained from the patient's daughter after a thorough discussion of the procedural risks, benefits and alternatives. All questions were addressed. Maximal Sterile Barrier Technique was utilized including caps, mask, sterile gowns, sterile gloves, sterile drape, hand hygiene and skin antiseptic. A timeout was performed prior to the initiation of the procedure. The right groin was prepped and draped in the usual sterile fashion. Using a micropuncture kit and the modified Seldinger technique, access was gained to the right common femoral artery and an 8 French sheath was placed. Under fluoroscopy, an 8 [place] Walrus balloon guide catheter was navigated over a 6 [place] VTK catheter and a 0.035" Terumo Glidewire into the aortic arch. The catheter was placed into the left common carotid artery and then advanced into the left internal carotid artery. The inner catheter was removed. Frontal and lateral angiograms of the head were obtained. FINDINGS: Occlusion of  the left middle cerebral artery at its origin. Fair collateral flow is seen from the left ACA to the left MCA vascular tree. PROCEDURE: Under biplane roadmap, a zoom 71 aspiration catheter was navigated over a phenom 21 microcatheter and a synchro support microguidewire into the cavernous segment of the right ICA. The microcatheter was then navigated over the wire into the left M2/MCA inferior division branch. Then, a 5 x 37 mm embotrap stent retriever was deployed spanning the M1 and M2 segments. The device was allowed to intercalated with  the clot for 4 minutes. The microcatheter was removed. The aspiration catheter was advanced to the level of occlusion and connected to a penumbra aspiration pump. The guiding catheter balloon was inflated. The thrombectomy device and aspiration catheter were removed under constant aspiration. Follow-up left ICA angiogram showed recanalization of most of the M1 segment with occlusion at the level of the bifurcation. Under biplane roadmap, a zoom 71 aspiration catheter was navigated over a phenom 21 microcatheter and a synchro support microguidewire into the cavernous segment of the right ICA. The microcatheter was then navigated over the wire into the left M2/MCA superior division branch. Then, a 5 x 37 mm embotrap stent retriever was deployed spanning the M1 and M2 segments. The device was allowed to intercalated with the clot for 4 minutes. The microcatheter was removed. The aspiration catheter was advanced to the level of occlusion and connected to a penumbra aspiration pump. The guiding catheter balloon was inflated. The thrombectomy device and aspiration catheter were removed under constant aspiration. Follow-up left ICA angiogram showed recanalization of the M1 segment and superior division with persistent occlusion of the inferior division. Under biplane roadmap, a zoom 71 aspiration catheter was navigated over a phenom 21 microcatheter and a synchro support microguidewire into the cavernous segment of the right ICA. The microcatheter was then navigated over the wire into the left M2/MCA inferior division branch. Then, a 5 x 37 mm embotrap stent retriever was deployed spanning the M1 and M2 segments. The device was allowed to intercalated with the clot for 4 minutes. The microcatheter was removed. The aspiration catheter was advanced to the level of occlusion and connected to a penumbra aspiration pump. The guiding catheter balloon was inflated. The thrombectomy device and aspiration catheter were removed  under constant aspiration. Follow-up left ICA angiogram showed complete recanalization of the left MCA vascular tree (TICI3). Flat panel CT of the head was obtained and post processed in a separate workstation with concurrent attending physician supervision. Selected images were sent to PACS. Contrast staining in the left basal ganglia was noted. Small subarachnoid hemorrhage versus contrast extravasation in the left sylvian fissure. Delayed follow-up angiogram showed persistent patency of the left MCA vascular tree with no evidence of embolus to new or distal territory. The catheter was subsequently withdrawn. Right femoral artery angiograms were obtained with right anterior oblique and lateral views. The puncture is at the level of the common femoral artery. The femoral sheath was exchanged over the wire for a Perclose ProGlide which was utilized for access closure. Immediate hemostasis was achieved. IMPRESSION: 1. Successful mechanical thrombectomy for treatment of a left M1/MCA proximal occlusion. Three passes performed with combined technique (aspiration left stent retriever) with complete recanalization. 2. No embolus to new or distal territory. 3. Small left sylvian subarachnoid hemorrhage versus process extravasation. PLAN: - Transfer to ICU- Maintain intubated as no covid-19 test result available- SBP 120-140 mmHg- Bed rest post femoral access x6 hours Electronically Signed   By:  Pedro Earls M.D.   On: 09/04/2019 13:09    Assessment/Plan: Diagnosis: Left putamen nonhemorrhagic infarct Stroke: Continue secondary stroke prophylaxis and Risk Factor Modification listed below:   Blood Pressure Management:  Continue current medication with prn's with permisive HTN per primary team Prediabetes management:   ?hemiparesis: fit for orthosis to prevent contractures (resting hand splint for day, wrist cock up splint at night, PRAFO, etc) PT/OT for mobility, ADL training  Labs and images  (see above) independently reviewed.  Records reviewed and summated above.  1. Does the need for close, 24 hr/day medical supervision in concert with the patient's rehab needs make it unreasonable for this patient to be served in a less intensive setting? Yes 2. Co-Morbidities requiring supervision/potential complications: atrial fibrillation (anticoagulation on hold due to history of SDH), HTN (monitor and provide prns in accordance with increased physical exertion and pain), hyperlipidemia, CAD, mild cognitive impairment of vascular etiology, alcohol use (counsel when appropriate), hyponatremia (repeat labs, treat if necessary) 3. Due to bladder management, bowel management, safety, skin/wound care, disease management, medication administration, pain management and patient education, does the patient require 24 hr/day rehab nursing? Yes 4. Does the patient require coordinated care of a physician, rehab nurse, therapy disciplines of PT/OT/SLP to address physical and functional deficits in the context of the above medical diagnosis(es)? Yes Addressing deficits in the following areas: balance, endurance, locomotion, strength, transferring, bowel/bladder control, bathing, dressing, feeding, grooming, toileting, cognition, speech, language, swallowing and psychosocial support 5. Can the patient actively participate in an intensive therapy program of at least 3 hrs of therapy per day at least 5 days per week? Potentially 6. The potential for patient to make measurable gains while on inpatient rehab is excellent 7. Anticipated functional outcomes upon discharge from inpatient rehab are supervision and min assist  with PT, supervision and min assist with OT, modified independent and supervision with SLP. 8. Estimated rehab length of stay to reach the above functional goals is: 20-24 days. 9. Anticipated discharge destination: Home 10. Overall Rehab/Functional Prognosis: good  RECOMMENDATIONS: This patient's  condition is appropriate for continued rehabilitative care in the following setting: CIR when medically stable able tolerate 3 hours of therapy per day. Patient has agreed to participate in recommended program. Potentially Note that insurance prior authorization may be required for reimbursement for recommended care.  Comment: Rehab Admissions Coordinator to follow up.  I have personally performed a face to face diagnostic evaluation, including, but not limited to relevant history and physical exam findings, of this patient and developed relevant assessment and plan.  Additionally, I have reviewed and concur with the physician assistant's documentation above.   Delice Lesch, MD, ABPMR  Lavon Paganini Angiulli, PA-C 09/05/2019

## 2019-09-05 NOTE — Progress Notes (Signed)
NAME:  [name], MRN:  [id number], DOB:  1932-03-19, LOS: 3 ADMISSION DATE:  09/02/2019, CONSULTATION DATE:  09/02/2019 REFERRING MD:  Curly [place], CHIEF COMPLAINT:  Intubation for airway protection   Brief History   86 year old female with PMHx of atrial fibrillation (not on anticoagulation due to prior subdural hematoma and frequent falls), hypertension, hyperlipidemia, heart disease and mild cognitive impairment of vascular etiology complicated by alcohol use admitted for AMS, found to have a left MCA stroke and is now s/p thrombectomy. PCCM consulted for post-intubation airway protection and vent management.   Past Medical History   Past Medical History:  Diagnosis Date  . Arthritis   . Atrial fibrillation ([place])   . Cataract    BILATERAL  . DDD (degenerative disc disease), cervical   . Depression   . Diverticulosis   . Falls   . GERD (gastroesophageal reflux disease)   . Glaucoma   . Headache   . Hyperlipidemia   . Hyperlipidemia   . Hypertension   . Osteoporosis   . Peripheral neuropathy   . Spondylosis    lumbar  . Subdural hematoma ([place])    Significant Hospital Events   8/14 > admitted with left MCA stroke; intubated and underwent diagnostic cerebral angiogram and mechanical thrombectomy 8/16 > extubated   Consults:  PCCM Neurology  Procedures:  ETT 8/14>8/16 Mechanical thrombectomy 8/14  Significant Diagnostic Tests:  CT Head Wo Contrast/Angio Head and Neck/Cerebral Perfusion w Contrast 8/14 > No acute intracranial hemorrhage. 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 ASPECT score of 8. There is nearly occlusive thrombus within the proximal left M1 MCA extending into an M2 branch. Perfusion imaging demonstrates core infarction of 4 mL (corresponds to noncontrast CT finding) and penumbra of 83 mL in the left MCA territory. CT Head wo Contrast 8/15 > New hyperdensity of the previously hypodense left basal ganglia may reflect petechial  reperfusion hemorrhage or contrast staining. Hyperdensity along the left MCA and sylvian fissure likely reflects small volume subarachnoid hemorrhage. MRI Brain 8/15 > Acute infarcts of the left MCA territory involving the basal ganglia and adjacent white matter and parietotemporal lobes. Petechial reperfusion hemorrhage primarily involving the basal ganglia and small volume subarachnoid hemorrhage as seen on CT. Echo 8/15 > LVEF 60-65%, no RWMA; unable to evaluate LV diastolic function; trivial mitral valve regurgitation without stenosis; mild aortic valve stenosis. No intracardiac source of embolism identified other than a.fib rhythm  CT Head wo Contrast 8/17 > Left basal ganglia and inferior division infarcts without detected progression. Unchanged petechial hemorrhage at the basal ganglia and subarachnoid hemorrhage along the sylvian fissure.  Micro Data:  SARS CoV2 - Negative  Antimicrobials:  N/A   Interim history/subjective:  Patient successfully extubated yesterday. Ongoing aphasia and right sided hemiplegia; Able to participate with PT/OT. Continues to have significant speech/swallow impairment for which Cortrak placed.   Objective   Blood pressure (!) 154/72, pulse 64, temperature 99.1 F (37.3 C), temperature source Axillary, resp. rate 19, height 5\' 5"  (1.651 m), weight 84.3 kg, SpO2 96 %.        Intake/Output Summary (Last 24 hours) at 09/05/2019 0845 Last data filed at 09/05/2019 0700 Gross per 24 hour  Intake 1451.35 ml  Output 2425 ml  Net -973.65 ml   Filed Weights   09/02/19 1210  Weight: 84.3 kg    Examination: General: elderly female, somnolent but arousable to voice; no acute distress  HENT: [place]/AT, PERRL, left gaze preference; R sided facial  droop Lungs: clear to auscultation bilaterally Cardiovascular: irregular rhythm, regular rate; S1 and S2 present, no m/r/g Abdomen: soft, nondistended, minimal tenderness to palpation without guarding or rebound  tenderness; +bowel sounds  Extremities: warm and dry; distal pulses intact Neuro: somnolent but arousable to voice; intermittently following commands; left sided gaze deviation; right upper and lower extremity flaccid paralysis; sensation grossly intact in bilateral upper and lower extremities  Skin: No lesions or rashes appreciated   Resolved Hospital Problem list   Acute respiratory insufficiency due to stroke   Assessment & Plan:  Acute Left MCA Stroke s/p thrombectomy:  Continued aphasia and R flaccid hemiplegia.  - Secondary stroke prevention  - Continue PT/OT, evaluation for CIR - Continue SLP eval for swallow function; may need to consider PEG tube placement if no significant improvement   Chronic atrial fibrillation not on anticoagulation CHADS2-VASc Score 6; HASBLED score 3 given history of prior subdural hematoma, advanced age and extensive alcohol use.  She is high risk for major bleeding on anticoagulation. Further discussion per stroke team recommendations.   Hypertension Hyperlipidemia - Continue metoprolol, amlodipine and cozaar. Will need medication adjustment for BP goal normotensive prior to discharge - Continue lipitor 40mg  daily  Best practice:  Diet: tube feeds Pain/Anxiety/Delirium protocol (if indicated): n/a  VAP protocol (if indicated): n/a  DVT prophylaxis: SCDs GI prophylaxis: PPI Glucose control: n/a Mobility: OOB as tolerated with PT/OT Code Status: DNR/DNI Family Communication: Daughter updated at bedside  Disposition: Floors per primary team; discussed with Dr. Leonie Man   Critical care time: 30 minutes

## 2019-09-06 ENCOUNTER — Inpatient Hospital Stay (HOSPITAL_COMMUNITY): Payer: Medicare Other

## 2019-09-06 DIAGNOSIS — I4891 Unspecified atrial fibrillation: Secondary | ICD-10-CM

## 2019-09-06 DIAGNOSIS — R5383 Other fatigue: Secondary | ICD-10-CM | POA: Diagnosis not present

## 2019-09-06 DIAGNOSIS — R569 Unspecified convulsions: Secondary | ICD-10-CM

## 2019-09-06 DIAGNOSIS — E871 Hypo-osmolality and hyponatremia: Secondary | ICD-10-CM | POA: Diagnosis present

## 2019-09-06 DIAGNOSIS — E785 Hyperlipidemia, unspecified: Secondary | ICD-10-CM | POA: Diagnosis present

## 2019-09-06 DIAGNOSIS — I639 Cerebral infarction, unspecified: Secondary | ICD-10-CM | POA: Diagnosis not present

## 2019-09-06 DIAGNOSIS — F1010 Alcohol abuse, uncomplicated: Secondary | ICD-10-CM

## 2019-09-06 LAB — URINALYSIS, ROUTINE W REFLEX MICROSCOPIC
Bilirubin Urine: NEGATIVE
Glucose, UA: NEGATIVE mg/dL
Ketones, ur: NEGATIVE mg/dL
Nitrite: NEGATIVE
Protein, ur: NEGATIVE mg/dL
Specific Gravity, Urine: 1.01 (ref 1.005–1.030)
pH: 8 (ref 5.0–8.0)

## 2019-09-06 LAB — CBC
HCT: 37.2 % (ref 36.0–46.0)
Hemoglobin: 12.7 g/dL (ref 12.0–15.0)
MCH: 32.3 pg (ref 26.0–34.0)
MCHC: 34.1 g/dL (ref 30.0–36.0)
MCV: 94.7 fL (ref 80.0–100.0)
Platelets: 210 10*3/uL (ref 150–400)
RBC: 3.93 MIL/uL (ref 3.87–5.11)
RDW: 11.9 % (ref 11.5–15.5)
WBC: 8.7 10*3/uL (ref 4.0–10.5)
nRBC: 0 % (ref 0.0–0.2)

## 2019-09-06 LAB — GLUCOSE, CAPILLARY
Glucose-Capillary: 120 mg/dL — ABNORMAL HIGH (ref 70–99)
Glucose-Capillary: 130 mg/dL — ABNORMAL HIGH (ref 70–99)
Glucose-Capillary: 133 mg/dL — ABNORMAL HIGH (ref 70–99)
Glucose-Capillary: 137 mg/dL — ABNORMAL HIGH (ref 70–99)
Glucose-Capillary: 157 mg/dL — ABNORMAL HIGH (ref 70–99)
Glucose-Capillary: 158 mg/dL — ABNORMAL HIGH (ref 70–99)
Glucose-Capillary: 167 mg/dL — ABNORMAL HIGH (ref 70–99)

## 2019-09-06 LAB — BASIC METABOLIC PANEL
Anion gap: 9 (ref 5–15)
BUN: 19 mg/dL (ref 8–23)
CO2: 26 mmol/L (ref 22–32)
Calcium: 9.1 mg/dL (ref 8.9–10.3)
Chloride: 96 mmol/L — ABNORMAL LOW (ref 98–111)
Creatinine, Ser: 0.64 mg/dL (ref 0.44–1.00)
GFR calc Af Amer: >60 mL/min (ref >60)
GFR calc non Af Amer: >60 mL/min (ref >60)
Glucose, Bld: 141 mg/dL — ABNORMAL HIGH (ref 70–99)
Potassium: 3.7 mmol/L (ref 3.5–5.1)
Sodium: 131 mmol/L — ABNORMAL LOW (ref 135–145)

## 2019-09-06 MED ORDER — TRIAMCINOLONE ACETONIDE 0.1 % EX CREA
1.0000 "application " | TOPICAL_CREAM | Freq: Every day | CUTANEOUS | Status: DC | PRN
  Filled 2019-09-06: qty 15

## 2019-09-06 MED ORDER — LEVETIRACETAM IN NACL 1500 MG/100ML IV SOLN: 1500.0000 mg | Freq: Once | INTRAVENOUS | Status: DC

## 2019-09-06 MED ORDER — AMANTADINE HCL 100 MG PO CAPS
100.0000 mg | ORAL_CAPSULE | Freq: Every day | ORAL | Status: DC
  Filled 2019-09-06 (×2): qty 1

## 2019-09-06 MED ORDER — AMANTADINE HCL 50 MG/5ML PO SYRP
100.0000 mg | ORAL_SOLUTION | Freq: Every day | ORAL | Status: DC
  Administered 2019-09-06 – 2019-09-07 (×2): 100 mg
  Filled 2019-09-06 (×3): qty 10

## 2019-09-06 MED ORDER — LATANOPROST 0.005 % OP SOLN
1.0000 [drp] | Freq: Every day | OPHTHALMIC | Status: DC
  Administered 2019-09-06: 1 [drp] via OPHTHALMIC
  Filled 2019-09-06: qty 2.5

## 2019-09-06 MED ORDER — LEVETIRACETAM 500 MG PO TABS: 500.0000 mg | ORAL_TABLET | Freq: Two times a day (BID) | ORAL | Status: DC

## 2019-09-06 NOTE — Plan of Care (Signed)

## 2019-09-06 NOTE — Progress Notes (Signed)
EEG complete - results pending 

## 2019-09-06 NOTE — Progress Notes (Signed)
OT Cancellation Note  Patient Details Name: [name] MRN: [id number] DOB: 1932-04-01   Cancelled Treatment:    Reason Eval/Treat Not Completed: Patient at procedure or test/ unavailable, per RN patient currently getting EEG applied.  Will follow and see as able.   Jolaine Artist, OT Acute Rehabilitation Services Pager [phone] Office [phone]   Delight Stare 09/06/2019, 1:58 PM

## 2019-09-06 NOTE — Progress Notes (Signed)
Pt's Amantadine not admin d/t pt has cortrak and med form can not be crushed. Dr. Lorrin Goodell updated. Med opened and waist,

## 2019-09-06 NOTE — Procedures (Signed)
Patient Name: Charvi ZAYANNA PUNDT  MRN: [id number]  Epilepsy Attending: Lora Havens  Referring Physician/Provider: Burnetta Sabin, NP Date: 09/06/2019 Duration: 21.30 mins  Patient history: 86 year old female who presented with speech disturbance, left-sided gaze deviation and nonrhythmic shaking of left side.  CT showed left MCA acute infarct.  EEG to assess for seizures.  Level of alertness: Awake  AEDs during EEG study: None  Technical aspects: This EEG study was done with scalp electrodes positioned according to the 10-20 International system of electrode placement. Electrical activity was acquired at a sampling rate of 500Hz  and reviewed with a high frequency filter of 70Hz  and a low frequency filter of 1Hz . EEG data were recorded continuously and digitally stored.   Description: The posterior dominant rhythm consists of 8 Hz activity of moderate voltage (25-35 uV) seen predominantly in posterior head regions, symmetric and reactive to eye opening and eye closing. EEG showed continuous generalized and lateralized left hemisphere 3 to 6 Hz theta-delta slowing with overriding 13-15hz  generalized beta activity. Hyperventilation and photic stimulation were not performed.     ABNORMALITY -Continuous slow, generalized and lateralized left hemisphere  IMPRESSION: This study is suggestive of cortical dysfunction in left hemisphere likely secondary to underlying infarct as well as mild to moderate diffuse encephalopathy, nonspecific etiology. No seizures or definite epileptiform discharges were seen throughout the recording.   Alyra Patty Barbra Sarks

## 2019-09-06 NOTE — Progress Notes (Signed)
Physical Therapy Treatment Patient Details Name: [name] MRN: [id number] DOB: 02/02/1932 Today's Date: 09/06/2019    History of Present Illness 86 year old female with past medical history of A. fib not on anticoagulation due to frequent falling and prior subdural hematoma, hypertension, memory loss, GERD who was admitted to the hospital for altered mental status, found to have a left MCA stroke and is now status post thrombectomy. Extubated 8/16.     PT Comments    Pt was better able to get to EOB today to her right and did well sitting EOB for >20 mins working on exercises, ADLs and sitting balance.  She remains very appropriate for post acute rehab.  PT will continue to follow acutely for safe mobility progression.  Try standing with two person assist and steady standing frame.    Follow Up Recommendations  CIR     Equipment Recommendations  Wheelchair (measurements PT);Wheelchair cushion (measurements PT);Hospital bed    Recommendations for Other Services       Precautions / Restrictions Precautions Precautions: Fall Precaution Comments: corerack; R inattention    Mobility  Bed Mobility Overal bed mobility: Needs Assistance Bed Mobility: Rolling;Sidelying to Sit;Sit to Supine Rolling: Mod assist Sidelying to sit: Mod assist;HOB elevated   Sit to supine: Max assist   General bed mobility comments: Pt was able, with multimodal cues to roll to her right using R bed rail, assist in progressing her left leg over to the side of the bed and scoot, Mod assist overall to help progress trunk up to sitting and scoot the rest of the way to EOB.  Max assist to return to supine due to assist at both trunk and legs.    Transfers                 General transfer comment: would try the stedy next session with second person assist.   Ambulation/Gait                 Stairs             Wheelchair Mobility    Modified Rankin (Stroke Patients  Only) Modified Rankin (Stroke Patients Only) Pre-Morbid Rankin Score: Moderate disability Modified Rankin: Severe disability     Balance Overall balance assessment: Needs assistance Sitting-balance support: Feet supported;Bilateral upper extremity supported Sitting balance-Leahy Scale: Poor Sitting balance - Comments: up to mod assist EOB, but as good as min guard assist for sitting balance.  Practiced leaning to both sides and coming back up x 10, sitting in midline, LE exercises, UE exercises, and ADL task of combing her hair with her left hand and using the [place] suction with her left hand.  Postural control: Right lateral lean                                  Cognition Arousal/Alertness: Awake/alert Behavior During Therapy: WFL for tasks assessed/performed Overall Cognitive Status: Impaired/Different from baseline Area of Impairment: Attention;Memory;Following commands;Safety/judgement;Awareness;Problem solving                   Current Attention Level: Sustained Memory: Decreased short-term memory Following Commands: Follows one step commands with increased time Safety/Judgement: Decreased awareness of deficits Awareness: Intellectual Problem Solving: Slow processing;Decreased initiation General Comments: Pt often saying "yes" without consistent accuracy.  multimodal cues for understandinhg, when testing sensation, pt said "yes" throughout even when not touching, R inattention.  Exercises General Exercises - Upper Extremity Shoulder Flexion: AAROM;Right;Left;10 reps Elbow Flexion: AAROM;Right;PROM;Left;10 reps General Exercises - Lower Extremity Ankle Circles/Pumps: PROM;Right;10 reps;Supine Long Arc Quad: AROM;Left;PROM;Right;10 reps Heel Slides: PROM;Right;10 reps;Supine Hip Flexion/Marching: AROM;Left;10 reps;Seated Toe Raises: AROM;Left;10 reps;Seated    General Comments        Pertinent Vitals/Pain Pain Assessment: Faces Faces Pain  Scale: No hurt    Home Living                      Prior Function            PT Goals (current goals can now be found in the care plan section) Acute Rehab PT Goals Patient Stated Goal: daughter interested in CIR Progress towards PT goals: Progressing toward goals    Frequency    Min 4X/week      PT Plan Current plan remains appropriate    Co-evaluation              AM-PAC PT "6 Clicks" Mobility   Outcome Measure  Help needed turning from your back to your side while in a flat bed without using bedrails?: A Lot Help needed moving from lying on your back to sitting on the side of a flat bed without using bedrails?: A Lot Help needed moving to and from a bed to a chair (including a wheelchair)?: Total Help needed standing up from a chair using your arms (e.g., wheelchair or bedside chair)?: Total Help needed to walk in hospital room?: Total Help needed climbing 3-5 steps with a railing? : Total 6 Click Score: 8    End of Session   Activity Tolerance: Patient limited by fatigue Patient left: in bed;with call bell/phone within reach;with family/visitor present (daughter in room)   PT Visit Diagnosis: Other abnormalities of gait and mobility (R26.89);Hemiplegia and hemiparesis;Other symptoms and signs involving the nervous system (R29.898) Hemiplegia - Right/Left: Right Hemiplegia - dominant/non-dominant: Dominant Hemiplegia - caused by: Cerebral infarction     Time: 2563-8937 PT Time Calculation (min) (ACUTE ONLY): 46 min  Charges:  $Therapeutic Activity: 8-22 mins $Neuromuscular Re-education: 23-37 mins                     Verdene Lennert, PT, DPT  Acute Rehabilitation [phone] pager [phone]) [phone] office

## 2019-09-06 NOTE — Progress Notes (Signed)
PT Cancellation Note  Patient Details Name: [name] MRN: [id number] DOB: 04-Apr-1932   Cancelled Treatment:    Reason Eval/Treat Not Completed: Patient at procedure or test/unavailable.  EEG tech placing EEG wires.  WIll check to see if it is short term or 24 hours and come back later if it is short term.   Thanks,  Verdene Lennert, PT, DPT  Acute Rehabilitation [phone] pager #[phone] office       [name] 09/06/2019, 1:59 PM

## 2019-09-06 NOTE — Progress Notes (Signed)
IP rehab admissions - I met with daughter at the bedside.  Patient was asleep.  Daughter does want CIR.  Will await medical readiness.  I do anticipate bed availability tomorrow.  I will follow up in am.  Call me for questions.  [phone]

## 2019-09-06 NOTE — Progress Notes (Signed)
STROKE TEAM PROGRESS NOTE   INTERVAL HISTORY Patient daughter is at the bedside.  Daughter concerned about respiratory status. Pt more awake reading a magazine earlier, now very lethargic. Marland Kitchen Daughter is at the bedside.  She is concerned that patient was more arousable and alert few days ago and she seems to be declining.  Chart review shows that she has been afebrile,, white count has been normal.  Chest x-ray done today shows atelectasis but no definite infiltrates.  UA is unremarkable.  CT scan and EEG are pending  OBJECTIVE Vitals:   09/06/19 0411 09/06/19 0422 09/06/19 0800 09/06/19 1048  BP:  (!) 173/80 (!) 141/73 (!) 180/101  Pulse:  67 76 67  Resp:  20 16 19   Temp:  (!) 97.4 F (36.3 C) 98.9 F (37.2 C) 97.9 F (36.6 C)  TempSrc:  Oral Oral Axillary  SpO2:  98% 98% 98%  Weight: 81.4 kg     Height:       CBC:  Recent Labs  Lab 09/02/19 1130 09/02/19 1134 09/05/19 0555 09/06/19 0157  WBC 8.4   < > 9.1 8.7  NEUTROABS 6.7  --   --   --   HGB 14.9   < > 12.6 12.7  HCT 47.0*   < > 37.4 37.2  MCV 99.8   < > 95.4 94.7  PLT 232   < > 205 210   < > = values in this interval not displayed.   Basic Metabolic Panel:  Recent Labs  Lab 09/05/19 0555 09/06/19 0157  NA 132* 131*  K 4.0 3.7  CL 96* 96*  CO2 26 26  GLUCOSE 119* 141*  BUN 15 19  CREATININE 0.63 0.64  CALCIUM 9.3 9.1   IMAGING No results found.    PHYSICAL EXAM     Pleasant elderly Caucasian lady  Not in distress. . Afebrile. Head is nontraumatic. Neck is supple without bruit.    Cardiac exam no murmur or gallop. Lungs are clear to auscultation. Distal pulses are well felt.  She is wearing a neck collar Neurological Exam : Patient is drowsy but can be aroused.  She appears to be aphasic and does not follow commands consistently.  She speaks only occasional words.  Has left gaze preference and she will not look to the right past midline.  She blinks to threat on the left but not on the right.  Right lower  facial weakness.  Tongue midline.  She moves left upper and lower extremities purposefully against gravity.  Right upper extremity is flaccid and only trace withdrawal to pain.  She does withdraw right lower extremity pain but not voluntarily.  Deep tendon reflexes are depressed on the right and present on the left right plantar upgoing left downgoing.  Gait not tested   ASSESSMENT/PLAN [name] is a 86 y.o. female with history of atrial fibrillation (not on anticoagulation secondary to prior subdural hematoma), hypertension, hyperlipidemia, heart disease, mild cognitive impairment of vascular etiology complicated by alcohol use presenting with speech disturbance, left-sided gaze deviation and nonrhythmic shaking of the left side. She did not receive IV t-PA due to late presentation. IR - Proximal left M1/MCA occlusion. 3 passes aspiration + stent retriever with complete recanalization. Small right sylvian fissure SAH post procedure.  Stroke:  Left MCA territory acute infarction s/p IR w/ SAH - embolic - atrial fibrillation  Resultant aphasia and right hemiplegia  Code Stroke CT Head -  Left MCA territory acute infarction involving the  basal ganglia. ASPECT score of 8.    CTA H&N - nearly occlusive thrombus within the proximal left M1 MCA extending into an M2 branch  CT Perfusion - core infarction of 4 mL (corresponds to noncontrast CT finding) and penumbra of 83 mL in the left MCA territory  CT head - Small right sylvian fissure SAH on post procedural flat panel CT.  MRI head - L MCA infarcts w/ L basal ganglia petechial hemorrhage or contrast staining, SAH  2D Echo - AF, EF 60-65%, no other SOE  [employer] Virus 2 - negative  LDL - 119  HgbA1c - 5.8  VTE prophylaxis - SCDs    No antithrombotic prior to admission, add aspirin 300 supp vs 81 per tube daily (ok in setting of current [facility])  Therapy recommendations:  SNF (too low level for CIR per admission  coordinator)  Disposition:  Pending  Transfer to the floor  Acute Respiratory Failure, improved   Secondary to stroke  Intubated for IR  Extubated 8/16  Mostly upper airway sounds, respiratory effort tiring pt.   Afebrile, WBC normal  Check portable CXR    Attempt deeper suction at family request    Chest PT 4xd  On Robinul  CCM on board  Lethargy  Afebrile, WBC normal  Check portable CXR  pending   Check UA pending   CT scan pending   EEG pending   Atrial Fibrillation  Home anticoagulation:  none, hx SDH   Will consider AC at d/c  Hypertension  Home BP meds: metoprolol ; Norvasc ; Cozaar  Current BP meds: norvasc 10, cozaar 25, metoprolol 50 bid    Stable . BP goal < 180 (mild hemorrhagic transformation, ok for higher GP than primary hemorrhage) . Long-term BP goal normotensive  Hyperlipidemia  Home Lipid lowering medication: Zocor 20 mg daily  LDL 119, goal < 70  Current lipid lowering medication: changed to Lipitor 40 mg   Continue statin at discharge  Dysphagia . Secondary to stroke . NPO  . On cortrak and TF  . Speech on board   Other Stroke Risk Factors  Advanced age  Former cigarette smoker - quit  ETOH abuse, advised to drink no more than 1 alcoholic beverage per day.  Obesity, Body mass index is 29.86 kg/m., recommend weight loss, diet and exercise as appropriate   Coronary artery disease  Other Active Problems  Hx of mild cognitive impairment  ETOH - CIWA  GERD   Mild Leukocytosis - WBCs - 8.7- resolved (afebrile)  Urinary retention, I&O cath x 3 -> foley placed Monday. Await UA, UCx. Trial foley removal maybe tomorrow.  Resume all home eye drops  Resume home kenalog for skin itching  Hospital day # 4 Patient has developed lethargy and depressed mental status for the last few days exact etiology is unclear but does not appear to be infection.  CT scan of the head will be obtained to look for any new  infarcts as well as EEG for seizures.  If no reversible etiology is found may consider starting amantadine 100 mg in the morning.  Long discussion with patient daughter at the bedside and answered questions.  She will likely need a PEG tube if swallowing function does not improve over the next few days.  Greater than 50% time during this 25-minute visit was spent on counseling and coordination of care about her stroke and answering questions  Antony Contras, MD  To contact Stroke Continuity provider, please refer to [email or web address]  After hours, contact General Neurology

## 2019-09-06 NOTE — Progress Notes (Signed)
SLP Cancellation Note  Patient Details Name: [name] MRN: [id number] DOB: [birth year]   Cancelled treatment:       Reason Eval/Treat Not Completed: Patient at procedure or test/unavailable Pt receiving EEG. Will continue efforts.  Jabes Primo P Trever Streater 09/06/2019, 1:45 PM

## 2019-09-06 NOTE — Progress Notes (Signed)
CPT ordered. Patient attempted flutter valve with very poor effort. Patient not able to put the effort in. RT will start chest vest next rounds.

## 2019-09-07 ENCOUNTER — Other Ambulatory Visit: Payer: Self-pay

## 2019-09-07 ENCOUNTER — Encounter (HOSPITAL_COMMUNITY): Payer: Self-pay | Admitting: Physical Medicine & Rehabilitation

## 2019-09-07 ENCOUNTER — Inpatient Hospital Stay (HOSPITAL_COMMUNITY)
Admission: RE | Admit: 2019-09-07 | Discharge: 2019-09-26 | DRG: 057 | Disposition: A | Discharge status: Skilled Nursing Facility | Payer: Medicare Other | Source: Intra-hospital | Attending: Physical Medicine & Rehabilitation | Admitting: Physical Medicine & Rehabilitation

## 2019-09-07 DIAGNOSIS — K219 Gastro-esophageal reflux disease without esophagitis: Secondary | ICD-10-CM | POA: Diagnosis present

## 2019-09-07 DIAGNOSIS — Z90710 Acquired absence of both cervix and uterus: Secondary | ICD-10-CM | POA: Diagnosis not present

## 2019-09-07 DIAGNOSIS — R2689 Other abnormalities of gait and mobility: Secondary | ICD-10-CM | POA: Diagnosis not present

## 2019-09-07 DIAGNOSIS — Z466 Encounter for fitting and adjustment of urinary device: Secondary | ICD-10-CM | POA: Diagnosis not present

## 2019-09-07 DIAGNOSIS — R488 Other symbolic dysfunctions: Secondary | ICD-10-CM | POA: Diagnosis not present

## 2019-09-07 DIAGNOSIS — Z20822 Contact with and (suspected) exposure to covid-19: Secondary | ICD-10-CM | POA: Diagnosis present

## 2019-09-07 DIAGNOSIS — R5381 Other malaise: Secondary | ICD-10-CM | POA: Diagnosis not present

## 2019-09-07 DIAGNOSIS — R1312 Dysphagia, oropharyngeal phase: Secondary | ICD-10-CM | POA: Diagnosis present

## 2019-09-07 DIAGNOSIS — I69391 Dysphagia following cerebral infarction: Secondary | ICD-10-CM | POA: Diagnosis not present

## 2019-09-07 DIAGNOSIS — I63512 Cerebral infarction due to unspecified occlusion or stenosis of left middle cerebral artery: Secondary | ICD-10-CM | POA: Diagnosis not present

## 2019-09-07 DIAGNOSIS — R339 Retention of urine, unspecified: Secondary | ICD-10-CM | POA: Diagnosis present

## 2019-09-07 DIAGNOSIS — I4891 Unspecified atrial fibrillation: Secondary | ICD-10-CM | POA: Diagnosis present

## 2019-09-07 DIAGNOSIS — F329 Major depressive disorder, single episode, unspecified: Secondary | ICD-10-CM | POA: Diagnosis present

## 2019-09-07 DIAGNOSIS — Z431 Encounter for attention to gastrostomy: Secondary | ICD-10-CM | POA: Diagnosis not present

## 2019-09-07 DIAGNOSIS — R400 Somnolence: Secondary | ICD-10-CM | POA: Diagnosis not present

## 2019-09-07 DIAGNOSIS — H409 Unspecified glaucoma: Secondary | ICD-10-CM | POA: Diagnosis not present

## 2019-09-07 DIAGNOSIS — K5900 Constipation, unspecified: Secondary | ICD-10-CM

## 2019-09-07 DIAGNOSIS — G3184 Mild cognitive impairment, so stated: Secondary | ICD-10-CM | POA: Diagnosis present

## 2019-09-07 DIAGNOSIS — Z9889 Other specified postprocedural states: Secondary | ICD-10-CM | POA: Diagnosis not present

## 2019-09-07 DIAGNOSIS — J90 Pleural effusion, not elsewhere classified: Secondary | ICD-10-CM | POA: Diagnosis not present

## 2019-09-07 DIAGNOSIS — M810 Age-related osteoporosis without current pathological fracture: Secondary | ICD-10-CM | POA: Diagnosis present

## 2019-09-07 DIAGNOSIS — R059 Cough, unspecified: Secondary | ICD-10-CM

## 2019-09-07 DIAGNOSIS — E785 Hyperlipidemia, unspecified: Secondary | ICD-10-CM

## 2019-09-07 DIAGNOSIS — K5730 Diverticulosis of large intestine without perforation or abscess without bleeding: Secondary | ICD-10-CM | POA: Diagnosis not present

## 2019-09-07 DIAGNOSIS — R293 Abnormal posture: Secondary | ICD-10-CM | POA: Diagnosis not present

## 2019-09-07 DIAGNOSIS — I10 Essential (primary) hypertension: Secondary | ICD-10-CM | POA: Diagnosis present

## 2019-09-07 DIAGNOSIS — E46 Unspecified protein-calorie malnutrition: Secondary | ICD-10-CM | POA: Diagnosis not present

## 2019-09-07 DIAGNOSIS — R279 Unspecified lack of coordination: Secondary | ICD-10-CM | POA: Diagnosis not present

## 2019-09-07 DIAGNOSIS — Z4682 Encounter for fitting and adjustment of non-vascular catheter: Secondary | ICD-10-CM | POA: Diagnosis not present

## 2019-09-07 DIAGNOSIS — I69328 Other speech and language deficits following cerebral infarction: Secondary | ICD-10-CM | POA: Diagnosis not present

## 2019-09-07 DIAGNOSIS — Z87891 Personal history of nicotine dependence: Secondary | ICD-10-CM

## 2019-09-07 DIAGNOSIS — Z931 Gastrostomy status: Secondary | ICD-10-CM | POA: Diagnosis not present

## 2019-09-07 DIAGNOSIS — G4700 Insomnia, unspecified: Secondary | ICD-10-CM | POA: Diagnosis present

## 2019-09-07 DIAGNOSIS — M6281 Muscle weakness (generalized): Secondary | ICD-10-CM | POA: Diagnosis not present

## 2019-09-07 DIAGNOSIS — Z7982 Long term (current) use of aspirin: Secondary | ICD-10-CM | POA: Diagnosis not present

## 2019-09-07 DIAGNOSIS — I69351 Hemiplegia and hemiparesis following cerebral infarction affecting right dominant side: Principal | ICD-10-CM

## 2019-09-07 DIAGNOSIS — B964 Proteus (mirabilis) (morganii) as the cause of diseases classified elsewhere: Secondary | ICD-10-CM | POA: Diagnosis present

## 2019-09-07 DIAGNOSIS — I2510 Atherosclerotic heart disease of native coronary artery without angina pectoris: Secondary | ICD-10-CM | POA: Diagnosis present

## 2019-09-07 DIAGNOSIS — G459 Transient cerebral ischemic attack, unspecified: Secondary | ICD-10-CM | POA: Diagnosis not present

## 2019-09-07 DIAGNOSIS — R278 Other lack of coordination: Secondary | ICD-10-CM | POA: Diagnosis not present

## 2019-09-07 DIAGNOSIS — E669 Obesity, unspecified: Secondary | ICD-10-CM | POA: Diagnosis present

## 2019-09-07 DIAGNOSIS — R05 Cough: Secondary | ICD-10-CM | POA: Diagnosis not present

## 2019-09-07 DIAGNOSIS — R1310 Dysphagia, unspecified: Secondary | ICD-10-CM | POA: Diagnosis not present

## 2019-09-07 DIAGNOSIS — I69320 Aphasia following cerebral infarction: Secondary | ICD-10-CM | POA: Diagnosis not present

## 2019-09-07 DIAGNOSIS — Z981 Arthrodesis status: Secondary | ICD-10-CM | POA: Diagnosis not present

## 2019-09-07 DIAGNOSIS — J9811 Atelectasis: Secondary | ICD-10-CM | POA: Diagnosis not present

## 2019-09-07 DIAGNOSIS — Z7902 Long term (current) use of antithrombotics/antiplatelets: Secondary | ICD-10-CM | POA: Diagnosis not present

## 2019-09-07 DIAGNOSIS — Z79899 Other long term (current) drug therapy: Secondary | ICD-10-CM

## 2019-09-07 DIAGNOSIS — N390 Urinary tract infection, site not specified: Secondary | ICD-10-CM | POA: Diagnosis present

## 2019-09-07 DIAGNOSIS — I4819 Other persistent atrial fibrillation: Secondary | ICD-10-CM | POA: Diagnosis not present

## 2019-09-07 DIAGNOSIS — Z743 Need for continuous supervision: Secondary | ICD-10-CM | POA: Diagnosis not present

## 2019-09-07 LAB — CBC
HCT: 37.3 % (ref 36.0–46.0)
Hemoglobin: 12.5 g/dL (ref 12.0–15.0)
MCH: 31.6 pg (ref 26.0–34.0)
MCHC: 33.5 g/dL (ref 30.0–36.0)
MCV: 94.4 fL (ref 80.0–100.0)
Platelets: 225 10*3/uL (ref 150–400)
RBC: 3.95 MIL/uL (ref 3.87–5.11)
RDW: 11.8 % (ref 11.5–15.5)
WBC: 8.5 10*3/uL (ref 4.0–10.5)
nRBC: 0 % (ref 0.0–0.2)

## 2019-09-07 LAB — GLUCOSE, CAPILLARY
Glucose-Capillary: 130 mg/dL — ABNORMAL HIGH (ref 70–99)
Glucose-Capillary: 172 mg/dL — ABNORMAL HIGH (ref 70–99)
Glucose-Capillary: 196 mg/dL — ABNORMAL HIGH (ref 70–99)
Glucose-Capillary: 129 mg/dL — ABNORMAL HIGH (ref 70–99)
Glucose-Capillary: 139 mg/dL — ABNORMAL HIGH (ref 70–99)

## 2019-09-07 LAB — BASIC METABOLIC PANEL
Anion gap: 10 (ref 5–15)
BUN: 21 mg/dL (ref 8–23)
CO2: 25 mmol/L (ref 22–32)
Calcium: 9 mg/dL (ref 8.9–10.3)
Chloride: 96 mmol/L — ABNORMAL LOW (ref 98–111)
Creatinine, Ser: 0.66 mg/dL (ref 0.44–1.00)
GFR calc Af Amer: >60 mL/min (ref >60)
GFR calc non Af Amer: >60 mL/min (ref >60)
Glucose, Bld: 142 mg/dL — ABNORMAL HIGH (ref 70–99)
Potassium: 4 mmol/L (ref 3.5–5.1)
Sodium: 131 mmol/L — ABNORMAL LOW (ref 135–145)

## 2019-09-07 MED ORDER — ATORVASTATIN CALCIUM 40 MG PO TABS: 40.0000 mg | ORAL_TABLET | Freq: Every day | ORAL | Status: AC

## 2019-09-07 MED ORDER — SENNOSIDES-DOCUSATE SODIUM 8.6-50 MG PO TABS: 1.0000 | ORAL_TABLET | Freq: Every evening | ORAL | Status: DC | PRN

## 2019-09-07 MED ORDER — PROSOURCE TF PO LIQD
45.0000 mL | Freq: Two times a day (BID) | ORAL | Status: DC
  Administered 2019-09-07 – 2019-09-08 (×2): 45 mL
  Filled 2019-09-07 (×3): qty 45

## 2019-09-07 MED ORDER — LATANOPROST 0.005 % OP SOLN: 1.0000 [drp] | Freq: Every day | OPHTHALMIC | 12 refills | Status: AC

## 2019-09-07 MED ORDER — AMANTADINE HCL 100 MG PO CAPS
100.0000 mg | ORAL_CAPSULE | Freq: Every day | ORAL | Status: DC
  Filled 2019-09-07 (×3): qty 1

## 2019-09-07 MED ORDER — LATANOPROST 0.005 % OP SOLN
1.0000 [drp] | Freq: Every day | OPHTHALMIC | Status: DC
  Administered 2019-09-07 – 2019-09-25 (×18): 1 [drp] via OPHTHALMIC
  Filled 2019-09-07 (×2): qty 2.5

## 2019-09-07 MED ORDER — LOSARTAN POTASSIUM 50 MG PO TABS
25.0000 mg | ORAL_TABLET | Freq: Every day | ORAL | Status: DC
  Administered 2019-09-08 – 2019-09-26 (×19): 25 mg
  Filled 2019-09-07 (×19): qty 1

## 2019-09-07 MED ORDER — ATORVASTATIN CALCIUM 40 MG PO TABS
40.0000 mg | ORAL_TABLET | Freq: Every day | ORAL | Status: DC
  Administered 2019-09-08 – 2019-09-26 (×18): 40 mg
  Filled 2019-09-07 (×18): qty 1

## 2019-09-07 MED ORDER — OSMOLITE 1.2 CAL PO LIQD
1000.0000 mL | ORAL | Status: DC
  Administered 2019-09-07: 1000 mL
  Filled 2019-09-07: qty 1000

## 2019-09-07 MED ORDER — ADULT MULTIVITAMIN LIQUID CH: 15.0000 mL | Freq: Every day | ORAL | Status: AC

## 2019-09-07 MED ORDER — OSMOLITE 1.2 CAL PO LIQD: 1000.0000 mL | ORAL | 0 refills | Status: DC

## 2019-09-07 MED ORDER — ASPIRIN 81 MG PO CHEW: 81.0000 mg | CHEWABLE_TABLET | Freq: Every day | ORAL | Status: AC

## 2019-09-07 MED ORDER — PANTOPRAZOLE SODIUM 40 MG PO PACK
40.0000 mg | PACK | Freq: Every day | ORAL | Status: DC
  Administered 2019-09-07 – 2019-09-25 (×19): 40 mg
  Filled 2019-09-07 (×9): qty 20

## 2019-09-07 MED ORDER — SODIUM CHLORIDE 0.9 % IV SOLN: 50.0000 mL | INTRAVENOUS | 0 refills | Status: DC

## 2019-09-07 MED ORDER — ACETAMINOPHEN 160 MG/5ML PO SOLN: 650.0000 mg | ORAL | 0 refills | Status: AC | PRN

## 2019-09-07 MED ORDER — AMLODIPINE BESYLATE 10 MG PO TABS: 10.0000 mg | ORAL_TABLET | Freq: Every day | ORAL | Status: AC

## 2019-09-07 MED ORDER — LOSARTAN POTASSIUM 25 MG PO TABS: 25.0000 mg | ORAL_TABLET | Freq: Every day | ORAL | Status: AC

## 2019-09-07 MED ORDER — AMANTADINE HCL 50 MG/5ML PO SYRP
100.0000 mg | ORAL_SOLUTION | Freq: Every day | ORAL | Status: DC
  Administered 2019-09-08 – 2019-09-09 (×2): 100 mg
  Filled 2019-09-07 (×2): qty 10

## 2019-09-07 MED ORDER — ASPIRIN 81 MG PO CHEW
81.0000 mg | CHEWABLE_TABLET | Freq: Every day | ORAL | Status: DC
  Administered 2019-09-08 – 2019-09-26 (×18): 81 mg
  Filled 2019-09-07 (×21): qty 1

## 2019-09-07 MED ORDER — CYANOCOBALAMIN 1000 MCG PO TABS: 3000.0000 ug | ORAL_TABLET | Freq: Every day | ORAL | Status: AC

## 2019-09-07 MED ORDER — GLYCOPYRROLATE 0.2 MG/ML IJ SOLN
0.1000 mg | INTRAMUSCULAR | Status: DC | PRN
  Administered 2019-09-08 – 2019-09-10 (×5): 0.1 mg via INTRAVENOUS
  Filled 2019-09-07 (×7): qty 0.5

## 2019-09-07 MED ORDER — ASPIRIN 300 MG RE SUPP
300.0000 mg | Freq: Every day | RECTAL | Status: DC
  Filled 2019-09-07 (×11): qty 1

## 2019-09-07 MED ORDER — GLYCOPYRROLATE 0.2 MG/ML IJ SOLN: 0.1000 mg | INTRAMUSCULAR | Status: DC | PRN

## 2019-09-07 MED ORDER — ADULT MULTIVITAMIN LIQUID CH
15.0000 mL | Freq: Every day | ORAL | Status: DC
  Administered 2019-09-08 – 2019-09-25 (×17): 15 mL
  Filled 2019-09-07 (×18): qty 15

## 2019-09-07 MED ORDER — ACETAMINOPHEN 325 MG PO TABS: 650.0000 mg | ORAL_TABLET | ORAL | Status: DC | PRN

## 2019-09-07 MED ORDER — PROSOURCE TF PO LIQD: 45.0000 mL | Freq: Two times a day (BID) | ORAL | Status: DC

## 2019-09-07 MED ORDER — AMLODIPINE BESYLATE 10 MG PO TABS
10.0000 mg | ORAL_TABLET | Freq: Every day | ORAL | Status: DC
  Administered 2019-09-08 – 2019-09-26 (×19): 10 mg
  Filled 2019-09-07 (×19): qty 1

## 2019-09-07 MED ORDER — ACETAMINOPHEN 650 MG RE SUPP: 650.0000 mg | RECTAL | Status: DC | PRN

## 2019-09-07 MED ORDER — SERTRALINE HCL 50 MG PO TABS
50.0000 mg | ORAL_TABLET | Freq: Every day | ORAL | Status: DC
  Administered 2019-09-08 – 2019-09-26 (×18): 50 mg
  Filled 2019-09-07 (×18): qty 1

## 2019-09-07 MED ORDER — DONEPEZIL HCL 10 MG PO TABS: 10.0000 mg | ORAL_TABLET | Freq: Every day | ORAL | Status: AC

## 2019-09-07 MED ORDER — METOPROLOL TARTRATE 25 MG/10 ML ORAL SUSPENSION
50.0000 mg | Freq: Two times a day (BID) | ORAL | Status: DC
  Administered 2019-09-07 – 2019-09-26 (×37): 50 mg
  Filled 2019-09-07 (×38): qty 20

## 2019-09-07 MED ORDER — TIMOLOL MALEATE 0.5 % OP SOLN
1.0000 [drp] | Freq: Every day | OPHTHALMIC | Status: DC
  Administered 2019-09-08 – 2019-09-26 (×18): 1 [drp] via OPHTHALMIC
  Filled 2019-09-07: qty 5

## 2019-09-07 MED ORDER — METOPROLOL TARTRATE 25 MG/10 ML ORAL SUSPENSION: 50.0000 mg | Freq: Two times a day (BID) | ORAL | Status: AC

## 2019-09-07 MED ORDER — CHLORHEXIDINE GLUCONATE 0.12% ORAL RINSE (MEDLINE KIT): 15.0000 mL | Freq: Two times a day (BID) | OROMUCOSAL | 0 refills | Status: DC

## 2019-09-07 MED ORDER — PANTOPRAZOLE SODIUM 40 MG PO PACK: 40.0000 mg | PACK | Freq: Every day | ORAL | Status: AC

## 2019-09-07 MED ORDER — DONEPEZIL HCL 10 MG PO TABS
10.0000 mg | ORAL_TABLET | Freq: Every day | ORAL | Status: DC
  Administered 2019-09-07 – 2019-09-25 (×19): 10 mg
  Filled 2019-09-07 (×19): qty 1

## 2019-09-07 MED ORDER — VITAMIN B-12 1000 MCG PO TABS
3000.0000 ug | ORAL_TABLET | Freq: Every day | ORAL | Status: DC
  Administered 2019-09-08 – 2019-09-26 (×18): 3000 ug
  Filled 2019-09-07 (×18): qty 3

## 2019-09-07 MED ORDER — AMANTADINE HCL 50 MG/5ML PO SYRP: 100.0000 mg | ORAL_SOLUTION | Freq: Every day | ORAL | 0 refills | Status: DC

## 2019-09-07 MED ORDER — SERTRALINE HCL 50 MG PO TABS: 50.0000 mg | ORAL_TABLET | Freq: Every day | ORAL | Status: AC

## 2019-09-07 MED ORDER — ACETAMINOPHEN 160 MG/5ML PO SOLN
650.0000 mg | ORAL | Status: DC | PRN
  Filled 2019-09-07: qty 20.3

## 2019-09-07 NOTE — H&P (Signed)
Physical Medicine and Rehabilitation Admission H&P        Chief Complaint  Patient presents with  . Code Stroke  : HPI: [name] is an 86 year old right-handed female with history of atrial fibrillation not on anticoagulation secondary to prior subdural hematoma, hypertension, hyperlipidemia, CAD, mild cognitive impairment of vascular etiology complicated by alcohol use maintained on Aricept.  History taken from chart review.  Patient resides independent living facility at [facility] in [place].  Presented 09/02/2019 with right side weakness and aphasia.  EMS noted blood pressure of 180s/90s.  Admission chemistry sodium 134, glucose 136, hemoglobin A1c 5.8.  Cranial CT scan showed no acute intracranial hemorrhage.  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  Near occlusive thrombus within the proximal left M1 MCA extending into M2 branch.  Patient did not receive TPA.  Echocardiogram with ejection fraction of 60 to 65% no wall motion abnormalities.  Patient did undergo complete recanalization per interventional radiology and was intubated for a short time.    Follow-up head CT 09/06/2019 after revascularization procedure showed petechial hemorrhage at the basal ganglia and small subarachnoid hemorrhage along the sylvian fissure and again scan completed 09/06/2019 showing no evidence of new hemorrhage.  Hemorrhagic infarct left putamen stable.  Small amount hemorrhage sylvian fissure and left superior temporal lobe anteriorly stable.  Currently maintained on aspirin for CVA prophylaxis.  Currently n.p.o. with alternative means of nutritional support.  EEG suggestive of cortical dysfunction left hemisphere likely secondary to underlying infarct as well as mild to moderate encephalopathy no seizure activity.  Hospital course further complicated by bouts of urinary retention post void residual 900 mL.  Therapy evaluation completed and patient was admitted for a  comprehensive rehab program.   Daughter told me pt took 30 mg of melatonin and multiple tabs of ZZZquil to sleep every night- after they had her taken off Ambien.   Had foley placed 1-2 days ago per daughter due to urinary retention.  Also told doing vest therapy for chest PT- however mainly has upper airway sounds, so doesn't need q4 hours per resp therapy.    LBM yesterday- was incontinent.      Review of Systems  Unable to perform ROS: Acuity of condition        Past Medical History:  Diagnosis Date  . Arthritis    . Atrial fibrillation ([place])    . Cataract      BILATERAL  . DDD (degenerative disc disease), cervical    . Depression    . Diverticulosis    . Falls    . GERD (gastroesophageal reflux disease)    . Glaucoma    . Headache    . Hyperlipidemia    . Hyperlipidemia    . Hypertension    . Osteoporosis    . Peripheral neuropathy    . Spondylosis      lumbar  . Subdural hematoma [facility])           Past Surgical History:  Procedure Laterality Date  . APPENDECTOMY      . BACK SURGERY   2010  . BURR HOLE FOR SUBDURAL HEMATOMA        x 2 hematoma  . COLONOSCOPY      . IR CT HEAD LTD   09/04/2019  . IR PERCUTANEOUS ART THROMBECTOMY/INFUSION INTRACRANIAL INC DIAG ANGIO   09/02/2019       . IR PERCUTANEOUS ART THROMBECTOMY/INFUSION INTRACRANIAL INC DIAG ANGIO  09/04/2019  . KNEE SURGERY Bilateral    . RADIOLOGY WITH ANESTHESIA N/A 09/02/2019    Procedure: IR WITH ANESTHESIA;  Surgeon: Radiologist, Medication, MD;  Location: [facility];  Service: Radiology;  Laterality: N/A;  . VAGINAL HYSTERECTOMY             Family History  Problem Relation Age of Onset  . Heart disease Father    . Ovarian cancer Mother          ?  Marland Kitchen Breast cancer Sister    . Colon cancer Neg Hx    . Diabetes Neg Hx    . Liver disease Neg Hx    . Kidney disease Neg Hx    . Dementia Neg Hx      Social History:  reports that she has quit smoking. She has never used smokeless tobacco. She reports  current alcohol use of about 14.0 standard drinks of alcohol per week. She reports that she does not use drugs. Allergies:       Allergies  Allergen Reactions  . Codeine Other (See Comments)      unknown          Medications Prior to Admission  Medication Sig Dispense Refill  . acetaminophen (TYLENOL) 650 MG CR tablet Take 650 mg by mouth 2 (two) times daily.       Marland Kitchen amLODipine (NORVASC) 10 MG tablet Take 10 mg by mouth daily.       . APPLE CIDER VINEGAR PO Take 1,200 mg by mouth daily.      . Ascorbic Acid (VITAMIN C) 1000 MG tablet Take 1,000 mg by mouth daily.      . Biotin 5000 MCG TABS Take 5,000 mcg by mouth daily.      . Cholecalciferol (VITAMIN D3 SUPER STRENGTH) 50 MCG (2000 UT) TABS Take 2,000 Units by mouth daily.      . clobetasol (TEMOVATE) 0.05 % external solution Apply 1 application topically daily as needed (scalp itching).      . diphenhydrAMINE HCl, Sleep, (ZZZQUIL PO) Take 3 capsules by mouth at bedtime.      . donepezil (ARICEPT) 10 MG tablet Take 10 mg by mouth at bedtime.      Marland Kitchen latanoprost (XALATAN) 0.005 % ophthalmic solution Place 1 drop into both eyes at bedtime.      Marland Kitchen loratadine (CLARITIN) 10 MG tablet Take 10 mg by mouth daily as needed for itching.       . losartan (COZAAR) 25 MG tablet Take 25 mg by mouth daily.      . Melatonin 10 MG TABS Take 30 mg by mouth at bedtime.       . meloxicam (MOBIC) 15 MG tablet Take 15 mg by mouth daily as needed for pain.       . metoprolol tartrate (LOPRESSOR) 50 MG tablet Take 50 mg by mouth 2 (two) times daily.       Marland Kitchen omeprazole (PRILOSEC) 40 MG capsule Take 40 mg by mouth daily.       Marland Kitchen OVER THE COUNTER MEDICATION Take 2 tablets by mouth daily. "Sugar Bear Hair"      . Psyllium (METAMUCIL PO) Take 1 Dose by mouth daily as needed (constipation).      . Sennosides (EX-LAX PO) Take 1 tablet by mouth daily as needed (constipation).      . sertraline (ZOLOFT) 50 MG tablet Take 50 mg by mouth daily.       . Simethicone  (GAS-X PO) Take 1  tablet by mouth daily as needed (gas/bloating/flatulence).      . simvastatin (ZOCOR) 20 MG tablet Take 20 mg by mouth at bedtime.       . timolol (TIMOPTIC) 0.5 % ophthalmic solution Place 1 drop into both eyes daily.      Marland Kitchen triamcinolone lotion (KENALOG) 0.1 % Apply 1 application topically daily as needed (itching).      . TURMERIC PO Take 200 mg by mouth daily.      . vitamin B-12 (CYANOCOBALAMIN) 1000 MCG tablet Take 3,000 mcg by mouth daily.          Drug Regimen Review Drug regimen was reviewed and remains appropriate with no significant issues identified   Home: Home Living Family/patient expects to be discharged to:: Private residence Living Arrangements: Alone Available Help at Discharge: Available PRN/intermittently Type of Home: Independent living facility Home Access: Level entry Home Layout: One level Bathroom Shower/Tub: Multimedia programmer: Handicapped height Bathroom Accessibility: Yes Home Equipment: Shower seat, Grab bars - toilet, Grab bars - tub/shower, Cane - single point, Environmental consultant - 2 wheels   Functional History: Prior Function Level of Independence: Independent with assistive device(s), Needs assistance ADL's / Homemaking Assistance Needed: has meals delivered, but can do breakfast and coffee, also has cleaning help every other week Comments: independent living cottage; cane; had assistance with cleaning; drives; has had "alot of falls" per daughter - none in past 6 months; does have life alert but she doesn't use it"very non-compliant" per daughter   Functional Status:  Mobility: Bed Mobility Overal bed mobility: Needs Assistance Bed Mobility: Rolling, Sidelying to Sit, Sit to Supine Rolling: Mod assist Sidelying to sit: Mod assist, HOB elevated Sit to supine: Max assist Sit to sidelying: +2 for physical assistance, Max assist General bed mobility comments: Pt was able, with multimodal cues to roll to her right using R bed  rail, assist in progressing her left leg over to the side of the bed and scoot, Mod assist overall to help progress trunk up to sitting and scoot the rest of the way to EOB.  Max assist to return to supine due to assist at both trunk and legs.   Transfers Overall transfer level: Needs assistance Equipment used: 2 person hand held assist Transfers: Sit to/from Stand Sit to Stand: +2 physical assistance, Max assist General transfer comment: would try the stedy next session with second person assist.  Ambulation/Gait General Gait Details: pt unable   ADL: ADL Overall ADL's : Needs assistance/impaired Eating/Feeding: NPO Grooming: Moderate assistance Grooming Details (indicate cue type and reason): able to "brush teeth" with toothette after set up using L hand Upper Body Bathing: Moderate assistance, Sitting Lower Body Bathing: Maximal assistance, Sit to/from stand, Sitting/lateral leans Upper Body Dressing : Maximal assistance, Sitting Lower Body Dressing: Total assistance Toileting- Clothing Manipulation and Hygiene: Total assistance Functional mobility during ADLs: Maximal assistance, +2 for physical assistance   Cognition: Cognition Overall Cognitive Status: Impaired/Different from baseline Arousal/Alertness: Awake/alert Orientation Level: Oriented to person Attention: Sustained Sustained Attention: Appears intact (simple, functional tasks) Awareness: Impaired Awareness Impairment: Emergent impairment Cognition Arousal/Alertness: Awake/alert Behavior During Therapy: WFL for tasks assessed/performed Overall Cognitive Status: Impaired/Different from baseline Area of Impairment: Attention, Memory, Following commands, Safety/judgement, Awareness, Problem solving Orientation Level: Disoriented to, Place, Situation Current Attention Level: Sustained Memory: Decreased short-term memory Following Commands: Follows one step commands with increased time Safety/Judgement: Decreased  awareness of deficits Awareness: Intellectual Problem Solving: Slow processing, Decreased initiation General Comments: Pt often saying "yes"  without consistent accuracy.  multimodal cues for understandinhg, when testing sensation, pt said "yes" throughout even when not touching, R inattention.  Difficult to assess due to: Impaired communication   Physical Exam: Blood pressure (!) 154/75, pulse 70, temperature 98.3 F (36.8 C), temperature source Oral, resp. rate (!) 21, height 5\' 5"  (1.651 m), weight 82.2 kg, SpO2 96 %. Physical Exam Vitals and nursing note reviewed.  Constitutional:      Comments: Pt laying supine in bed- daughter at bedside- snoring loudly most of visit - mouth breather, NAD  HENT:     Head: Normocephalic and atraumatic.     Comments: Nasogastric tube in place- Cortrak in place- mouth breathing Couldn't smile for me or stick out tongue, since so sleepy, however has deep R facial droop at rest    Right Ear: External ear normal.     Left Ear: External ear normal.     Nose: Congestion and rhinorrhea present.     Mouth/Throat:     Mouth: Mucous membranes are dry.     Pharynx: Oropharynx is clear. No oropharyngeal exudate or posterior oropharyngeal erythema.  Eyes:     Comments: Tried to test EOMs- pt appears she's unable to look past midline to Right- but was asleep for most of interview- no nystagmus seen- was disjointed  Cardiovascular:     Comments: Irregular rhythm- rate controlled- ~ 70s per exam and monitor Pulmonary:     Comments: Lots of upper airway sounds since snoring/mouth breathing- good air movement B/L Abdominal:     Comments: Soft, NT, ND, (+)BS hypoactive-TFs via Cortrak  Genitourinary:    Comments: FOLEY IN PLACE- light amber urine Musculoskeletal:     Cervical back: Normal range of motion. No rigidity.     Comments: Pt asleep most of time- wouldn't wake for more than a few seconds- trace grip on R hand; otherwise 0/5 in RUE and 0/5 in RLE- tested  biceps, triceps, WE, grip and finger bd; as well as HF, KE, KF, DF and PF LUE- at least 4+/5 in LUE- in grip, biceps and triceps- kept falling asleep   Skin:    Comments: IV L forearm- looks great  Neurological:     Comments: Patient is lethargic but arousable.  She will open her eyes on command.  Nonverbal.  She does provide some yes no head nods to simple questions.  She did squeeze my hand on verbal command but followed no other verbal commands. Followed no verbal commands or questions yes/no asked- nodded head once, but then no answer to was she in pain- very lethargic the entire time- got amantadine 20 minutes before examined. Cannot assess sensation - per pt confusion/lethargy  Psychiatric:     Comments: sedated        Lab Results Last 48 Hours        Results for orders placed or performed during the hospital encounter of 09/02/19 (from the past 48 hour(s))  Glucose, capillary     Status: Abnormal    Collection Time: 09/05/19  7:41 AM  Result Value Ref Range    Glucose-Capillary 151 (H) 70 - 99 mg/dL      Comment: Glucose reference range applies only to samples taken after fasting for at least 8 hours.  Glucose, capillary     Status: Abnormal    Collection Time: 09/05/19 12:04 PM  Result Value Ref Range    Glucose-Capillary 151 (H) 70 - 99 mg/dL      Comment: Glucose reference range applies  only to samples taken after fasting for at least 8 hours.  Glucose, capillary     Status: Abnormal    Collection Time: 09/05/19  8:57 PM  Result Value Ref Range    Glucose-Capillary 128 (H) 70 - 99 mg/dL      Comment: Glucose reference range applies only to samples taken after fasting for at least 8 hours.  Glucose, capillary     Status: Abnormal    Collection Time: 09/06/19 12:12 AM  Result Value Ref Range    Glucose-Capillary 133 (H) 70 - 99 mg/dL      Comment: Glucose reference range applies only to samples taken after fasting for at least 8 hours.    Comment 1 Notify RN      Comment  2 Document in Chart    CBC     Status: None    Collection Time: 09/06/19  1:57 AM  Result Value Ref Range    WBC 8.7 4.0 - 10.5 K/uL    RBC 3.93 3.87 - 5.11 MIL/uL    Hemoglobin 12.7 12.0 - 15.0 g/dL    HCT 37.2 36 - 46 %    MCV 94.7 80.0 - 100.0 fL    MCH 32.3 26.0 - 34.0 pg    MCHC 34.1 30.0 - 36.0 g/dL    RDW 11.9 11.5 - 15.5 %    Platelets 210 150 - 400 K/uL    nRBC 0.0 0.0 - 0.2 %      Comment: Performed at [facility], [place] [address] [place], [place] [address]  Basic metabolic panel     Status: Abnormal    Collection Time: 09/06/19  1:57 AM  Result Value Ref Range    Sodium 131 (L) 135 - 145 mmol/L    Potassium 3.7 3.5 - 5.1 mmol/L    Chloride 96 (L) 98 - 111 mmol/L    CO2 26 22 - 32 mmol/L    Glucose, Bld 141 (H) 70 - 99 mg/dL      Comment: Glucose reference range applies only to samples taken after fasting for at least 8 hours.    BUN 19 8 - 23 mg/dL    Creatinine, Ser 0.64 0.44 - 1.00 mg/dL    Calcium 9.1 8.9 - 10.3 mg/dL    GFR calc non Af Amer >60 >60 mL/min    GFR calc Af Amer >60 >60 mL/min    Anion gap 9 5 - 15      Comment: Performed at [facility] [address], [place], [place] [address]  Glucose, capillary     Status: Abnormal    Collection Time: 09/06/19  4:19 AM  Result Value Ref Range    Glucose-Capillary 157 (H) 70 - 99 mg/dL      Comment: Glucose reference range applies only to samples taken after fasting for at least 8 hours.    Comment 1 Notify RN      Comment 2 Document in Chart    Glucose, capillary     Status: Abnormal    Collection Time: 09/06/19  7:54 AM  Result Value Ref Range    Glucose-Capillary 158 (H) 70 - 99 mg/dL      Comment: Glucose reference range applies only to samples taken after fasting for at least 8 hours.  Glucose, capillary     Status: Abnormal    Collection Time: 09/06/19 11:55 AM  Result Value Ref Range    Glucose-Capillary 167 (H) 70 - 99 mg/dL  Comment: Glucose reference range applies only to  samples taken after fasting for at least 8 hours.  Urinalysis, Routine w reflex microscopic Urine, Catheterized     Status: Abnormal    Collection Time: 09/06/19 12:00 PM  Result Value Ref Range    Color, Urine YELLOW YELLOW    APPearance HAZY (A) CLEAR    Specific Gravity, Urine 1.010 1.005 - 1.030    pH 8.0 5.0 - 8.0    Glucose, UA NEGATIVE NEGATIVE mg/dL    Hgb urine dipstick SMALL (A) NEGATIVE    Bilirubin Urine NEGATIVE NEGATIVE    Ketones, ur NEGATIVE NEGATIVE mg/dL    Protein, ur NEGATIVE NEGATIVE mg/dL    Nitrite NEGATIVE NEGATIVE    Leukocytes,Ua LARGE (A) NEGATIVE    RBC / HPF 0-5 0 - 5 RBC/hpf    WBC, UA 21-50 0 - 5 WBC/hpf    Bacteria, UA RARE (A) NONE SEEN    Triple Phosphate Crystal PRESENT        Comment: Performed at [facility], [address], [place], [place] [address]  Glucose, capillary     Status: Abnormal    Collection Time: 09/06/19  4:35 PM  Result Value Ref Range    Glucose-Capillary 120 (H) 70 - 99 mg/dL      Comment: Glucose reference range applies only to samples taken after fasting for at least 8 hours.  Glucose, capillary     Status: Abnormal    Collection Time: 09/06/19  8:08 PM  Result Value Ref Range    Glucose-Capillary 137 (H) 70 - 99 mg/dL      Comment: Glucose reference range applies only to samples taken after fasting for at least 8 hours.    Comment 1 Notify RN      Comment 2 Document in Chart    Glucose, capillary     Status: Abnormal    Collection Time: 09/06/19 11:32 PM  Result Value Ref Range    Glucose-Capillary 130 (H) 70 - 99 mg/dL      Comment: Glucose reference range applies only to samples taken after fasting for at least 8 hours.    Comment 1 Notify RN      Comment 2 Document in Chart    CBC     Status: None    Collection Time: 09/07/19  2:15 AM  Result Value Ref Range    WBC 8.5 4.0 - 10.5 K/uL    RBC 3.95 3.87 - 5.11 MIL/uL    Hemoglobin 12.5 12.0 - 15.0 g/dL    HCT 37.3 36 - 46 %    MCV 94.4 80.0 - 100.0 fL     MCH 31.6 26.0 - 34.0 pg    MCHC 33.5 30.0 - 36.0 g/dL    RDW 11.8 11.5 - 15.5 %    Platelets 225 150 - 400 K/uL    nRBC 0.0 0.0 - 0.2 %      Comment: Performed at [facility], [place] [address], [place], [place] [address]  Basic metabolic panel     Status: Abnormal    Collection Time: 09/07/19  2:15 AM  Result Value Ref Range    Sodium 131 (L) 135 - 145 mmol/L    Potassium 4.0 3.5 - 5.1 mmol/L    Chloride 96 (L) 98 - 111 mmol/L    CO2 25 22 - 32 mmol/L    Glucose, Bld 142 (H) 70 - 99 mg/dL      Comment: Glucose reference range  applies only to samples taken after fasting for at least 8 hours.    BUN 21 8 - 23 mg/dL    Creatinine, Ser 0.66 0.44 - 1.00 mg/dL    Calcium 9.0 8.9 - 10.3 mg/dL    GFR calc non Af Amer >60 >60 mL/min    GFR calc Af Amer >60 >60 mL/min    Anion gap 10 5 - 15      Comment: Performed at [facility] [address] [place], [place] [address]  Glucose, capillary     Status: Abnormal    Collection Time: 09/07/19  3:23 AM  Result Value Ref Range    Glucose-Capillary 130 (H) 70 - 99 mg/dL      Comment: Glucose reference range applies only to samples taken after fasting for at least 8 hours.    Comment 1 Notify RN      Comment 2 Document in Chart         Imaging Results (Last 48 hours)  CT HEAD WO CONTRAST   Result Date: 09/06/2019 CLINICAL DATA:  Stroke post left MCA thrombectomy and recanalization 09/04/2019. Follow-up intracranial hemorrhage. EXAM: CT HEAD WITHOUT CONTRAST TECHNIQUE: Contiguous axial images were obtained from the base of the skull through the vertex without intravenous contrast. COMPARISON:  CT head 09/05/2019 FINDINGS: Brain: Small amount of hemorrhage in the left putamen with associated surrounding low-density infarct. Low density in the left putamen compatible with acute infarct also unchanged. Small amount of hemorrhage in the left sylvian fissure similar to the prior study. Hemorrhage in the left superior anterior temporal lobe  unchanged. No new areas of hemorrhage Moderate atrophy. Chronic microvascular ischemic changes in the white matter. Negative for hydrocephalus Vascular: Negative for hyperdense vessel Skull: 2 burr holes in the right parietal bone. No acute skeletal abnormality Sinuses/Orbits: Chronic depressed fracture left orbital floor unchanged. Bilateral proptosis without orbital mass. Mild mucosal edema paranasal sinuses. Air-fluid level sphenoid sinus. Other: None IMPRESSION: Stable CT without evidence of new hemorrhage Hemorrhagic infarction left putamen stable. Small amount of hemorrhage in the left sylvian fissure and left superior temporal lobe anteriorly stable. Electronically Signed   By: Franchot Gallo M.D.   On: 09/06/2019 13:59    DG CHEST PORT 1 VIEW   Result Date: 09/06/2019 CLINICAL DATA:  Respiratory failure.  Lethargy. EXAM: PORTABLE CHEST 1 VIEW COMPARISON:  01/14/2008 FINDINGS: The heart is within normal limits in size given the AP projection and portable technique. Moderate tortuosity and calcification of the thoracic aorta. Pulmonary hila are slightly prominent but probably normal for age and degree of inspiration. Low lung volumes with vascular crowding and streaky atelectasis. No infiltrates, edema, effusions or pneumothorax. The bony thorax is intact. IMPRESSION: 1. Low lung volumes with vascular crowding and streaky atelectasis. 2. No infiltrates, edema or effusions. Electronically Signed   By: Marijo Sanes M.D.   On: 09/06/2019 13:25    EEG adult   Result Date: 09/06/2019 Lora Havens, MD     09/06/2019  3:46 PM Patient Name: Jurnei G Polivka MRN: [id number] Epilepsy Attending: Lora Havens Referring Physician/Provider: Burnetta Sabin, NP Date: 09/06/2019 Duration: 21.30 mins Patient history: 86 year old female who presented with speech disturbance, left-sided gaze deviation and nonrhythmic shaking of left side.  CT showed left MCA acute infarct.  EEG to assess for seizures. Level of  alertness: Awake AEDs during EEG study: None Technical aspects: This EEG study was done with scalp electrodes positioned according to the 10-20 International system of electrode  placement. Electrical activity was acquired at a sampling rate of 500Hz  and reviewed with a high frequency filter of 70Hz  and a low frequency filter of 1Hz . EEG data were recorded continuously and digitally stored. Description: The posterior dominant rhythm consists of 8 Hz activity of moderate voltage (25-35 uV) seen predominantly in posterior head regions, symmetric and reactive to eye opening and eye closing. EEG showed continuous generalized and lateralized left hemisphere 3 to 6 Hz theta-delta slowing with overriding 13-15hz  generalized beta activity. Hyperventilation and photic stimulation were not performed.   ABNORMALITY -Continuous slow, generalized and lateralized left hemisphere IMPRESSION: This study is suggestive of cortical dysfunction in left hemisphere likely secondary to underlying infarct as well as mild to moderate diffuse encephalopathy, nonspecific etiology. No seizures or definite epileptiform discharges were seen throughout the recording. Lora Havens             Medical Problem List and Plan: 1.  Right side weakness and aphasia secondary to left MCA territory infarction status post IR revascularization with SAH             -patient may  Shower if has good enough sitting balance             -ELOS/Goals: 3-4 weeks- goals min A hopefully 2.  Antithrombotics: -DVT/anticoagulation: SCDs- had petechial hemrrhages             -antiplatelet therapy: Aspirin 81 mg daily 3. Pain Management: Tylenol as needed 4. Mood: Amantadine 100 mg daily, Zoloft 50 mg daily, Aricept 10 mg nightly- just changed amantadine to liquid- first dose 8/19 in PM- might benefit from Ritalin to wake her up             -antipsychotic agents: N/A 5. Neuropsych: This patient is NOT capable of making decisions on her own behalf. 6.  Skin/Wound Care: Routine skin checks 7. Fluids/Electrolytes/Nutrition: Routine in and outs with follow-up chemistries 8.  Dysphagia.  Tube feeds as directed.  Dietary follow-up.  Speech therapy follow-up- has Cortrak- assess if things improves, or if needs PEG long term 9.  Hypertension.  Lopressor 50 mg twice daily, Cozaar 25 mg daily, Norvasc 10 mg daily.  Monitor with increased mobility 10.  Hyperlipidemia.  Lipitor 11.  History of alcohol use.  Provide counseling with patient and family 23.  Atrial fibrillation.  No anticoagulation at this time.  Cardiac rate controlled.  Continue beta-blocker 13. Urinary retention- has foley since significant retention- cannot take flomax with Cortrak.  14. Sleep-/insomnia- pt took 30, not 3 mg of Melatonin nightly at home AND 3+ pills of ZZZquil at home to sleep nightly per daughter- Tasia Catchings 09/07/2019      I have personally performed a face to face diagnostic evaluation of this patient and formulated the key components of the plan.  Additionally, I have personally reviewed laboratory data, imaging studies, as well as relevant notes and concur with the physician assistant's documentation above.   The patient's status has not changed from the original H&P.  Any changes in documentation from the acute care chart have been noted above.

## 2019-09-07 NOTE — Progress Notes (Signed)
Pt tolerated vest therapy well. Pt with rhonchi heard in the upper right lobe otherwise clear to auscultation throughout. Pt with audible upper airway congestion. Pt with weak cough, oral suction performed with minimal amount suctioned from back of pt mouth. Pt SpO2 95% on room air. RT will continue to monitor.

## 2019-09-07 NOTE — Progress Notes (Signed)
Jamse Arn, MD  Physician  Physical Medicine and Rehabilitation  Consult Note    Signed  Date of Service:  09/06/2019 7:22 AM      Related encounter: ED to Hosp-Admission (Current) from 09/02/2019 in [facility]      Signed      Expand All Collapse All  Show:Clear all [x] Manual[x] Template[x] Copied  Added by: [x] Angiulli, Lavon Paganini, PA-C[x] Jamse Arn, MD  [] Hover for details      Physical Medicine and Rehabilitation Consult Reason for Consult: Right side weakness, altered mental status with aphasia Referring Physician: Dr. Leonie Man   HPI: [name] is a 86 y.o. right-handed female with history of atrial fibrillation not on anticoagulation secondary to prior subdural hematoma, hypertension, hyperlipidemia, CAD, mild cognitive impairment of vascular etiology complicated by alcohol use.  History taken from chart review due to cognition.  Patient resides in an independent living at [facility] in [place].  She presented on 09/02/2019 with right hemiparesis and aphasia.  EMS noted blood pressure of 180s/90s.  Cranial CT scan showed no acute intracranial hemorrhage.  Left MCA territory acute infarct involving the basal ganglia.  Near occlusive thrombus within the proximal left M1 MCA extending into M2 branch.  Patient did not receive TPA.  Patient did undergo complete recanalization per interventional radiology.  Admission chemistry sodium 134, glucose 136, hemoglobin A1c 5.8.  Echocardiogram with ejection fraction of 60 to 65%.  No wall motion abnormalities.  Follow-up head CT on 09/06/2019 personally reviewed, showing stable hemorrhage.  Currently maintained on aspirin for CVA prophylaxis.  Currently n.p.o. with alternative means of nutritional support.  EEG suggestive of cortical dysfunction left hemisphere likely secondary to underlying infarct as well as mild/moderate encephalopathy no definite epileptiform activity.  Hospital course further  complicated by bouts of urinary retentionwith post void residual of 900 mL.  Therapy evaluations completed with recommendations of physical medicine rehab consult.  Review of Systems  Unable to perform ROS: Mental acuity       Past Medical History:  Diagnosis Date  . Arthritis   . Atrial fibrillation ([place])   . Cataract    BILATERAL  . DDD (degenerative disc disease), cervical   . Depression   . Diverticulosis   . Falls   . GERD (gastroesophageal reflux disease)   . Glaucoma   . Headache   . Hyperlipidemia   . Hyperlipidemia   . Hypertension   . Osteoporosis   . Peripheral neuropathy   . Spondylosis    lumbar  . Subdural hematoma [facility])         Past Surgical History:  Procedure Laterality Date  . APPENDECTOMY    . BACK SURGERY  2010  . BURR HOLE FOR SUBDURAL HEMATOMA     x 2 hematoma  . COLONOSCOPY    . IR CT HEAD LTD  09/04/2019  . IR PERCUTANEOUS ART THROMBECTOMY/INFUSION INTRACRANIAL INC DIAG ANGIO  09/02/2019      . IR PERCUTANEOUS ART THROMBECTOMY/INFUSION INTRACRANIAL INC DIAG ANGIO  09/04/2019  . KNEE SURGERY Bilateral   . RADIOLOGY WITH ANESTHESIA N/A 09/02/2019   Procedure: IR WITH ANESTHESIA;  Surgeon: Radiologist, Medication, MD;  Location: [place];  Service: Radiology;  Laterality: N/A;  . VAGINAL HYSTERECTOMY          Family History  Problem Relation Age of Onset  . Heart disease Father   . Ovarian cancer Mother        ?  Marland Kitchen Breast cancer Sister   .  Colon cancer Neg Hx   . Diabetes Neg Hx   . Liver disease Neg Hx   . Kidney disease Neg Hx   . Dementia Neg Hx    Social History:  reports that she has quit smoking. She has never used smokeless tobacco. She reports current alcohol use of about 14.0 standard drinks of alcohol per week. She reports that she does not use drugs. Allergies:       Allergies  Allergen Reactions  . Codeine Other (See Comments)    unknown         Medications Prior to  Admission  Medication Sig Dispense Refill  . acetaminophen (TYLENOL) 650 MG CR tablet Take 650 mg by mouth 2 (two) times daily.     Marland Kitchen amLODipine (NORVASC) 10 MG tablet Take 10 mg by mouth daily.     . APPLE CIDER VINEGAR PO Take 1,200 mg by mouth daily.    . Ascorbic Acid (VITAMIN C) 1000 MG tablet Take 1,000 mg by mouth daily.    . Biotin 5000 MCG TABS Take 5,000 mcg by mouth daily.    . Cholecalciferol (VITAMIN D3 SUPER STRENGTH) 50 MCG (2000 UT) TABS Take 2,000 Units by mouth daily.    . clobetasol (TEMOVATE) 0.05 % external solution Apply 1 application topically daily as needed (scalp itching).    . diphenhydrAMINE HCl, Sleep, (ZZZQUIL PO) Take 3 capsules by mouth at bedtime.    . donepezil (ARICEPT) 10 MG tablet Take 10 mg by mouth at bedtime.    Marland Kitchen latanoprost (XALATAN) 0.005 % ophthalmic solution Place 1 drop into both eyes at bedtime.    Marland Kitchen loratadine (CLARITIN) 10 MG tablet Take 10 mg by mouth daily as needed for itching.     . losartan (COZAAR) 25 MG tablet Take 25 mg by mouth daily.    . Melatonin 10 MG TABS Take 30 mg by mouth at bedtime.     . meloxicam (MOBIC) 15 MG tablet Take 15 mg by mouth daily as needed for pain.     . metoprolol tartrate (LOPRESSOR) 50 MG tablet Take 50 mg by mouth 2 (two) times daily.     Marland Kitchen omeprazole (PRILOSEC) 40 MG capsule Take 40 mg by mouth daily.     Marland Kitchen OVER THE COUNTER MEDICATION Take 2 tablets by mouth daily. "Sugar Bear Hair"    . Psyllium (METAMUCIL PO) Take 1 Dose by mouth daily as needed (constipation).    . Sennosides (EX-LAX PO) Take 1 tablet by mouth daily as needed (constipation).    . sertraline (ZOLOFT) 50 MG tablet Take 50 mg by mouth daily.     . Simethicone (GAS-X PO) Take 1 tablet by mouth daily as needed (gas/bloating/flatulence).    . simvastatin (ZOCOR) 20 MG tablet Take 20 mg by mouth at bedtime.     . timolol (TIMOPTIC) 0.5 % ophthalmic solution Place 1 drop into both eyes daily.    Marland Kitchen  triamcinolone lotion (KENALOG) 0.1 % Apply 1 application topically daily as needed (itching).    . TURMERIC PO Take 200 mg by mouth daily.    . vitamin B-12 (CYANOCOBALAMIN) 1000 MCG tablet Take 3,000 mcg by mouth daily.      Home: Home Living Family/patient expects to be discharged to:: Private residence Living Arrangements: Alone Available Help at Discharge: Available PRN/intermittently Type of Home: Independent living facility Home Access: Level entry [place]: One level Bathroom Shower/Tub: Multimedia programmer: Handicapped height Bathroom Accessibility: Yes Home Equipment: Civil engineer, contracting, Museum/gallery curator  bars - toilet, Grab bars - tub/shower, Cane - single point, Environmental consultant - 2 wheels  Functional History: Prior Function Level of Independence: Independent with assistive device(s), Needs assistance ADL's / Homemaking Assistance Needed: has meals delivered, but can do breakfast and coffee, also has cleaning help every other week Comments: independent living cottage; cane; had assistance with cleaning; drives; has had "alot of falls" per daughter - none in past 6 months; does have life alert but she doesn't use it"very non-compliant" per daughter Functional Status:  Mobility: Bed Mobility Overal bed mobility: Needs Assistance Bed Mobility: Rolling, Sidelying to Sit, Sit to Sidelying Rolling: Mod assist, +2 for safety/equipment Sidelying to sit: HOB elevated, Mod assist, +2 for safety/equipment, +2 for physical assistance Sit to sidelying: +2 for physical assistance, Max assist General bed mobility comments: assist to roll to L due to R UE/LE weakness, assist for side to sit pt helping with L UE pushing on bed; to sidelying, pt able to manage trunk leaning on L elbow, but assist for legs into bed and scooting up with +2 Transfers Overall transfer level: Needs assistance Equipment used: 2 person hand held assist Transfers: Sit to/from Stand Sit to Stand: Max assist, +2 physical  assistance General transfer comment: assist for anterior weight shift and cues and assist for L lateral lean due to R LE weakness and leaning R at times, only up for about 10 seconds with R LE forward and knee in hyperextension Ambulation/Gait General Gait Details: pt unable  ADL: ADL Overall ADL's : Needs assistance/impaired Eating/Feeding: NPO Grooming: Moderate assistance Grooming Details (indicate cue type and reason): able to "brush teeth" with toothette after set up using L hand Upper Body Bathing: Moderate assistance, Sitting Lower Body Bathing: Maximal assistance, Sit to/from stand, Sitting/lateral leans Upper Body Dressing : Maximal assistance, Sitting Lower Body Dressing: Total assistance Toileting- Clothing Manipulation and Hygiene: Total assistance Functional mobility during ADLs: Maximal assistance, +2 for physical assistance  Cognition: Cognition Overall Cognitive Status: Difficult to assess Arousal/Alertness: Awake/alert Orientation Level: Oriented to person Attention: Sustained Sustained Attention: Appears intact (simple, functional tasks) Awareness: Impaired Awareness Impairment: Emergent impairment Cognition Arousal/Alertness: Awake/alert Behavior During Therapy: Flat affect Overall Cognitive Status: Difficult to assess Area of Impairment: Orientation, Attention, Following commands, Problem solving, Safety/judgement, Awareness Orientation Level: Disoriented to, Place, Situation Current Attention Level: Sustained Memory: Decreased short-term memory Following Commands: Follows one step commands consistently, Follows one step commands with increased time Safety/Judgement: Decreased awareness of safety, Decreased awareness of deficits Awareness: Emergent Problem Solving: Slow processing, Decreased initiation General Comments: daughter in the room and reports pt more alert and interactive in therapy session than previously yesterday or today since  extubated Difficult to assess due to: Impaired communication  Blood pressure 124/69, pulse 61, temperature 98.8 F (37.1 C), temperature source Axillary, resp. rate 18, height $RemoveBe'5\' 5"'rfMesoMAR$  (1.651 m), weight 84.3 kg, SpO2 96 %. Physical Exam Vitals reviewed.  Constitutional:      General: She is not in acute distress.    Comments: Lethargic  HENT:     Head: Normocephalic and atraumatic.     Right Ear: External ear normal.     Left Ear: External ear normal.     Nose: Nose normal.  Eyes:     General:        Right eye: No discharge.        Left eye: No discharge.     Comments: Only slightly opens eyes  Cardiovascular:     Comments: Irregularly irregular Pulmonary:  Effort: Pulmonary effort is normal. No respiratory distress.     Breath sounds: No stridor.  Abdominal:     General: Abdomen is flat. There is no distension.  Musculoskeletal:     Cervical back: Normal range of motion and neck supple.     Comments: No edema or tenderness in extremities  Skin:    General: Skin is warm and dry.  Neurological:     Comments: Lethargic Appears to have a left gaze preference.   Patient did not follow commands.   Overall examination limited Moving bilateral upper extremities, no spontaneous movement noted in bilateral lower extremities Appears perseverative  Psychiatric:     Comments: Unable to assess due to mentation/lethargy     Lab Results Last 24 Hours       Results for orders placed or performed during the hospital encounter of 09/02/19 (from the past 24 hour(s))  CBC     Status: None   Collection Time: 09/04/19  4:33 PM  Result Value Ref Range   WBC 10.0 4.0 - 10.5 K/uL   RBC 4.05 3.87 - 5.11 MIL/uL   Hemoglobin 13.1 12.0 - 15.0 g/dL   HCT 39.4 36 - 46 %   MCV 97.3 80.0 - 100.0 fL   MCH 32.3 26.0 - 34.0 pg   MCHC 33.2 30.0 - 36.0 g/dL   RDW 12.4 11.5 - 15.5 %   Platelets 202 150 - 400 K/uL   nRBC 0.0 0.0 - 0.2 %  Basic metabolic panel     Status: Abnormal    Collection Time: 09/04/19  4:33 PM  Result Value Ref Range   Sodium 134 (L) 135 - 145 mmol/L   Potassium 3.9 3.5 - 5.1 mmol/L   Chloride 97 (L) 98 - 111 mmol/L   CO2 24 22 - 32 mmol/L   Glucose, Bld 118 (H) 70 - 99 mg/dL   BUN 11 8 - 23 mg/dL   Creatinine, Ser 0.71 0.44 - 1.00 mg/dL   Calcium 9.4 8.9 - 10.3 mg/dL   GFR calc non Af Amer >60 >60 mL/min   GFR calc Af Amer >60 >60 mL/min   Anion gap 13 5 - 15  Glucose, capillary     Status: Abnormal   Collection Time: 09/04/19  7:39 PM  Result Value Ref Range   Glucose-Capillary 123 (H) 70 - 99 mg/dL  Glucose, capillary     Status: Abnormal   Collection Time: 09/04/19 11:15 PM  Result Value Ref Range   Glucose-Capillary 134 (H) 70 - 99 mg/dL  Glucose, capillary     Status: Abnormal   Collection Time: 09/05/19  3:22 AM  Result Value Ref Range   Glucose-Capillary 136 (H) 70 - 99 mg/dL  CBC     Status: None   Collection Time: 09/05/19  5:55 AM  Result Value Ref Range   WBC 9.1 4.0 - 10.5 K/uL   RBC 3.92 3.87 - 5.11 MIL/uL   Hemoglobin 12.6 12.0 - 15.0 g/dL   HCT 37.4 36 - 46 %   MCV 95.4 80.0 - 100.0 fL   MCH 32.1 26.0 - 34.0 pg   MCHC 33.7 30.0 - 36.0 g/dL   RDW 12.0 11.5 - 15.5 %   Platelets 205 150 - 400 K/uL   nRBC 0.0 0.0 - 0.2 %  Basic metabolic panel     Status: Abnormal   Collection Time: 09/05/19  5:55 AM  Result Value Ref Range   Sodium 132 (L) 135 - 145 mmol/L  Potassium 4.0 3.5 - 5.1 mmol/L   Chloride 96 (L) 98 - 111 mmol/L   CO2 26 22 - 32 mmol/L   Glucose, Bld 119 (H) 70 - 99 mg/dL   BUN 15 8 - 23 mg/dL   Creatinine, Ser 0.63 0.44 - 1.00 mg/dL   Calcium 9.3 8.9 - 10.3 mg/dL   GFR calc non Af Amer >60 >60 mL/min   GFR calc Af Amer >60 >60 mL/min   Anion gap 10 5 - 15  Glucose, capillary     Status: Abnormal   Collection Time: 09/05/19  7:41 AM  Result Value Ref Range   Glucose-Capillary 151 (H) 70 - 99 mg/dL  Glucose, capillary     Status: Abnormal    Collection Time: 09/05/19 12:04 PM  Result Value Ref Range   Glucose-Capillary 151 (H) 70 - 99 mg/dL      Imaging Results (Last 48 hours)  CT Head Wo Contrast  Result Date: 09/05/2019 CLINICAL DATA:  Stroke follow-up.  Subdural hematoma history. EXAM: CT HEAD WITHOUT CONTRAST TECHNIQUE: Contiguous axial images were obtained from the base of the skull through the vertex without intravenous contrast. COMPARISON:  Brain MRI from 2 days ago FINDINGS: Brain: Essentially stable subarachnoid blood along the lower left sylvian fissure and adjacent sulci. Hazy hemorrhage at the left putamen is also non progressed. Cytotoxic edema is progressively apparent at the left basal ganglia and adjacent white matter tracks, expected. No interval hemorrhage, hydrocephalus, or masslike finding. Brain atrophy. Vascular: Streak artifact with no detected hyperdense vessel. Skull: Remote burr holes on the right.  No acute finding Sinuses/Orbits: Negative IMPRESSION: Left basal ganglia and inferior division infarcts without detected progression. Unchanged petechial hemorrhage at the basal ganglia and subarachnoid hemorrhage along the sylvian fissure. Electronically Signed   By: Monte Fantasia M.D.   On: 09/05/2019 07:35   MR BRAIN WO CONTRAST  Result Date: 09/03/2019 CLINICAL DATA:  Left MCA stroke post thrombectomy EXAM: MRI HEAD WITHOUT CONTRAST TECHNIQUE: Multiplanar, multiecho pulse sequences of the brain and surrounding structures were obtained without intravenous contrast. COMPARISON:  Recent CT imaging, MRI brain 2018 FINDINGS: Brain: There is reduced diffusion involving the left basal ganglia and adjacent white matter, a small area within the anteromedial left temporal lobe, and small areas of the parietal and posterior left temporal lobes. There is corresponding susceptibility primarily in the basal ganglia region reflecting petechial reperfusion hemorrhage. Diffusion hyperintensity and susceptibility along the  left MCA cistern and sylvian fissure are compatible with subarachnoid hemorrhage. There is trace layering intraventricular hemorrhage in the occipital horns likely reflecting redistribution of the subarachnoid hemorrhage. Prominence of the ventricles and sulci reflects generalized parenchymal volume loss. Confluent areas of T2 hyperintensity in the supratentorial greater than pontine white matter are nonspecific but probably reflect advanced chronic microvascular ischemic changes. There is no significant mass effect. Vascular: Major vessel flow voids at the skull base are preserved. Skull and upper cervical spine: Normal marrow signal is preserved. Sinuses/Orbits: Mild mucosal thickening. Bilateral lens replacements. Other: Sella is unremarkable.  Mastoid air cells are clear. IMPRESSION: Acute infarcts of the left MCA territory involving the basal ganglia and adjacent white matter and parietotemporal lobes. Petechial reperfusion hemorrhage primarily involving the basal ganglia and small volume subarachnoid hemorrhage as seen on CT. Advanced chronic microvascular ischemic changes. Electronically Signed   By: Macy Mis M.D.   On: 09/03/2019 14:35   IR CT Head Ltd  Result Date: 09/04/2019 INDICATION: 86 year old female with past medical history significant for atrial  fibrillation (not on anticoagulation due to prior subdural hematoma), hypertension, hyperlipidemia, heart disease, mild cognitive impairment of vascular etiology complicated by alcohol use with a premorbid Rankin scale of 0. She presented to ED with aphasia, left gaze deviation and nonrhythmic shaking of the left side, NIHSS 22. She was last known well at 2:30 p.m. on 09/01/2019. No IV tPA administered as patient was outside of the window. Head CT showed prominent white matter disease and atrophy with hyperdense left MCA without large acute infarct or hemorrhage. CT angiogram of the head and neck showed a proximal left M1/MCA occlusion extending  into the M2 segment. CT perfusion showed a core infarct of 4 mL with a penumbra of 83 mL. She was then transferred to our service for a diagnostic cerebral angiogram and emergency mechanical thrombectomy. EXAM: DIAGNOSTIC CEREBRAL ANGIOGRAM MECHANICAL THROMBECTOMY FLAT PANEL HEAD CT COMPARISON:  CT/CT angiogram of the head and neck September 02, 2019 MEDICATIONS: No antibiotics given. ANESTHESIA/SEDATION: The procedure was performed in the general anesthesia. FLUOROSCOPY TIME:  Fluoroscopy Time: 31 minutes 12 seconds (678 mGy). COMPLICATIONS: None immediate. TECHNIQUE: Informed written consent was obtained from the patient's daughter after a thorough discussion of the procedural risks, benefits and alternatives. All questions were addressed. Maximal Sterile Barrier Technique was utilized including caps, mask, sterile gowns, sterile gloves, sterile drape, hand hygiene and skin antiseptic. A timeout was performed prior to the initiation of the procedure. The right groin was prepped and draped in the usual sterile fashion. Using a micropuncture kit and the modified Seldinger technique, access was gained to the right common femoral artery and an 8 French sheath was placed. Under fluoroscopy, an 8 [place] Walrus balloon guide catheter was navigated over a 6 [place] VTK catheter and a 0.035" Terumo Glidewire into the aortic arch. The catheter was placed into the left common carotid artery and then advanced into the left internal carotid artery. The inner catheter was removed. Frontal and lateral angiograms of the head were obtained. FINDINGS: Occlusion of the left middle cerebral artery at its origin. Fair collateral flow is seen from the left ACA to the left MCA vascular tree. PROCEDURE: Under biplane roadmap, a zoom 71 aspiration catheter was navigated over a phenom 21 microcatheter and a synchro support microguidewire into the cavernous segment of the right ICA. The microcatheter was then navigated over the wire into the  left M2/MCA inferior division branch. Then, a 5 x 37 mm embotrap stent retriever was deployed spanning the M1 and M2 segments. The device was allowed to intercalated with the clot for 4 minutes. The microcatheter was removed. The aspiration catheter was advanced to the level of occlusion and connected to a penumbra aspiration pump. The guiding catheter balloon was inflated. The thrombectomy device and aspiration catheter were removed under constant aspiration. Follow-up left ICA angiogram showed recanalization of most of the M1 segment with occlusion at the level of the bifurcation. Under biplane roadmap, a zoom 71 aspiration catheter was navigated over a phenom 21 microcatheter and a synchro support microguidewire into the cavernous segment of the right ICA. The microcatheter was then navigated over the wire into the left M2/MCA superior division branch. Then, a 5 x 37 mm embotrap stent retriever was deployed spanning the M1 and M2 segments. The device was allowed to intercalated with the clot for 4 minutes. The microcatheter was removed. The aspiration catheter was advanced to the level of occlusion and connected to a penumbra aspiration pump. The guiding catheter balloon was inflated. The thrombectomy device and  aspiration catheter were removed under constant aspiration. Follow-up left ICA angiogram showed recanalization of the M1 segment and superior division with persistent occlusion of the inferior division. Under biplane roadmap, a zoom 71 aspiration catheter was navigated over a phenom 21 microcatheter and a synchro support microguidewire into the cavernous segment of the right ICA. The microcatheter was then navigated over the wire into the left M2/MCA inferior division branch. Then, a 5 x 37 mm embotrap stent retriever was deployed spanning the M1 and M2 segments. The device was allowed to intercalated with the clot for 4 minutes. The microcatheter was removed. The aspiration catheter was advanced to the  level of occlusion and connected to a penumbra aspiration pump. The guiding catheter balloon was inflated. The thrombectomy device and aspiration catheter were removed under constant aspiration. Follow-up left ICA angiogram showed complete recanalization of the left MCA vascular tree (TICI3). Flat panel CT of the head was obtained and post processed in a separate workstation with concurrent attending physician supervision. Selected images were sent to PACS. Contrast staining in the left basal ganglia was noted. Small subarachnoid hemorrhage versus contrast extravasation in the left sylvian fissure. Delayed follow-up angiogram showed persistent patency of the left MCA vascular tree with no evidence of embolus to new or distal territory. The catheter was subsequently withdrawn. Right femoral artery angiograms were obtained with right anterior oblique and lateral views. The puncture is at the level of the common femoral artery. The femoral sheath was exchanged over the wire for a Perclose ProGlide which was utilized for access closure. Immediate hemostasis was achieved. IMPRESSION: 1. Successful mechanical thrombectomy for treatment of a left M1/MCA proximal occlusion. Three passes performed with combined technique (aspiration left stent retriever) with complete recanalization. 2. No embolus to new or distal territory. 3. Small left sylvian subarachnoid hemorrhage versus process extravasation. PLAN: - Transfer to ICU- Maintain intubated as no covid-19 test result available- SBP 120-140 mmHg- Bed rest post femoral access x6 hours Electronically Signed   By: Pedro Earls M.D.   On: 09/04/2019 13:09   IR PERCUTANEOUS ART THROMBECTOMY/INFUSION INTRACRANIAL INC DIAG ANGIO  Result Date: 09/04/2019 INDICATION: 86 year old female with past medical history significant for atrial fibrillation (not on anticoagulation due to prior subdural hematoma), hypertension, hyperlipidemia, heart disease, mild  cognitive impairment of vascular etiology complicated by alcohol use with a premorbid Rankin scale of 0. She presented to ED with aphasia, left gaze deviation and nonrhythmic shaking of the left side, NIHSS 22. She was last known well at 2:30 p.m. on 09/01/2019. No IV tPA administered as patient was outside of the window. Head CT showed prominent white matter disease and atrophy with hyperdense left MCA without large acute infarct or hemorrhage. CT angiogram of the head and neck showed a proximal left M1/MCA occlusion extending into the M2 segment. CT perfusion showed a core infarct of 4 mL with a penumbra of 83 mL. She was then transferred to our service for a diagnostic cerebral angiogram and emergency mechanical thrombectomy. EXAM: DIAGNOSTIC CEREBRAL ANGIOGRAM MECHANICAL THROMBECTOMY FLAT PANEL HEAD CT COMPARISON:  CT/CT angiogram of the head and neck September 02, 2019 MEDICATIONS: No antibiotics given. ANESTHESIA/SEDATION: The procedure was performed in the general anesthesia. FLUOROSCOPY TIME:  Fluoroscopy Time: 31 minutes 12 seconds (678 mGy). COMPLICATIONS: None immediate. TECHNIQUE: Informed written consent was obtained from the patient's daughter after a thorough discussion of the procedural risks, benefits and alternatives. All questions were addressed. Maximal Sterile Barrier Technique was utilized including caps, mask, sterile gowns,  sterile gloves, sterile drape, hand hygiene and skin antiseptic. A timeout was performed prior to the initiation of the procedure. The right groin was prepped and draped in the usual sterile fashion. Using a micropuncture kit and the modified Seldinger technique, access was gained to the right common femoral artery and an 8 French sheath was placed. Under fluoroscopy, an 8 [place] Walrus balloon guide catheter was navigated over a 6 [place] VTK catheter and a 0.035" Terumo Glidewire into the aortic arch. The catheter was placed into the left common carotid artery and then  advanced into the left internal carotid artery. The inner catheter was removed. Frontal and lateral angiograms of the head were obtained. FINDINGS: Occlusion of the left middle cerebral artery at its origin. Fair collateral flow is seen from the left ACA to the left MCA vascular tree. PROCEDURE: Under biplane roadmap, a zoom 71 aspiration catheter was navigated over a phenom 21 microcatheter and a synchro support microguidewire into the cavernous segment of the right ICA. The microcatheter was then navigated over the wire into the left M2/MCA inferior division branch. Then, a 5 x 37 mm embotrap stent retriever was deployed spanning the M1 and M2 segments. The device was allowed to intercalated with the clot for 4 minutes. The microcatheter was removed. The aspiration catheter was advanced to the level of occlusion and connected to a penumbra aspiration pump. The guiding catheter balloon was inflated. The thrombectomy device and aspiration catheter were removed under constant aspiration. Follow-up left ICA angiogram showed recanalization of most of the M1 segment with occlusion at the level of the bifurcation. Under biplane roadmap, a zoom 71 aspiration catheter was navigated over a phenom 21 microcatheter and a synchro support microguidewire into the cavernous segment of the right ICA. The microcatheter was then navigated over the wire into the left M2/MCA superior division branch. Then, a 5 x 37 mm embotrap stent retriever was deployed spanning the M1 and M2 segments. The device was allowed to intercalated with the clot for 4 minutes. The microcatheter was removed. The aspiration catheter was advanced to the level of occlusion and connected to a penumbra aspiration pump. The guiding catheter balloon was inflated. The thrombectomy device and aspiration catheter were removed under constant aspiration. Follow-up left ICA angiogram showed recanalization of the M1 segment and superior division with persistent occlusion  of the inferior division. Under biplane roadmap, a zoom 71 aspiration catheter was navigated over a phenom 21 microcatheter and a synchro support microguidewire into the cavernous segment of the right ICA. The microcatheter was then navigated over the wire into the left M2/MCA inferior division branch. Then, a 5 x 37 mm embotrap stent retriever was deployed spanning the M1 and M2 segments. The device was allowed to intercalated with the clot for 4 minutes. The microcatheter was removed. The aspiration catheter was advanced to the level of occlusion and connected to a penumbra aspiration pump. The guiding catheter balloon was inflated. The thrombectomy device and aspiration catheter were removed under constant aspiration. Follow-up left ICA angiogram showed complete recanalization of the left MCA vascular tree (TICI3). Flat panel CT of the head was obtained and post processed in a separate workstation with concurrent attending physician supervision. Selected images were sent to PACS. Contrast staining in the left basal ganglia was noted. Small subarachnoid hemorrhage versus contrast extravasation in the left sylvian fissure. Delayed follow-up angiogram showed persistent patency of the left MCA vascular tree with no evidence of embolus to new or distal territory. The catheter was  subsequently withdrawn. Right femoral artery angiograms were obtained with right anterior oblique and lateral views. The puncture is at the level of the common femoral artery. The femoral sheath was exchanged over the wire for a Perclose ProGlide which was utilized for access closure. Immediate hemostasis was achieved. IMPRESSION: 1. Successful mechanical thrombectomy for treatment of a left M1/MCA proximal occlusion. Three passes performed with combined technique (aspiration left stent retriever) with complete recanalization. 2. No embolus to new or distal territory. 3. Small left sylvian subarachnoid hemorrhage versus process extravasation.  PLAN: - Transfer to ICU- Maintain intubated as no covid-19 test result available- SBP 120-140 mmHg- Bed rest post femoral access x6 hours Electronically Signed   By: Pedro Earls M.D.   On: 09/04/2019 13:09     Assessment/Plan: Diagnosis: Left putamen nonhemorrhagic infarct Stroke: Continue secondary stroke prophylaxis and Risk Factor Modification listed below:   Blood Pressure Management:  Continue current medication with prn's with permisive HTN per primary team Prediabetes management:   ?hemiparesis: fit for orthosis to prevent contractures (resting hand splint for day, wrist cock up splint at night, PRAFO, etc) PT/OT for mobility, ADL training  Labs and images (see above) independently reviewed.  Records reviewed and summated above.  1. Does the need for close, 24 hr/day medical supervision in concert with the patient's rehab needs make it unreasonable for this patient to be served in a less intensive setting? Yes 2. Co-Morbidities requiring supervision/potential complications: atrial fibrillation (anticoagulation on hold due to history of SDH), HTN (monitor and provide prns in accordance with increased physical exertion and pain), hyperlipidemia, CAD, mild cognitive impairment of vascular etiology, alcohol use (counsel when appropriate), hyponatremia (repeat labs, treat if necessary) 3. Due to bladder management, bowel management, safety, skin/wound care, disease management, medication administration, pain management and patient education, does the patient require 24 hr/day rehab nursing? Yes 4. Does the patient require coordinated care of a physician, rehab nurse, therapy disciplines of PT/OT/SLP to address physical and functional deficits in the context of the above medical diagnosis(es)? Yes Addressing deficits in the following areas: balance, endurance, locomotion, strength, transferring, bowel/bladder control, bathing, dressing, feeding, grooming, toileting, cognition,  speech, language, swallowing and psychosocial support 5. Can the patient actively participate in an intensive therapy program of at least 3 hrs of therapy per day at least 5 days per week? Potentially 6. The potential for patient to make measurable gains while on inpatient rehab is excellent 7. Anticipated functional outcomes upon discharge from inpatient rehab are supervision and min assist  with PT, supervision and min assist with OT, modified independent and supervision with SLP. 8. Estimated rehab length of stay to reach the above functional goals is: 20-24 days. 9. Anticipated discharge destination: Home 10. Overall Rehab/Functional Prognosis: good  RECOMMENDATIONS: This patient's condition is appropriate for continued rehabilitative care in the following setting: CIR when medically stable able tolerate 3 hours of therapy per day. Patient has agreed to participate in recommended program. Potentially Note that insurance prior authorization may be required for reimbursement for recommended care.  Comment: Rehab Admissions Coordinator to follow up.  I have personally performed a face to face diagnostic evaluation, including, but not limited to relevant history and physical exam findings, of this patient and developed relevant assessment and plan.  Additionally, I have reviewed and concur with the physician assistant's documentation above.   Delice Lesch, MD, ABPMR  Lavon Paganini Angiulli, PA-C 09/05/2019        Revision History  Routing History           Note Details  Author Posey Pronto, Domenick Bookbinder, MD File Time 09/06/2019 4:05 PM  Author Type Physician Status Signed  Last Editor Jamse Arn, MD Service Physical Medicine and Rehabilitation

## 2019-09-07 NOTE — Progress Notes (Signed)
Retta Diones, RN  Rehab Admission Coordinator  Physical Medicine and Rehabilitation  PMR Pre-admission    Signed  Date of Service:  09/05/2019 3:50 PM      Related encounter: ED to Hosp-Admission (Current) from 09/02/2019 in [facility]      Signed       Show:Clear all _0 Manual_1 Template_2 Copied  Added by: _3 Retta Diones, RN_4 Genella Mech, CCC-SLP  _5 Hover for details PMR Admission Coordinator Pre-Admission Assessment  Patient: [name] is an 86 y.o., female MRN: [id number] DOB: Aug 25, 1932 Height: _6  (165.1 cm) Weight: 82.2 kg                                                                          Insurance Information HMO: PPO: PCP: IPA: 80/20: OTHER:  PRIMARY:Medicare a and bPolicy#:[id number] Subscriber: pt Benefits: Phone #: passport one onlineName: Patient  Eff. Date:a 01/20/2000 and b 1/1/2011Deduct: $$0630ZSWof Pocket Max: noneLife Max: none CIR:100%SNF: 20 full days; 80 copay days Outpatient:80%Co-Pay: 20% Home Health:100%Co-Pay: none DME:80%Co-Pay: 20% Providers:pt choice  SECONDARY: BWalgreen##:[id number]       The "Data Collection Information Summary" for patients in Inpatient Rehabilitation Facilities with attached "Privacy Act SPicachoRecords" was provided and verbally reviewed with: Patient and Family  Emergency Contact Information         Contact Information    Name Relation Home Work MManitou SColoradoDaughter   [phone]  MPamella PertDaughter   [phone]    Current Medical History  Patient Admitting Diagnosis: Left putamen nonhemorrhagic infarct   History of Present Illness: An 86year old right-handed female with history of atrial fibrillation not on anticoagulation secondary to prior subdural hematoma, hypertension, hyperlipidemia, CAD, mild  cognitive impairment of vascular etiology complicated by alcohol use maintained on Aricept. History taken from chart review. [facility] [place] Present[place] 09/02/2019 with right side weakness and aphasia. EMS noted blood pressure of 180s/90s. Admission chemistry sodium 134, glucose 136, hemoglobin A1c 5.8. Cranial CT scan showed no acute intracranial hemorrhage. 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 Near occlusive thrombus within the proximal left M1 MCA extending into M2 branch. Patient did not receive TPA. Echocardiogram with ejection fraction of 60 to 65% no wall motion abnormalities. Patient did undergo complete recanalization per interventional radiologyand was intubated for a short time. Follow-up head CT 09/06/2019 after revascularization procedure showed petechial hemorrhage at the basal ganglia and small subarachnoid hemorrhage along the sylvian fissure and again scan completed 09/06/2019 showing no evidence of new hemorrhage. Hemorrhagic infarct left putamen stable. Small amount hemorrhage sylvian fissure and left superior temporal lobe anteriorly stable. Currently maintained on aspirin for CVA prophylaxis. Currently n.p.o. with alternative means of nutritional support. EEG suggestive of cortical dysfunction left hemisphere likely secondary to underlying infarct as well as mild to moderate encephalopathy no seizure activity. Hospital course further complicated by bouts of urinary retention post void residual 900 mL. Therapy evaluation completed and patient was admitted for a comprehensive rehab program.  Complete NIHSS TOTAL: 17 Glasgow Coma Scale Score: 14  Past Medical History      Past Medical History:  Diagnosis Date  . Arthritis   . Atrial fibrillation ([facility])   . Cataract    BILATERAL  . DDD (degenerative disc disease), cervical   . Depression   . Diverticulosis   . Falls   .  GERD (gastroesophageal reflux disease)   . Glaucoma   . Headache   . Hyperlipidemia   . Hyperlipidemia   . Hypertension   . Osteoporosis   . Peripheral neuropathy   . Spondylosis    lumbar  . Subdural hematoma ([facility])     Family History  family history includes Breast cancer in her sister; Heart disease in her father; Ovarian cancer in her mother.  Prior Rehab/Hospitalizations:  Has the patient had prior rehab or hospitalizations prior to admission? No  Has the patient had major surgery during 100 days prior to admission? Yes  Current Medications   Current Facility-Administered Medications:  .   stroke: mapping our early stages of recovery book, , Does not apply, Once, Bhagat, Srishti L, MD .  0.9 %  sodium chloride infusion, , Intravenous, Continuous, Bhagat, Srishti L, MD, Last Rate: 50 mL/hr at 09/07/19 0529, New Bag at 09/07/19 0529 .  acetaminophen (TYLENOL) tablet 650 mg, 650 mg, Oral, Q4H PRN **OR** acetaminophen (TYLENOL) 160 MG/5ML solution 650 mg, 650 mg, Per Tube, Q4H PRN, 650 mg at 09/03/19 1526 **OR** acetaminophen (TYLENOL) suppository 650 mg, 650 mg, Rectal, Q4H PRN, Bhagat, Srishti L, MD .  amantadine (SYMMETREL) capsule 100 mg, 100 mg, Oral, Daily **OR** amantadine (SYMMETREL) 50 MG/5ML solution 100 mg, 100 mg, Per Tube, Daily, Donnetta Simpers, MD, 100 mg at 09/07/19 1158 .  amLODipine (NORVASC) tablet 10 mg, 10 mg, Per Tube, Daily, Crim, Courtney, MD, 10 mg at 09/07/19 1158 .  aspirin suppository 300 mg, 300 mg, Rectal, Daily **OR** aspirin chewable tablet 81 mg, 81 mg, Per Tube, Daily, Biby, Sharon L, NP, 81 mg at 09/07/19 1158 .  atorvastatin (LIPITOR) tablet 40 mg, 40 mg, Per Tube, Daily, Rinehuls, David L, PA-C, 40 mg at 09/07/19 1158 .  chlorhexidine gluconate (MEDLINE KIT) (PERIDEX) 0.12 % solution 15 mL, 15 mL, Mouth Rinse, BID, de Sindy Messing, [place], MD, 15 mL at 09/07/19 1100 .  Chlorhexidine Gluconate Cloth 2 % PADS 6 each, 6 each,  Topical, Daily, de Sindy Messing, [place], MD, 6 each at 09/06/19 1020 .  donepezil (ARICEPT) tablet 10 mg, 10 mg, Per Tube, QHS, Donnamae Jude, RPH, 10 mg at 09/06/19 2152 .  feeding supplement (OSMOLITE 1.2 CAL) liquid 1,000 mL, 1,000 mL, Per Tube, Continuous, Garvin Fila, MD, Last Rate: 55 mL/hr at 09/06/19 1900, 1,000 mL at 09/06/19 1900 .  feeding supplement (PROSource TF) liquid 45 mL, 45 mL, Per Tube, BID, Garvin Fila, MD, 45 mL at 09/07/19 1159 .  fentaNYL (SUBLIMAZE) injection 25-100 mcg, 25-100 mcg, Intravenous, Q2H PRN, Anders Simmonds, MD, 25 mcg at 09/04/19 0035 .  glycopyrrolate (ROBINUL) injection 0.1 mg, 0.1 mg, Intravenous, Q4H PRN, Desai, Rahul P, PA-C, 0.1 mg at 09/07/19 1117 .  latanoprost (XALATAN) 0.005 % ophthalmic solution 1 drop, 1 drop, Both Eyes, QHS, Biby, Sharon L, NP, 1 drop at 09/06/19 2154 .  losartan (COZAAR) tablet 25 mg, 25 mg, Per Tube, Daily, Crim, Courtney, MD, 25 mg at 09/07/19 1158 .  MEDLINE mouth rinse, 15 mL, Mouth Rinse, 10 times per day, de Sindy Messing, Erven Colla, MD, 15 mL at 09/07/19 1100 .  metoprolol tartrate (LOPRESSOR) 25 mg/10 mL oral suspension 50 mg, 50 mg, Per Tube,  BID, Crim, Loma Sousa, MD, 50 mg at 09/07/19 1158 .  multivitamin liquid 15 mL, 15 mL, Per Tube, Daily, Donnamae Jude, RPH, 15 mL at 09/07/19 1158 .  pantoprazole sodium (PROTONIX) 40 mg/20 mL oral suspension 40 mg, 40 mg, Per Tube, QHS, Donnamae Jude, RPH, 40 mg at 09/06/19 2154 .  senna-docusate (Senokot-S) tablet 1 tablet, 1 tablet, Oral, QHS PRN, Bhagat, Srishti L, MD .  sertraline (ZOLOFT) tablet 50 mg, 50 mg, Per Tube, Daily, Donnamae Jude, RPH, 50 mg at 09/07/19 1158 .  timolol (TIMOPTIC) 0.5 % ophthalmic solution 1 drop, 1 drop, Both Eyes, Daily, Bhagat, Srishti L, MD, 1 drop at 09/07/19 1216 .  triamcinolone cream (KENALOG) 0.1 % 1 application, 1 application, Topical, Daily PRN, Biby, Sharon L, NP .  vitamin B-12 (CYANOCOBALAMIN) tablet 3,000 mcg, 3,000 mcg,  Per Tube, Daily, Donnamae Jude, RPH, 3,000 mcg at 09/07/19 1158  Patients Current Diet:     Diet Order    None      Precautions / Restrictions Precautions Precautions: Fall Precaution Comments: corerack; R inattention Restrictions Weight Bearing Restrictions: No   Has the patient had 2 or more falls or a fall with injury in the past year?Yes  Prior Activity Level  Pt. Was living in an independent living facility, but was driving and performing all ADLs with the exception of medication management prior to admission.   Prior Functional Level Prior Function Level of Independence: Independent with assistive device(s), Needs assistance ADL's / Homemaking Assistance Needed: has meals delivered, but can do breakfast and coffee, also has cleaning help every other week Comments: independent living cottage; cane; had assistance with cleaning; drives; has had "alot of falls" per daughter - none in past 6 months; does have life alert but she doesn't use it"very non-compliant" per daughter  Self Care: Did the patient need help bathing, dressing, using the toilet or eating?  Independent  Indoor Mobility: Did the patient need assistance with walking from room to room (with or without device)? Independent  Stairs: Did the patient need assistance with internal or external stairs (with or without device)? Independent  Functional Cognition: Did the patient need help planning regular tasks such as shopping or remembering to take medications? Independent  Development worker, international aid / Equipment Home Equipment: Shower seat, Grab bars - toilet, Grab bars - tub/shower, Cane - single point, Walker - 2 wheels  Prior Device Use: Indicate devices/aids used by the patient prior to current illness, exacerbation or injury? Walker  Current Functional Level Cognition  Arousal/Alertness: Awake/alert Overall Cognitive Status: Impaired/Different from baseline Difficult to assess due to: Impaired  communication Current Attention Level: Sustained Orientation Level: Oriented to person Following Commands: Follows one step commands with increased time Safety/Judgement: Decreased awareness of deficits General Comments: Pt often saying "yes" without consistent accuracy.  multimodal cues for understandinhg, when testing sensation, pt said "yes" throughout even when not touching, R inattention.  Attention: Sustained Sustained Attention: Appears intact (simple, functional tasks) Awareness: Impaired Awareness Impairment: Emergent impairment    Extremity Assessment (includes Sensation/Coordination)  Upper Extremity Assessment: RUE deficits/detail RUE Deficits / Details: no spontaneous movement noted; abnormal tone; no shoulder shrug observed; not using as functional assist RUE Sensation: decreased light touch, decreased proprioception RUE Coordination: decreased fine motor, decreased gross motor  Lower Extremity Assessment: Defer to PT evaluation RLE Deficits / Details: PROM/AAROM WFL, strength 2-/5 hip flexion, 1+/5 knee extension, no active DF noted RLE Sensation: decreased light touch LLE Deficits / Details: AROM WFL,  strength hip flexion 4-/5, knee extension 4/5, ankle DF 4/5    ADLs  Overall ADL's : Needs assistance/impaired Eating/Feeding: NPO Grooming: Moderate assistance Grooming Details (indicate cue type and reason): able to "brush teeth" with to[place]te after set up using L hand Upper Body Bathing: Moderate assistance, Sitting Lower Body Bathing: Maximal assistance, Sit to/from stand, Sitting/lateral leans Upper Body Dressing : Maximal assistance, Sitting Lower Body Dressing: Total assistance Toileting- Clothing Manipulation and Hygiene: Total assistance Functional mobility during ADLs: Maximal assistance, +2 for physical assistance    Mobility  Overal bed mobility: Needs Assistance Bed Mobility: Rolling, Sidelying to Sit, Sit to Supine Rolling: Mod assist Sidelying  to sit: Mod assist, HOB elevated Sit to supine: Max assist Sit to sidelying: +2 for physical assistance, Max assist General bed mobility comments: Pt was able, with multimodal cues to roll to her right using R bed rail, assist in progressing her left leg over to the side of the bed and scoot, Mod assist overall to help progress trunk up to sitting and scoot the rest of the way to EOB.  Max assist to return to supine due to assist at both trunk and legs.      Transfers  Overall transfer level: Needs assistance Equipment used: 2 person hand held assist Transfers: Sit to/from Stand Sit to Stand: +2 physical assistance, Max assist General transfer comment: would try the stedy next session with second person assist.     Ambulation / Gait / Stairs / Wheelchair Mobility  Ambulation/Gait General Gait Details: pt unable    Posture / Balance Dynamic Sitting Balance Sitting balance - Comments: up to mod assist EOB, but as good as min guard assist for sitting balance.  Practiced leaning to both sides and coming back up x 10, sitting in midline, LE exercises, UE exercises, and ADL task of combing her hair with her left hand and using the [place] suction with her left hand.  Balance Overall balance assessment: Needs assistance Sitting-balance support: Feet supported, Bilateral upper extremity supported Sitting balance-Leahy Scale: Poor Sitting balance - Comments: up to mod assist EOB, but as good as min guard assist for sitting balance.  Practiced leaning to both sides and coming back up x 10, sitting in midline, LE exercises, UE exercises, and ADL task of combing her hair with her left hand and using the [place] suction with her left hand.  Postural control: Right lateral lean Standing balance support: Single extremity supported Standing balance-Leahy Scale: Zero Standing balance comment: +2 A for standing    Special needs/care consideration Skin: intact Special service needs: Pt. Currently  with C[place] in place Designated visitor Luberta Robertson (daughter)     Previous Home Environment (from acute therapy documentation) Living Arrangements: Alone Available Help at Discharge: Available PRN/intermittently Type of Home: Independent living facility Home Layout: One level Home Access: Level entry Bathroom Shower/Tub: Multimedia programmer: Handicapped height Bathroom Accessibility: Yes How Accessible: Accessible via wheelchair  Discharge Living Setting Plans for Discharge Living Setting: Apartment Type of Home at Discharge: [place] Name at Discharge: [place] Discharge Home Layout: One level Discharge Home Access: Level entry Discharge Bathroom Shower/Tub: Walk-in shower Discharge Bathroom Toilet: Handicapped height Discharge Bathroom Accessibility: Yes How Accessible: Accessible via walker Does the patient have any problems obtaining your medications?: No  Social/Family/Support Systems Patient Roles: Other (Comment) Contact Information: [phone] Anticipated Caregiver: Wilmer Floor (daughter) Anticipated Caregiver's Contact Information: [phone] Caregiver Availability: 24/7 Discharge Plan Discussed with Primary Caregiver: Yes Is Caregiver  In Agreement with Plan?: Yes  Goals Patient/Family Goal for Rehab: PT/OT  S/min A, SLP Mod I/ S Expected Length of Stay: 20-24 days Pt. And family Agrees to admission: Yes Program orientation Provided and reviewed with pt.: yes   Decrease burden of Care through IP rehab admission: Specialzed equipment needs, Diet advancement, Decrease number of caregivers, Bowel and bladder program and Patient/family education  Possible need for SNF placement upon discharge: Not anticipated   Patient Condition: This patient's condition remains as documented in the consult dated 09/06/19, in which the Rehabilitation Physician determined and documented that the patient's  condition is appropriate for intensive rehabilitative care in an inpatient rehabilitation facility. Will admit to inpatient rehab today.  Preadmission Screen Completed By:  Retta Diones, RN, 09/07/2019 12:43 PM ______________________________________________________________________   Discussed status with Dr. Naaman Plummer and Dr. Alveta Heimlich on 09/07/19 at 1000 am and received approval for admission today.  Admission Coordinator:  Retta Diones, time 1243/Date 09/07/19           Cosigned by: Courtney Heys, MD at 09/07/2019 1:05 PM  Revision History                                                   Note Details  Author Retta Diones, RN File Time 09/07/2019 12:43 PM  Author Type Rehab Admission Coordinator Status Signed  Last Editor Retta Diones, RN Service Physical Medicine and Rehabilitation

## 2019-09-07 NOTE — Progress Notes (Signed)
Pt arrived via bed daughter Casimer Bilis) with patient. VSS tube feed infusing via coretrak. CIR policy for covid, visitors, and care reviewed with family and patient. Safety care plan reviewed with family and patient. Questions answered.

## 2019-09-07 NOTE — H&P (Signed)
Physical Medicine and Rehabilitation Admission H&P    Chief Complaint  Patient presents with  . Code Stroke  : HPI: [name] is an 86 year old right-handed female with history of atrial fibrillation not on anticoagulation secondary to prior subdural hematoma, hypertension, hyperlipidemia, CAD, mild cognitive impairment of vascular etiology complicated by alcohol use maintained on Aricept.  History taken from chart review.  Patient resides independent living facility at [facility] in [place].  Presented 09/02/2019 with right side weakness and aphasia.  EMS noted blood pressure of 180s/90s.  Admission chemistry sodium 134, glucose 136, hemoglobin A1c 5.8.  Cranial CT scan showed no acute intracranial hemorrhage.  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  Near occlusive thrombus within the proximal left M1 MCA extending into M2 branch.  Patient did not receive TPA.  Echocardiogram with ejection fraction of 60 to 65% no wall motion abnormalities.  Patient did undergo complete recanalization per interventional radiology and was intubated for a short time.    Follow-up head CT 09/06/2019 after revascularization procedure showed petechial hemorrhage at the basal ganglia and small subarachnoid hemorrhage along the sylvian fissure and again scan completed 09/06/2019 showing no evidence of new hemorrhage.  Hemorrhagic infarct left putamen stable.  Small amount hemorrhage sylvian fissure and left superior temporal lobe anteriorly stable.  Currently maintained on aspirin for CVA prophylaxis.  Currently n.p.o. with alternative means of nutritional support.  EEG suggestive of cortical dysfunction left hemisphere likely secondary to underlying infarct as well as mild to moderate encephalopathy no seizure activity.  Hospital course further complicated by bouts of urinary retention post void residual 900 mL.  Therapy evaluation completed and patient was admitted for a comprehensive  rehab program.  Daughter told me pt took 30 mg of melatonin and multiple tabs of ZZZquil to sleep every night- after they had her taken off Ambien.  Had foley placed 1-2 days ago per daughter due to urinary retention.  Also told doing vest therapy for chest PT- however mainly has upper airway sounds, so doesn't need q4 hours per resp therapy.   LBM yesterday- was incontinent.    Review of Systems  Unable to perform ROS: Acuity of condition   Past Medical History:  Diagnosis Date  . Arthritis   . Atrial fibrillation ([place])   . Cataract    BILATERAL  . DDD (degenerative disc disease), cervical   . Depression   . Diverticulosis   . Falls   . GERD (gastroesophageal reflux disease)   . Glaucoma   . Headache   . Hyperlipidemia   . Hyperlipidemia   . Hypertension   . Osteoporosis   . Peripheral neuropathy   . Spondylosis    lumbar  . Subdural hematoma [facility])    Past Surgical History:  Procedure Laterality Date  . APPENDECTOMY    . BACK SURGERY  2010  . BURR HOLE FOR SUBDURAL HEMATOMA     x 2 hematoma  . COLONOSCOPY    . IR CT HEAD LTD  09/04/2019  . IR PERCUTANEOUS ART THROMBECTOMY/INFUSION INTRACRANIAL INC DIAG ANGIO  09/02/2019      . IR PERCUTANEOUS ART THROMBECTOMY/INFUSION INTRACRANIAL INC DIAG ANGIO  09/04/2019  . KNEE SURGERY Bilateral   . RADIOLOGY WITH ANESTHESIA N/A 09/02/2019   Procedure: IR WITH ANESTHESIA;  Surgeon: Radiologist, Medication, MD;  Location: [facility];  Service: Radiology;  Laterality: N/A;  . VAGINAL HYSTERECTOMY     Family History  Problem Relation Age of Onset  .  Heart disease Father   . Ovarian cancer Mother        ?  Marland Kitchen Breast cancer Sister   . Colon cancer Neg Hx   . Diabetes Neg Hx   . Liver disease Neg Hx   . Kidney disease Neg Hx   . Dementia Neg Hx    Social History:  reports that she has quit smoking. She has never used smokeless tobacco. She reports current alcohol use of about 14.0 standard drinks of alcohol per week. She reports  that she does not use drugs. Allergies:  Allergies  Allergen Reactions  . Codeine Other (See Comments)    unknown   Medications Prior to Admission  Medication Sig Dispense Refill  . acetaminophen (TYLENOL) 650 MG CR tablet Take 650 mg by mouth 2 (two) times daily.     Marland Kitchen amLODipine (NORVASC) 10 MG tablet Take 10 mg by mouth daily.     . APPLE CIDER VINEGAR PO Take 1,200 mg by mouth daily.    . Ascorbic Acid (VITAMIN C) 1000 MG tablet Take 1,000 mg by mouth daily.    . Biotin 5000 MCG TABS Take 5,000 mcg by mouth daily.    . Cholecalciferol (VITAMIN D3 SUPER STRENGTH) 50 MCG (2000 UT) TABS Take 2,000 Units by mouth daily.    . clobetasol (TEMOVATE) 0.05 % external solution Apply 1 application topically daily as needed (scalp itching).    . diphenhydrAMINE HCl, Sleep, (ZZZQUIL PO) Take 3 capsules by mouth at bedtime.    . donepezil (ARICEPT) 10 MG tablet Take 10 mg by mouth at bedtime.    Marland Kitchen latanoprost (XALATAN) 0.005 % ophthalmic solution Place 1 drop into both eyes at bedtime.    Marland Kitchen loratadine (CLARITIN) 10 MG tablet Take 10 mg by mouth daily as needed for itching.     . losartan (COZAAR) 25 MG tablet Take 25 mg by mouth daily.    . Melatonin 10 MG TABS Take 30 mg by mouth at bedtime.     . meloxicam (MOBIC) 15 MG tablet Take 15 mg by mouth daily as needed for pain.     . metoprolol tartrate (LOPRESSOR) 50 MG tablet Take 50 mg by mouth 2 (two) times daily.     Marland Kitchen omeprazole (PRILOSEC) 40 MG capsule Take 40 mg by mouth daily.     Marland Kitchen OVER THE COUNTER MEDICATION Take 2 tablets by mouth daily. "Sugar Bear Hair"    . Psyllium (METAMUCIL PO) Take 1 Dose by mouth daily as needed (constipation).    . Sennosides (EX-LAX PO) Take 1 tablet by mouth daily as needed (constipation).    . sertraline (ZOLOFT) 50 MG tablet Take 50 mg by mouth daily.     . Simethicone (GAS-X PO) Take 1 tablet by mouth daily as needed (gas/bloating/flatulence).    . simvastatin (ZOCOR) 20 MG tablet Take 20 mg by mouth at  bedtime.     . timolol (TIMOPTIC) 0.5 % ophthalmic solution Place 1 drop into both eyes daily.    Marland Kitchen triamcinolone lotion (KENALOG) 0.1 % Apply 1 application topically daily as needed (itching).    . TURMERIC PO Take 200 mg by mouth daily.    . vitamin B-12 (CYANOCOBALAMIN) 1000 MCG tablet Take 3,000 mcg by mouth daily.      Drug Regimen Review Drug regimen was reviewed and remains appropriate with no significant issues identified  Home: Home Living Family/patient expects to be discharged to:: Private residence Living Arrangements: Alone Available Help at Discharge: Available PRN/intermittently  Type of Home: Independent living facility Home Access: Level entry Home Layout: One level Bathroom Shower/Tub: Multimedia programmer: Handicapped height Bathroom Accessibility: Yes Home Equipment: Shower seat, Grab bars - toilet, Grab bars - tub/shower, Cane - single point, Environmental consultant - 2 wheels   Functional History: Prior Function Level of Independence: Independent with assistive device(s), Needs assistance ADL's / Homemaking Assistance Needed: has meals delivered, but can do breakfast and coffee, also has cleaning help every other week Comments: independent living cottage; cane; had assistance with cleaning; drives; has had "alot of falls" per daughter - none in past 6 months; does have life alert but she doesn't use it"very non-compliant" per daughter  Functional Status:  Mobility: Bed Mobility Overal bed mobility: Needs Assistance Bed Mobility: Rolling, Sidelying to Sit, Sit to Supine Rolling: Mod assist Sidelying to sit: Mod assist, HOB elevated Sit to supine: Max assist Sit to sidelying: +2 for physical assistance, Max assist General bed mobility comments: Pt was able, with multimodal cues to roll to her right using R bed rail, assist in progressing her left leg over to the side of the bed and scoot, Mod assist overall to help progress trunk up to sitting and scoot the rest of  the way to EOB.  Max assist to return to supine due to assist at both trunk and legs.   Transfers Overall transfer level: Needs assistance Equipment used: 2 person hand held assist Transfers: Sit to/from Stand Sit to Stand: +2 physical assistance, Max assist General transfer comment: would try the stedy next session with second person assist.  Ambulation/Gait General Gait Details: pt unable    ADL: ADL Overall ADL's : Needs assistance/impaired Eating/Feeding: NPO Grooming: Moderate assistance Grooming Details (indicate cue type and reason): able to "brush teeth" with toothette after set up using L hand Upper Body Bathing: Moderate assistance, Sitting Lower Body Bathing: Maximal assistance, Sit to/from stand, Sitting/lateral leans Upper Body Dressing : Maximal assistance, Sitting Lower Body Dressing: Total assistance Toileting- Clothing Manipulation and Hygiene: Total assistance Functional mobility during ADLs: Maximal assistance, +2 for physical assistance  Cognition: Cognition Overall Cognitive Status: Impaired/Different from baseline Arousal/Alertness: Awake/alert Orientation Level: Oriented to person Attention: Sustained Sustained Attention: Appears intact (simple, functional tasks) Awareness: Impaired Awareness Impairment: Emergent impairment Cognition Arousal/Alertness: Awake/alert Behavior During Therapy: WFL for tasks assessed/performed Overall Cognitive Status: Impaired/Different from baseline Area of Impairment: Attention, Memory, Following commands, Safety/judgement, Awareness, Problem solving Orientation Level: Disoriented to, Place, Situation Current Attention Level: Sustained Memory: Decreased short-term memory Following Commands: Follows one step commands with increased time Safety/Judgement: Decreased awareness of deficits Awareness: Intellectual Problem Solving: Slow processing, Decreased initiation General Comments: Pt often saying "yes" without  consistent accuracy.  multimodal cues for understandinhg, when testing sensation, pt said "yes" throughout even when not touching, R inattention.  Difficult to assess due to: Impaired communication  Physical Exam: Blood pressure (!) 154/75, pulse 70, temperature 98.3 F (36.8 C), temperature source Oral, resp. rate (!) 21, height 5\' 5"  (1.651 m), weight 82.2 kg, SpO2 96 %. Physical Exam Vitals and nursing note reviewed.  Constitutional:      Comments: Pt laying supine in bed- daughter at bedside- snoring loudly most of visit - mouth breather, NAD  HENT:     Head: Normocephalic and atraumatic.     Comments: Nasogastric tube in place- Cortrak in place- mouth breathing Couldn't smile for me or stick out tongue, since so sleepy, however has deep R facial droop at rest    Right Ear: External ear  normal.     Left Ear: External ear normal.     Nose: Congestion and rhinorrhea present.     Mouth/Throat:     Mouth: Mucous membranes are dry.     Pharynx: Oropharynx is clear. No oropharyngeal exudate or posterior oropharyngeal erythema.  Eyes:     Comments: Tried to test EOMs- pt appears she's unable to look past midline to Right- but was asleep for most of interview- no nystagmus seen- was disjointed  Cardiovascular:     Comments: Irregular rhythm- rate controlled- ~ 70s per exam and monitor Pulmonary:     Comments: Lots of upper airway sounds since snoring/mouth breathing- good air movement B/L Abdominal:     Comments: Soft, NT, ND, (+)BS hypoactive-TFs via Cortrak  Genitourinary:    Comments: FOLEY IN PLACE- light amber urine Musculoskeletal:     Cervical back: Normal range of motion. No rigidity.     Comments: Pt asleep most of time- wouldn't wake for more than a few seconds- trace grip on R hand; otherwise 0/5 in RUE and 0/5 in RLE- tested biceps, triceps, WE, grip and finger bd; as well as HF, KE, KF, DF and PF LUE- at least 4+/5 in LUE- in grip, biceps and triceps- kept falling asleep     Skin:    Comments: IV L forearm- looks great  Neurological:     Comments: Patient is lethargic but arousable.  She will open her eyes on command.  Nonverbal.  She does provide some yes no head nods to simple questions.  She did squeeze my hand on verbal command but followed no other verbal commands. Followed no verbal commands or questions yes/no asked- nodded head once, but then no answer to was she in pain- very lethargic the entire time- got amantadine 20 minutes before examined. Cannot assess sensation - per pt confusion/lethargy  Psychiatric:     Comments: sedated     Results for orders placed or performed during the hospital encounter of 09/02/19 (from the past 48 hour(s))  Glucose, capillary     Status: Abnormal   Collection Time: 09/05/19  7:41 AM  Result Value Ref Range   Glucose-Capillary 151 (H) 70 - 99 mg/dL    Comment: Glucose reference range applies only to samples taken after fasting for at least 8 hours.  Glucose, capillary     Status: Abnormal   Collection Time: 09/05/19 12:04 PM  Result Value Ref Range   Glucose-Capillary 151 (H) 70 - 99 mg/dL    Comment: Glucose reference range applies only to samples taken after fasting for at least 8 hours.  Glucose, capillary     Status: Abnormal   Collection Time: 09/05/19  8:57 PM  Result Value Ref Range   Glucose-Capillary 128 (H) 70 - 99 mg/dL    Comment: Glucose reference range applies only to samples taken after fasting for at least 8 hours.  Glucose, capillary     Status: Abnormal   Collection Time: 09/06/19 12:12 AM  Result Value Ref Range   Glucose-Capillary 133 (H) 70 - 99 mg/dL    Comment: Glucose reference range applies only to samples taken after fasting for at least 8 hours.   Comment 1 Notify RN    Comment 2 Document in Chart   CBC     Status: None   Collection Time: 09/06/19  1:57 AM  Result Value Ref Range   WBC 8.7 4.0 - 10.5 K/uL   RBC 3.93 3.87 - 5.11 MIL/uL   Hemoglobin 12.7  12.0 - 15.0 g/dL   HCT  37.2 36 - 46 %   MCV 94.7 80.0 - 100.0 fL   MCH 32.3 26.0 - 34.0 pg   MCHC 34.1 30.0 - 36.0 g/dL   RDW 11.9 11.5 - 15.5 %   Platelets 210 150 - 400 K/uL   nRBC 0.0 0.0 - 0.2 %    Comment: Performed at [facility], [place] [address], [place], [place] [address]  Basic metabolic panel     Status: Abnormal   Collection Time: 09/06/19  1:57 AM  Result Value Ref Range   Sodium 131 (L) 135 - 145 mmol/L   Potassium 3.7 3.5 - 5.1 mmol/L   Chloride 96 (L) 98 - 111 mmol/L   CO2 26 22 - 32 mmol/L   Glucose, Bld 141 (H) 70 - 99 mg/dL    Comment: Glucose reference range applies only to samples taken after fasting for at least 8 hours.   BUN 19 8 - 23 mg/dL   Creatinine, Ser 0.64 0.44 - 1.00 mg/dL   Calcium 9.1 8.9 - 10.3 mg/dL   GFR calc non Af Amer >60 >60 mL/min   GFR calc Af Amer >60 >60 mL/min   Anion gap 9 5 - 15    Comment: Performed at [facility] [address] [place], [place] [address]  Glucose, capillary     Status: Abnormal   Collection Time: 09/06/19  4:19 AM  Result Value Ref Range   Glucose-Capillary 157 (H) 70 - 99 mg/dL    Comment: Glucose reference range applies only to samples taken after fasting for at least 8 hours.   Comment 1 Notify RN    Comment 2 Document in Chart   Glucose, capillary     Status: Abnormal   Collection Time: 09/06/19  7:54 AM  Result Value Ref Range   Glucose-Capillary 158 (H) 70 - 99 mg/dL    Comment: Glucose reference range applies only to samples taken after fasting for at least 8 hours.  Glucose, capillary     Status: Abnormal   Collection Time: 09/06/19 11:55 AM  Result Value Ref Range   Glucose-Capillary 167 (H) 70 - 99 mg/dL    Comment: Glucose reference range applies only to samples taken after fasting for at least 8 hours.  Urinalysis, Routine w reflex microscopic Urine, Catheterized     Status: Abnormal   Collection Time: 09/06/19 12:00 PM  Result Value Ref Range   Color, Urine YELLOW YELLOW   APPearance HAZY (A) CLEAR    Specific Gravity, Urine 1.010 1.005 - 1.030   pH 8.0 5.0 - 8.0   Glucose, UA NEGATIVE NEGATIVE mg/dL   Hgb urine dipstick SMALL (A) NEGATIVE   Bilirubin Urine NEGATIVE NEGATIVE   Ketones, ur NEGATIVE NEGATIVE mg/dL   Protein, ur NEGATIVE NEGATIVE mg/dL   Nitrite NEGATIVE NEGATIVE   Leukocytes,Ua LARGE (A) NEGATIVE   RBC / HPF 0-5 0 - 5 RBC/hpf   WBC, UA 21-50 0 - 5 WBC/hpf   Bacteria, UA RARE (A) NONE SEEN   Triple Phosphate Crystal PRESENT     Comment: Performed at [facility], [address] [place], [place] [address]  Glucose, capillary     Status: Abnormal   Collection Time: 09/06/19  4:35 PM  Result Value Ref Range   Glucose-Capillary 120 (H) 70 - 99 mg/dL    Comment: Glucose reference range applies only to samples taken after fasting for at least 8 hours.  Glucose, capillary     Status: Abnormal   Collection Time: 09/06/19  8:08 PM  Result Value Ref Range   Glucose-Capillary 137 (H) 70 - 99 mg/dL    Comment: Glucose reference range applies only to samples taken after fasting for at least 8 hours.   Comment 1 Notify RN    Comment 2 Document in Chart   Glucose, capillary     Status: Abnormal   Collection Time: 09/06/19 11:32 PM  Result Value Ref Range   Glucose-Capillary 130 (H) 70 - 99 mg/dL    Comment: Glucose reference range applies only to samples taken after fasting for at least 8 hours.   Comment 1 Notify RN    Comment 2 Document in Chart   CBC     Status: None   Collection Time: 09/07/19  2:15 AM  Result Value Ref Range   WBC 8.5 4.0 - 10.5 K/uL   RBC 3.95 3.87 - 5.11 MIL/uL   Hemoglobin 12.5 12.0 - 15.0 g/dL   HCT 37.3 36 - 46 %   MCV 94.4 80.0 - 100.0 fL   MCH 31.6 26.0 - 34.0 pg   MCHC 33.5 30.0 - 36.0 g/dL   RDW 11.8 11.5 - 15.5 %   Platelets 225 150 - 400 K/uL   nRBC 0.0 0.0 - 0.2 %    Comment: Performed at [facility], [place] [address] [place], [place] [address]  Basic metabolic panel     Status: Abnormal   Collection Time: 09/07/19  2:15  AM  Result Value Ref Range   Sodium 131 (L) 135 - 145 mmol/L   Potassium 4.0 3.5 - 5.1 mmol/L   Chloride 96 (L) 98 - 111 mmol/L   CO2 25 22 - 32 mmol/L   Glucose, Bld 142 (H) 70 - 99 mg/dL    Comment: Glucose reference range applies only to samples taken after fasting for at least 8 hours.   BUN 21 8 - 23 mg/dL   Creatinine, Ser 0.66 0.44 - 1.00 mg/dL   Calcium 9.0 8.9 - 10.3 mg/dL   GFR calc non Af Amer >60 >60 mL/min   GFR calc Af Amer >60 >60 mL/min   Anion gap 10 5 - 15    Comment: Performed at [facility] [address] [place], [place] [address]  Glucose, capillary     Status: Abnormal   Collection Time: 09/07/19  3:23 AM  Result Value Ref Range   Glucose-Capillary 130 (H) 70 - 99 mg/dL    Comment: Glucose reference range applies only to samples taken after fasting for at least 8 hours.   Comment 1 Notify RN    Comment 2 Document in Chart    CT HEAD WO CONTRAST  Result Date: 09/06/2019 CLINICAL DATA:  Stroke post left MCA thrombectomy and recanalization 09/04/2019. Follow-up intracranial hemorrhage. EXAM: CT HEAD WITHOUT CONTRAST TECHNIQUE: Contiguous axial images were obtained from the base of the skull through the vertex without intravenous contrast. COMPARISON:  CT head 09/05/2019 FINDINGS: Brain: Small amount of hemorrhage in the left putamen with associated surrounding low-density infarct. Low density in the left putamen compatible with acute infarct also unchanged. Small amount of hemorrhage in the left sylvian fissure similar to the prior study. Hemorrhage in the left superior anterior temporal lobe unchanged. No new areas of hemorrhage Moderate atrophy. Chronic microvascular ischemic changes in the white matter. Negative for hydrocephalus Vascular: Negative for hyperdense vessel Skull: 2 burr holes in the right parietal  bone. No acute skeletal abnormality Sinuses/Orbits: Chronic depressed fracture left orbital floor unchanged. Bilateral proptosis without orbital mass.  Mild mucosal edema paranasal sinuses. Air-fluid level sphenoid sinus. Other: None IMPRESSION: Stable CT without evidence of new hemorrhage Hemorrhagic infarction left putamen stable. Small amount of hemorrhage in the left sylvian fissure and left superior temporal lobe anteriorly stable. Electronically Signed   By: Franchot Gallo M.D.   On: 09/06/2019 13:59   DG CHEST PORT 1 VIEW  Result Date: 09/06/2019 CLINICAL DATA:  Respiratory failure.  Lethargy. EXAM: PORTABLE CHEST 1 VIEW COMPARISON:  01/14/2008 FINDINGS: The heart is within normal limits in size given the AP projection and portable technique. Moderate tortuosity and calcification of the thoracic aorta. Pulmonary hila are slightly prominent but probably normal for age and degree of inspiration. Low lung volumes with vascular crowding and streaky atelectasis. No infiltrates, edema, effusions or pneumothorax. The bony thorax is intact. IMPRESSION: 1. Low lung volumes with vascular crowding and streaky atelectasis. 2. No infiltrates, edema or effusions. Electronically Signed   By: Marijo Sanes M.D.   On: 09/06/2019 13:25   EEG adult  Result Date: 09/06/2019 Lora Havens, MD     09/06/2019  3:46 PM Patient Name: Alajiah G Jaros MRN: [id number] Epilepsy Attending: Lora Havens Referring Physician/Provider: Burnetta Sabin, NP Date: 09/06/2019 Duration: 21.30 mins Patient history: 86 year old female who presented with speech disturbance, left-sided gaze deviation and nonrhythmic shaking of left side.  CT showed left MCA acute infarct.  EEG to assess for seizures. Level of alertness: Awake AEDs during EEG study: None Technical aspects: This EEG study was done with scalp electrodes positioned according to the 10-20 International system of electrode placement. Electrical activity was acquired at a sampling rate of 500Hz  and reviewed with a high frequency filter of 70Hz  and a low frequency filter of 1Hz . EEG data were recorded continuously and digitally  stored. Description: The posterior dominant rhythm consists of 8 Hz activity of moderate voltage (25-35 uV) seen predominantly in posterior head regions, symmetric and reactive to eye opening and eye closing. EEG showed continuous generalized and lateralized left hemisphere 3 to 6 Hz theta-delta slowing with overriding 13-15hz  generalized beta activity. Hyperventilation and photic stimulation were not performed.   ABNORMALITY -Continuous slow, generalized and lateralized left hemisphere IMPRESSION: This study is suggestive of cortical dysfunction in left hemisphere likely secondary to underlying infarct as well as mild to moderate diffuse encephalopathy, nonspecific etiology. No seizures or definite epileptiform discharges were seen throughout the recording. Lora Havens       Medical Problem List and Plan: 1.  Right side weakness and aphasia secondary to left MCA territory infarction status post IR revascularization with SAH  -patient may  Shower if has good enough sitting balance  -ELOS/Goals: 3-4 weeks- goals min A hopefully 2.  Antithrombotics: -DVT/anticoagulation: SCDs- had petechial hemrrhages  -antiplatelet therapy: Aspirin 81 mg daily 3. Pain Management: Tylenol as needed 4. Mood: Amantadine 100 mg daily, Zoloft 50 mg daily, Aricept 10 mg nightly- just changed amantadine to liquid- first dose 8/19 in PM- might benefit from Ritalin to wake her up  -antipsychotic agents: N/A 5. Neuropsych: This patient is NOT capable of making decisions on her own behalf. 6. Skin/Wound Care: Routine skin checks 7. Fluids/Electrolytes/Nutrition: Routine in and outs with follow-up chemistries 8.  Dysphagia.  Tube feeds as directed.  Dietary follow-up.  Speech therapy follow-up- has Cortrak- assess if things improves, or if needs PEG long term 9.  Hypertension.  Lopressor 50  mg twice daily, Cozaar 25 mg daily, Norvasc 10 mg daily.  Monitor with increased mobility 10.  Hyperlipidemia.  Lipitor 11.   History of alcohol use.  Provide counseling with patient and family 66.  Atrial fibrillation.  No anticoagulation at this time.  Cardiac rate controlled.  Continue beta-blocker 13. Urinary retention- has foley since significant retention- cannot take flomax with Cortrak.  14. Sleep-/insomnia- pt took 30, not 3 mg of Melatonin nightly at home AND 3+ pills of ZZZquil at home to sleep nightly per daughter- Tasia Catchings 09/07/2019    I have personally performed a face to face diagnostic evaluation of this patient and formulated the key components of the plan.  Additionally, I have personally reviewed laboratory data, imaging studies, as well as relevant notes and concur with the physician assistant's documentation above.   The patient's status has not changed from the original H&P.  Any changes in documentation from the acute care chart have been noted above.

## 2019-09-07 NOTE — Progress Notes (Signed)
  Speech Language Pathology Treatment: Dysphagia  Patient Details Name: [name] MRN: [id number] DOB: 1932-03-28 Today's Date: 09/07/2019 Time: 6812-7517 SLP Time Calculation (min) (ACUTE ONLY): 47 min  Assessment / Plan / Recommendation Clinical Impression  Pt assisted with oral care using hand over hand assist, initial use of oral suction with total assist and eventually and used oral suction with visual/verbal cue x2.  Pt did not swallow or cough on command however and became sleepy during the session resulting in decreased participation.  SLP did not provided ice to pt due to her lethargy and congested breathing.  RN to provide medication to help loosen secretions.  Advised pt's daughter to difference in reflexive vs volitional cough.  SLP  She reports pt has coughed several times today l= advised was likely due to aspiration of secretions.  Educated daughter to importance of oral care/HOB lowering limitation due to suspected secretion aspiration.    Functional communication exchange included inquiring if pt wanted ice to which she nodded yes= with min verbal cues pt repeated ice x2. When given verbal choice of two, pt tends to repeat last thing SLP states.  Eg: SLP asked "Does your mouth feel better or worse?"  Pt verbalized "worse" x2 and nodded "yes" when indicated if this was correct.  Her daughter present states pt nods "yes" to many questions.  She continues with significant aphasia and dysarthria and will benefit from SLP to maximize receptive and expressive language skills for basic communication.  Requested to daughter to have pt repeat word/phrase if she knows what pt desires.    HPI HPI: [name] with past medical history of A. fib not on anticoagulation due to frequent falling and prior subdural hematoma, hypertension, memory loss, GERD who was admitted to the hospital for altered mental status, found to have a left MCA stroke and is now status post thrombectomy.  Extubated 8/16.       SLP Plan  Continue with current plan of care       Recommendations  Diet recommendations: NPO Medication Administration: Via alternative means                Oral Care Recommendations: Oral care QID Follow up Recommendations: Inpatient Rehab SLP Visit Diagnosis: Aphasia (R47.01);Dysphagia, oropharyngeal phase (R13.12) Plan: Continue with current plan of care       [place], Gilverto Dileonardo Ann 09/07/2019, 12:31 PM   Kathleen Lime, MS [place] Office [phone]

## 2019-09-07 NOTE — Care Management Important Message (Signed)
Important Message  Patient Details  Name: [name] MRN: [id number] Date of Birth: September 25, 1932   Medicare Important Message Given:  Yes  Tried to call the patient in room to remind her of Medicare Rights  No answer will mail to patient address.   Mandee Pluta 09/07/2019, 3:37 PM

## 2019-09-07 NOTE — Discharge Summary (Addendum)
Stroke Discharge Summary  Patient ID: Jerlisa desirea mizrahi   MRN: [id number]      DOB: 1932-11-04  Date of Admission: 09/02/2019 Date of Discharge: 09/07/2019  Attending Physician:  Garvin Fila, MD, Stroke MD Consultant(s):  Pedro Earls MD (Interventional Neuroradiologist) , Gaylan Gerold, MD ( pulmonary/intensive care ), Delice Lesch, MD (Physical Medicine & Rehabtilitation)  Patient's PCP:  [facility]  Discharge Diagnoses:  Principal Problem:   Acute ischemic right MCA stroke [facility]) s/p mechanical thrombectomy d/t AF not on [facility] Active Problems:   Atrial fibrillation ([place])   HTN (hypertension)   Acute respiratory failure ([place])   Dyslipidemia, goal LDL below 70   Lethargic   Alcohol abuse   Hyponatremia   Obesity   Urinary retention   Medications to be continued on Rehab Allergies as of 09/07/2019      Reactions   Codeine Other (See Comments)   unknown      Medication List    STOP taking these medications   acetaminophen 650 MG CR tablet Commonly known as: TYLENOL Replaced by: acetaminophen 160 MG/5ML solution   APPLE CIDER VINEGAR PO   Biotin 5000 MCG Tabs   clobetasol 0.05 % external solution Commonly known as: TEMOVATE   EX-LAX PO   GAS-X PO   loratadine 10 MG tablet Commonly known as: CLARITIN   Melatonin 10 MG Tabs   meloxicam 15 MG tablet Commonly known as: MOBIC   METAMUCIL PO   metoprolol tartrate 50 MG tablet Commonly known as: LOPRESSOR Replaced by: metoprolol tartrate 25 mg/10 mL Susp   omeprazole 40 MG capsule Commonly known as: PRILOSEC   OVER THE COUNTER MEDICATION   simvastatin 20 MG tablet Commonly known as: ZOCOR   TURMERIC PO   vitamin C 1000 MG tablet   Vitamin D3 Super Strength 50 MCG (2000 UT) Tabs Generic drug: Cholecalciferol   ZZZQUIL PO     TAKE these medications   acetaminophen 160 MG/5ML solution Commonly known as: TYLENOL Place 20.3 mLs (650 mg total) into feeding tube every 4  (four) hours as needed for mild pain (or temp > 37.5 C (99.5 F)). Replaces: acetaminophen 650 MG CR tablet   amantadine 50 MG/5ML solution Commonly known as: SYMMETREL Place 10 mLs (100 mg total) into feeding tube daily. Start taking on: September 08, 2019   amLODipine 10 MG tablet Commonly known as: NORVASC Place 1 tablet (10 mg total) into feeding tube daily. Start taking on: September 08, 2019 What changed: how to take this   aspirin 81 MG chewable tablet Place 1 tablet (81 mg total) into feeding tube daily. Start taking on: September 08, 2019   atorvastatin 40 MG tablet Commonly known as: LIPITOR Place 1 tablet (40 mg total) into feeding tube daily. Start taking on: September 08, 2019   chlorhexidine gluconate (MEDLINE KIT) 0.12 % solution Commonly known as: PERIDEX 15 mLs by Mouth Rinse route 2 (two) times daily.   cyanocobalamin 1000 MCG tablet Place 3 tablets (3,000 mcg total) into feeding tube daily. Start taking on: September 08, 2019 What changed: how to take this   donepezil 10 MG tablet Commonly known as: ARICEPT Place 1 tablet (10 mg total) into feeding tube at bedtime. What changed: how to take this   feeding supplement (OSMOLITE 1.2 CAL) Liqd Place 1,000 mLs into feeding tube continuous.   feeding supplement (PROSource TF) liquid Place 45 mLs into feeding tube 2 (two) times daily.   glycopyrrolate 0.2  MG/ML injection Commonly known as: ROBINUL Inject 0.5 mLs (0.1 mg total) into the vein every 4 (four) hours as needed (for excessive secretions).   latanoprost 0.005 % ophthalmic solution Commonly known as: XALATAN Place 1 drop into both eyes at bedtime.   losartan 25 MG tablet Commonly known as: COZAAR Place 1 tablet (25 mg total) into feeding tube daily. Start taking on: September 08, 2019 What changed: how to take this   metoprolol tartrate 25 mg/10 mL Susp Commonly known as: LOPRESSOR Place 20 mLs (50 mg total) into feeding tube 2 (two) times daily. Replaces:  metoprolol tartrate 50 MG tablet   multivitamin Liqd Place 15 mLs into feeding tube daily. Start taking on: September 08, 2019   pantoprazole sodium 40 mg/20 mL Pack Commonly known as: PROTONIX Place 20 mLs (40 mg total) into feeding tube at bedtime.   senna-docusate 8.6-50 MG tablet Commonly known as: Senokot-S Take 1 tablet by mouth at bedtime as needed for mild constipation.   sertraline 50 MG tablet Commonly known as: ZOLOFT Place 1 tablet (50 mg total) into feeding tube daily. Start taking on: September 08, 2019 What changed: how to take this   sodium chloride 0.9 % infusion Inject 50 mLs into the vein continuous.   timolol 0.5 % ophthalmic solution Commonly known as: TIMOPTIC Place 1 drop into both eyes daily.   triamcinolone lotion 0.1 % Commonly known as: KENALOG Apply 1 application topically daily as needed (itching).            Discharge Care Instructions  (From admission, onward)         Start     Ordered   09/07/19 0000  Discharge wound care:       Comments: As per AVS   09/07/19 1354          LABORATORY STUDIES CBC    Component Value Date/Time   WBC 8.5 09/07/2019 0215   RBC 3.95 09/07/2019 0215   HGB 12.5 09/07/2019 0215   HCT 37.3 09/07/2019 0215   PLT 225 09/07/2019 0215   MCV 94.4 09/07/2019 0215   MCH 31.6 09/07/2019 0215   MCHC 33.5 09/07/2019 0215   RDW 11.8 09/07/2019 0215   LYMPHSABS 1.2 09/02/2019 1130   MONOABS 0.4 09/02/2019 1130   EOSABS 0.0 09/02/2019 1130   BASOSABS 0.0 09/02/2019 1130   CMP    Component Value Date/Time   NA 131 (L) 09/07/2019 0215   K 4.0 09/07/2019 0215   CL 96 (L) 09/07/2019 0215   CO2 25 09/07/2019 0215   GLUCOSE 142 (H) 09/07/2019 0215   BUN 21 09/07/2019 0215   CREATININE 0.66 09/07/2019 0215   CREATININE 1.05 (H) 11/01/2014 1205   CALCIUM 9.0 09/07/2019 0215   PROT 7.2 09/02/2019 1130   ALBUMIN 4.2 09/02/2019 1130   AST 25 09/02/2019 1130   ALT 15 09/02/2019 1130   ALKPHOS 63 09/02/2019  1130   BILITOT 1.0 09/02/2019 1130   GFRNONAA >60 09/07/2019 0215   GFRAA >60 09/07/2019 0215   COAGS Lab Results  Component Value Date   INR 1.0 09/02/2019   INR 1.0 06/08/2008   INR 0.9 01/14/2008   Lipid Panel    Component Value Date/Time   CHOL 206 (H) 09/03/2019 0319   TRIG 230 (H) 09/03/2019 0319   HDL 41 09/03/2019 0319   CHOLHDL 5.0 09/03/2019 0319   VLDL 46 (H) 09/03/2019 0319   LDLCALC 119 (H) 09/03/2019 0319   HgbA1C  Lab Results  Component Value  Date   HGBA1C 5.8 (H) 09/03/2019   Urinalysis    Component Value Date/Time   COLORURINE YELLOW 09/06/2019 1200   APPEARANCEUR HAZY (A) 09/06/2019 1200   LABSPEC 1.010 09/06/2019 1200   PHURINE 8.0 09/06/2019 1200   GLUCOSEU NEGATIVE 09/06/2019 1200   HGBUR SMALL (A) 09/06/2019 1200   BILIRUBINUR NEGATIVE 09/06/2019 1200   KETONESUR NEGATIVE 09/06/2019 1200   PROTEINUR NEGATIVE 09/06/2019 1200   UROBILINOGEN 0.2 06/08/2008 1024   NITRITE NEGATIVE 09/06/2019 1200   LEUKOCYTESUR LARGE (A) 09/06/2019 1200    SIGNIFICANT DIAGNOSTIC STUDIES CT HEAD WO CONTRAST  Result Date: 09/06/2019 CLINICAL DATA:  Stroke post left MCA thrombectomy and recanalization 09/04/2019. Follow-up intracranial hemorrhage. EXAM: CT HEAD WITHOUT CONTRAST TECHNIQUE: Contiguous axial images were obtained from the base of the skull through the vertex without intravenous contrast. COMPARISON:  CT head 09/05/2019 FINDINGS: Brain: Small amount of hemorrhage in the left putamen with associated surrounding low-density infarct. Low density in the left putamen compatible with acute infarct also unchanged. Small amount of hemorrhage in the left sylvian fissure similar to the prior study. Hemorrhage in the left superior anterior temporal lobe unchanged. No new areas of hemorrhage Moderate atrophy. Chronic microvascular ischemic changes in the white matter. Negative for hydrocephalus Vascular: Negative for hyperdense vessel Skull: 2 burr holes in the right  parietal bone. No acute skeletal abnormality Sinuses/Orbits: Chronic depressed fracture left orbital floor unchanged. Bilateral proptosis without orbital mass. Mild mucosal edema paranasal sinuses. Air-fluid level sphenoid sinus. Other: None IMPRESSION: Stable CT without evidence of new hemorrhage Hemorrhagic infarction left putamen stable. Small amount of hemorrhage in the left sylvian fissure and left superior temporal lobe anteriorly stable. Electronically Signed   By: Franchot Gallo M.D.   On: 09/06/2019 13:59   CT Head Wo Contrast  Result Date: 09/05/2019 CLINICAL DATA:  Stroke follow-up.  Subdural hematoma history. EXAM: CT HEAD WITHOUT CONTRAST TECHNIQUE: Contiguous axial images were obtained from the base of the skull through the vertex without intravenous contrast. COMPARISON:  Brain MRI from 2 days ago FINDINGS: Brain: Essentially stable subarachnoid blood along the lower left sylvian fissure and adjacent sulci. Hazy hemorrhage at the left putamen is also non progressed. Cytotoxic edema is progressively apparent at the left basal ganglia and adjacent white matter tracks, expected. No interval hemorrhage, hydrocephalus, or masslike finding. Brain atrophy. Vascular: Streak artifact with no detected hyperdense vessel. Skull: Remote burr holes on the right.  No acute finding Sinuses/Orbits: Negative IMPRESSION: Left basal ganglia and inferior division infarcts without detected progression. Unchanged petechial hemorrhage at the basal ganglia and subarachnoid hemorrhage along the sylvian fissure. Electronically Signed   By: Monte Fantasia M.D.   On: 09/05/2019 07:35   CT HEAD WO CONTRAST  Result Date: 09/03/2019 CLINICAL DATA:  Left MCA stroke post thrombectomy EXAM: CT HEAD WITHOUT CONTRAST TECHNIQUE: Contiguous axial images were obtained from the base of the skull through the vertex without intravenous contrast. COMPARISON:  09/02/2019 FINDINGS: Brain: There is new hyperdensity of the left basal  ganglia. There is also new hyperdensity about the left MCA and left sylvian fissure. No new loss of gray-white differentiation. Ventricles are stable in size. Stable findings of advanced chronic microvascular ischemic changes. No extra-axial fluid collection. No significant mass effect. Vascular: Left MCA is not well evaluated but does not appear as dense. Skull: Right burr holes.  Otherwise unremarkable. Sinuses/Orbits: Nonspecific paranasal sinus mucosal thickening. No acute orbital finding. Other: None. IMPRESSION: New hyperdensity of the previously hypodense left basal ganglia may  reflect petechial reperfusion hemorrhage or contrast staining. Hyperdensity along the left MCA and sylvian fissure likely reflects small volume subarachnoid hemorrhage. Electronically Signed   By: Macy Mis M.D.   On: 09/03/2019 09:31   MR BRAIN WO CONTRAST  Result Date: 09/03/2019 CLINICAL DATA:  Left MCA stroke post thrombectomy EXAM: MRI HEAD WITHOUT CONTRAST TECHNIQUE: Multiplanar, multiecho pulse sequences of the brain and surrounding structures were obtained without intravenous contrast. COMPARISON:  Recent CT imaging, MRI brain 2018 FINDINGS: Brain: There is reduced diffusion involving the left basal ganglia and adjacent white matter, a small area within the anteromedial left temporal lobe, and small areas of the parietal and posterior left temporal lobes. There is corresponding susceptibility primarily in the basal ganglia region reflecting petechial reperfusion hemorrhage. Diffusion hyperintensity and susceptibility along the left MCA cistern and sylvian fissure are compatible with subarachnoid hemorrhage. There is trace layering intraventricular hemorrhage in the occipital horns likely reflecting redistribution of the subarachnoid hemorrhage. Prominence of the ventricles and sulci reflects generalized parenchymal volume loss. Confluent areas of T2 hyperintensity in the supratentorial greater than pontine white matter  are nonspecific but probably reflect advanced chronic microvascular ischemic changes. There is no significant mass effect. Vascular: Major vessel flow voids at the skull base are preserved. Skull and upper cervical spine: Normal marrow signal is preserved. Sinuses/Orbits: Mild mucosal thickening. Bilateral lens replacements. Other: Sella is unremarkable.  Mastoid air cells are clear. IMPRESSION: Acute infarcts of the left MCA territory involving the basal ganglia and adjacent white matter and parietotemporal lobes. Petechial reperfusion hemorrhage primarily involving the basal ganglia and small volume subarachnoid hemorrhage as seen on CT. Advanced chronic microvascular ischemic changes. Electronically Signed   By: Macy Mis M.D.   On: 09/03/2019 14:35   IR CT Head Ltd  Result Date: 09/04/2019 INDICATION: 86 year old female with past medical history significant for atrial fibrillation (not on anticoagulation due to prior subdural hematoma), hypertension, hyperlipidemia, heart disease, mild cognitive impairment of vascular etiology complicated by alcohol use with a premorbid Rankin scale of 0. She presented to ED with aphasia, left gaze deviation and nonrhythmic shaking of the left side, NIHSS 22. She was last known well at 2:30 p.m. on 09/01/2019. No IV tPA administered as patient was outside of the window. Head CT showed prominent white matter disease and atrophy with hyperdense left MCA without large acute infarct or hemorrhage. CT angiogram of the head and neck showed a proximal left M1/MCA occlusion extending into the M2 segment. CT perfusion showed a core infarct of 4 mL with a penumbra of 83 mL. She was then transferred to our service for a diagnostic cerebral angiogram and emergency mechanical thrombectomy. EXAM: DIAGNOSTIC CEREBRAL ANGIOGRAM MECHANICAL THROMBECTOMY FLAT PANEL HEAD CT COMPARISON:  CT/CT angiogram of the head and neck September 02, 2019 MEDICATIONS: No antibiotics given.  ANESTHESIA/SEDATION: The procedure was performed in the general anesthesia. FLUOROSCOPY TIME:  Fluoroscopy Time: 31 minutes 12 seconds (678 mGy). COMPLICATIONS: None immediate. TECHNIQUE: Informed written consent was obtained from the patient's daughter after a thorough discussion of the procedural risks, benefits and alternatives. All questions were addressed. Maximal Sterile Barrier Technique was utilized including caps, mask, sterile gowns, sterile gloves, sterile drape, hand hygiene and skin antiseptic. A timeout was performed prior to the initiation of the procedure. The right groin was prepped and draped in the usual sterile fashion. Using a micropuncture kit and the modified Seldinger technique, access was gained to the right common femoral artery and an 8 French sheath was placed. Under  fluoroscopy, an 8 [place] Walrus balloon guide catheter was navigated over a 6 [place] VTK catheter and a 0.035" Terumo Glidewire into the aortic arch. The catheter was placed into the left common carotid artery and then advanced into the left internal carotid artery. The inner catheter was removed. Frontal and lateral angiograms of the head were obtained. FINDINGS: Occlusion of the left middle cerebral artery at its origin. Fair collateral flow is seen from the left ACA to the left MCA vascular tree. PROCEDURE: Under biplane roadmap, a zoom 71 aspiration catheter was navigated over a phenom 21 microcatheter and a synchro support microguidewire into the cavernous segment of the right ICA. The microcatheter was then navigated over the wire into the left M2/MCA inferior division branch. Then, a 5 x 37 mm embotrap stent retriever was deployed spanning the M1 and M2 segments. The device was allowed to intercalated with the clot for 4 minutes. The microcatheter was removed. The aspiration catheter was advanced to the level of occlusion and connected to a penumbra aspiration pump. The guiding catheter balloon was inflated. The  thrombectomy device and aspiration catheter were removed under constant aspiration. Follow-up left ICA angiogram showed recanalization of most of the M1 segment with occlusion at the level of the bifurcation. Under biplane roadmap, a zoom 71 aspiration catheter was navigated over a phenom 21 microcatheter and a synchro support microguidewire into the cavernous segment of the right ICA. The microcatheter was then navigated over the wire into the left M2/MCA superior division branch. Then, a 5 x 37 mm embotrap stent retriever was deployed spanning the M1 and M2 segments. The device was allowed to intercalated with the clot for 4 minutes. The microcatheter was removed. The aspiration catheter was advanced to the level of occlusion and connected to a penumbra aspiration pump. The guiding catheter balloon was inflated. The thrombectomy device and aspiration catheter were removed under constant aspiration. Follow-up left ICA angiogram showed recanalization of the M1 segment and superior division with persistent occlusion of the inferior division. Under biplane roadmap, a zoom 71 aspiration catheter was navigated over a phenom 21 microcatheter and a synchro support microguidewire into the cavernous segment of the right ICA. The microcatheter was then navigated over the wire into the left M2/MCA inferior division branch. Then, a 5 x 37 mm embotrap stent retriever was deployed spanning the M1 and M2 segments. The device was allowed to intercalated with the clot for 4 minutes. The microcatheter was removed. The aspiration catheter was advanced to the level of occlusion and connected to a penumbra aspiration pump. The guiding catheter balloon was inflated. The thrombectomy device and aspiration catheter were removed under constant aspiration. Follow-up left ICA angiogram showed complete recanalization of the left MCA vascular tree (TICI3). Flat panel CT of the head was obtained and post processed in a separate workstation with  concurrent attending physician supervision. Selected images were sent to PACS. Contrast staining in the left basal ganglia was noted. Small subarachnoid hemorrhage versus contrast extravasation in the left sylvian fissure. Delayed follow-up angiogram showed persistent patency of the left MCA vascular tree with no evidence of embolus to new or distal territory. The catheter was subsequently withdrawn. Right femoral artery angiograms were obtained with right anterior oblique and lateral views. The puncture is at the level of the common femoral artery. The femoral sheath was exchanged over the wire for a Perclose ProGlide which was utilized for access closure. Immediate hemostasis was achieved. IMPRESSION: 1. Successful mechanical thrombectomy for treatment of a left  M1/MCA proximal occlusion. Three passes performed with combined technique (aspiration left stent retriever) with complete recanalization. 2. No embolus to new or distal territory. 3. Small left sylvian subarachnoid hemorrhage versus process extravasation. PLAN: - Transfer to ICU- Maintain intubated as no covid-19 test result available- SBP 120-140 mmHg- Bed rest post femoral access x6 hours Electronically Signed   By: Pedro Earls M.D.   On: 09/04/2019 13:09   CT CEREBRAL PERFUSION W CONTRAST  Result Date: 09/02/2019 CLINICAL DATA:  Code stroke EXAM: CT HEAD WITHOUT CONTRAST CT ANGIOGRAPHY HEAD AND NECK CT PERFUSION BRAIN TECHNIQUE: Multidetector CT imaging of the head and neck was performed using the standard protocol during bolus administration of intravenous contrast. Multiplanar CT image reconstructions and MIPs were obtained to evaluate the vascular anatomy. Carotid stenosis measurements (when applicable) are obtained utilizing NASCET criteria, using the distal internal carotid diameter as the denominator. Multiphase CT imaging of the brain was performed following IV bolus contrast injection. Subsequent parametric perfusion maps  were calculated using RAPID software. CONTRAST:  187m OMNIPAQUE IOHEXOL 350 MG/ML SOLN COMPARISON:  2020 head CT FINDINGS: Brain: There is no acute intracranial hemorrhage. Suspected new hypoattenuation of the body of caudate nucleus and posterior lentiform nucleus on the left. Prominence of the ventricles and sulci reflects generalized parenchymal volume. Confluent areas of hypoattenuation in the supratentorial white matter nonspecific but probably reflect advanced chronic microvascular ischemic changes. There is no extra-axial fluid collection. Vascular: Hyperdensity of the left MCA. Skull: Small right burr holes. Sinuses/Orbits: No acute finding. Other: Mastoid air cells are clear. ASPECTS (ADixieStroke Program Early CT Score) - Ganglionic level infarction (caudate, lentiform nuclei, internal capsule, insula, M1-M3 cortex): 5 - Supraganglionic infarction (M4-M6 cortex): 3 Total score (0-10 with 10 being normal): 8 CTA NECK FINDINGS Aortic arch: Calcified and noncalcified plaque along the arch and patent great vessel origins. Right carotid system: Patent. Mild calcified plaque at the ICA origin causing minimal stenosis. Left carotid system: Patent. Trace calcified plaque at the ICA origin without measurable stenosis. Vertebral arteries: Patent. Left vertebral artery is slightly dominant. Minor calcified plaque is present. Skeleton: Multilevel degenerative changes of the cervical spine. Other neck: No mass or adenopathy. Upper chest: No apical lung mass. Review of the MIP images confirms the above findings CTA HEAD FINDINGS Anterior circulation: Intracranial internal carotid arteries are patent with trace calcified plaque. There is nearly occlusive thrombus within the proximal left M1 MCA with faint contrast enhancement more distally extending into a M2 branch. Diminished flow within distal left MCA branches. Right middle cerebral and both anterior cerebral arteries are patent. Posterior circulation:  Intracranial vertebral, basilar, and posterior cerebral arteries are patent. There are bilateral posterior communicating arteries present. Venous sinuses: As permitted by contrast timing, patent. Review of the MIP images confirms the above findings CT Brain Perfusion Findings: CBF (<30%) Volume: 418mPerfusion (Tmax>6.0s) volume: 8745mismatch Volume: 36m66mfarction Location: Left basal ganglia corresponding to noncontrast CT findings. Area of penumbra is within the left MCA territory. IMPRESSION: No acute intracranial hemorrhage. 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 ASPECT score of 8. There is nearly occlusive thrombus within the proximal left M1 MCA extending into an M2 branch. Perfusion imaging demonstrates core infarction of 4 mL (corresponds to noncontrast CT finding) and penumbra of 83 mL in the left MCA territory. Initial results were called by telephone at the time of interpretation on 09/02/2019 at 11:43 am to provider Dr. BhagCurly Shoreso verbally acknowledged these results. CTA/CTP results were  communicated at 11:55 amon 8/14/2021by text page via the [facility] messaging system. Electronically Signed   By: Macy Mis M.D.   On: 09/02/2019 12:05   DG CHEST PORT 1 VIEW  Result Date: 09/06/2019 CLINICAL DATA:  Respiratory failure.  Lethargy. EXAM: PORTABLE CHEST 1 VIEW COMPARISON:  01/14/2008 FINDINGS: The heart is within normal limits in size given the AP projection and portable technique. Moderate tortuosity and calcification of the thoracic aorta. Pulmonary hila are slightly prominent but probably normal for age and degree of inspiration. Low lung volumes with vascular crowding and streaky atelectasis. No infiltrates, edema, effusions or pneumothorax. The bony thorax is intact. IMPRESSION: 1. Low lung volumes with vascular crowding and streaky atelectasis. 2. No infiltrates, edema or effusions. Electronically Signed   By: Marijo Sanes M.D.   On: 09/06/2019 13:25   DG Abd  Portable 1V  Result Date: 09/03/2019 CLINICAL DATA:  Evaluate enteric tube. EXAM: PORTABLE ABDOMEN - 1 VIEW COMPARISON:  None. FINDINGS: Enteric tube tip and side-port project over the stomach. Unremarkable bowel gas pattern. Lumbar spinal hardware. IMPRESSION: Enteric tube tip and side-port project over the stomach. Electronically Signed   By: Lovey Newcomer M.D.   On: 09/03/2019 13:26   EEG adult  Result Date: 09/06/2019 Lora Havens, MD     09/06/2019  3:46 PM Patient Name: Navjot G Geoghegan MRN: [id number] Epilepsy Attending: Lora Havens Referring Physician/Provider: Burnetta Sabin, NP Date: 09/06/2019 Duration: 21.30 mins Patient history: 86 year old female who presented with speech disturbance, left-sided gaze deviation and nonrhythmic shaking of left side.  CT showed left MCA acute infarct.  EEG to assess for seizures. Level of alertness: Awake AEDs during EEG study: None Technical aspects: This EEG study was done with scalp electrodes positioned according to the 10-20 International system of electrode placement. Electrical activity was acquired at a sampling rate of 500Hz and reviewed with a high frequency filter of 70Hz and a low frequency filter of 1Hz. EEG data were recorded continuously and digitally stored. Description: The posterior dominant rhythm consists of 8 Hz activity of moderate voltage (25-35 uV) seen predominantly in posterior head regions, symmetric and reactive to eye opening and eye closing. EEG showed continuous generalized and lateralized left hemisphere 3 to 6 Hz theta-delta slowing with overriding 13-15hz generalized beta activity. Hyperventilation and photic stimulation were not performed.   ABNORMALITY -Continuous slow, generalized and lateralized left hemisphere IMPRESSION: This study is suggestive of cortical dysfunction in left hemisphere likely secondary to underlying infarct as well as mild to moderate diffuse encephalopathy, nonspecific etiology. No seizures or definite  epileptiform discharges were seen throughout the recording. Lora Havens   ECHOCARDIOGRAM COMPLETE  Result Date: 09/03/2019    ECHOCARDIOGRAM REPORT   Patient Name:   VANITA CANNELL Date of Exam: 09/03/2019 Medical Rec #:  [id number]      Height:       65.0 in Accession #:    [id number]     Weight:       185.8 lb Date of Birth:  1932-06-03     BSA:          1.917 m Patient Age:    62 years       BP:           142/84 mmHg Patient Gender: F              HR:           79 bpm. Exam Location:  Inpatient Procedure: 2D Echo Indications:  434.91 stroke  History:        Patient has prior history of Echocardiogram examinations.                 Arrythmias:Atrial Fibrillation; Risk Factors:Hypertension,                 Dyslipidemia and Former Smoker.  Sonographer:    Jannett Celestine RDCS (AE) Referring Phys: [id number] [place]  1. Left ventricular ejection fraction, by estimation, is 60 to 65%. The left ventricle has normal function. The left ventricle has no regional wall motion abnormalities. There is mild concentric left ventricular hypertrophy. Left ventricular diastolic function could not be evaluated.  2. Right ventricular systolic function is normal. The right ventricular size is moderately enlarged.  3. The mitral valve is degenerative. Trivial mitral valve regurgitation. No evidence of mitral stenosis.  4. The aortic valve is tricuspid. Aortic valve regurgitation is not visualized. Mild aortic valve stenosis. Conclusion(s)/Recommendation(s): Patient with stroke and atrial fibrillation. No intracardiac source of embolism identified other than atrial fibrilliation rhythm. FINDINGS  Left Ventricle: Left ventricular ejection fraction, by estimation, is 60 to 65%. The left ventricle has normal function. The left ventricle has no regional wall motion abnormalities. The left ventricular internal cavity size was normal in size. There is  mild concentric left ventricular hypertrophy. Left ventricular  diastolic function could not be evaluated due to atrial fibrillation. Left ventricular diastolic function could not be evaluated. Right Ventricle: The right ventricular size is moderately enlarged. No increase in right ventricular wall thickness. Right ventricular systolic function is normal. Left Atrium: Left atrial size was normal in size. Right Atrium: Right atrial size was normal in size. Pericardium: Trivial pericardial effusion is present. Presence of pericardial fat pad. Mitral Valve: The mitral valve is degenerative in appearance. Mild mitral annular calcification. Trivial mitral valve regurgitation. No evidence of mitral valve stenosis. Tricuspid Valve: The tricuspid valve is grossly normal. Tricuspid valve regurgitation is mild . No evidence of tricuspid stenosis. Aortic Valve: The aortic valve is tricuspid. . There is moderate thickening and moderate calcification of the aortic valve. Aortic valve regurgitation is not visualized. Mild aortic stenosis is present. There is moderate thickening of the aortic valve. There is moderate calcification of the aortic valve. Aortic valve mean gradient measures 9.0 mmHg. Aortic valve peak gradient measures 14.0 mmHg. Aortic valve area, by VTI measures 1.93 cm. Pulmonic Valve: The pulmonic valve was grossly normal. Pulmonic valve regurgitation is trivial. No evidence of pulmonic stenosis. Aorta: The aortic root and ascending aorta are structurally normal, with no evidence of dilitation. Venous: IVC assessment for right atrial pressure unable to be performed due to mechanical ventilation. IAS/Shunts: The atrial septum is grossly normal.  LEFT VENTRICLE PLAX 2D LVIDd:         3.70 cm LVIDs:         2.40 cm LV PW:         1.00 cm LV IVS:        1.30 cm LVOT diam:     2.20 cm LV SV:         66 LV SV Index:   34 LVOT Area:     3.80 cm  RIGHT VENTRICLE RV S prime:     13.40 cm/s TAPSE (M-mode): 1.9 cm LEFT ATRIUM             Index       RIGHT ATRIUM           Index LA  diam:        3.60 cm 1.88 cm/m  RA Area:     16.20 cm LA Vol (A2C):   58.0 ml 30.25 ml/m RA Volume:   39.70 ml  20.71 ml/m LA Vol (A4C):   35.2 ml 18.36 ml/m LA Biplane Vol: 45.3 ml 23.63 ml/m  AORTIC VALVE AV Area (Vmax):    2.07 cm AV Area (Vmean):   1.70 cm AV Area (VTI):     1.93 cm AV Vmax:           187.00 cm/s AV Vmean:          142.000 cm/s AV VTI:            0.343 m AV Peak Grad:      14.0 mmHg AV Mean Grad:      9.0 mmHg LVOT Vmax:         102.00 cm/s LVOT Vmean:        63.600 cm/s LVOT VTI:          0.174 m LVOT/AV VTI ratio: 0.51  AORTA Ao Root diam: 3.20 cm MITRAL VALVE                TRICUSPID VALVE MV Area (PHT): 4.39 cm     TR Peak grad:   35.0 mmHg MV Decel Time: 173 msec     TR Vmax:        296.00 cm/s MV E velocity: 118.00 cm/s                             SHUNTS                             Systemic VTI:  0.17 m                             Systemic Diam: 2.20 cm Eleonore Chiquito MD Electronically signed by Eleonore Chiquito MD Signature Date/Time: 09/03/2019/11:14:08 AM    Final    IR PERCUTANEOUS ART THROMBECTOMY/INFUSION INTRACRANIAL INC DIAG ANGIO  Result Date: 09/04/2019 INDICATION: 86 year old female with past medical history significant for atrial fibrillation (not on anticoagulation due to prior subdural hematoma), hypertension, hyperlipidemia, heart disease, mild cognitive impairment of vascular etiology complicated by alcohol use with a premorbid Rankin scale of 0. She presented to ED with aphasia, left gaze deviation and nonrhythmic shaking of the left side, NIHSS 22. She was last known well at 2:30 p.m. on 09/01/2019. No IV tPA administered as patient was outside of the window. Head CT showed prominent white matter disease and atrophy with hyperdense left MCA without large acute infarct or hemorrhage. CT angiogram of the head and neck showed a proximal left M1/MCA occlusion extending into the M2 segment. CT perfusion showed a core infarct of 4 mL with a penumbra of 83 mL. She was  then transferred to our service for a diagnostic cerebral angiogram and emergency mechanical thrombectomy. EXAM: DIAGNOSTIC CEREBRAL ANGIOGRAM MECHANICAL THROMBECTOMY FLAT PANEL HEAD CT COMPARISON:  CT/CT angiogram of the head and neck September 02, 2019 MEDICATIONS: No antibiotics given. ANESTHESIA/SEDATION: The procedure was performed in the general anesthesia. FLUOROSCOPY TIME:  Fluoroscopy Time: 31 minutes 12 seconds (678 mGy). COMPLICATIONS: None immediate. TECHNIQUE: Informed written consent was obtained from the patient's daughter after a thorough discussion of the procedural risks, benefits and alternatives. All questions were addressed. Maximal  Sterile Barrier Technique was utilized including caps, mask, sterile gowns, sterile gloves, sterile drape, hand hygiene and skin antiseptic. A timeout was performed prior to the initiation of the procedure. The right groin was prepped and draped in the usual sterile fashion. Using a micropuncture kit and the modified Seldinger technique, access was gained to the right common femoral artery and an 8 French sheath was placed. Under fluoroscopy, an 8 [place] Walrus balloon guide catheter was navigated over a 6 [place] VTK catheter and a 0.035" Terumo Glidewire into the aortic arch. The catheter was placed into the left common carotid artery and then advanced into the left internal carotid artery. The inner catheter was removed. Frontal and lateral angiograms of the head were obtained. FINDINGS: Occlusion of the left middle cerebral artery at its origin. Fair collateral flow is seen from the left ACA to the left MCA vascular tree. PROCEDURE: Under biplane roadmap, a zoom 71 aspiration catheter was navigated over a phenom 21 microcatheter and a synchro support microguidewire into the cavernous segment of the right ICA. The microcatheter was then navigated over the wire into the left M2/MCA inferior division branch. Then, a 5 x 37 mm embotrap stent retriever was deployed  spanning the M1 and M2 segments. The device was allowed to intercalated with the clot for 4 minutes. The microcatheter was removed. The aspiration catheter was advanced to the level of occlusion and connected to a penumbra aspiration pump. The guiding catheter balloon was inflated. The thrombectomy device and aspiration catheter were removed under constant aspiration. Follow-up left ICA angiogram showed recanalization of most of the M1 segment with occlusion at the level of the bifurcation. Under biplane roadmap, a zoom 71 aspiration catheter was navigated over a phenom 21 microcatheter and a synchro support microguidewire into the cavernous segment of the right ICA. The microcatheter was then navigated over the wire into the left M2/MCA superior division branch. Then, a 5 x 37 mm embotrap stent retriever was deployed spanning the M1 and M2 segments. The device was allowed to intercalated with the clot for 4 minutes. The microcatheter was removed. The aspiration catheter was advanced to the level of occlusion and connected to a penumbra aspiration pump. The guiding catheter balloon was inflated. The thrombectomy device and aspiration catheter were removed under constant aspiration. Follow-up left ICA angiogram showed recanalization of the M1 segment and superior division with persistent occlusion of the inferior division. Under biplane roadmap, a zoom 71 aspiration catheter was navigated over a phenom 21 microcatheter and a synchro support microguidewire into the cavernous segment of the right ICA. The microcatheter was then navigated over the wire into the left M2/MCA inferior division branch. Then, a 5 x 37 mm embotrap stent retriever was deployed spanning the M1 and M2 segments. The device was allowed to intercalated with the clot for 4 minutes. The microcatheter was removed. The aspiration catheter was advanced to the level of occlusion and connected to a penumbra aspiration pump. The guiding catheter balloon  was inflated. The thrombectomy device and aspiration catheter were removed under constant aspiration. Follow-up left ICA angiogram showed complete recanalization of the left MCA vascular tree (TICI3). Flat panel CT of the head was obtained and post processed in a separate workstation with concurrent attending physician supervision. Selected images were sent to PACS. Contrast staining in the left basal ganglia was noted. Small subarachnoid hemorrhage versus contrast extravasation in the left sylvian fissure. Delayed follow-up angiogram showed persistent patency of the left MCA vascular tree with no evidence  of embolus to new or distal territory. The catheter was subsequently withdrawn. Right femoral artery angiograms were obtained with right anterior oblique and lateral views. The puncture is at the level of the common femoral artery. The femoral sheath was exchanged over the wire for a Perclose ProGlide which was utilized for access closure. Immediate hemostasis was achieved. IMPRESSION: 1. Successful mechanical thrombectomy for treatment of a left M1/MCA proximal occlusion. Three passes performed with combined technique (aspiration left stent retriever) with complete recanalization. 2. No embolus to new or distal territory. 3. Small left sylvian subarachnoid hemorrhage versus process extravasation. PLAN: - Transfer to ICU- Maintain intubated as no covid-19 test result available- SBP 120-140 mmHg- Bed rest post femoral access x6 hours Electronically Signed   By: Pedro Earls M.D.   On: 09/04/2019 13:09   CT HEAD CODE STROKE WO CONTRAST  Result Date: 09/02/2019 CLINICAL DATA:  Code stroke EXAM: CT HEAD WITHOUT CONTRAST CT ANGIOGRAPHY HEAD AND NECK CT PERFUSION BRAIN TECHNIQUE: Multidetector CT imaging of the head and neck was performed using the standard protocol during bolus administration of intravenous contrast. Multiplanar CT image reconstructions and MIPs were obtained to evaluate the  vascular anatomy. Carotid stenosis measurements (when applicable) are obtained utilizing NASCET criteria, using the distal internal carotid diameter as the denominator. Multiphase CT imaging of the brain was performed following IV bolus contrast injection. Subsequent parametric perfusion maps were calculated using RAPID software. CONTRAST:  128m OMNIPAQUE IOHEXOL 350 MG/ML SOLN COMPARISON:  2020 head CT FINDINGS: Brain: There is no acute intracranial hemorrhage. Suspected new hypoattenuation of the body of caudate nucleus and posterior lentiform nucleus on the left. Prominence of the ventricles and sulci reflects generalized parenchymal volume. Confluent areas of hypoattenuation in the supratentorial white matter nonspecific but probably reflect advanced chronic microvascular ischemic changes. There is no extra-axial fluid collection. Vascular: Hyperdensity of the left MCA. Skull: Small right burr holes. Sinuses/Orbits: No acute finding. Other: Mastoid air cells are clear. ASPECTS ([place]roke Program Early CT Score) - Ganglionic level infarction (caudate, lentiform nuclei, internal capsule, insula, M1-M3 cortex): 5 - Supraganglionic infarction (M4-M6 cortex): 3 Total score (0-10 with 10 being normal): 8 CTA NECK FINDINGS Aortic arch: Calcified and noncalcified plaque along the arch and patent great vessel origins. Right carotid system: Patent. Mild calcified plaque at the ICA origin causing minimal stenosis. Left carotid system: Patent. Trace calcified plaque at the ICA origin without measurable stenosis. Vertebral arteries: Patent. Left vertebral artery is slightly dominant. Minor calcified plaque is present. Skeleton: Multilevel degenerative changes of the cervical spine. Other neck: No mass or adenopathy. Upper chest: No apical lung mass. Review of the MIP images confirms the above findings CTA HEAD FINDINGS Anterior circulation: Intracranial internal carotid arteries are patent with trace calcified plaque.  There is nearly occlusive thrombus within the proximal left M1 MCA with faint contrast enhancement more distally extending into a M2 branch. Diminished flow within distal left MCA branches. Right middle cerebral and both anterior cerebral arteries are patent. Posterior circulation: Intracranial vertebral, basilar, and posterior cerebral arteries are patent. There are bilateral posterior communicating arteries present. Venous sinuses: As permitted by contrast timing, patent. Review of the MIP images confirms the above findings CT Brain Perfusion Findings: CBF (<30%) Volume: 49mPerfusion (Tmax>6.0s) volume: 8778mismatch Volume: 15m59mfarction Location: Left basal ganglia corresponding to noncontrast CT findings. Area of penumbra is within the left MCA territory. IMPRESSION: No acute intracranial hemorrhage. 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 ASPECT score  of 8. There is nearly occlusive thrombus within the proximal left M1 MCA extending into an M2 branch. Perfusion imaging demonstrates core infarction of 4 mL (corresponds to noncontrast CT finding) and penumbra of 83 mL in the left MCA territory. Initial results were called by telephone at the time of interpretation on 09/02/2019 at 11:43 am to provider Dr. Curly Shores, who verbally acknowledged these results. CTA/CTP results were communicated at 11:55 amon 8/14/2021by text page via the [facility] messaging system. Electronically Signed   By: Macy Mis M.D.   On: 09/02/2019 12:05   CT ANGIO HEAD CODE STROKE  Result Date: 09/02/2019 CLINICAL DATA:  Code stroke EXAM: CT HEAD WITHOUT CONTRAST CT ANGIOGRAPHY HEAD AND NECK CT PERFUSION BRAIN TECHNIQUE: Multidetector CT imaging of the head and neck was performed using the standard protocol during bolus administration of intravenous contrast. Multiplanar CT image reconstructions and MIPs were obtained to evaluate the vascular anatomy. Carotid stenosis measurements (when applicable) are obtained  utilizing NASCET criteria, using the distal internal carotid diameter as the denominator. Multiphase CT imaging of the brain was performed following IV bolus contrast injection. Subsequent parametric perfusion maps were calculated using RAPID software. CONTRAST:  140m OMNIPAQUE IOHEXOL 350 MG/ML SOLN COMPARISON:  2020 head CT FINDINGS: Brain: There is no acute intracranial hemorrhage. Suspected new hypoattenuation of the body of caudate nucleus and posterior lentiform nucleus on the left. Prominence of the ventricles and sulci reflects generalized parenchymal volume. Confluent areas of hypoattenuation in the supratentorial white matter nonspecific but probably reflect advanced chronic microvascular ischemic changes. There is no extra-axial fluid collection. Vascular: Hyperdensity of the left MCA. Skull: Small right burr holes. Sinuses/Orbits: No acute finding. Other: Mastoid air cells are clear. ASPECTS ([place] Early CT Score) - Ganglionic level infarction (caudate, lentiform nuclei, internal capsule, insula, M1-M3 cortex): 5 - Supraganglionic infarction (M4-M6 cortex): 3 Total score (0-10 with 10 being normal): 8 CTA NECK FINDINGS Aortic arch: Calcified and noncalcified plaque along the arch and patent great vessel origins. Right carotid system: Patent. Mild calcified plaque at the ICA origin causing minimal stenosis. Left carotid system: Patent. Trace calcified plaque at the ICA origin without measurable stenosis. Vertebral arteries: Patent. Left vertebral artery is slightly dominant. Minor calcified plaque is present. Skeleton: Multilevel degenerative changes of the cervical spine. Other neck: No mass or adenopathy. Upper chest: No apical lung mass. Review of the MIP images confirms the above findings CTA HEAD FINDINGS Anterior circulation: Intracranial internal carotid arteries are patent with trace calcified plaque. There is nearly occlusive thrombus within the proximal left M1 MCA with faint  contrast enhancement more distally extending into a M2 branch. Diminished flow within distal left MCA branches. Right middle cerebral and both anterior cerebral arteries are patent. Posterior circulation: Intracranial vertebral, basilar, and posterior cerebral arteries are patent. There are bilateral posterior communicating arteries present. Venous sinuses: As permitted by contrast timing, patent. Review of the MIP images confirms the above findings CT Brain Perfusion Findings: CBF (<30%) Volume: 447mPerfusion (Tmax>6.0s) volume: 8792mismatch Volume: 72m23mfarction Location: Left basal ganglia corresponding to noncontrast CT findings. Area of penumbra is within the left MCA territory. IMPRESSION: No acute intracranial hemorrhage. 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 ASPECT score of 8. There is nearly occlusive thrombus within the proximal left M1 MCA extending into an M2 branch. Perfusion imaging demonstrates core infarction of 4 mL (corresponds to noncontrast CT finding) and penumbra of 83 mL in the left MCA territory. Initial results were called by  telephone at the time of interpretation on 09/02/2019 at 11:43 am to provider Dr. Curly Shores, who verbally acknowledged these results. CTA/CTP results were communicated at 11:55 amon 8/14/2021by text page via the [facility] messaging system. Electronically Signed   By: Macy Mis M.D.   On: 09/02/2019 12:05   CT ANGIO NECK CODE STROKE  Result Date: 09/02/2019 CLINICAL DATA:  Code stroke EXAM: CT HEAD WITHOUT CONTRAST CT ANGIOGRAPHY HEAD AND NECK CT PERFUSION BRAIN TECHNIQUE: Multidetector CT imaging of the head and neck was performed using the standard protocol during bolus administration of intravenous contrast. Multiplanar CT image reconstructions and MIPs were obtained to evaluate the vascular anatomy. Carotid stenosis measurements (when applicable) are obtained utilizing NASCET criteria, using the distal internal carotid diameter as the  denominator. Multiphase CT imaging of the brain was performed following IV bolus contrast injection. Subsequent parametric perfusion maps were calculated using RAPID software. CONTRAST:  19m OMNIPAQUE IOHEXOL 350 MG/ML SOLN COMPARISON:  2020 head CT FINDINGS: Brain: There is no acute intracranial hemorrhage. Suspected new hypoattenuation of the body of caudate nucleus and posterior lentiform nucleus on the left. Prominence of the ventricles and sulci reflects generalized parenchymal volume. Confluent areas of hypoattenuation in the supratentorial white matter nonspecific but probably reflect advanced chronic microvascular ischemic changes. There is no extra-axial fluid collection. Vascular: Hyperdensity of the left MCA. Skull: Small right burr holes. Sinuses/Orbits: No acute finding. Other: Mastoid air cells are clear. ASPECTS ([place] Early CT Score) - Ganglionic level infarction (caudate, lentiform nuclei, internal capsule, insula, M1-M3 cortex): 5 - Supraganglionic infarction (M4-M6 cortex): 3 Total score (0-10 with 10 being normal): 8 CTA NECK FINDINGS Aortic arch: Calcified and noncalcified plaque along the arch and patent great vessel origins. Right carotid system: Patent. Mild calcified plaque at the ICA origin causing minimal stenosis. Left carotid system: Patent. Trace calcified plaque at the ICA origin without measurable stenosis. Vertebral arteries: Patent. Left vertebral artery is slightly dominant. Minor calcified plaque is present. Skeleton: Multilevel degenerative changes of the cervical spine. Other neck: No mass or adenopathy. Upper chest: No apical lung mass. Review of the MIP images confirms the above findings CTA HEAD FINDINGS Anterior circulation: Intracranial internal carotid arteries are patent with trace calcified plaque. There is nearly occlusive thrombus within the proximal left M1 MCA with faint contrast enhancement more distally extending into a M2 branch. Diminished flow  within distal left MCA branches. Right middle cerebral and both anterior cerebral arteries are patent. Posterior circulation: Intracranial vertebral, basilar, and posterior cerebral arteries are patent. There are bilateral posterior communicating arteries present. Venous sinuses: As permitted by contrast timing, patent. Review of the MIP images confirms the above findings CT Brain Perfusion Findings: CBF (<30%) Volume: 447mPerfusion (Tmax>6.0s) volume: 8738mismatch Volume: 73m43mfarction Location: Left basal ganglia corresponding to noncontrast CT findings. Area of penumbra is within the left MCA territory. IMPRESSION: No acute intracranial hemorrhage. 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 ASPECT score of 8. There is nearly occlusive thrombus within the proximal left M1 MCA extending into an M2 branch. Perfusion imaging demonstrates core infarction of 4 mL (corresponds to noncontrast CT finding) and penumbra of 83 mL in the left MCA territory. Initial results were called by telephone at the time of interpretation on 09/02/2019 at 11:43 am to provider Dr. BhagCurly Shoreso verbally acknowledged these results. CTA/CTP results were communicated at 11:55 amon 8/14/2021by text page via the [facility] system. Electronically Signed   By: PranAddison Lank  On: 09/02/2019 12:05       HISTORY OF PRESENT ILLNESS Ruie JAMILLE FISHER is a 86 y.o. female with a history of atrial fibrillation (not on anticoagulation secondary to prior subdural hematoma), hypertension, hyperlipidemia, heart disease, mild cognitive impairment of vascular etiology complicated by alcohol use.  She lives in independent living at [place] at [facility], and family last spoke to her at 2:30 PM on 8/13.  In the morning of 8/14 at 10:30 AM when they called her again, her speech was garbled and EMS was activated.  On arrival the patient had left-sided gaze deviation and nonrhythmic shaking of the left side.  EMS noted  that blood pressures were in the 180s over 90s, glucose was 149.   She was not a tPA given she was out of the window. She was sent to IR for L M1 occlusion w/ TICI3 reperfusion with resultant small subdural hemorrhage.   HOSPITAL COURSE Ms. Ernesta CHERYLLYNN SARFF is a 86 y.o. female with history of atrial fibrillation (not on anticoagulation secondary to prior subdural hematoma),hypertension, hyperlipidemia, heart disease, mild cognitive impairment of vascular etiology complicated by alcohol use presenting with speech disturbance, left-sided gaze deviation and nonrhythmic shaking of the left side. She did not receive IV t-PA due to late presentation. IR - Proximal left M1/MCA occlusion. 3 passes aspiration + stent retriever with complete recanalization. Small right sylvian fissure SAH post procedure.  Stroke:  Left MCA territory acute infarction s/p IR w/ SAH - embolic - atrial fibrillation not on AC  Resultant aphasia and right hemiplegia  Code Stroke CT Head -  Left MCA territory acute infarction involving the basal ganglia. ASPECT score of 8.    CTA H&N - nearly occlusive thrombus within the proximal left M1 MCA extending into an M2 branch  CT Perfusion - core infarction of 4 mL (corresponds to noncontrast CT finding) and penumbra of 83 mL in the left MCA territory  CT head - Small right sylvian fissure SAH on post procedural flat panel CT.  MRI head - L MCA infarcts w/ L basal ganglia petechial hemorrhage or contrast staining, SAH  2D Echo - AF, EF 60-65%, no other SOE  [employer] Virus 2 - negative  LDL - 119  HgbA1c - 5.8  VTE prophylaxis - SCDs    No antithrombotic prior to admission, on aspirin 81 per tube daily (ok in setting of current [facility])  Therapy recommendations:   CIR  Disposition:  CIR  Acute Respiratory Failure, resolved  Secondary to stroke  Intubated for IR  Extubated 8/16  Mostly upper airway sounds, respiratory effort tiring pt.   Afebrile, WBC  normal  Portable CXR  low lung vols, streaky atx. No infiltrates  Attempt deeper suction at family request    Chest PT 4xd  On Robinul  Lethargy  Afebrile, WBC normal  Portable CXR  low lung vols, streaky atx. No infiltrates  Check UA neg   CT scan stable, no new abnormalities    EEG no sz  Amantadine added 09/07/19  More awake today   Atrial Fibrillation  Home anticoagulation:  none, hx SDH   Will consider AC prior to d/c and once able to swallow  Hypertension  Home BP meds: metoprolol ; Norvasc ; Cozaar  Current BP meds: norvasc 10, cozaar 25, metoprolol 50 bid    Stable  BP goal < 180 (mild hemorrhagic transformation, ok for higher GP than primary hemorrhage)  Long-term BP goal normotensive  Hyperlipidemia  Home Lipid lowering  medication: Zocor 20 mg daily  LDL 119, goal < 70  Current lipid lowering medication: changed to Lipitor 40 mg   Continue statin at discharge  Dysphagia  Secondary to stroke  NPO   On cortrak and TF   Speech on board   Other Stroke Risk Factors  Advanced age  Former cigarette smoker - quit  ETOH abuse, advised to drink no more than 1 alcoholic beverage per day. On CIWA initially but no treatment required. Monitoring ongoing  Obesity, Body mass index is 30.16 kg/m., recommend weight loss, diet and exercise as appropriate   Coronary artery disease  Other Active Problems  Hx of mild cognitive impairment  GERD   Mild Leukocytosis - WBCs - 8.5- resolved (afebrile)  Urinary retention, I&O cath x 3 -> foley placed Monday.  UA ok, UCx status post. Trial foley removal on rehab.  On home eye drops  On home kenalog for skin itching  DISCHARGE EXAM Blood pressure (!) 158/88, pulse 85, temperature 99.3 F (37.4 C), temperature source Oral, resp. rate 20, height 5' 5" (1.651 m), weight 82.2 kg, SpO2 97 %. Pleasant elderly Caucasian lady  Not in distress. . Afebrile. Head is nontraumatic. Neck is supple  without bruit.    Cardiac exam no murmur or gallop. Lungs are clear to auscultation. Distal pulses are well felt.  She is wearing a neck collar Neurological Exam : Patient is awake.  She appears to be aphasic and does not follow commands consistently.  She speaks only occasional words.  Has left gaze preference and she will not look to the right past midline.  She blinks to threat on the left but not on the right.  Right lower facial weakness.  Tongue midline.  She moves left upper and lower extremities purposefully against gravity.  Right upper extremity is flaccid and only trace withdrawal to pain.  She does withdraw right lower extremity pain but not voluntarily.  Deep tendon reflexes are depressed on the right and present on the left right plantar upgoing left downgoing.  Gait not tested  Discharge Diet  NPO, on tube feedings per Cortrak  DISCHARGE PLAN  Disposition:  Transfer to [place] for ongoing PT, OT and ST  aspirin 81 mg daily for secondary stroke prevention for now. Consider AC given AF dx once able to swallow and SAH resolved  Recommend ongoing stroke risk factor control by Primary Care Physician at time of discharge from inpatient rehabilitation.  Follow-up PCP [facility] in 2 weeks following discharge from rehab.  Follow-up in [facility] in 4 weeks following discharge from rehab, office to schedule an appointment.   Follow-up Katyucia de Sindy Messing MD (Interventional Neuroradiologist)  in 4 weeks following discharge from rehab, office to schedule an appointment.   40 minutes were spent preparing discharge.  Burnetta Sabin, MSN, APRN, ANVP-BC, AGPCNP-BC Advanced Practice Stroke Nurse [place] for Schedule & Pager information 09/07/2019 1:54 PM    I have personally obtained history,examined this patient, reviewed notes, independently viewed imaging studies, participated in  medical decision making and plan of care.ROS completed by me personally and pertinent positives fully documented  I have made any additions or clarifications directly to the above note. Agree with note above.    Antony Contras, MD Medical Director [facility] Pager: [phone] 09/07/2019 2:35 PM

## 2019-09-07 NOTE — Progress Notes (Signed)
Pt has been discharged via hospital bed. All belongings and daughter are at bedside. All equipment has been transferred as well. Pts tele has been disconnected and all IV are intact and running. Report has been given to oncoming nurse and patient will be sent to [phone].

## 2019-09-07 NOTE — Progress Notes (Signed)
Inpatient Rehabilitation Medication Review by a Pharmacist  A complete drug regimen review was completed for this patient to identify any potential clinically significant medication issues.  Clinically significant medication issues were identified:  no  Check AMION for pharmacist assigned to patient if future medication questions/issues arise during this admission.    Time spent performing this drug regimen review (minutes):  [place], [place] Clinical Pharmacist 09/07/2019 10:13 PM

## 2019-09-07 NOTE — Plan of Care (Signed)

## 2019-09-07 NOTE — Progress Notes (Signed)
Physical Therapy Treatment Patient Details Name: [name] MRN: [id number] DOB: [birth year] Today's Date: 09/07/2019    History of Present Illness 86 year old female with past medical history of A. fib not on anticoagulation due to frequent falling and prior subdural hematoma, hypertension, memory loss, GERD who was admitted to the hospital for altered mental status, found to have a left MCA stroke and is now status post thrombectomy. Extubated 8/16.     PT Comments    Pt very lethargic this session, requiring frequent cues to keep eyes open. She progressed to EOB and stood in stedy, however R lean was too great to safely pivot to recliner chair in stedy. Pt was returned to supine in bed at end of session.    Follow Up Recommendations  CIR     Equipment Recommendations  Wheelchair (measurements PT);Wheelchair cushion (measurements PT);Hospital bed    Recommendations for Other Services Rehab consult     Precautions / Restrictions Precautions Precautions: Fall Precaution Comments: corerack; R inattention    Mobility  Bed Mobility Overal bed mobility: Needs Assistance Bed Mobility: Sit to Supine;Supine to Sit     Supine to sit: Max assist;+2 for physical assistance Sit to supine: Max assist;+2 for physical assistance   General bed mobility comments: Pt was able, with multimodal cues to roll to her right using R bed rail, assist in progressing her left leg over to the side of the bed and scoot, Mod assist overall to help progress trunk up to sitting and scoot the rest of the way to EOB.  Max assist to return to supine due to assist at both trunk and legs.    Transfers Overall transfer level: Needs assistance   Transfers: Sit to/from Stand Sit to Stand: Mod assist;+2 physical assistance;+2 safety/equipment         General transfer comment: Pt able to stand with stedy, Good power up and technique, however once standing pt with significant R lateral lean requiring max A  to remain upright. Unable to safely pivot stedy to chair. Pt was instead returned to supine in bed.  Ambulation/Gait                 Stairs             Wheelchair Mobility    Modified Rankin (Stroke Patients Only) Modified Rankin (Stroke Patients Only) Pre-Morbid Rankin Score: Moderate disability Modified Rankin: Severe disability     Balance Overall balance assessment: Needs assistance Sitting-balance support: Feet supported;Bilateral upper extremity supported Sitting balance-Leahy Scale: Poor Sitting balance - Comments: Pt initally able to sit with min A however required mod A a she fatigued.  Postural control: Right lateral lean Standing balance support: Single extremity supported Standing balance-Leahy Scale: Zero Standing balance comment: +2 A for standing                            Cognition Arousal/Alertness: Lethargic Behavior During Therapy: Flat affect Overall Cognitive Status: Impaired/Different from baseline Area of Impairment: Attention;Memory;Following commands;Safety/judgement;Awareness;Problem solving                   Current Attention Level: Sustained Memory: Decreased short-term memory Following Commands: Follows one step commands inconsistently Safety/Judgement: Decreased awareness of deficits;Decreased awareness of safety Awareness: Intellectual Problem Solving: Slow processing;Decreased initiation;Difficulty sequencing;Requires verbal cues;Requires tactile cues General Comments: Pt very fatigued this session with eyes frequently closed. pt not verbalizing much. Slow to follow commands and does so inconsistantly.  Exercises      General Comments General comments (skin integrity, edema, etc.): Daughter present for session      Pertinent Vitals/Pain Pain Assessment: Faces Faces Pain Scale: Hurts a little bit Pain Location: bottom Pain Intervention(s): Repositioned    Home Living                       Prior Function            PT Goals (current goals can now be found in the care plan section) Acute Rehab PT Goals Patient Stated Goal: daughter interested in CIR PT Goal Formulation: With patient/family Time For Goal Achievement: 09/18/19 Potential to Achieve Goals: Fair Progress towards PT goals: Progressing toward goals    Frequency    Min 4X/week      PT Plan Current plan remains appropriate    Co-evaluation              AM-PAC PT "6 Clicks" Mobility   Outcome Measure  Help needed turning from your back to your side while in a flat bed without using bedrails?: A Lot Help needed moving from lying on your back to sitting on the side of a flat bed without using bedrails?: A Lot Help needed moving to and from a bed to a chair (including a wheelchair)?: Total Help needed standing up from a chair using your arms (e.g., wheelchair or bedside chair)?: Total Help needed to walk in hospital room?: Total Help needed climbing 3-5 steps with a railing? : Total 6 Click Score: 8    End of Session Equipment Utilized During Treatment: Gait belt Activity Tolerance: Patient limited by lethargy Patient left: in bed;with call bell/phone within reach;with family/visitor present (daughter in room) Nurse Communication: Mobility status PT Visit Diagnosis: Other abnormalities of gait and mobility (R26.89);Hemiplegia and hemiparesis;Other symptoms and signs involving the nervous system (R29.898) Hemiplegia - Right/Left: Right Hemiplegia - dominant/non-dominant: Dominant Hemiplegia - caused by: Cerebral infarction     Time: 1330-1402 PT Time Calculation (min) (ACUTE ONLY): 32 min  Charges:  $Therapeutic Activity: 23-37 mins                    Benjiman Core, [place] Pager [phone] Acute Rehab   Allena Katz 09/07/2019, 3:24 PM

## 2019-09-08 ENCOUNTER — Inpatient Hospital Stay (HOSPITAL_COMMUNITY): Payer: Medicare Other | Admitting: Occupational Therapy

## 2019-09-08 ENCOUNTER — Inpatient Hospital Stay (HOSPITAL_COMMUNITY): Payer: Medicare Other | Admitting: Physical Therapy

## 2019-09-08 ENCOUNTER — Inpatient Hospital Stay (HOSPITAL_COMMUNITY): Payer: Medicare Other | Admitting: Speech Pathology

## 2019-09-08 DIAGNOSIS — I63512 Cerebral infarction due to unspecified occlusion or stenosis of left middle cerebral artery: Secondary | ICD-10-CM

## 2019-09-08 LAB — URINE CULTURE: Culture: >=100000 — AB

## 2019-09-08 LAB — COMPREHENSIVE METABOLIC PANEL
ALT: 23 U/L (ref 0–44)
AST: 20 U/L (ref 15–41)
Albumin: 3.1 g/dL — ABNORMAL LOW (ref 3.5–5.0)
Alkaline Phosphatase: 72 U/L (ref 38–126)
Anion gap: 11 (ref 5–15)
BUN: 21 mg/dL (ref 8–23)
CO2: 26 mmol/L (ref 22–32)
Calcium: 9.2 mg/dL (ref 8.9–10.3)
Chloride: 95 mmol/L — ABNORMAL LOW (ref 98–111)
Creatinine, Ser: 0.76 mg/dL (ref 0.44–1.00)
GFR calc Af Amer: >60 mL/min (ref >60)
GFR calc non Af Amer: >60 mL/min (ref >60)
Glucose, Bld: 149 mg/dL — ABNORMAL HIGH (ref 70–99)
Potassium: 4.4 mmol/L (ref 3.5–5.1)
Sodium: 132 mmol/L — ABNORMAL LOW (ref 135–145)
Total Bilirubin: 0.7 mg/dL (ref 0.3–1.2)
Total Protein: 6.2 g/dL — ABNORMAL LOW (ref 6.5–8.1)

## 2019-09-08 LAB — GLUCOSE, CAPILLARY
Glucose-Capillary: 123 mg/dL — ABNORMAL HIGH (ref 70–99)
Glucose-Capillary: 129 mg/dL — ABNORMAL HIGH (ref 70–99)
Glucose-Capillary: 136 mg/dL — ABNORMAL HIGH (ref 70–99)
Glucose-Capillary: 151 mg/dL — ABNORMAL HIGH (ref 70–99)
Glucose-Capillary: 164 mg/dL — ABNORMAL HIGH (ref 70–99)
Glucose-Capillary: 193 mg/dL — ABNORMAL HIGH (ref 70–99)

## 2019-09-08 LAB — CBC WITH DIFFERENTIAL/PLATELET
Abs Immature Granulocytes: 0.11 10*3/uL — ABNORMAL HIGH (ref 0.00–0.07)
Basophils Absolute: 0 10*3/uL (ref 0.0–0.1)
Basophils Relative: 0 %
Eosinophils Absolute: 0.2 10*3/uL (ref 0.0–0.5)
Eosinophils Relative: 2 %
HCT: 38.8 % (ref 36.0–46.0)
Hemoglobin: 13.1 g/dL (ref 12.0–15.0)
Immature Granulocytes: 1 %
Lymphocytes Relative: 12 %
Lymphs Abs: 1.3 10*3/uL (ref 0.7–4.0)
MCH: 31.9 pg (ref 26.0–34.0)
MCHC: 33.8 g/dL (ref 30.0–36.0)
MCV: 94.4 fL (ref 80.0–100.0)
Monocytes Absolute: 1.2 10*3/uL — ABNORMAL HIGH (ref 0.1–1.0)
Monocytes Relative: 11 %
Neutro Abs: 7.9 10*3/uL — ABNORMAL HIGH (ref 1.7–7.7)
Neutrophils Relative %: 74 %
Platelets: 258 10*3/uL (ref 150–400)
RBC: 4.11 MIL/uL (ref 3.87–5.11)
RDW: 11.5 % (ref 11.5–15.5)
WBC: 10.7 10*3/uL — ABNORMAL HIGH (ref 4.0–10.5)
nRBC: 0 % (ref 0.0–0.2)

## 2019-09-08 MED ORDER — PROSOURCE TF PO LIQD
45.0000 mL | Freq: Three times a day (TID) | ORAL | Status: DC
  Administered 2019-09-08 – 2019-09-26 (×52): 45 mL
  Filled 2019-09-08 (×56): qty 45

## 2019-09-08 MED ORDER — CHLORHEXIDINE GLUCONATE 0.12 % MT SOLN
15.0000 mL | Freq: Two times a day (BID) | OROMUCOSAL | Status: DC
  Administered 2019-09-08 – 2019-09-26 (×35): 15 mL via OROMUCOSAL
  Filled 2019-09-08 (×36): qty 15

## 2019-09-08 MED ORDER — CHLORHEXIDINE GLUCONATE CLOTH 2 % EX PADS
6.0000 | MEDICATED_PAD | Freq: Every day | CUTANEOUS | Status: DC
  Administered 2019-09-08: 6 via TOPICAL

## 2019-09-08 MED ORDER — BLOOD PRESSURE CONTROL BOOK
Freq: Once | Status: AC
  Filled 2019-09-08: qty 1

## 2019-09-08 MED ORDER — FREE WATER
100.0000 mL | Freq: Four times a day (QID) | Status: DC
  Administered 2019-09-08 – 2019-09-12 (×16): 100 mL

## 2019-09-08 MED ORDER — ORAL CARE MOUTH RINSE
15.0000 mL | Freq: Two times a day (BID) | OROMUCOSAL | Status: DC
  Administered 2019-09-08 – 2019-09-25 (×30): 15 mL via OROMUCOSAL

## 2019-09-08 MED ORDER — OSMOLITE 1.2 CAL PO LIQD
1000.0000 mL | ORAL | Status: DC
  Administered 2019-09-08 – 2019-09-11 (×3): 1000 mL
  Filled 2019-09-08 (×3): qty 1000

## 2019-09-08 NOTE — Progress Notes (Signed)
Patient ID: [name], female   DOB: 11-25-1932, 86 y.o.   MRN: [id number]   Sw reached out to [place] at [place] to inform facility of pt's current status. Has informed facility that pt will require additional support if progressed well to return. Will continue to follow up with [facility] every Wednesday.

## 2019-09-08 NOTE — Progress Notes (Signed)
[place] PHYSICAL MEDICINE & REHABILITATION PROGRESS NOTE   Subjective/Complaints:  Pt aphasic, spoke to daughter at bedside regarding imaging   ROS- cannot obtain  Objective:   CT HEAD WO CONTRAST  Result Date: 09/06/2019 CLINICAL DATA:  Stroke post left MCA thrombectomy and recanalization 09/04/2019. Follow-up intracranial hemorrhage. EXAM: CT HEAD WITHOUT CONTRAST TECHNIQUE: Contiguous axial images were obtained from the base of the skull through the vertex without intravenous contrast. COMPARISON:  CT head 09/05/2019 FINDINGS: Brain: Small amount of hemorrhage in the left putamen with associated surrounding low-density infarct. Low density in the left putamen compatible with acute infarct also unchanged. Small amount of hemorrhage in the left sylvian fissure similar to the prior study. Hemorrhage in the left superior anterior temporal lobe unchanged. No new areas of hemorrhage Moderate atrophy. Chronic microvascular ischemic changes in the white matter. Negative for hydrocephalus Vascular: Negative for hyperdense vessel Skull: 2 burr holes in the right parietal bone. No acute skeletal abnormality Sinuses/Orbits: Chronic depressed fracture left orbital floor unchanged. Bilateral proptosis without orbital mass. Mild mucosal edema paranasal sinuses. Air-fluid level sphenoid sinus. Other: None IMPRESSION: Stable CT without evidence of new hemorrhage Hemorrhagic infarction left putamen stable. Small amount of hemorrhage in the left sylvian fissure and left superior temporal lobe anteriorly stable. Electronically Signed   By: Franchot Gallo M.D.   On: 09/06/2019 13:59   DG CHEST PORT 1 VIEW  Result Date: 09/06/2019 CLINICAL DATA:  Respiratory failure.  Lethargy. EXAM: PORTABLE CHEST 1 VIEW COMPARISON:  01/14/2008 FINDINGS: The heart is within normal limits in size given the AP projection and portable technique. Moderate tortuosity and calcification of the thoracic aorta. Pulmonary hila are slightly  prominent but probably normal for age and degree of inspiration. Low lung volumes with vascular crowding and streaky atelectasis. No infiltrates, edema, effusions or pneumothorax. The bony thorax is intact. IMPRESSION: 1. Low lung volumes with vascular crowding and streaky atelectasis. 2. No infiltrates, edema or effusions. Electronically Signed   By: Marijo Sanes M.D.   On: 09/06/2019 13:25   EEG adult  Result Date: 09/06/2019 Lora Havens, MD     09/06/2019  3:46 PM Patient Name: [name] MRN: [id number] Epilepsy Attending: Lora Havens Referring Physician/Provider: Burnetta Sabin, NP Date: 09/06/2019 Duration: 21.30 mins Patient history: 86 year old female who presented with speech disturbance, left-sided gaze deviation and nonrhythmic shaking of left side.  CT showed left MCA acute infarct.  EEG to assess for seizures. Level of alertness: Awake AEDs during EEG study: None Technical aspects: This EEG study was done with scalp electrodes positioned according to the 10-20 International system of electrode placement. Electrical activity was acquired at a sampling rate of 500Hz  and reviewed with a high frequency filter of 70Hz  and a low frequency filter of 1Hz . EEG data were recorded continuously and digitally stored. Description: The posterior dominant rhythm consists of 8 Hz activity of moderate voltage (25-35 uV) seen predominantly in posterior head regions, symmetric and reactive to eye opening and eye closing. EEG showed continuous generalized and lateralized left hemisphere 3 to 6 Hz theta-delta slowing with overriding 13-15hz  generalized beta activity. Hyperventilation and photic stimulation were not performed.   ABNORMALITY -Continuous slow, generalized and lateralized left hemisphere IMPRESSION: This study is suggestive of cortical dysfunction in left hemisphere likely secondary to underlying infarct as well as mild to moderate diffuse encephalopathy, nonspecific etiology. No seizures or  definite epileptiform discharges were seen throughout the recording. Agenda  09/06/19 0157 09/07/19 0215  WBC 8.7 8.5  HGB 12.7 12.5  HCT 37.2 37.3  PLT 210 225   Recent Labs    09/06/19 0157 09/07/19 0215  NA 131* 131*  K 3.7 4.0  CL 96* 96*  CO2 26 25  GLUCOSE 141* 142*  BUN 19 21  CREATININE 0.64 0.66  CALCIUM 9.1 9.0    Intake/Output Summary (Last 24 hours) at 09/08/2019 0835 Last data filed at 09/08/2019 0520 Gross per 24 hour  Intake 850 ml  Output 1000 ml  Net -150 ml     Physical Exam: Vital Signs Blood pressure (!) 149/78, pulse 67, temperature 98.7 F (37.1 C), temperature source Axillary, resp. rate 18, weight 68.6 kg, SpO2 98 %.  General: No acute distress Mood and affect are appropriate Heart: Regular rate and rhythm no rubs murmurs or extra sounds Lungs: Clear to auscultation, breathing unlabored, no rales or wheezes Abdomen: Positive bowel sounds, soft nontender to palpation, nondistended Extremities: No clubbing, cyanosis, or edema Skin: No evidence of breakdown, no evidence of rash Neurologic: Cranial nerves II through XII intact, motor strength is4/5 in left 0/5 right deltoid, bicep, tricep, grip, hip flexor, knee extensors, ankle dorsiflexor and plantar flexor Sensory exam difficult to assess, no wince to pinch RUYE, winces to pinch RLE  Cerebellar exam too weak to assess on RIght side  Musculoskeletal:No joint swelling     Assessment/Plan: 1. Functional deficits secondary to Left BG infarct  which require 3+ hours per day of interdisciplinary therapy in a comprehensive inpatient rehab setting.  Physiatrist is providing close team supervision and 24 hour management of active medical problems listed below.  Physiatrist and rehab team continue to assess barriers to discharge/monitor patient progress toward functional and medical goals  Care Tool:  Bathing              Bathing assist       Upper Body  Dressing/Undressing Upper body dressing   What is the patient wearing?: Hospital gown only    Upper body assist Assist Level: Maximal Assistance - Patient 25 - 49%    Lower Body Dressing/Undressing Lower body dressing      What is the patient wearing?: Incontinence brief     Lower body assist Assist for lower body dressing: Maximal Assistance - Patient 25 - 49%     Toileting Toileting    Toileting assist Assist for toileting: Dependent - Patient 0%     Transfers Chair/bed transfer  Transfers assist           Locomotion Ambulation   Ambulation assist              Walk 10 feet activity   Assist           Walk 50 feet activity   Assist           Walk 150 feet activity   Assist           Walk 10 feet on uneven surface  activity   Assist           Wheelchair     Assist               Wheelchair 50 feet with 2 turns activity    Assist            Wheelchair 150 feet activity     Assist          Blood pressure (!) 149/78, pulse 67, temperature 98.7 F (37.1 C), temperature  source Axillary, resp. rate 18, weight 68.6 kg, SpO2 98 %.  Medical Problem List and Plan: 1.Right side weakness and aphasiasecondary to left MCA territory infarction status post IR revascularization with SAH -patient may Shower if has good enough sitting balance -ELOS/Goals: 3-4 weeks- goals min A hopefully 2. Antithrombotics: -DVT/anticoagulation:SCDs- had petechial hemrrhages -antiplatelet therapy: Aspirin 81 mg daily 3. Pain Management:Tylenol as needed 4. Mood:Amantadine 100 mg daily, Zoloft 50 mg daily, Aricept 10 mg nightly- just changed amantadine to liquid- first dose 8/19 in PM- might benefit from Ritalin to wake her up -antipsychotic agents: N/A 5. Neuropsych: This patientisNOTcapable of making decisions on herown behalf. 6. Skin/Wound Care:Routine skin  checks 7. Fluids/Electrolytes/Nutrition:Routine in and outs with follow-up chemistries 8. Dysphagia. Tube feeds as directed. Dietary follow-up. Speech therapy follow-up- has Cortrak- assess if things improves, or if needs PEG long term 9. Hypertension. Lopressor 50 mg twice daily, Cozaar 25 mg daily, Norvasc 10 mg daily.  Vitals:   09/07/19 2011 09/08/19 0519  BP: 119/70 (!) 149/78  Pulse: 69 67  Resp: 16 18  Temp: 98.5 F (36.9 C) 98.7 F (37.1 C)  SpO2: 98% 98%   10. Hyperlipidemia. Lipitor 11. History of alcohol use. Provide counseling with patient and family 44. Atrial fibrillation. No anticoagulation at this time. Cardiac rate controlled. Continue beta-blocker 13. Urinary retention- has foley since significant retention- cannot take flomax with Cortrak.  Try to remove foley if no retention may need periwick to reduce wetness 14. Sleep-/insomnia- pt took 30, not 3 mg of Melatonin nightly at home AND 3+ pills of ZZZquil at home to sleep nightly per daughter- FYI    LOS: 1 days A FACE TO [place] E Amarilis Belflower 09/08/2019, 8:35 AM

## 2019-09-08 NOTE — Progress Notes (Signed)
Initial Nutrition Assessment  DOCUMENTATION CODES:   Not applicable  INTERVENTION:   Tube feeding via Cortrak: - Osmolite 1.2 @ 65 ml/hr to run for 20 hours (tube feeds can be held for up to 4 hours for therapies) - ProSource TF 45 ml TID - Free water flushes of 100 ml q 6 hours  Tube feeding regimen and free water provides 1680 kcal, 105 grams of protein, and 1466 ml of H2O.   NUTRITION DIAGNOSIS:   Inadequate oral intake related to dysphagia, lethargy/confusion as evidenced by NPO status.  GOAL:   Patient will meet greater than or equal to 90% of their needs  MONITOR:   Diet advancement, Labs, Weight trends, TF tolerance  REASON FOR ASSESSMENT:   Consult Enteral/tube feeding initiation and management  ASSESSMENT:   86 year old female with PMH of atrial fibrillation, HTN, HLD, CAD, mild cognitive impairment of vascular etiology complicated by EtOH use. Presented 09/02/19 with right-sided weakness and aphasia. 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 Near occlusive thrombus within the proximal left M1 MCA extending into M2 branch. Pt underwent complete revascularization per IR and was intubated for a short time. Follow-up head CT 09/06/19 after revascularization procedure showed petechial hemorrhage at the basal ganglia and small subarachnoid hemorrhage along the sylvian fissure. Scan completed again on 09/06/19 showing no evidence of new hemorrhage. Currently NPO with Cortrak and tube feeds. Admitted to CIR at 09/07/19.   Received consult for tube feeding management. Cortrak in place. Noted plan for MBSS on Monday 8/23 to assess for possible diet advancement.  Spoke with pt's daughter Casimer Bilis at bedside. RD familiar with pt from acute admission.  Per previous discussion with pt's daughter [name], pt's appetite had decreased somewhat over the last year. [name] reports that pt typically ate 2-3 meals daily.  Breakfast (mostly liquids): orange juice,  water, diet Coke, "keto" coffee, tomato juice Lunch: hummus or something light Dinner: full meal with [place] popsicles, 2 glasses of wine  Pt is from [place] and typically goes for dinner or "happy hour" with friends.  Pt's weight has been stable PTA.  Both of pt's daughters have reported to me that pt had GI issues PTA and followed with Dr. Henrene Pastor but that no clear cause for pt's issues were found.  Current TF: Osmolite 1.2 @ 55 ml/hr, ProSource TF 45 ml BID  Medications reviewed and include: liquid MVI, protonix, vitamin B-12 3000 mcg daily  Labs reviewed: sodium 132 CBG's: 123-196 x 24 hours  UOP: 1000 ml x 24 hours  NUTRITION - FOCUSED PHYSICAL EXAM:    Most Recent Value  Orbital Region No depletion  Upper Arm Region No depletion  Thoracic and Lumbar Region No depletion  Buccal Region No depletion  Temple Region No depletion  Clavicle Bone Region Mild depletion  Clavicle and Acromion Bone Region Mild depletion  Scapular Bone Region No depletion  Dorsal Hand No depletion  Patellar Region No depletion  Anterior Thigh Region Mild depletion  Posterior Calf Region No depletion  Edema (RD Assessment) None  Hair Reviewed  Eyes Reviewed  Mouth Reviewed  Skin Reviewed  Nails Reviewed       Diet Order:   Diet Order            Diet NPO time specified  Diet effective now                 EDUCATION NEEDS:   Education needs have been addressed  Skin:  Skin Assessment: Skin Integrity Issues: Incisions:  right groin  Last BM:  09/07/19  Height:   Ht Readings from Last 1 Encounters:  09/08/19 5\' 5"  (1.651 m)    Weight:   Wt Readings from Last 1 Encounters:  09/08/19 68.6 kg    Ideal Body Weight:  56.8 kg  BMI:  Body mass index is 25.17 kg/m.  Estimated Nutritional Needs:   Kcal:  1650-1850  Protein:  85-100 grams  Fluid:  1.6-1.8 L    Gaynell Face, MS, RD, LDN Inpatient Clinical Dietitian Please see AMiON for contact  information.

## 2019-09-08 NOTE — Progress Notes (Signed)
Patient ID: [name], female   DOB: 09/12/1932, 86 y.o.   MRN: [id number] Met with the patient and her daughter to review nurse CM role and collaboration with the SW to facilitate discharge preparations and address nursing concerns. Reviewed secondary risk factors and offered handbooks/handouts for education however most information is geared towards diet modifications and patient is currently on TF via cortrak. SLP has scheduled a MBS for Monday 09/11/19 and will update following the procedure. Discussed SNF, HH, PCS with the patient and daughter and elaborated on information given to the daughter by the SW. Also reviewed physicians and [facility] network follow up after discharge. No other questions or concerns noted at this time. Margarito Liner

## 2019-09-08 NOTE — Progress Notes (Signed)
Inpatient Rehabilitation  Patient information reviewed and entered into eRehab system by Esmond Hinch M. Constantin Hillery, M.A., CCC/SLP, PPS Coordinator.  Information including medical coding, functional ability and quality indicators will be reviewed and updated through discharge.    

## 2019-09-08 NOTE — Evaluation (Signed)
Speech Language Pathology Assessment and Plan  Patient Details  Name: [name] MRN: [id number] Date of Birth: 01-12-1933  SLP Diagnosis: Aphasia;Dysarthria;Speech and Language deficits;Dysphagia;Cognitive Impairments  Rehab Potential: Good ELOS: 3-3.5 weeks    Today's Date: 09/08/2019 SLP Individual Time: 0730-0825 SLP Individual Time Calculation (min): 55 min   Hospital Problem: Principal Problem:   Left middle cerebral artery stroke [facility]) Active Problems:   Atrial fibrillation (HCC)   Mild cognitive impairment   HTN (hypertension)   Urinary retention  Past Medical History:  Past Medical History:  Diagnosis Date  . Arthritis   . Atrial fibrillation ([place])   . Cataract    BILATERAL  . DDD (degenerative disc disease), cervical   . Depression   . Diverticulosis   . Falls   . GERD (gastroesophageal reflux disease)   . Glaucoma   . Headache   . Hyperlipidemia   . Hyperlipidemia   . Hypertension   . Osteoporosis   . Peripheral neuropathy   . Spondylosis    lumbar  . Subdural hematoma [facility])    Past Surgical History:  Past Surgical History:  Procedure Laterality Date  . APPENDECTOMY    . BACK SURGERY  2010  . BURR HOLE FOR SUBDURAL HEMATOMA     x 2 hematoma  . COLONOSCOPY    . IR CT HEAD LTD  09/04/2019  . IR PERCUTANEOUS ART THROMBECTOMY/INFUSION INTRACRANIAL INC DIAG ANGIO  09/02/2019      . IR PERCUTANEOUS ART THROMBECTOMY/INFUSION INTRACRANIAL INC DIAG ANGIO  09/04/2019  . KNEE SURGERY Bilateral   . RADIOLOGY WITH ANESTHESIA N/A 09/02/2019   Procedure: IR WITH ANESTHESIA;  Surgeon: Radiologist, Medication, MD;  Location: [place];  Service: Radiology;  Laterality: N/A;  . VAGINAL HYSTERECTOMY      Assessment / Plan / Recommendation Clinical Impression   JQB:HALP [name] is an 86 year old right-handed female with history of atrial fibrillation not on anticoagulation secondary to prior subdural hematoma, hypertension, hyperlipidemia, CAD, mild cognitive  impairment of vascular etiology complicated by alcohol use maintained on Aricept. History taken from chart review. Patient resides independent living facility at [facility] in [place]. Presented 09/02/2019 with right side weakness and aphasia. EMS noted blood pressure of 180s/90s. Admission chemistry sodium 134, glucose 136, hemoglobin A1c 5.8. Cranial CT scan showed no acute intracranial hemorrhage. 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 Near occlusive thrombus within the proximal left M1 MCA extending into M2 branch. Patient did not receive TPA. Echocardiogram with ejection fraction of 60 to 65% no wall motion abnormalities. Patient did undergo complete recanalization per interventional radiologyand was intubated for a short time. Follow-up head CT 09/06/2019 after revascularization procedure showed petechial hemorrhage at the basal ganglia and small subarachnoid hemorrhage along the sylvian fissure and again scan completed 09/06/2019 showing no evidence of new hemorrhage. Hemorrhagic infarct left putamen stable. Small amount hemorrhage sylvian fissure and left superior temporal lobe anteriorly stable. Currently maintained on aspirin for CVA prophylaxis. Currently n.p.o. with alternative means of nutritional support. EEG suggestive of cortical dysfunction left hemisphere likely secondary to underlying infarct as well as mild to moderate encephalopathy no seizure activity. Hospital course further complicated by bouts of urinary retention post void residual 900 mL. Therapy evaluation completed and patient was admitted for a comprehensive rehab program 09/07/19 and SLP evaluations were completed 09/08/19 with results as follows:  Pt presents with severe oropharyngeal dysphagia, however slight improvements noted since last CSE (per chart review). Pt able to elicit volitional swallows but  not volitional coughing today. After oral care, she accepted conservative trials  of ice, thin H2O, and puree (applesauce) with 1 immediate cough response following a large bolus of thin via straw. Pt was alert during all PO intake, but exhibited generally poor bolus awareness, oral residue, and right anterior loss or ice and sips of thin. Multiple swallows also noted. Intake became more swift and timely with bites of cold applesauce. Pt with mostly fixed open mouth posture and vocal quality sounded "wet" and "gurgly" at baseline, therefore it was difficult to differentiate whether PO intake had impact on pharyngeal congestion at bedside. Now that pt is alert and participating well in PO trails, recommend MBSS to instrumentally assess oropharyngeal swallow function and potential for advancement - scheduled for Monday 09/11/19 at 0900. Recommend pt continue NPO for now with temporary alternative means of nutrition and medication administration.   Pt also continues to present with severe aphasia with expressive and receptive language deficits, dysarthria, and cognitive impairments marked by reduced attention, problem solving, and emergent awareness. She currently requires Max-Total A to complete basic familiar tasks at this time. She responded to yes/no questions with ~50% accuracy. She demonstrated ability to repeat at the word and short phrase level, although became increasingly fatigued throughout evaluation, which made it difficult to determine generative speech abilities. She counted from 1-10 with 90% accuracy, days of the week with 100% accuracy, months of the year with 0% accuracy. Pt with ~25% accuracy in confrontation naming tasks, however suspect increasing lethargy had in impact on performance. [name] was present and supportive at bedside and indicated pt's communication fluctuates throughout day, but at times has been able to speak in phrases and sentences with family members.  Recommend pt receive skilled ST services while inpatient to address dysphagia, aphasia, dysarthria,  and cognitive deficits as described above in order to ensure diet safety and efficiency as well as maximize her functional communication, independence, and safety prior to discharge.    Skilled Therapeutic Interventions          Clinical bedside swallow and cognitive-linguistic evaluations were administered and results were reviewed with pt and her daughter who was present at bedside (please see above for details regarding results).   SLP Assessment  Patient will need skilled [place] [facility] during CIR admission    Recommendations  SLP Diet Recommendations: NPO;Alternative means - temporary Medication Administration: Via alternative means Oral Care Recommendations: Oral care QID Patient destination: Home Follow up Recommendations: Home Health SLP;24 hour supervision/assistance Equipment Recommended: To be determined    SLP Frequency 3 to 5 out of 7 days   SLP Duration  SLP Intensity  SLP Treatment/Interventions 3.5-4 weeks  Minumum of 1-2 x/day, 30 to 90 minutes  Cognitive remediation/compensation;Cueing hierarchy;Dysphagia/aspiration precaution training;Functional tasks;Patient/family education;Therapeutic Activities;Internal/external aids;Environmental controls;Multimodal communication approach;Speech/Language facilitation    Pain Pain Assessment Pain Scale: Faces Faces Pain Scale: No hurt      SLP Evaluation Cognition Overall Cognitive Status: Impaired/Different from baseline Arousal/Alertness: Awake/alert Orientation Level: Oriented to person (difficult to assess due to expressive/receptive language deficits) Attention: Sustained Sustained Attention: Impaired Sustained Attention Impairment: Verbal basic;Functional basic Memory:  (UTA due to receptive/expessive language deficits) Awareness: Impaired Awareness Impairment: Emergent impairment Problem Solving: Impaired Problem Solving Impairment: Functional basic Executive Function:  (all impaired  due to lower level deficits) Safety/Judgment: Impaired  Comprehension Auditory Comprehension Overall Auditory Comprehension: Impaired Yes/No Questions: Impaired Basic Biographical Questions: 51-75% accurate Basic Immediate Environment Questions: 50-74% accurate Complex Questions: 50-74% accurate Commands: Impaired One  Step Basic Commands: 75-100% accurate Conversation: Simple Interfering Components: Attention;Processing speed EffectiveTechniques: Visual/Gestural cues;Extra processing time;Repetition Visual Recognition/Discrimination Discrimination: Not tested Reading Comprehension Reading Status: Not tested Expression Expression Primary Mode of Expression: Verbal Verbal Expression Overall Verbal Expression: Impaired Initiation: No impairment Automatic Speech: Name;Counting;Day of week Level of Generative/Spontaneous Verbalization: Word Repetition: No impairment Naming: Impairment Responsive: Not tested Confrontation: Impaired Convergent: Not tested Divergent: Not tested Verbal Errors: Perseveration;Not aware of errors Pragmatics: No impairment Interfering Components: Attention;Speech intelligibility Written Expression Written Expression: Not tested Oral Motor Oral Motor/Sensory Function Overall Oral Motor/Sensory Function: Moderate impairment Facial Symmetry: Abnormal symmetry right;Suspected CN VII (facial) dysfunction Facial Strength: Reduced right;Suspected CN VII (facial) dysfunction Facial Sensation: Reduced right;Suspected CN V (Trigeminal) dysfunction Lingual Symmetry: Abnormal symmetry right Lingual Strength: Reduced Motor Speech Overall Motor Speech: Impaired Respiration: Impaired Level of Impairment: Phrase Phonation: Low vocal intensity;Wet Resonance: Within functional limits Articulation: Impaired Level of Impairment: Word Intelligibility: Intelligibility reduced Word: 25-49% accurate Phrase: 25-49% accurate Sentence: Not tested Conversation: Not  tested Motor Speech Errors: Unaware  Care Tool Care Tool Cognition Expression of Ideas and Wants Expression of Ideas and Wants: Frequent difficulty - frequently exhibits difficulty with expressing needs and ideas   Understanding Verbal and Non-Verbal Content Understanding Verbal and Non-Verbal Content: Sometimes understands - understands only basic conversations or simple, direct phrases. Frequently requires cues to understand   Memory/Recall Ability *first 3 days only Memory/Recall Ability *first 3 days only: None of the above were recalled (although diffcult to assess due to expressive/receptive language deficits)      Intelligibility: Intelligibility reduced Word: 25-49% accurate Phrase: 25-49% accurate Sentence: Not tested Conversation: Not tested  Bedside Swallowing Assessment General Date of Onset: 09/02/19 Previous Swallow Assessment: BSE 8/16 Diet Prior to this Study: NPO;NG Tube Temperature Spikes Noted: No Respiratory Status: Room air History of Recent Intubation: Yes Length of Intubations (days): 2 days Date extubated: 09/04/19 Behavior/Cognition: Alert;Pleasant mood (but became increasingly lethargic as evaluation progressed) Oral Cavity - Dentition: Adequate natural dentition Self-Feeding Abilities: Needs assist Patient Positioning: Upright in bed Baseline Vocal Quality: Wet;Low vocal intensity Volitional Cough: Cognitively unable to elicit Volitional Swallow: Able to elicit  Oral Care Assessment Does patient have any of the following "high(er) risk" factors?: Diet - patient on tube feedings;Nutritional status - fluids only or NPO for >24 hours Patient is HIGH RISK: Non-ventilated: Order set for Adult Oral Care Protocol initiated - "High Risk Patients - Non-Ventilated" option selected  (see row information) Ice Chips Ice chips: Impaired Presentation: Spoon Oral Phase Impairments: Reduced lingual movement/coordination;Reduced labial seal Oral Phase Functional  Implications: Right anterior spillage Pharyngeal Phase Impairments: Wet Vocal Quality;Multiple swallows Thin Liquid Thin Liquid: Impaired Presentation: Straw Oral Phase Impairments: Reduced labial seal;Reduced lingual movement/coordination;Poor awareness of bolus Oral Phase Functional Implications: Right anterior spillage Pharyngeal  Phase Impairments: Cough - Immediate;Multiple swallows Nectar Thick Nectar Thick Liquid: Not tested Honey Thick Honey Thick Liquid: Not tested Puree Puree: Impaired Oral Phase Impairments: Reduced labial seal;Reduced lingual movement/coordination Pharyngeal Phase Impairments: Wet Vocal Quality;Multiple swallows Solid Solid: Not tested BSE Assessment Risk for Aspiration Impact on safety and function: Moderate aspiration risk Other Related Risk Factors: Decreased management of secretions;Cognitive impairment;Prolonged intubation  Short Term Goals: Week 1: SLP Short Term Goal 1 (Week 1): Pt will participate in PO trials of ice, thin H2O, and puree at bedside to demonstrate readiness for MBSS prior to initiation of PO diet. SLP Short Term Goal 2 (Week 1): Pt will communicate basic wants and needs at the word level  with Max A verbal and/or visual cues. SLP Short Term Goal 3 (Week 1): Pt will name common objects with 60% accuracy provided Max A multimodal cues. SLP Short Term Goal 4 (Week 1): Pt will answer basic yes/no questions to in response to wants/needs and basic biographical information with 60% accuracy with Max A mulimodal cues. SLP Short Term Goal 5 (Week 1): Pt will demonstrate awareness of functional and/or verbal errors with Max A multimodal cues. SLP Short Term Goal 6 (Week 1): Pt will sustain attention to basic functional tasks or 10 miunte intervals with Mod A multimodal cues for redirection.  Refer to Care Plan for Long Term Goals  Recommendations for other services: None   Discharge Criteria: Patient will be discharged from SLP if patient  refuses treatment 3 consecutive times without medical reason, if treatment goals not met, if there is a change in medical status, if patient makes no progress towards goals or if patient is discharged from hospital.  The above assessment, treatment plan, treatment alternatives and goals were discussed and mutually agreed upon: by family  Arbutus Leas 09/08/2019, 12:34 PM

## 2019-09-08 NOTE — Progress Notes (Signed)
Patient Details  Name: [name] MRN: [id number] Date of Birth: [birth year]  Today's Date: 09/08/2019  Hospital Problems: Principal Problem:   Left middle cerebral artery stroke [facility]) Active Problems:   Atrial fibrillation ([place])   Mild cognitive impairment   HTN (hypertension)   Urinary retention  Past Medical History:  Past Medical History:  Diagnosis Date   Arthritis    Atrial fibrillation ([place])    Cataract    BILATERAL   DDD (degenerative disc disease), cervical    Depression    Diverticulosis    Falls    GERD (gastroesophageal reflux disease)    Glaucoma    Headache    Hyperlipidemia    Hyperlipidemia    Hypertension    Osteoporosis    Peripheral neuropathy    Spondylosis    lumbar   Subdural hematoma (HCC)    Past Surgical History:  Past Surgical History:  Procedure Laterality Date   APPENDECTOMY     BACK SURGERY  2010   BURR HOLE FOR SUBDURAL HEMATOMA     x 2 hematoma   COLONOSCOPY     IR CT HEAD LTD  09/04/2019   IR PERCUTANEOUS ART THROMBECTOMY/INFUSION INTRACRANIAL INC DIAG ANGIO  09/02/2019       IR PERCUTANEOUS ART THROMBECTOMY/INFUSION INTRACRANIAL INC DIAG ANGIO  09/04/2019   KNEE SURGERY Bilateral    RADIOLOGY WITH ANESTHESIA N/A 09/02/2019   Procedure: IR WITH ANESTHESIA;  Surgeon: Radiologist, Medication, MD;  Location: [place];  Service: Radiology;  Laterality: N/A;   VAGINAL HYSTERECTOMY     Social History:  reports that she has quit smoking. She has never used smokeless tobacco. She reports current alcohol use of about 14.0 standard drinks of alcohol per week. She reports that she does not use drugs.  Family / Support Systems Marital Status: Widow/Widower Children: 3 Dtrs ([name], [place], Architect) Anticipated Caregiver: Casimer Bilis (POA) Ability/Limitations of Caregiver: Preparing to retire Caregiver Availability: Intermittent (Family Willing to Hire Help)  Social History Preferred language:  English Religion: Ep[employer]opalian Read: Yes Write: Yes   Abuse/Neglect Abuse/Neglect Assessment Can Be Completed: Yes Physical Abuse: Denies Verbal Abuse: Denies Sexual Abuse: Denies Exploitation of patient/patient's resources: Denies Self-Neglect: Denies  Emotional Status Pt's affect, behavior and adjustment status: Dtr reports pt seems like she trying, doesnt seem down Recent Psychosocial Issues: no Psychiatric History: no Substance Abuse History: 2 glasses of wine a day  Patient / Family Perceptions, Expectations & Goals Pt/Family understanding of illness & functional limitations: yes Premorbid pt/family roles/activities: Pt was indepedent with everything previously. Livesin [place] (IDP) Anticipated changes in roles/activities/participation: Pt will require support upon discharge. Lives in in independent living, will require more assit or SNF Pt/family expectations/goals: Goal to discharge back to [place] depending on level of care. May require SNF placement  [place] Agencies: None Premorbid Home Care/DME Agencies: None ([place], [place]) Transportation available at discharge: Family able to transport  Discharge Planning Living Arrangements: Alone Support Systems: Children Type of Residence: Independent Living Insurance Resources: Medicare Does the patient have any problems obtaining your medications?: No Care Coordinator Barriers to Discharge: Home environment access/layout Care Coordinator Barriers to Discharge Comments: Patient Lives in [place] Coordinator Anticipated Follow Up Needs: SNF, ALF/IL Expected length of stay: 20-24 Days  Clinical Impression Sw entered room, pt sitting up in chair falling asleep. Dtr at bedside, in introduced self explained role and addressed all questions and concerns. Will follow up for additional questions and concerns  [name] 09/08/2019,  1:02 PM

## 2019-09-08 NOTE — Progress Notes (Signed)
Patient ID: [name], female   DOB: March 05, 1932, 86 y.o.   MRN: [id number]   Sw providing daughter [name]) with SNF and [facility] agency resources.

## 2019-09-08 NOTE — Progress Notes (Signed)
[facility] Individual Statement of Services  Patient Name:  [name]  Date:  09/08/2019  Welcome to the [facility].  Our goal is to provide you with an individualized program based on your diagnosis and situation, designed to meet your specific needs.  With this comprehensive rehabilitation program, you will be expected to participate in at least 3 hours of rehabilitation therapies Monday-Friday, with modified therapy programming on the weekends.  Your rehabilitation program will include the following services:  Physical Therapy (PT), Occupational Therapy (OT), Speech Therapy (ST), 24 hour per day rehabilitation nursing, Therapeutic Recreaction (TR), Neuropsychology, Care Coordinator, Rehabilitation Medicine, Nutrition Services, Pharmacy Services and Other  Weekly team conferences will be held on Wednesdays to discuss your progress.  Your Inpatient Rehabilitation Care Coordinator will talk with you frequently to get your input and to update you on team discussions.  Team conferences with you and your family in attendance may also be held.  Expected length of stay: 20-24 Days  Overall anticipated outcome: Min A to Mod I  Depending on your progress and recovery, your program may change. Your Inpatient Rehabilitation Care Coordinator will coordinate services and will keep you informed of any changes. Your Inpatient Rehabilitation Care Coordinator's name and contact numbers are listed  below.  The following services may also be recommended but are not provided by the [facility]:    [place] will be made to provide these services after discharge if needed.  Arrangements include referral to agencies that provide these services.  Your insurance has been verified to be:  Medicare Your primary doctor is:  [facility]  Pertinent  information will be shared with your doctor and your insurance company.  Inpatient Rehabilitation Care Coordinator:  Erlene Quan, [place] or [phone]  Information discussed with and copy given to patient by: [name], 09/08/2019, 11:34 AM

## 2019-09-08 NOTE — Evaluation (Signed)
Occupational Therapy Assessment and Plan  Patient Details  Name: [name] MRN: [id number] Date of Birth: 1932-09-06  OT Diagnosis: abnormal posture, apraxia, cognitive deficits, disturbance of vision, flaccid hemiplegia and hemiparesis and muscle weakness (generalized) Rehab Potential: Rehab Potential (ACUTE ONLY): Good ELOS: ~3 weeks   Today's Date: 09/08/2019 OT Individual Time: 4098-1191 OT Individual Time Calculation (min): 60 min     Hospital Problem: Principal Problem:   Left middle cerebral artery stroke ([place]) Active Problems:   Atrial fibrillation ([place])   Mild cognitive impairment   HTN (hypertension)   Urinary retention   Past Medical History:  Past Medical History:  Diagnosis Date  . Arthritis   . Atrial fibrillation ( [place])   . Cataract    BILATERAL  . DDD (degenerative disc disease), cervical   . Depression   . Diverticulosis   . Falls   . GERD (gastroesophageal reflux disease)   . Glaucoma   . Headache   . Hyperlipidemia   . Hyperlipidemia   . Hypertension   . Osteoporosis   . Peripheral neuropathy   . Spondylosis    lumbar  . Subdural hematoma [facility])    Past Surgical History:  Past Surgical History:  Procedure Laterality Date  . APPENDECTOMY    . BACK SURGERY  2010  . BURR HOLE FOR SUBDURAL HEMATOMA     x 2 hematoma  . COLONOSCOPY    . IR CT HEAD LTD  09/04/2019  . IR PERCUTANEOUS ART THROMBECTOMY/INFUSION INTRACRANIAL INC DIAG ANGIO  09/02/2019      . IR PERCUTANEOUS ART THROMBECTOMY/INFUSION INTRACRANIAL INC DIAG ANGIO  09/04/2019  . KNEE SURGERY Bilateral   . RADIOLOGY WITH ANESTHESIA N/A 09/02/2019   Procedure: IR WITH ANESTHESIA;  Surgeon: Radiologist, Medication, MD;  Location: [facility];  Service: Radiology;  Laterality: N/A;  . VAGINAL HYSTERECTOMY      Assessment & Plan Clinical Impression: Patient is a 86 y.o. year old female right-handed female with history of atrial fibrillation not on anticoagulation secondary to prior subdural  hematoma, hypertension, hyperlipidemia, CAD, mild cognitive impairment of vascular etiology complicated by alcohol use maintained on Aricept. History taken from chart review. Patient resides independent living facility at [facility] in [place]. Presented 09/02/2019 with right side weakness and aphasia. EMS noted blood pressure of 180s/90s. Admission chemistry sodium 134, glucose 136, hemoglobin A1c 5.8. Cranial CT scan showed no acute intracranial hemorrhage. 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 Near occlusive thrombus within the proximal left M1 MCA extending into M2 branch. Patient did not receive TPA. Echocardiogram with ejection fraction of 60 to 65% no wall motion abnormalities. Patient did undergo complete recanalization per interventional radiologyand was intubated for a short time. Follow-up head CT 09/06/2019 after revascularization procedure showed petechial hemorrhage at the basal ganglia and small subarachnoid hemorrhage along the sylvian fissure and again scan completed 09/06/2019 showing no evidence of new hemorrhage. Hemorrhagic infarct left putamen stable. Small amount hemorrhage sylvian fissure and left superior temporal lobe anteriorly stable. Currently maintained on aspirin for CVA prophylaxis. Currently n.p.o. with alternative means of nutritional support. EEG suggestive of cortical dysfunction left hemisphere likely secondary to underlying infarct as well as mild to moderate encephalopathy no seizure activity. Hospital course further complicated by bouts of urinary retention post void residual 900 mL. Patient transferred to CIR on 09/07/2019 .    Patient currently requires total with basic self-care skills and basic mobility- often utilizing a second person secondary to muscle weakness, decreased cardiorespiratoy endurance, impaired  timing and sequencing, unbalanced muscle activation, motor apraxia and decreased coordination, left inattention,  decreased midline orientation, decreased initiation, decreased attention, decreased awareness, decreased problem solving, decreased safety awareness and delayed processing and decreased sitting balance, decreased standing balance, decreased postural control, hemiplegia and decreased balance strategies.  Prior to hospitalization, patient could complete ADL with independent .  Patient will benefit from skilled intervention to decrease level of assist with basic self-care skills and increase independence with basic self-care skills prior to discharge home with care partner.  Anticipate patient will require minimal physical assistance and follow up home health.  OT - End of Session Activity Tolerance: Tolerates < 10 min activity, no significant change in vital signs Endurance Deficit: Yes OT Assessment Rehab Potential (ACUTE ONLY): Good OT Barriers to Discharge: Decreased caregiver support OT Patient demonstrates impairments in the following area(s): Balance;Perception;Safety;Cognition;Sensory;Edema;Endurance;Motor;Pain;Nutrition;Skin Integrity;Vision OT Basic ADL's Functional Problem(s): Eating;Grooming;Bathing;Dressing;Toileting OT Transfers Functional Problem(s): Toilet;Tub/Shower OT Additional Impairment(s): Fuctional Use of Upper Extremity OT Plan OT Intensity: Minimum of 1-2 x/day, 45 to 90 minutes OT Frequency: 5 out of 7 days OT Duration/Estimated Length of Stay: ~3 weeks OT Treatment/Interventions: Balance/vestibular training;Discharge planning;Functional electrical stimulation;Pain management;Self Care/advanced ADL retraining;Therapeutic Activities;UE/LE Coordination activities;Cognitive remediation/compensation;Disease mangement/prevention;Functional mobility training;Patient/family education;Skin care/wound managment;Therapeutic Exercise;Visual/perceptual remediation/compensation;Community reintegration;DME/adaptive equipment instruction;Neuromuscular re-education;Psychosocial  support;Splinting/orthotics;UE/LE Strength taining/ROM;Wheelchair propulsion/positioning OT Self Feeding Anticipated Outcome(s): min A OT Basic Self-Care Anticipated Outcome(s): min A OT Toileting Anticipated Outcome(s): min A OT Bathroom Transfers Anticipated Outcome(s): min A OT Recommendation Recommendations for Other Services: Neuropsych consult Patient destination: Home Follow Up Recommendations: Home health OT;Skilled nursing facility Equipment Recommended: To be determined   OT Evaluation Precautions/Restrictions  Precautions Precautions: Fall Precaution Comments: corerack; R inattention Restrictions Weight Bearing Restrictions: No General Chart Reviewed: Yes Family/Caregiver Present: Yes (daughter present)  Pain Pain Assessment Pain Scale: Faces Faces Pain Scale: No hurt Home Living/Prior Functioning Home Living Family/patient expects to be discharged to:: Private residence (independent living facility) Living Arrangements: Alone Available Help at Discharge:  (will need to assess who can A if she goes to the ALF at her ILF) Type of Home: Independent living facility Home Access: Level entry Home Layout: One level Bathroom Shower/Tub: Health visitor: Handicapped height Vision Baseline Vision/History: Wears glasses Wears Glasses: Reading only Vision Assessment?: Vision impaired- to be further tested in functional context;Yes Eye Alignment: Impaired (comment) Alignment/Gaze Preference: Gaze left Tracking/Visual Pursuits: Decreased smoothness of horizontal tracking;Decreased smoothness of vertical tracking Saccades: Additional eye shifts occurred during testing Visual Fields: Impaired-to be further tested in functional context Perception  Perception: Impaired Praxis Praxis: Impaired Praxis Impairment Details: Motor planning;Initiation Cognition Overall Cognitive Status: Impaired/Different from baseline Arousal/Alertness: Lethargic Orientation  Level: Person;Place;Situation Person:  (unable to answer) Place:  (unable to answer) Situation:  (unable to answer) Year:  (unable to answer) Month:  (unable to answer) Day of Week:  (unable to answer) Memory:  (unable to assess) Memory Recall Sock:  (unable to assess) Memory Recall Blue:  (unable to assess) Memory Recall Bed:  (unable to assess) Attention: Focused Focused Attention: Impaired Focused Attention Impairment: Verbal basic Sustained Attention: Impaired Sustained Attention Impairment: Verbal basic;Functional basic Awareness: Impaired Awareness Impairment: Intellectual impairment Problem Solving: Impaired Problem Solving Impairment: Functional basic Executive Function:  (all impaired due to lower level of cognition) Safety/Judgment: Impaired Comments: very lethargic during eval Sensation Sensation Light Touch: Impaired Detail Light Touch Impaired Details: Impaired RUE;Impaired RLE Proprioception: Impaired Detail Proprioception Impaired Details: Impaired RUE;Impaired RLE Coordination Gross Motor Movements are Fluid and Coordinated: No Fine Motor Movements  are Fluid and Coordinated: No Coordination and Movement Description: no active movement detected in the right UE/ LE Finger Nose Finger Test: unable on the right and unable to follow the direction for the left due to fatigue and decr attention Motor  Motor Motor: Hemiplegia;Motor apraxia;Abnormal postural alignment and control  Trunk/Postural Assessment  Cervical Assessment Cervical Assessment:  (forward flex) Thoracic Assessment Thoracic Assessment:  (forward flexed and tilted to the right) Lumbar Assessment Lumbar Assessment:  (posterior pelvic tilt) Postural Control Postural Control: Deficits on evaluation Trunk Control: poor Righting Reactions: delayed Protective Responses: absent  Balance Balance Balance Assessed: Yes Static Sitting Balance Static Sitting - Balance Support: Left upper extremity  supported Static Sitting - Level of Assistance: 4: Min assist;3: Mod assist Static Sitting - Comment/# of Minutes: ~20 min on the EOB Static Standing Balance Static Standing - Level of Assistance: 1: +2 Total assist Static Standing - Comment/# of Minutes: unable Extremity/Trunk Assessment RUE Assessment RUE Assessment: Exceptions to [facility] General Strength Comments: arthrtis in bilateral hands RUE Body System: Neuro Brunstrum levels for arm and hand: Arm;Hand Brunstrum level for arm: Stage I Presynergy Brunstrum level for hand: Stage I Flaccidity RUE AROM (degrees) RUE Overall AROM Comments: PROM - WFL RUE Tone RUE Tone: Within Functional Limits LUE Assessment LUE Assessment: Within Functional Limits  Care Tool Care Tool Self Care Eating Eating activity did not occur: Safety/medical concerns (NPO)      Oral Care    Oral Care Assist Level: Dependent - Patient 0%) (suction)    Bathing     Body parts bathed by helper: Right arm;Right lower leg;Left arm;Chest;Left lower leg;Face;Abdomen;Front perineal area;Buttocks;Right upper leg;Left upper leg   Assist Level: Dependent - Patient 0%    Upper Body Dressing(including orthotics)   What is the patient wearing?: Pull over shirt   Assist Level: Total Assistance - Patient < 25%    Lower Body Dressing (excluding footwear)   What is the patient wearing?: Pants;Incontinence brief Assist for lower body dressing: Dependent - Patient 0%    Putting on/Taking off footwear   What is the patient wearing?: Non-skid slipper socks Assist for footwear: Dependent - Patient 0%       Care Tool Toileting Toileting activity   Assist for toileting: Dependent - Patient 0%     Care Tool Bed Mobility Roll left and right activity   Roll left and right assist level: Dependent - Patient 0%    Sit to lying activity   Sit to lying assist level: Total Assistance - Patient < 25%    Lying to sitting edge of bed activity   Lying to sitting edge of  bed assist level: Total Assistance - Patient < 25%     Care Tool Transfers Sit to stand transfer Sit to stand activity did not occur: Safety/medical concerns      Chair/bed transfer Chair/bed transfer activity did not occur: Safety/medical concerns (would recommend lift for patient- needed skilled intervention to transfer) Chair/bed transfer assist level: Dependent - mechanical lift     Toilet transfer Toilet transfer activity did not occur: Safety/medical concerns       Care Tool Cognition Expression of Ideas and Wants Expression of Ideas and Wants: Rarely/Never expressess or very difficult - rarely/never expresses self or speech is very difficult to understand   Understanding Verbal and Non-Verbal Content Understanding Verbal and Non-Verbal Content: Rarely/never understands   Memory/Recall Ability *first 3 days only Memory/Recall Ability *first 3 days only: None of the above were recalled (although diffcult  to assess due to expressive/receptive language deficits)    Refer to Care Plan for Long Term Goals  SHORT TERM GOAL WEEK 1 OT Short Term Goal 1 (Week 1): Pt will sit EOB statically for ~5 min with supervison in prep for ADL task OT Short Term Goal 2 (Week 1): Pt will attend to familar ADL task demonstrating sustained attention for ~3 min with min cues OT Short Term Goal 3 (Week 1): Pt will don shirt with mod A OT Short Term Goal 4 (Week 1): Pt will in bed with mod A in prep for aDL tasks  Recommendations for other services: Neuropsych when appropriate   Skilled Therapeutic Intervention 1:1 EVAl initiated with OT goals, purpose and role reviewed with pt and pt's daughter. Self care retraining: Pt very lethargic during entire session. Pt performed rolling in bed for peri care after incontinent BM and donning of LB clothing with total A. Pt with difficulty following one step commands to assist caregiver requiring total A. Pt continued to close her eyes during session. Total A to  come to EOB with min to mod A to maintain upright sitting posture/ balance. Engaged in [place] with total A. Pt continue to present lethargic. Pt would answer yes/ no questions but answers were inconsistently correct. Pt required total A +2 to transfer into tilt in space w/c to promote upright posture (with ability to tilt for pressure relief) with total A for proper positioning/ weight shift, hand positioning, and lifting A. Total A for grooming at the sink. Pt does present with decr initiation for all tasks. Left pt resting in the tilt in space w/c.  ADL ADL Eating: NPO Grooming: Dependent Upper Body Bathing: Dependent Lower Body Bathing: Dependent Upper Body Dressing: Dependent Lower Body Dressing: Dependent Toileting: Dependent Toilet Transfer: Not assessed Tub/Shower Transfer: Not assessed Mobility  Bed Mobility Bed Mobility: Rolling Right;Rolling Left;Left Sidelying to Sit;Sitting - Scoot to Edge of Bed Rolling Right: Dependent - Patient equal 0% Rolling Left: Dependent - Patient equal 0% Left Sidelying to Sit: Total Assistance - Patient < 25% Supine to Sit: Total Assistance - Patient < 25% Sitting - Scoot to Edge of Bed: Total Assistance - Patient < 25%   Discharge Criteria: Patient will be discharged from OT if patient refuses treatment 3 consecutive times without medical reason, if treatment goals not met, if there is a change in medical status, if patient makes no progress towards goals or if patient is discharged from hospital.  The above assessment, treatment plan, treatment alternatives and goals were discussed and mutually agreed upon: by patient  Nicoletta Ba 09/08/2019, 10:15 AM

## 2019-09-08 NOTE — Evaluation (Signed)
Physical Therapy Assessment and Plan  Patient Details  Name: [name] MRN: [id number] Date of Birth: 04-Mar-1932  PT Diagnosis: Abnormal posture, Abnormality of gait, Coordination disorder, Hemiplegia dominant, Hypotonia, Impaired cognition, Impaired sensation, Muscle spasms and Muscle weakness Rehab Potential: Good ELOS: 3 weeks   Today's Date: 09/09/2019 PT Individual Time:1300-1415    75 min   Hospital Problem: Principal Problem:   Left middle cerebral artery stroke [facility]) Active Problems:   Atrial fibrillation ([place])   Mild cognitive impairment   HTN (hypertension)   Urinary retention   Past Medical History:  Past Medical History:  Diagnosis Date  . Arthritis   . Atrial fibrillation ([place])   . Cataract    BILATERAL  . DDD (degenerative disc disease), cervical   . Depression   . Diverticulosis   . Falls   . GERD (gastroesophageal reflux disease)   . Glaucoma   . Headache   . Hyperlipidemia   . Hyperlipidemia   . Hypertension   . Osteoporosis   . Peripheral neuropathy   . Spondylosis    lumbar  . Subdural hematoma [facility])    Past Surgical History:  Past Surgical History:  Procedure Laterality Date  . APPENDECTOMY    . BACK SURGERY  2010  . BURR HOLE FOR SUBDURAL HEMATOMA     x 2 hematoma  . COLONOSCOPY    . IR CT HEAD LTD  09/04/2019  . IR PERCUTANEOUS ART THROMBECTOMY/INFUSION INTRACRANIAL INC DIAG ANGIO  09/02/2019      . IR PERCUTANEOUS ART THROMBECTOMY/INFUSION INTRACRANIAL INC DIAG ANGIO  09/04/2019  . KNEE SURGERY Bilateral   . RADIOLOGY WITH ANESTHESIA N/A 09/02/2019   Procedure: IR WITH ANESTHESIA;  Surgeon: Radiologist, Medication, MD;  Location: [place];  Service: Radiology;  Laterality: N/A;  . VAGINAL HYSTERECTOMY      Assessment & Plan Clinical Impression: Patient is a 86 year old right-handed female with history of atrial fibrillation not on anticoagulation secondary to prior subdural hematoma, hypertension, hyperlipidemia, CAD, mild cognitive  impairment of vascular etiology complicated by alcohol use maintained on Aricept. History taken from chart review. Patient resides independent living facility at [facility] in [place]. Presented 09/02/2019 with right side weakness and aphasia. EMS noted blood pressure of 180s/90s. Admission chemistry sodium 134, glucose 136, hemoglobin A1c 5.8. Cranial CT scan showed no acute intracranial hemorrhage. 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 Near occlusive thrombus within the proximal left M1 MCA extending into M2 branch. Patient did not receive TPA. Echocardiogram with ejection fraction of 60 to 65% no wall motion abnormalities. Patient did undergo complete recanalization per interventional radiologyand was intubated for a short time. Follow-up head CT 09/06/2019 after revascularization procedure showed petechial hemorrhage at the basal ganglia and small subarachnoid hemorrhage along the sylvian fissure and again scan completed 09/06/2019 showing no evidence of new hemorrhage. Hemorrhagic infarct left putamen stable. Small amount hemorrhage sylvian fissure and left superior temporal lobe anteriorly stable. Currently maintained on aspirin for CVA prophylaxis. Currently n.p.o. with alternative means of nutritional support. EEG suggestive of cortical dysfunction left hemisphere likely secondary to underlying infarct as well as mild to moderate encephalopathy no seizure activity.  Patient transferred to CIR on 09/07/2019 .   Patient currently requires total with mobility secondary to muscle weakness, muscle joint tightness and muscle paralysis, decreased cardiorespiratoy endurance, impaired timing and sequencing, abnormal tone, unbalanced muscle activation, decreased coordination and decreased motor planning, decreased visual acuity and decreased visual perceptual skills, decreased attention to right, decreased initiation,  decreased attention, decreased awareness,  decreased problem solving, decreased safety awareness, decreased memory and delayed processing and decreased sitting balance, decreased standing balance, decreased postural control, hemiplegia and decreased balance strategies.  Prior to hospitalization, patient was modified independent  with mobility and lived with   in a Independent living facility home.  Home access is  Level entry.  Patient will benefit from skilled PT intervention to maximize safe functional mobility, minimize fall risk and decrease caregiver burden for planned discharge SNF.  Anticipate patient will benefit from SNF placement  at discharge.  PT - End of Session Activity Tolerance: Tolerates < 10 min activity, no significant change in vital signs Endurance Deficit: Yes PT Assessment Rehab Potential (ACUTE/IP ONLY): Good PT Barriers to Discharge: [place] home environment;Decreased caregiver support;Home environment access/layout;Neurogenic Bowel & Bladder;Wound Care;Lack of/limited family support;Weight;Medication compliance;Behavior PT Patient demonstrates impairments in the following area(s): Balance;Nutrition;Skin Integrity;Behavior;Pain;Edema;Perception;Endurance;Safety;Motor PT Transfers Functional Problem(s): Bed Mobility;Floor;Bed to Chair;Other (comment);Furniture;Car PT Locomotion Functional Problem(s): Ambulation;Wheelchair Mobility;Stairs PT Plan PT Intensity: Minimum of 1-2 x/day ,45 to 90 minutes PT Frequency: 5 out of 7 days PT Duration Estimated Length of Stay: 3 weeks PT Treatment/Interventions: Training and development officer;Ambulation/gait training;Community reintegration;Cognitive remediation/compensation;Discharge planning;DME/adaptive equipment instruction;Functional mobility training;Pain management;Psychosocial support;Splinting/orthotics;Therapeutic Activities;UE/LE Strength taining/ROM;Visual/perceptual remediation/compensation;Disease management/prevention;Functional electrical  stimulation;Neuromuscular re-education;Patient/family education;Skin care/wound management;Stair training;Therapeutic Exercise;UE/LE Coordination activities;Wheelchair propulsion/positioning PT Transfers Anticipated Outcome(s): Moderate assist bed to  Chair PT Locomotion Anticipated Outcome(s): Min assist WC level at house hold distance PT Recommendation Follow Up Recommendations: Skilled nursing facility Patient destination: [place] (SNF) Equipment Recommended: To be determined   PT Evaluation Precautions/Restrictions Precautions Precautions: Fall Precaution Comments: corerack; R inattention Restrictions Weight Bearing Restrictions: No General   Vital SignsTherapy Vitals Temp: 98 F (36.7 C) Pulse Rate: 60 Resp: 16 BP: (!) 145/75 Patient Position (if appropriate): Sitting Oxygen Therapy SpO2: 100 % O2 Device: Room Air Pain   Faces: none  Home Living/Prior Functioning Home Living Available Help at Discharge:  (will need to assess who can A if she goes to the ALF at her ILF) Type of Home: Independent living facility Home Access: Level entry Home Layout: One level Bathroom Shower/Tub: Multimedia programmer: Handicapped height Vision/Perception  Vision - Assessment Eye Alignment: Impaired (comment) Alignment/Gaze Preference: Gaze left Tracking/Visual Pursuits: Decreased smoothness of horizontal tracking;Decreased smoothness of vertical tracking Saccades: Additional eye shifts occurred during testing Perception Perception: Impaired Praxis Praxis: Impaired Praxis Impairment Details: Motor planning;Initiation  Cognition Overall Cognitive Status: Impaired/Different from baseline Arousal/Alertness: Lethargic Attention: Focused Focused Attention: Impaired Focused Attention Impairment: Verbal basic Sustained Attention: Impaired Memory:  (unable to assess) Memory Recall Sock:  (unable to assess) Memory Recall Blue:  (unable to assess) Memory  Recall Bed:  (unable to assess) Awareness: Impaired Awareness Impairment: Intellectual impairment Problem Solving: Impaired Executive Function:  (all impaired due to lower level of cognition) Safety/Judgment: Impaired Comments: very lethargic during eval Sensation Sensation Light Touch: Impaired Detail Light Touch Impaired Details: Impaired RUE;Impaired RLE Proprioception: Impaired Detail Proprioception Impaired Details: Impaired RUE;Impaired RLE Coordination Gross Motor Movements are Fluid and Coordinated: No Fine Motor Movements are Fluid and Coordinated: No Coordination and Movement Description: no active movement detected in the right UE/ LE Finger Nose Finger Test: unable on the right and unable to follow the direction for the left due to fatigue and decr attention Motor  Motor Motor: Hemiplegia;Motor apraxia;Abnormal postural alignment and control   Trunk/Postural Assessment  Cervical Assessment Cervical Assessment:  (forward flex) Thoracic Assessment Thoracic Assessment:  (forward flexed and tilted to the right)  Lumbar Assessment Lumbar Assessment:  (posterior pelvic tilt) Postural Control Postural Control: Deficits on evaluation Trunk Control: poor Righting Reactions: delayed Protective Responses: absent  Balance Balance Balance Assessed: Yes Static Sitting Balance Static Sitting - Balance Support: Left upper extremity supported Static Sitting - Level of Assistance: 4: Min assist;3: Mod assist Static Sitting - Comment/# of Minutes: ~20 min on the EOB Static Standing Balance Static Standing - Level of Assistance: 1: +2 Total assist Static Standing - Comment/# of Minutes: unable Extremity Assessment  RUE Assessment RUE Assessment: Exceptions to [facility] General Strength Comments: arthrtis in bilateral hands RUE Body System: Neuro Brunstrum levels for arm and hand: Arm;Hand Brunstrum level for arm: Stage I Presynergy Brunstrum level for hand: Stage I Flaccidity RUE  AROM (degrees) RUE Overall AROM Comments: PROM - WFL RUE Tone RUE Tone: Within Functional Limits LUE Assessment LUE Assessment: Within Functional Limits      Care Tool Care Tool Bed Mobility Roll left and right activity   Roll left and right assist level: Dependent - Patient 0%    Sit to lying activity   Sit to lying assist level: Total Assistance - Patient < 25%    Lying to sitting edge of bed activity   Lying to sitting edge of bed assist level: Total Assistance - Patient < 25%     Care Tool Transfers Sit to stand transfer Sit to stand activity did not occur: Safety/medical concerns      Chair/bed transfer Chair/bed transfer activity did not occur: Safety/medical concerns (would recommend lift for patient- needed skilled intervention to transfer) Chair/bed transfer assist level: Dependent - mechanical lift     Toilet transfer Toilet transfer activity did not occur: Safety/medical concerns      Scientist, product/process development transfer activity did not occur: Safety/medical concerns        Care Tool Locomotion Ambulation Ambulation activity did not occur: Safety/medical concerns        Walk 10 feet activity Walk 10 feet activity did not occur: Safety/medical concerns       Walk 50 feet with 2 turns activity Walk 50 feet with 2 turns activity did not occur: Safety/medical concerns      Walk 150 feet activity Walk 150 feet activity did not occur: Safety/medical concerns      Walk 10 feet on uneven surfaces activity Walk 10 feet on uneven surfaces activity did not occur: Safety/medical concerns      Stairs Stair activity did not occur: Safety/medical concerns        Walk up/down 1 step activity Walk up/down 1 step or curb (drop down) activity did not occur: Safety/medical concerns     Walk up/down 4 steps activity did not occuR: Safety/medical concerns  Walk up/down 4 steps activity      Walk up/down 12 steps activity Walk up/down 12 steps activity did not occur:  Safety/medical concerns      Pick up small objects from floor Pick up small object from the floor (from standing position) activity did not occur: Safety/medical concerns      Wheelchair Will patient use wheelchair at discharge?: Yes     Wheelchair assist level: Dependent - Patient 0% Max wheelchair distance: 150  Wheel 50 feet with 2 turns activity   Assist Level: Dependent - Patient 0%  Wheel 150 feet activity   Assist Level: Dependent - Patient 0%    Refer to Care Plan for Long Term Goals  SHORT TERM GOAL WEEK 1 PT Short Term Goal 1 (Week 1):  Pt will maintain sitting balance EOB with mod assist up to 5 minutes PT Short Term Goal 2 (Week 1): Pt will initiate gait training PT Short Term Goal 3 (Week 1): Pt will demonstrate ability to remain aroused and engaged in therapy for >75% of session PT Short Term Goal 4 (Week 1): Pt will perform bed mobility with max assist  Recommendations for other services: None   Skilled Therapeutic Intervention  Pt received sitting in WC. Pt was extremely lethargic throughout session initially arousing to pain only and attending to PT for ~ 30sec before falling asleep. PT instructed patient in PT Evaluation and initiated treatment intervention; see below for results. PT educated patient in [place], rehab potential, rehab goals, and discharge recommendations. Pt transported to rehab gym in [facility]. Sit<>stand in parallel bars with max assist and R knee blocked. Total A to initaite, but once initiated pt able to maintain balance with max assist to block knee in standing 2 x 30sec Pt performed stepping task with LLE x 2, total A and R knee blocked  with mild pushers syndrome noted. Forward and lateral reaching task sitting to the L with dificulty achieving midline without assist. Pt returned to room and performed total A  Stand pivot transfer to maintain midline and block the RLE.  Sit>supine completed with total A for trunk and BLE management, and left supine in bed  with call bell in reach and all needs met. Throughout the remainder of PT treatment, Pt noted to have significant improvement in arousal once sit<>stand transfer performed.       Mobility Bed Mobility Bed Mobility: Rolling Right;Rolling Left;Left Sidelying to Sit;Sitting - Scoot to Edge of Bed Rolling Right: Dependent - Patient equal 0% Rolling Left: Dependent - Patient equal 0% Left Sidelying to Sit: Total Assistance - Patient < 25% Supine to Sit: Total Assistance - Patient < 25% Sitting - Scoot to Edge of Bed: Total Assistance - Patient < 25% Transfers Transfers: Corporate treasurer Transfers: 2 Helpers Locomotion  Gait Ambulation: No Gait Gait: No Stairs / Additional Locomotion Stairs: No Product manager Mobility: Yes Wheelchair Assistance: Dependent - Patient 0% Environmental health practitioner: Other (comment) Wheelchair Parts Management: Needs assistance Distance[facility]f [facility]  Discharge Criteria: Patient will be discharged from PT if patient refuses treatment 3 consecutive times without medical reason, if treatment goals not met, if there is a change in medical status, if patient makes no progress towards goals or if patient is discharged from hospital.  The above assessment, treatment plan, treatment alternatives and goals were discussed and mutually agreed upon: by patient  ALorie Phenix8/20/2021, 1:16 PM

## 2019-09-08 NOTE — Progress Notes (Signed)
Orthopedic Tech Progress Note Patient Details:  [name] 07-Sep-1932 [id number] Called in order to [employer] for a RESTING HAND SPLINT and PRAFO Patient ID: [name], female   DOB: [birth year], 86 y.o.   MRN: [id number]   [name] 09/08/2019, 2:31 PM

## 2019-09-08 NOTE — IPOC Note (Addendum)
Overall Plan of Care [facility]) Patient Details Name: [name] MRN: [id number] DOB: 03/22/1932  Admitting Diagnosis: Left middle cerebral artery stroke [facility])  Hospital Problems: Principal Problem:   Left middle cerebral artery stroke ([place]) Active Problems:   Atrial fibrillation ([facility])   Mild cognitive impairment   HTN (hypertension)   Urinary retention     Functional Problem List: Nursing Bladder, Bowel, Endurance, Medication Management, Motor, Nutrition, Safety, Pain  PT Balance, Nutrition, Skin Integrity, Behavior, Pain, Edema, Perception, Endurance, Safety, Motor  OT Balance, Perception, Safety, Cognition, Sensory, Edema, Endurance, Motor, Pain, Nutrition, Skin Integrity, Vision  SLP Cognition, Linguistic, Nutrition  TR         Basic ADL's: OT Eating, Grooming, Bathing, Dressing, Toileting     Advanced  ADL's: OT       Transfers: PT Bed Mobility, Floor, Bed to Chair, Other (comment), Furniture, Teacher, early years/pre, Metallurgist: PT Ambulation, Emergency planning/management officer, Stairs     Additional Impairments: OT Fuctional Use of Upper Extremity  SLP Swallowing, Communication, Social Cognition expression, comprehension Problem Solving, Attention, Awareness  TR      Anticipated Outcomes Item Anticipated Outcome  Self Feeding min A  Swallowing  Min A   Basic self-care  min A  Toileting  min A   Bathroom Transfers min A  Bowel/Bladder  patient will be able to void spontaneously and be continent of B/B with normal patterns  Transfers  Moderate assist bed to  Chair  Locomotion  Min assist WC level at house hold distance  Communication  Min A  Cognition  Min A  Pain  Patient will be free of pain  Safety/Judgment  patient will remain safe and use good judgement while participating in CIR   Therapy Plan: PT Intensity: Minimum of 1-2 x/day ,45 to 90 minutes PT Frequency: 5 out of 7 days PT Duration Estimated Length of Stay: 3 weeks OT Intensity:  Minimum of 1-2 x/day, 45 to 90 minutes OT Frequency: 5 out of 7 days OT Duration/Estimated Length of Stay: ~3 weeks SLP Intensity: Minumum of 1-2 x/day, 30 to 90 minutes SLP Frequency: 3 to 5 out of 7 days SLP Duration/Estimated Length of Stay: 3-3.5 weeks   Due to the current state of emergency, patients may not be receiving their 3-hours of Medicare-mandated therapy.   Team Interventions: Nursing Interventions Patient/Family Education, Pain Management, Dysphagia/Aspiration Precaution Training, Bladder Management, Discharge Planning, Bowel Management, Psychosocial Support, Disease Management/Prevention, Cognitive Remediation/Compensation  PT interventions Balance/vestibular training, Ambulation/gait training, Community reintegration, Cognitive remediation/compensation, Discharge planning, DME/adaptive equipment instruction, Functional mobility training, Pain management, Psychosocial support, Splinting/orthotics, Therapeutic Activities, UE/LE Strength taining/ROM, Visual/perceptual remediation/compensation, Disease management/prevention, Functional electrical stimulation, Neuromuscular re-education, Patient/family education, Skin care/wound management, Stair training, Therapeutic Exercise, UE/LE Coordination activities, Wheelchair propulsion/positioning  OT Interventions Balance/vestibular training, Discharge planning, Functional electrical stimulation, Pain management, Self Care/advanced ADL retraining, Therapeutic Activities, UE/LE Coordination activities, Cognitive remediation/compensation, Disease mangement/prevention, Functional mobility training, Patient/family education, Skin care/wound managment, Therapeutic Exercise, Visual/perceptual remediation/compensation, Community reintegration, Engineer, drilling, Neuromuscular re-education, Psychosocial support, Splinting/orthotics, UE/LE Strength taining/ROM, Wheelchair propulsion/positioning  SLP Interventions Cognitive  remediation/compensation, English as a second language teacher, Dysphagia/aspiration precaution training, Functional tasks, Patient/family education, Therapeutic Activities, Internal/external aids, Environmental controls, Multimodal communication approach, Speech/Language facilitation  TR Interventions    SW/CM Interventions Discharge Planning, Psychosocial Support, Patient/Family Education   Barriers to Discharge MD  Medical stability, Incontinence and Nutritional means  Nursing Incontinence    PT Inaccessible home environment, Decreased caregiver support, Home environment access/layout, Neurogenic Bowel &  Bladder, Wound Care, Lack of/limited family support, Weight, Medication compliance, Behavior    OT Decreased caregiver support    SLP Nutrition means Pt currently NPO with NG tube but MBSS scheduled for 09/11/19 to assess potential for initiating PO diet  SW Home environment access/layout Patient Lives in [place] living facility   Team Discharge Planning: Destination: PT-Skilled [place] (SNF) ,OT- Home , SLP-Home Projected Follow-up: PT-Skilled nursing facility, OT-  Home health OT, Skilled nursing facility, SLP-Home Health SLP, 24 hour supervision/assistance Projected Equipment Needs: PT-To be determined, OT- To be determined, SLP-To be determined Equipment Details: PT- , OT-  Patient/family involved in discharge planning: PT- Patient, Family Midwife,  OT-Patient, Family member/caregiver, SLP-Patient, Family member/caregiver  MD ELOS: 21-25d Medical Rehab Prognosis:  Fair Assessment:  86 year old right-handed female with history of atrial fibrillation not on anticoagulation secondary to prior subdural hematoma, hypertension, hyperlipidemia, CAD, mild cognitive impairment of vascular etiology complicated by alcohol use maintained on Aricept. History taken from chart review. Patient resides independent living facility at [facility] in [place]. Presented 09/02/2019 with right side  weakness and aphasia. EMS noted blood pressure of 180s/90s. Admission chemistry sodium 134, glucose 136, hemoglobin A1c 5.8. Cranial CT scan showed no acute intracranial hemorrhage. 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 Near occlusive thrombus within the proximal left M1 MCA extending into M2 branch. Patient did not receive TPA. Echocardiogram with ejection fraction of 60 to 65% no wall motion abnormalities. Patient did undergo complete recanalization per interventional radiologyand was intubated for a short time. Follow-up head CT 09/06/2019 after revascularization procedure showed petechial hemorrhage at the basal ganglia and small subarachnoid hemorrhage along the sylvian fissure and again scan completed 09/06/2019 showing no evidence of new hemorrhage. Hemorrhagic infarct left putamen stable. Small amount hemorrhage sylvian fissure and left superior temporal lobe anteriorly stable. Currently maintained on aspirin for CVA prophylaxis. Currently n.p.o. with alternative means of nutritional support. EEG suggestive of cortical dysfunction left hemisphere likely secondary to underlying infarct as well as mild to moderate encephalopathy no seizure activity. Hospital course further complicated by bouts of urinary retention post void residual 900 mL  Now requiring 24/7 Rehab RN,MD, as well as CIR level PT, OT and SLP.  Treatment team will focus on ADLs and mobility with goals set at [facility]   See Team Conference Notes for weekly updates to the plan of care

## 2019-09-09 ENCOUNTER — Inpatient Hospital Stay (HOSPITAL_COMMUNITY): Payer: Medicare Other | Admitting: Occupational Therapy

## 2019-09-09 ENCOUNTER — Inpatient Hospital Stay (HOSPITAL_COMMUNITY): Payer: Medicare Other | Admitting: Physical Therapy

## 2019-09-09 ENCOUNTER — Inpatient Hospital Stay (HOSPITAL_COMMUNITY): Payer: Medicare Other | Admitting: Speech Pathology

## 2019-09-09 DIAGNOSIS — R339 Retention of urine, unspecified: Secondary | ICD-10-CM

## 2019-09-09 DIAGNOSIS — I4819 Other persistent atrial fibrillation: Secondary | ICD-10-CM

## 2019-09-09 DIAGNOSIS — I10 Essential (primary) hypertension: Secondary | ICD-10-CM

## 2019-09-09 LAB — URINALYSIS, ROUTINE W REFLEX MICROSCOPIC
Bilirubin Urine: NEGATIVE
Glucose, UA: NEGATIVE mg/dL
Ketones, ur: NEGATIVE mg/dL
Nitrite: NEGATIVE
Protein, ur: 100 mg/dL — AB
RBC / HPF: >50 RBC/hpf — ABNORMAL HIGH (ref 0–5)
Specific Gravity, Urine: 1.024 (ref 1.005–1.030)
WBC, UA: >50 WBC/hpf — ABNORMAL HIGH (ref 0–5)
pH: 5 (ref 5.0–8.0)

## 2019-09-09 LAB — GLUCOSE, CAPILLARY
Glucose-Capillary: 122 mg/dL — ABNORMAL HIGH (ref 70–99)
Glucose-Capillary: 152 mg/dL — ABNORMAL HIGH (ref 70–99)
Glucose-Capillary: 156 mg/dL — ABNORMAL HIGH (ref 70–99)
Glucose-Capillary: 170 mg/dL — ABNORMAL HIGH (ref 70–99)
Glucose-Capillary: 196 mg/dL — ABNORMAL HIGH (ref 70–99)

## 2019-09-09 MED ORDER — AMANTADINE HCL 100 MG PO CAPS
100.0000 mg | ORAL_CAPSULE | Freq: Two times a day (BID) | ORAL | Status: DC
  Administered 2019-09-09 – 2019-09-13 (×3): 100 mg via ORAL
  Filled 2019-09-09 (×5): qty 1

## 2019-09-09 MED ORDER — CEPHALEXIN 250 MG PO CAPS
250.0000 mg | ORAL_CAPSULE | Freq: Three times a day (TID) | ORAL | Status: DC
  Administered 2019-09-09 – 2019-09-19 (×29): 250 mg via ORAL
  Filled 2019-09-09 (×29): qty 1

## 2019-09-09 MED ORDER — ACETAMINOPHEN 160 MG/5ML PO SOLN
650.0000 mg | Freq: Four times a day (QID) | ORAL | Status: DC
  Administered 2019-09-09 – 2019-09-26 (×64): 650 mg
  Filled 2019-09-09 (×65): qty 20.3

## 2019-09-09 MED ORDER — AMANTADINE HCL 50 MG/5ML PO SYRP
100.0000 mg | ORAL_SOLUTION | Freq: Two times a day (BID) | ORAL | Status: DC
  Administered 2019-09-10 – 2019-09-14 (×6): 100 mg
  Filled 2019-09-09 (×11): qty 10

## 2019-09-09 NOTE — Plan of Care (Signed)
  Problem: RH Balance Goal: LTG Patient will maintain dynamic sitting balance (PT) Description: LTG:  Patient will maintain dynamic sitting balance with assistance during mobility activities (PT) Flowsheets (Taken 09/09/2019 0503) LTG: Pt will maintain dynamic sitting balance during mobility activities with:: Supervision/Verbal cueing Goal: LTG Patient will maintain dynamic standing balance (PT) Description: LTG:  Patient will maintain dynamic standing balance with assistance during mobility activities (PT) Flowsheets (Taken 09/09/2019 0503) LTG: Pt will maintain dynamic standing balance during mobility activities with:: Moderate Assistance - Patient 50 - 74%   Problem: Sit to Stand Goal: LTG:  Patient will perform sit to stand with assistance level (PT) Description: LTG:  Patient will perform sit to stand with assistance level (PT) Flowsheets (Taken 09/09/2019 0503) LTG: PT will perform sit to stand in preparation for functional mobility with assistance level: Moderate Assistance - Patient 50 - 74%   Problem: RH Bed Mobility Goal: LTG Patient will perform bed mobility with assist (PT) Description: LTG: Patient will perform bed mobility with assistance, with/without cues (PT). Flowsheets (Taken 09/09/2019 0503) LTG: Pt will perform bed mobility with assistance level of: Moderate Assistance - Patient 50 - 74%   Problem: RH Bed to Chair Transfers Goal: LTG Patient will perform bed/chair transfers w/assist (PT) Description: LTG: Patient will perform bed to chair transfers with assistance (PT). Flowsheets (Taken 09/09/2019 0503) LTG: Pt will perform Bed to Chair Transfers with assistance level: Moderate Assistance - Patient 50 - 74%   Problem: RH Ambulation Goal: LTG Patient will ambulate in controlled environment (PT) Description: LTG: Patient will ambulate in a controlled environment, # of feet with assistance (PT). Flowsheets (Taken 09/09/2019 0503) LTG: Ambulation distance in controlled  environment: 15 ft with PT only to improve awareness of RLE and postural control   Problem: RH Wheelchair Mobility Goal: LTG Patient will propel w/c in controlled environment (PT) Description: LTG: Patient will propel wheelchair in controlled environment, # of feet with assist (PT) Flowsheets (Taken 09/09/2019 0503) LTG: Pt will propel w/c in controlled environ  assist needed:: Minimal Assistance - Patient > 75% LTG: Propel w/c distance in controlled environment: 65ft

## 2019-09-09 NOTE — Progress Notes (Signed)
[facility] PHYSICAL MEDICINE & REHABILITATION PROGRESS NOTE   Subjective/Complaints:  Daughter at bedside. Had questions about bp, frequency of checks. F/u imaging etc. Patient had fairly unremarkable night. Did not indicate any pain.   ROS: limited due to language/communication    Objective:   No results found. Recent Labs    09/07/19 0215 09/08/19 0853  WBC 8.5 10.7*  HGB 12.5 13.1  HCT 37.3 38.8  PLT 225 258   Recent Labs    09/07/19 0215 09/08/19 0853  NA 131* 132*  K 4.0 4.4  CL 96* 95*  CO2 25 26  GLUCOSE 142* 149*  BUN 21 21  CREATININE 0.66 0.76  CALCIUM 9.0 9.2    Intake/Output Summary (Last 24 hours) at 09/09/2019 1151 Last data filed at 09/09/2019 0800 Gross per 24 hour  Intake 625 ml  Output 851 ml  Net -226 ml     Physical Exam: Vital Signs Blood pressure (!) 159/83, pulse 79, temperature 98.3 F (36.8 C), resp. rate 18, height 5\' 5"  (1.651 m), weight 68.5 kg, SpO2 99 %.  Constitutional: No distress . Vital signs reviewed. HEENT: EOMI, oral membranes moist, NGT Neck: supple Cardiovascular: RRR without murmur. No JVD    Respiratory/Chest: CTA Bilaterally without wheezes or rales. Normal effort    GI/Abdomen: BS +, non-tender, non-distended Ext: no clubbing, cyanosis, or edema Psych: pleasant and cooperative Skin: No evidence of breakdown, no evidence of rash Neurologic: expressive>receptive aphasia, weak cough, seems to be managing secretions, motor strength is4/5 in left 0/5 right deltoid, bicep, tricep, grip, hip flexor, knee extensors, ankle dorsiflexor and plantar flexor Sensory exam difficult to assess, decreased but present pain sense RUE/RLE  Musculoskeletal:No joint swelling     Assessment/Plan: 1. Functional deficits secondary to Left BG infarct  which require 3+ hours per day of interdisciplinary therapy in a comprehensive inpatient rehab setting.  Physiatrist is providing close team supervision and 24 hour management of active  medical problems listed below.  Physiatrist and rehab team continue to assess barriers to discharge/monitor patient progress toward functional and medical goals  Care Tool:  Bathing        Body parts bathed by helper: Right arm, Right lower leg, Left arm, Chest, Left lower leg, Face, Abdomen, Front perineal area, Buttocks, Right upper leg, Left upper leg     Bathing assist Assist Level: 2 Helpers     Upper Body Dressing/Undressing Upper body dressing   What is the patient wearing?: Pull over shirt    Upper body assist Assist Level: 2 Helpers    Lower Body Dressing/Undressing Lower body dressing      What is the patient wearing?: Incontinence brief, Pants     Lower body assist Assist for lower body dressing: 2 Helpers     Toileting Toileting    Toileting assist Assist for toileting: 2 Helpers     Transfers Chair/bed transfer  Transfers assist  Chair/bed transfer activity did not occur: Safety/medical concerns (would recommend lift for patient- needed skilled intervention to transfer)  Chair/bed transfer assist level: Total Assistance - Patient < 25%     Locomotion Ambulation   Ambulation assist   Ambulation activity did not occur: Safety/medical concerns          Walk 10 feet activity   Assist  Walk 10 feet activity did not occur: Safety/medical concerns        Walk 50 feet activity   Assist Walk 50 feet with 2 turns activity did not occur: Safety/medical concerns  Walk 150 feet activity   Assist Walk 150 feet activity did not occur: Safety/medical concerns         Walk 10 feet on uneven surface  activity   Assist Walk 10 feet on uneven surfaces activity did not occur: Safety/medical concerns         Wheelchair     Assist Will patient use wheelchair at discharge?: Yes      Wheelchair assist level: Dependent - Patient 0% Max wheelchair distance: 150    Wheelchair 50 feet with 2 turns  activity    Assist        Assist Level: Dependent - Patient 0%   Wheelchair 150 feet activity     Assist      Assist Level: Dependent - Patient 0%   Blood pressure (!) 159/83, pulse 79, temperature 98.3 F (36.8 C), resp. rate 18, height 5\' 5"  (1.651 m), weight 68.5 kg, SpO2 99 %.  Medical Problem List and Plan: 1.Right side weakness and aphasiasecondary to left MCA territory infarction status post IR revascularization with SAH -patient may Shower if has good enough sitting balance -ELOS/Goals: 3-4 weeks- goals min A hopefully 2. Antithrombotics: -DVT/anticoagulation:SCDs- had petechial hemrrhages -antiplatelet therapy: Aspirin 81 mg daily 3. Pain Management:will schedule  Tylenol q6 hours through NGT 4. Mood:Amantadine 100 mg daily, Zoloft 50 mg daily, Aricept 10 mg nightly-  -add 1200 dose of amantadine-->BID   -consider ritalin trial -antipsychotic agents: N/A 5. Neuropsych: This patientisNOTcapable of making decisions on herown behalf. 6. Skin/Wound Care:Routine skin checks 7. Fluids/Electrolytes/Nutrition:Routine in and outs with follow-up chemistries 8. Dysphagia. Tube feeds as directed. Dietary follow-up. Speech therapy follow-up- has Cortrak- assess if things improves, or if needs PEG long term  -given appearance might have longer term dysphagia,  9. Hypertension. Lopressor 50 mg twice daily, Cozaar 25 mg daily, Norvasc 10 mg daily.  Vitals:   09/08/19 1913 09/09/19 0355  BP: (!) 150/61 (!) 159/83  Pulse: 71 79  Resp: 18 18  Temp: 97.6 F (36.4 C) 98.3 F (36.8 C)  SpO2: 98% 99%   8/21 bp's borderline. Avoid over-treating bp at this point 10. Hyperlipidemia. Lipitor 11. History of alcohol use. Provide counseling with patient and family 11. Atrial fibrillation. No anticoagulation at this time.   Continue beta-blocker  -HR controlled 8/21 13. Urinary retention- continue foley  since significant retention- cannot take flomax with Cortrak.   Consider voiding trial as she's more mobile 14. Sleep-/insomnia- pt took 30, not 3 mg of Melatonin nightly at home AND 3+ pills of ZZZquil at home to sleep nightly per daughter- FYI    LOS: 2 days A FACE TO FACE EVALUATION WAS PERFORMED  Meredith Staggers 09/09/2019, 11:51 AM

## 2019-09-09 NOTE — Progress Notes (Signed)
Speech Language Pathology Daily Session Note  Patient Details  Name: [name] MRN: [id number] Date of Birth: [birth year]  Today's Date: 09/09/2019 SLP Individual Time: 9030-0923 SLP Individual Time Calculation (min): 45 min  Short Term Goals: Week 1: SLP Short Term Goal 1 (Week 1): Pt will participate in PO trials of ice, thin H2O, and puree at bedside to demonstrate readiness for MBSS prior to initiation of PO diet. SLP Short Term Goal 2 (Week 1): Pt will communicate basic wants and needs at the word level with Max A verbal and/or visual cues. SLP Short Term Goal 3 (Week 1): Pt will name common objects with 60% accuracy provided Max A multimodal cues. SLP Short Term Goal 4 (Week 1): Pt will answer basic yes/no questions to in response to wants/needs and basic biographical information with 60% accuracy with Max A mulimodal cues. SLP Short Term Goal 5 (Week 1): Pt will demonstrate awareness of functional and/or verbal errors with Max A multimodal cues. SLP Short Term Goal 6 (Week 1): Pt will sustain attention to basic functional tasks or 10 miunte intervals with Mod A multimodal cues for redirection.  Skilled Therapeutic Interventions:   Patient seen with daughter present in room, for skilled ST treatment focusing dysphagia and aphasia goals. Patient was alert for entire session and participated fully. She was able to perform some of her oral care and was observed to brush on both sides without cues. After oral care, SLP observed her with small pieces of ice chips and water sips via spoon. She exhibited active mastication with ice chips and oral phase was appropriate for both ice chips and water sips. It was difficult to palpate consistent swallow initiation as patient was moving head around frequently. She did exhibit what appeared to be an incomplete swallow or perhaps laryngeal pumping. Delayed, sometimes immediate cough responses were observed and this is likely due to aspiration in absence  of a complete swallow. MBS already planned for 8/23. SLP used hand over hand for patient to achieve finger localization for pointing (daughter did report her arthritis does affect this as well). Patient was able to perform a meaningful point and tapping of object pictures in field of two but pointed to pictures on left side 100% of the time. Limited verbal production with some automatic speech at word level observed, "okay", etc. Patient continues to benefit from skilled ST tx for global aphasia, dysphagia goals prior to discharge.  Pain Pain Assessment Pain Scale: Faces Faces Pain Scale: No hurt  Therapy/Group: Individual Therapy  Sonia Baller, MA, CCC-SLP 09/09/19 3:55 PM

## 2019-09-09 NOTE — Progress Notes (Addendum)
Patients abdomen noted to be distended during AM assessment, minimal output noted in drainage bag. MD updated, ok to irrigiate. Unable to irrigate d/t plugging. Foley removed and new 16FR foley inserted after thorough peri care completed. 1240ml dark amber urine immediately returned.   Hematuria and large amount of mucus noted in foley bag. MD updated. Foley d/c'd at 62. Will monitor voiding.

## 2019-09-09 NOTE — Progress Notes (Signed)
Occupational Therapy Session Note  Patient Details  Name: [name] MRN: [id number] Date of Birth: May 24, 1932  Today's Date: 09/09/2019 OT Individual Time: 0092-3300 OT Individual Time Calculation (min): 74 min   Short Term Goals: Week 1:  OT Short Term Goal 1 (Week 1): Pt will sit EOB statically for ~5 min with supervison in prep for ADL task OT Short Term Goal 2 (Week 1): Pt will attend to familar ADL task demonstrating sustained attention for ~3 min with min cues OT Short Term Goal 3 (Week 1): Pt will don shirt with mod A OT Short Term Goal 4 (Week 1): Pt will in bed with mod A in prep for aDL tasks  Skilled Therapeutic Interventions/Progress Updates:    Pt greeted in bed with no c/o pain, appearing alert with daughter [name] at bedside. Pt agreeable to engage in bathing/dressing tasks during session, starting with LB self care bedlevel. Pt requiring Total A to obtain reclined figure 4 bilaterally during tasks, often moving unaffected LE out of position due to apraxia/poor attention to task. Focused on pt following 1 step instruction with [name] for initiation and also attending to Rt hemibody. Pt rolling Rt>Lt with Max A while 2nd helper completed perihygiene post bowel incontinence in brief. Pt reported her brief was clean beforehand. Note that pt had a 2nd bout of bowel incontinence in the middle of perihygiene, large watery BM and needing hygiene completed a 2nd time. Total A x2 for donning pants pt rolling Rt>Lt. +2 for supine<sit with pt exhibiting strong posterior bias and pushing tendencies with the Lt hand. Pt required Total A for sitting balance while 2nd helper braced pts legs and assisted with bathing tasks and donning bra, pt visibly more lethargic at this point. Pt returned to bed to don overhead shirt, again requiring +2 assistance. +2 for boosting up in bed, pt left with Rt UE elevated for edema mgt, initiated education with [name] regarding hand PROM. Pt remained in bed in  care of RN at end of session.      Therapy Documentation Precautions:  Precautions Precautions: Fall Precaution Comments: corerack; R inattention Restrictions Weight Bearing Restrictions: No Pain: no s/s pain during tx   ADL: ADL Eating: NPO Grooming: Dependent Upper Body Bathing: Dependent Lower Body Bathing: Dependent Upper Body Dressing: Dependent Lower Body Dressing: Dependent Toileting: Dependent Toilet Transfer: Not assessed Tub/Shower Transfer: Not assessed     Therapy/Group: Individual Therapy  Dietrich Samuelson A Cori Justus 09/09/2019, 12:25 PM

## 2019-09-09 NOTE — Progress Notes (Signed)
Physical Therapy Session Note  Patient Details  Name: [name] MRN: [id number] Date of Birth: 16-Jul-1932  Today's Date: 09/09/2019 PT Individual Time: 5462-7035 PT Individual Time Calculation (min): 39 min   and  Today's Date: 09/09/2019 PT Missed Time: 21 Minutes Missed Time Reason: Nursing care  Short Term Goals: Week 1:  PT Short Term Goal 1 (Week 1): Pt will maintain sitting balance EOB with mod assist up to 5 minutes PT Short Term Goal 2 (Week 1): Pt will initiate gait training PT Short Term Goal 3 (Week 1): Pt will demonstrate ability to remain aroused and engaged in therapy for >75% of session PT Short Term Goal 4 (Week 1): Pt will perform bed mobility with max assist  Skilled Therapeutic Interventions/Progress Updates:    Pt received in care of RN and NT with them assisting pt with peri-care and foley management. Missed 21 minutes of skilled physical therapy.  Nursing staff notified therapist once finished assisting pt. Pt received supine in bed in a deep sleep with her daughter, [name], present. [name] very motivating and encouraging to patient throughout session. Pt difficulty to arouse in supine only opening eyes for ~1-2 seconds then returning to sleep. Supine>sit R EOB, HOB partially elevated, with +2 total assist for B LE management and trunk upright. Once sitting EOB required total assist, and +2 intermittent assist for improved positioning, to maintain trunk upright with therapist and pt's daughter providing tactile and verbal cuing to increase alertness. Pt tolerated sitting on EOB for ~74minutes working on increased alertness and then once pt able to keep eyes open worked on trunk control/sitting balance due to strong R and posterior lean/LOB - continues to require at least min assist and up to total assist to maintain upright - pt's daughter providing increased stimulus with a video and music on her phone and utilizing personal items in the room to capture pt's  attention with pt grinning to these items but nonverbal throughout - therapist facilitating increased anterior trunk lean and manually facilitation R UE WBing with cuing for L hand placement on footboard to assist with trunk support. Pt continues to remain very lethargic though requiring continuous stimulus to stay alert and sometimes still falling asleep. Sit>supine +2 total assist for trunk descent and B LE management into the bed. +2 total assist for repositioning in the bed. Therapist educated pt's daughter on R hemibody positioning in the bed. Pt left supine with needs in reach, R hemibody therapeutically positioned with pillows, lines intact, and bed alarm on.  Therapy Documentation Precautions:  Precautions Precautions: Fall Precaution Comments: corerack; R inattention Restrictions Weight Bearing Restrictions: No  Pain:   No instances of grimacing or indications of discomfort noticed during session.   Therapy/Group: Individual Therapy  Tawana Scale , PT, DPT, CSRS  09/09/2019, 12:32 PM

## 2019-09-10 LAB — GLUCOSE, CAPILLARY
Glucose-Capillary: 142 mg/dL — ABNORMAL HIGH (ref 70–99)
Glucose-Capillary: 144 mg/dL — ABNORMAL HIGH (ref 70–99)
Glucose-Capillary: 150 mg/dL — ABNORMAL HIGH (ref 70–99)
Glucose-Capillary: 155 mg/dL — ABNORMAL HIGH (ref 70–99)
Glucose-Capillary: 167 mg/dL — ABNORMAL HIGH (ref 70–99)
Glucose-Capillary: 182 mg/dL — ABNORMAL HIGH (ref 70–99)

## 2019-09-10 MED ORDER — BETHANECHOL CHLORIDE 10 MG PO TABS
10.0000 mg | ORAL_TABLET | Freq: Three times a day (TID) | ORAL | Status: DC
  Administered 2019-09-10 – 2019-09-11 (×5): 10 mg via ORAL
  Filled 2019-09-10 (×6): qty 1

## 2019-09-10 NOTE — Progress Notes (Signed)
PHYSICAL MEDICINE & REHABILITATION PROGRESS NOTE   Subjective/Complaints:  Daughter at bedside. Had questions about bp, frequency of checks. F/u imaging etc. Patient had fairly unremarkable night. Did not indicate any pain.   ROS: limited due to language/communication    Objective:   No results found. Recent Labs    09/08/19 0853  WBC 10.7*  HGB 13.1  HCT 38.8  PLT 258   Recent Labs    09/08/19 0853  NA 132*  K 4.4  CL 95*  CO2 26  GLUCOSE 149*  BUN 21  CREATININE 0.76  CALCIUM 9.2    Intake/Output Summary (Last 24 hours) at 09/10/2019 1152 Last data filed at 09/10/2019 0604 Gross per 24 hour  Intake 780 ml  Output 3050 ml  Net -2270 ml     Physical Exam: Vital Signs Blood pressure 133/77, pulse 83, temperature 98 F (36.7 C), resp. rate 18, height 5\' 5"  (1.651 m), weight 68.5 kg, SpO2 97 %.  Constitutional: No distress . Vital signs reviewed. HEENT: EOMI, oral membranes moist, NGT in place Neck: supple Cardiovascular: RRR Respiratory/Chest: CTA Bilaterally with scattered wheezes and upper airway sounds from secretions.     GI/Abdomen: BS +, non-tender, non-distended Ext: no clubbing, cyanosis, or edema Psych: pleasant and cooperative Skin: No evidence of breakdown, no evidence of rash Neurologic: slow to arouse this morning.  ongoing expressive>receptive aphasia, weak cough/voice, motor strength is4/5 in left 0/5 right deltoid, bicep, tricep, grip, hip flexor, knee extensors, ankle dorsiflexor and plantar flexor Sensory exam difficult to assess, decreased but present pain sense RUE/RLE  Musculoskeletal:No joint swelling     Assessment/Plan: 1. Functional deficits secondary to Left BG infarct  which require 3+ hours per day of interdisciplinary therapy in a comprehensive inpatient rehab setting.  Physiatrist is providing close team supervision and 24 hour management of active medical problems listed below.  Physiatrist and rehab team  continue to assess barriers to discharge/monitor patient progress toward functional and medical goals  Care Tool:  Bathing        Body parts bathed by helper: Right arm, Right lower leg, Left arm, Chest, Left lower leg, Face, Abdomen, Front perineal area, Buttocks, Right upper leg, Left upper leg     Bathing assist Assist Level: 2 Helpers     Upper Body Dressing/Undressing Upper body dressing   What is the patient wearing?: Pull over shirt    Upper body assist Assist Level: Dependent - Patient 0%    Lower Body Dressing/Undressing Lower body dressing      What is the patient wearing?: Incontinence brief     Lower body assist Assist for lower body dressing: 2 Helpers     Toileting Toileting    Toileting assist Assist for toileting: 2 Helpers     Transfers Chair/bed transfer  Transfers assist  Chair/bed transfer activity did not occur: Safety/medical concerns (would recommend lift for patient- needed skilled intervention to transfer)  Chair/bed transfer assist level: Total Assistance - Patient < 25%     Locomotion Ambulation   Ambulation assist   Ambulation activity did not occur: Safety/medical concerns          Walk 10 feet activity   Assist  Walk 10 feet activity did not occur: Safety/medical concerns        Walk 50 feet activity   Assist Walk 50 feet with 2 turns activity did not occur: Safety/medical concerns         Walk 150 feet activity   Assist Walk  150 feet activity did not occur: Safety/medical concerns         Walk 10 feet on uneven surface  activity   Assist Walk 10 feet on uneven surfaces activity did not occur: Safety/medical concerns         Wheelchair     Assist Will patient use wheelchair at discharge?: Yes      Wheelchair assist level: Dependent - Patient 0% Max wheelchair distance: 150    Wheelchair 50 feet with 2 turns activity    Assist        Assist Level: Dependent - Patient 0%    Wheelchair 150 feet activity     Assist      Assist Level: Dependent - Patient 0%   Blood pressure 133/77, pulse 83, temperature 98 F (36.7 C), resp. rate 18, height 5\' 5"  (1.651 m), weight 68.5 kg, SpO2 97 %.  Medical Problem List and Plan: 1.Right side weakness and aphasiasecondary to left MCA territory infarction status post IR revascularization with SA[facility] -patient may Shower if has good enough sitting balance -ELOS/Goals: 3-4 weeks- goals min A hopefully  -spent time reviewing meds, course with daughter at bedside 2. Antithrombotics: -DVT/anticoagulation:SCDs- had petechial hemrrhages -antiplatelet therapy: Aspirin 81 mg daily 3. Pain Management:will schedule  Tylenol q6 hours through NGT 4. Mood:Amantadine 100 mg daily, Zoloft 50 mg daily, Aricept 10 mg nightly-  -added 1200 dose of amantadine-->BID on 8/21  -consider ritalin trial -antipsychotic agents: N/A 5. Neuropsych: This patientisNOTcapable of making decisions on herown behalf. 6. Skin/Wound Care:Routine skin checks 7. Fluids/Electrolytes/Nutrition:Routine in and outs with follow-up chemistries 8. Dysphagia. Tube feeds as directed. Dietary follow-up. Speech therapy follow-up- has Cortrak- assess if things improves, or if needs PEG long term  -given appearance might have longer term dysphagia,   -have asked RT for vest chest PT to help her mobilize secretions in chest in the short term.  9. Hypertension. Lopressor 50 mg twice daily, Cozaar 25 mg daily, Norvasc 10 mg daily.  Vitals:   09/09/19 1952 09/10/19 0411  BP: 125/61 133/77  Pulse: 69 83  Resp: 18 18  Temp: 98 F (36.7 C)   SpO2: 96% 97%   8/22 bp's improved 10. Hyperlipidemia. Lipitor 11. History of alcohol use. Provide counseling with patient and family 28. Atrial fibrillation. No anticoagulation at this time.   Continue beta-blocker  -HR controlled 8/21 13. Urinary  retention/proteus UTI  -keflex 250mg  TID started 8/21  -f/u cx pending  -req I/O caths, volumes 350-450 cc overnight into this morning  -will try low dose urecholine to help with emptying  -purewick if needed to keep skin dry, but mostly retaining right now 14. Sleep-/insomnia- pt took 30, not 3 mg of Melatonin nightly at home AND 3+ pills of ZZZquil at home to sleep nightly per daughter- FYI    LOS: 3 days A FACE TO [place] 09/10/2019, 11:52 AM

## 2019-09-10 NOTE — Progress Notes (Addendum)
Pt receiving tube feeding though coretrak with HOB >30, no indication of misplacement. Pt has wet wheeze and weak cough that doesn't clear out secretions from chest. Have oral suctioned x3 overnight without results as is too deep in chest.   @0328  0.1mg  Robinul given IV for increased secretions. Will continue to monitor. Breath sounds diminished in lower lobes, but overall no signs of respiratory distress. Respiratory paged for second opinion.    @0404  Respiratory oral suctioned pt as well with weak cough reflex. Said order for ETT PRN would be next possible step and to call if got worse. At this time no other interventions, Robinul might be helping slightly and repositioned pt again to sit up closer to [facility] 40.

## 2019-09-10 NOTE — Progress Notes (Signed)
Patient tolerating tube feeds/flushes well. Continues with oral secretions and cough, PRN Robinul given with good effect. No spontaneous void this shift. Straight cath done at 1230, 566ml clear yellow urine emptied. Small soft BM this shift. Daughter at bedside all shift.

## 2019-09-10 NOTE — Progress Notes (Signed)
Chaplain visited with patient and her daughter.  Chaplain established a relationship of care and concern and held a space for daughter's sharing of her mother's journey.  Chaplain prayed at bedside with patient and daughter.    Chaplain will continue to follow up with this patient as well as being available for further support as requested.    [place]

## 2019-09-11 ENCOUNTER — Inpatient Hospital Stay (HOSPITAL_COMMUNITY): Payer: Medicare Other | Admitting: Physical Therapy

## 2019-09-11 ENCOUNTER — Inpatient Hospital Stay (HOSPITAL_COMMUNITY): Payer: Medicare Other

## 2019-09-11 ENCOUNTER — Encounter (HOSPITAL_COMMUNITY): Payer: Medicare Other | Admitting: Speech Pathology

## 2019-09-11 ENCOUNTER — Inpatient Hospital Stay (HOSPITAL_COMMUNITY): Payer: Medicare Other | Admitting: Occupational Therapy

## 2019-09-11 LAB — GLUCOSE, CAPILLARY
Glucose-Capillary: 109 mg/dL — ABNORMAL HIGH (ref 70–99)
Glucose-Capillary: 110 mg/dL — ABNORMAL HIGH (ref 70–99)
Glucose-Capillary: 111 mg/dL — ABNORMAL HIGH (ref 70–99)
Glucose-Capillary: 131 mg/dL — ABNORMAL HIGH (ref 70–99)
Glucose-Capillary: 138 mg/dL — ABNORMAL HIGH (ref 70–99)

## 2019-09-11 NOTE — Progress Notes (Signed)
Occupational Therapy Session Note  Patient Details  Name: [name] MRN: [id number] Date of Birth: April 04, 1932  Today's Date: 09/11/2019 OT Individual Time: 1102-1200 OT Individual Time Calculation (min): 58 min   OT Individual Time: 1415-1500 OT Individual Time Calculation (min): 45 min    Short Term Goals: Week 1:  OT Short Term Goal 1 (Week 1): Pt will sit EOB statically for ~5 min with supervison in prep for ADL task OT Short Term Goal 2 (Week 1): Pt will attend to familar ADL task demonstrating sustained attention for ~3 min with min cues OT Short Term Goal 3 (Week 1): Pt will don shirt with mod A OT Short Term Goal 4 (Week 1): Pt will in bed with mod A in prep for aDL tasks  Skilled Therapeutic Interventions/Progress Updates:  Session 1: Patient met lying supine in bed in agreement with OT treatment session with focus on bed mobility, self-care re-education, functional tranfers, and command following in prep for functional tasks as detailed below. Total A +2 for supine to EOB. Patient able to maintain sitting balance at EOB for ~3-19mn with Max A progressing to Mod A. UB dressing seated EOB with Total A and additional assist to maintain sitting balance. LB dressing seated EOB with utilization of lateral leans R<>L to encourage weight bearing through RUE. Squat-pivot transfer to TIS wc with total A +2. Seated at sink level, patient engaged completed face washing with Max A and hand over hand assist and oral hygiene with Max A and use of suction toothbrush. Session concluded with patient seated in TIS with call bell within reach, belt alarm activated, and all needs met. Daughter present at bedside throughout treatment session.   Session 2: Afternoon session with focus on supported sitting balance for hair washing task at sink level. Session limited by fatigue from activities of the day. Patient able to follow 1-step verbal commands with >50% accuracy. OT provided education on importance  of sitting on patients R to facilitate attention to R visual field, importance of maintaining integrity of R shoulder joint to preserve function and decrease risk of subluxation, and supporting UE on pillow seated in TIS wc and in supine. Daughter wanting to be an active part of patients rehab stay. Session concluded with patient seated in TIS wc with call bell within reach, belt alarm activated, and all needs met.   Therapy Documentation Precautions:  Precautions Precautions: Fall Precaution Comments: corerack; R inattention Restrictions Weight Bearing Restrictions: No General:    Therapy/Group: Individual Therapy  [name] [name] 09/11/2019, 7:34 AM

## 2019-09-11 NOTE — Progress Notes (Signed)
Physical Therapy Session Note  Patient Details  Name: [name] MRN: [id number] Date of Birth: 1932-07-01  Today's Date: 09/11/2019 PT Individual Time: 1300-1339 PT Individual Time Calculation (min): 39 min   Short Term Goals: Week 1:  PT Short Term Goal 1 (Week 1): Pt will maintain sitting balance EOB with mod assist up to 5 minutes PT Short Term Goal 2 (Week 1): Pt will initiate gait training PT Short Term Goal 3 (Week 1): Pt will demonstrate ability to remain aroused and engaged in therapy for >75% of session PT Short Term Goal 4 (Week 1): Pt will perform bed mobility with max assist  Skilled Therapeutic Interventions/Progress Updates:   Received pt semi-reclined in TIS [facility], pt's daughter present at bedside stating pt has been up in chair since around 11am. Pt continues to remain non verbal and extremely lethargic throughout session only opening eyes for approximately 10-15 seconds at a time. Pt transported to dayroom in [facility] total A and transferred TIS WC<>mat squat<>pivot total A +2. Pt required maximal encouragement and cues to participate in transfer, however with poor carry over and pt with little participation. Once pt sitting on mat pt slightly more alert at first looking around room but easily distracted by other people and with increased difficulty sustaining attention to task and following commands. Pt worked on dynamic sitting balance and trunk control on mat ~15 minutes with min A fading to max A as pt falling in/out of sleep and with posterior and R lateral lean. Attempted to engage in seated horseshoe toss however pt unable to follow instructions to reach for horseshoes despite cues and increased time. However, when horseshoe placed in pt's lap pt was able to grasp using L UE, but ultimately unable to throw horseshoe to target.Therapist manually facilitated R UE placement on mat for improved weight bearing however pt unable to maintain position. Transferred mat<>TIS via  squat<>pivot total A +2 with intention to position pt so she would be less distracted, however pt completly falling asleep. Pt transported back to room in TIS WC total A. Concluded session with pt semi-reclined in TIS WC, needs within reach, and seatbelt alarm on. Pillow placed under R UE for improved hemibody positioning. Updated pt's daughter on pt's CLOF.   Therapy Documentation Precautions:  Precautions Precautions: Fall Precaution Comments: corerack; R inattention Restrictions Weight Bearing Restrictions: No  Therapy/Group: Individual Therapy Alfonse Alpers PT, DPT   09/11/2019, 7:36 AM

## 2019-09-11 NOTE — Progress Notes (Signed)
Modified barium swallow completed this morning. Pt upgraded from NPO. Writer gave 139ml NTL fluids. Patient tolerated well. Daughter at bedside and updated with changes.

## 2019-09-11 NOTE — Progress Notes (Signed)
Modified Barium Swallow Progress Note  Patient Details  Name: [name] MRN: [id number] Date of Birth: 1932/12/03  Today's Date: 09/11/2019  Modified Barium Swallow completed.  Full report located under Chart Review in the Imaging Section.  Brief recommendations include the following:  Clinical Impression  Pt presents with mild-mod oropharyngeal dysphagia s/p acute CVA and deconditioning. Pt's oral phase was most remarkable for overall weak lingual manipulation of boluses, decreased bolus cohesion, and prolonged mastication of solids. Thin barium trials were administered via cup and straw. Left and right anterior loss of thin boluses noted with consumption from cup. Pt's anterior laryngeal mobility is limited and incomplete laryngeal vestibule closure noted; pt's epiglottis does not achieve full inversion across consistencies, and suspect this is exacerbated by present of Cortrak NG tube. As a result, thin barium was both penetrated on some occasions and aspirated on others, with a delayed cough response, which was ineffective to clear aspirates from the trachea (PAS score 7). Nectar barium was deglutated with only one instance of penetration into the laryngeal vestibule, which remained above the vocal folds and appeared to be ejected with a subsequent reflexive swallow triggered by patient (PAS score 2). Pureed solids were consumed with timely and effective oral transit and clearance, and no airway intrusion noted. Pt also demonstrated ability to follow verbal commends to perform an extra dry swallow throughout intake, to assist with partial clearance of mild vallecular residue of solids and liquids. Would recommend pt initiate a puree texture solid diet, nectar thick liquids (straws okay and should be used to minimize anterior loss), medications crushed in puree, cues for following solids with liquids and intermittent extra dry swallow. Please provide full supervision during meals to ensure safe  positioning and use of swallow precautions/strategies.   Swallow Evaluation Recommendations       SLP Diet Recommendations: Dysphagia 1 (Puree) solids;Nectar thick liquid       Medication Administration: Crushed with puree   Supervision: Patient able to self feed;Full supervision/cueing for compensatory strategies (may need some assist with self-feeding, but allow her to feed as much as she can)   Compensations: Minimize environmental distractions;Slow rate;Small sips/bites;Monitor for anterior loss;Multiple dry swallows after each bite/sip;Follow solids with liquid   Postural Changes: Remain semi-upright after after feeds/meals (Comment);Seated upright at 90 degrees   Oral Care Recommendations: Oral care BID   Other Recommendations: Have oral suction available;Order thickener from pharmacy;Prohibited food (jello, ice cream, thin soups);Remove water pitcher    Arbutus Leas 09/11/2019,9:52 AM

## 2019-09-11 NOTE — Progress Notes (Signed)
RT has attempted to complete CPT x 3 this morning without success due to the patient not being available each time I came by.

## 2019-09-11 NOTE — Progress Notes (Signed)
[facility] PHYSICAL MEDICINE & REHABILITATION PROGRESS NOTE   Subjective/Complaints:  Cleared for po D1 Nectar based on MBS  ROS: limited due to language/communication    Objective:   No results found. No results for input(s): WBC, HGB, HCT, PLT in the last 72 hours. No results for input(s): NA, K, CL, CO2, GLUCOSE, BUN, CREATININE, CALCIUM in the last 72 hours.  Intake/Output Summary (Last 24 hours) at 09/11/2019 0857 Last data filed at 09/11/2019 0530 Gross per 24 hour  Intake --  Output 1750 ml  Net -1750 ml     Physical Exam: Vital Signs Blood pressure 118/68, pulse 63, temperature (!) 97.3 F (36.3 C), temperature source Oral, resp. rate 17, height 5\' 5"  (1.651 m), weight 77.9 kg, SpO2 95 %.   General: No acute distress Mood and affect are appropriate Heart: Regular rate and rhythm no rubs murmurs or extra sounds Lungs: Clear to auscultation, breathing unlabored, no rales or wheezes Abdomen: Positive bowel sounds, soft nontender to palpation, nondistended Extremities: No clubbing, cyanosis, or edema Skin: No evidence of breakdown, no evidence of rash   Neurologic: slow to arouse this morning.  ongoing expressive>receptive aphasia,min verbal attempts, raises R Left arm to command but would not protrude tongue , started to move finger towards nose but did not complete the task  weak cough/voice, motor strength is4/5 in left 0/5 right deltoid, bicep, tricep, grip, hip flexor, knee extensors, ankle dorsiflexor and plantar flexor Sensory exam difficult to assess, decreased but present pain sense RUE Musculoskeletal:No joint swelling     Assessment/Plan: 1. Functional deficits secondary to Left BG infarct  which require 3+ hours per day of interdisciplinary therapy in a comprehensive inpatient rehab setting.  Physiatrist is providing close team supervision and 24 hour management of active medical problems listed below.  Physiatrist and rehab team continue to assess  barriers to discharge/monitor patient progress toward functional and medical goals  Care Tool:  Bathing        Body parts bathed by helper: Right arm, Right lower leg, Left arm, Chest, Left lower leg, Face, Abdomen, Front perineal area, Buttocks, Right upper leg, Left upper leg     Bathing assist Assist Level: 2 Helpers     Upper Body Dressing/Undressing Upper body dressing   What is the patient wearing?: Pull over shirt    Upper body assist Assist Level: Dependent - Patient 0%    Lower Body Dressing/Undressing Lower body dressing      What is the patient wearing?: Incontinence brief     Lower body assist Assist for lower body dressing: 2 Helpers     Toileting Toileting    Toileting assist Assist for toileting: Dependent - Patient 0% (cath )     Transfers Chair/bed transfer  Transfers assist  Chair/bed transfer activity did not occur: Safety/medical concerns (would recommend lift for patient- needed skilled intervention to transfer)  Chair/bed transfer assist level: Total Assistance - Patient < 25%     Locomotion Ambulation   Ambulation assist   Ambulation activity did not occur: Safety/medical concerns          Walk 10 feet activity   Assist  Walk 10 feet activity did not occur: Safety/medical concerns        Walk 50 feet activity   Assist Walk 50 feet with 2 turns activity did not occur: Safety/medical concerns         Walk 150 feet activity   Assist Walk 150 feet activity did not occur: Safety/medical concerns  Walk 10 feet on uneven surface  activity   Assist Walk 10 feet on uneven surfaces activity did not occur: Safety/medical concerns         Wheelchair     Assist Will patient use wheelchair at discharge?: Yes      Wheelchair assist level: Dependent - Patient 0% Max wheelchair distance: 150    Wheelchair 50 feet with 2 turns activity    Assist        Assist Level: Dependent - Patient 0%    Wheelchair 150 feet activity     Assist      Assist Level: Dependent - Patient 0%   Blood pressure 118/68, pulse 63, temperature (!) 97.3 F (36.3 C), temperature source Oral, resp. rate 17, height 5\' 5"  (1.651 m), weight 77.9 kg, SpO2 95 %.  Medical Problem List and Plan: 1.Right side weakness and aphasiasecondary to left MCA territory infarction status post IR revascularization with SAH -patient may Shower if has good enough sitting balance -ELOS/Goals: 3-4 weeks- goals min A hopefully  -spent time reviewing meds, course with daughter at bedside 2. Antithrombotics: -DVT/anticoagulation:SCDs- had petechial hemrrhages -antiplatelet therapy: Aspirin 81 mg daily 3. Pain Management:will schedule  Tylenol q6 hours through NGT 4. Mood:Amantadine 100 mg daily, Zoloft 50 mg daily, Aricept 10 mg nightly-  -added 1200 dose of amantadine-->BID on 8/21  -consider ritalin trial if no sig increase in LOA with amantadine, f/u in conf -antipsychotic agents: N/A 5. Neuropsych: This patientisNOTcapable of making decisions on herown behalf. 6. Skin/Wound Care:Routine skin checks 7. Fluids/Electrolytes/Nutrition:Routine in and outs with follow-up chemistries 8. Dysphagia. Tube feeds as directed. Dietary follow-up. Speech therapy follow-up- has Cortrak- assess if things improves, or if needs PEG long term  -monitor intake as well as S/Sx of asp on po   -have asked RT for vest chest PT to help her mobilize secretions in chest in the short term.  9. Hypertension. Lopressor 50 mg twice daily, Cozaar 25 mg daily, Norvasc 10 mg daily.  Vitals:   09/10/19 2101 09/11/19 0359  BP: 108/62 118/68  Pulse: 71 63  Resp:  17  Temp:  (!) 97.3 F (36.3 C)  SpO2:  95%   8/22 bp's improved 10. Hyperlipidemia. Lipitor 11. History of alcohol use. Provide counseling with patient and family 86. Atrial fibrillation. No anticoagulation at  this time.   Continue beta-blocker  -HR controlled 8/21 13. Urinary retention/proteus UTI  -keflex 250mg  TID started 8/21  -cx demonstrates >100K proteus S to keflex   -req I/O caths, volumes 350-450 cc overnight into this morning  -will try low dose urecholine to help with emptying  -purewick if needed to keep skin dry, but mostly retaining right now 14. Sleep-/insomnia- pt took 30, not 3 mg of Melatonin nightly at home AND 3+ pills of ZZZquil at home to sleep nightly per daughter- FYI    LOS: 4 days A FACE TO [place] E Martie Muhlbauer 09/11/2019, 8:57 AM

## 2019-09-12 ENCOUNTER — Inpatient Hospital Stay (HOSPITAL_COMMUNITY): Payer: Medicare Other | Admitting: Speech Pathology

## 2019-09-12 ENCOUNTER — Inpatient Hospital Stay (HOSPITAL_COMMUNITY): Payer: Medicare Other | Admitting: Occupational Therapy

## 2019-09-12 ENCOUNTER — Inpatient Hospital Stay (HOSPITAL_COMMUNITY): Payer: Medicare Other | Admitting: *Deleted

## 2019-09-12 LAB — URINE CULTURE: Culture: >=100000 — AB

## 2019-09-12 LAB — GLUCOSE, CAPILLARY
Glucose-Capillary: 110 mg/dL — ABNORMAL HIGH (ref 70–99)
Glucose-Capillary: 113 mg/dL — ABNORMAL HIGH (ref 70–99)
Glucose-Capillary: 125 mg/dL — ABNORMAL HIGH (ref 70–99)
Glucose-Capillary: 135 mg/dL — ABNORMAL HIGH (ref 70–99)
Glucose-Capillary: 148 mg/dL — ABNORMAL HIGH (ref 70–99)

## 2019-09-12 MED ORDER — OSMOLITE 1.2 CAL PO LIQD: 650.0000 mL | ORAL | Status: DC

## 2019-09-12 MED ORDER — FREE WATER
200.0000 mL | Freq: Four times a day (QID) | Status: DC
  Administered 2019-09-12 – 2019-09-26 (×51): 200 mL

## 2019-09-12 MED ORDER — OSMOLITE 1.2 CAL PO LIQD
650.0000 mL | ORAL | Status: DC
  Administered 2019-09-12 – 2019-09-17 (×6): 650 mL
  Filled 2019-09-12: qty 1000
  Filled 2019-09-12: qty 711
  Filled 2019-09-12 (×4): qty 1000
  Filled 2019-09-12: qty 711

## 2019-09-12 MED ORDER — BETHANECHOL CHLORIDE 25 MG PO TABS
25.0000 mg | ORAL_TABLET | Freq: Three times a day (TID) | ORAL | Status: DC
  Administered 2019-09-12 – 2019-09-26 (×42): 25 mg via ORAL
  Filled 2019-09-12 (×43): qty 1

## 2019-09-12 NOTE — Progress Notes (Signed)
Nutrition Follow-up  DOCUMENTATION CODES:   Not applicable  INTERVENTION:   Nocturnal tube feeding via Cortrak: - Osmolite 1.2 @ 65 ml/hr to run for 10 hours from 2000 to 0600 - ProSource TF 45 ml TID - Free water flushes of 200 ml q 6 hours  Nocturnal tube feeding regimen and free water provides 900 kcal, 69 grams of protein, and 1333 ml of H2O (meets 55% of kcal and 81% of protein needs).  - Magic Cup TID with meals, each supplement provides 290 kcal and 9 grams of protein  NUTRITION DIAGNOSIS:   Inadequate oral intake related to dysphagia, lethargy/confusion as evidenced by NPO status.  Progressing, pt now on dysphagia 1 with nectar-thick liquids  GOAL:   Patient will meet greater than or equal to 90% of their needs  Progressing  MONITOR:   PO intake, Supplement acceptance, Diet advancement, Weight trends, Labs, TF tolerance  REASON FOR ASSESSMENT:   Consult Enteral/tube feeding initiation and management  ASSESSMENT:   86 year old female with PMH of atrial fibrillation, HTN, HLD, CAD, mild cognitive impairment of vascular etiology complicated by EtOH use. Presented 09/02/19 with right-sided weakness and aphasia. 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 Near occlusive thrombus within the proximal left M1 MCA extending into M2 branch. Pt underwent complete revascularization per IR and was intubated for a short time. Follow-up head CT 09/06/19 after revascularization procedure showed petechial hemorrhage at the basal ganglia and small subarachnoid hemorrhage along the sylvian fissure. Scan completed again on 09/06/19 showing no evidence of new hemorrhage. Currently NPO with Cortrak and tube feeds. Admitted to CIR at 09/07/19.  8/23 - MBS, diet advanced to dysphagia 1 with nectar-thick liquids 8/24 - TF rate reduced to stimulate appetite  Spoke with pt's daughter at bedside. Pt asleep at time of visit. [name] reports pt does not have much of an  appetite due to tube feeds. Will transition pt to nocturnal tube feeds now that diet has been advanced. Discussed plan with RN.  [name] with questions about thickening popsicles to bring for pt. Encouraged her to reach out to SLP with questions regarding thickening.  Discussed increase in free water flushes.  Weight stable compared to admit weight.  Meal Completion: 10-30% x 4 documented meals  Medications reviewed and include: liquid MVI, protonix, vitamin B-12  Labs reviewed. CBG's: 109-148 x 24 hours  UOP: 1500 ml x 24 hours  Diet Order:   Diet Order            DIET - DYS 1 Room service appropriate? Yes; Fluid consistency: Nectar Thick  Diet effective now                 EDUCATION NEEDS:   Education needs have been addressed  Skin:  Skin Assessment: Skin Integrity Issues: Incisions: right groin  Last BM:  09/12/19  Height:   Ht Readings from Last 1 Encounters:  09/08/19 5\' 5"  (1.651 m)    Weight:   Wt Readings from Last 1 Encounters:  09/11/19 77.9 kg    Ideal Body Weight:  56.8 kg  BMI:  Body mass index is 28.58 kg/m.  Estimated Nutritional Needs:   Kcal:  1650-1850  Protein:  85-100 grams  Fluid:  1.6-1.8 L    Gaynell Face, MS, RD, LDN Inpatient Clinical Dietitian Please see AMi[facility] for contact information.

## 2019-09-12 NOTE — Progress Notes (Signed)
Physical Therapy Session Note  Patient Details  Name: [name] MRN: [id number] Date of Birth: [birth year]  Today's Date: 09/12/2019 PT Individual Time: 1005-1120 PT Individual Time Calculation (min): 75 min   Short Term Goals: Week 1:  PT Short Term Goal 1 (Week 1): Pt will maintain sitting balance EOB with mod assist up to 5 minutes PT Short Term Goal 2 (Week 1): Pt will initiate gait training PT Short Term Goal 3 (Week 1): Pt will demonstrate ability to remain aroused and engaged in therapy for >75% of session PT Short Term Goal 4 (Week 1): Pt will perform bed mobility with max assist  Skilled Therapeutic Interventions/Progress Updates: Pt presented in bed with dgt [name] present. Ok's with nsg and PTA disconnected Core Track for therapy session. Pt performed rolling dependent to L and maxA x 1 to R as PTA threaded and pulled up pants total A. PTA then donned shirt maxA x 2 as PTA sit pt upright in bed and dgt assisted with donning shirt. Once completed pt performed rolling L/R in same manner as prior to position Maxi Move slilng under pt (no +2 help available on this date.). Mechanical lift transfer to TIS. Dgt performed grooming task then pt taken to ortho gym. Pt participated in Dynavision mode A 2 x 3 min bouts. Pt was able to initiate and stay awake to reach for x 4 lights in L visual field with PTA providing minA to encourage trunk flexion. After first four lights pt requiring minA to accurately press button and verbal cues to initiate ultimately fading to mod with increased fatigue. PTA then adjusted head rest on TIS for improved head positioning.  Pt then transported to day room and participated in two sets of 5 cycles on 90cm/sec with PTA assisting RLE for sustained task and reciprocal activity. Pt was able to sustain task with verbal cues. Pt transported back to room at end of session and remained in TIS with belt alarm on, call bell within reach and needs met.      Therapy  Documentation Precautions:  Precautions Precautions: Fall Precaution Comments: corerack; R inattention Restrictions Weight Bearing Restrictions: No General:   Vital Signs: Therapy Vitals Pulse Rate: 72 Resp: 18 BP: (!) 133/56 Patient Position (if appropriate): Lying Oxygen Therapy SpO2: 100 % O2 Device: Room Air   Therapy/Group: Individual Therapy  Santrice Muzio  Aulani Shipton, PTA  09/12/2019, 4:21 PM

## 2019-09-12 NOTE — Progress Notes (Signed)
Chaplain provided follow-up care for patient, speaking mainly with pt's daughter who was present at bedside.  Continued relationship of care and concern for the pt and family.    Chaplain intends to continue offering F/U care over the next few days.  [place]

## 2019-09-12 NOTE — Progress Notes (Signed)
Chaplain dropped by to send patient greetings of love and concern from patient's church, [employer]. Daughter from [place] was bedside. She is planning on returning soon. Daughter Jodi Mourning is expected the morning.  Patient has three daughters, the local one lives in [place].  Chaplain offered presence and prayer. Rev. Tamsen Snider Pager [phone]

## 2019-09-12 NOTE — Progress Notes (Signed)
Occupational Therapy Session Note  Patient Details  Name: [name] MRN: [id number] Date of Birth: [birth year]  Today's Date: 09/12/2019 OT Individual Time: 6816-6196 OT Individual Time Calculation (min): 59 min    Short Term Goals: Week 1:  OT Short Term Goal 1 (Week 1): Pt will sit EOB statically for ~5 min with supervison in prep for ADL task OT Short Term Goal 2 (Week 1): Pt will attend to familar ADL task demonstrating sustained attention for ~3 min with min cues OT Short Term Goal 3 (Week 1): Pt will don shirt with mod A OT Short Term Goal 4 (Week 1): Pt will in bed with mod A in prep for aDL tasks  Skilled Therapeutic Interventions/Progress Updates:  Patient met seated in TIS wc with daughter present at bedside. Patient in agreement with OT treatment session with focus on RUE NMR, self-care re-education, and supporting sitting balance to decrease caregiver burden in prep for functional transfers as detailed below. Treatment session limited by lethargy with patient requiring cues to keep eyes open. Seated at sink level, patient able to follow 1-step verbal commands with >50% accuracy for completion of hair brushing task with Max A and hand over hand. E-stim with focus on anterior deltoid to maintain integrity of shoulder joint and reduce risk of subluxation. Patient seated in moderately distracting environment require maximal cueing for attention to task. Patient transferred back to supine with use of Maxi move. Rolling R<>L in supine with Max A to remove sling. Session concluded with patient lying supine in bed with LUE supported on pillow, call bell within reach, bed alarm activated, and all needs met.    Therapy Documentation Precautions:  Precautions Precautions: Fall Precaution Comments: corerack; R inattention Restrictions Weight Bearing Restrictions: No General:    Therapy/Group: Individual Therapy  [name] [name] 09/12/2019, 7:49 AM

## 2019-09-12 NOTE — Progress Notes (Signed)
Speech Language Pathology Daily Session Note  Patient Details  Name: [name] MRN: [id number] Date of Birth: 1932/12/08  Today's Date: 09/12/2019 SLP Individual Time: 0725-0810 SLP Individual Time Calculation (min): 45 min  Short Term Goals: Week 1: SLP Short Term Goal 1 (Week 1): Pt will participate in PO trials of ice, thin H2O, and puree at bedside to demonstrate readiness for MBSS prior to initiation of PO diet. SLP Short Term Goal 2 (Week 1): Pt will communicate basic wants and needs at the word level with Max A verbal and/or visual cues. SLP Short Term Goal 3 (Week 1): Pt will name common objects with 60% accuracy provided Max A multimodal cues. SLP Short Term Goal 4 (Week 1): Pt will answer basic yes/no questions to in response to wants/needs and basic biographical information with 60% accuracy with Max A mulimodal cues. SLP Short Term Goal 5 (Week 1): Pt will demonstrate awareness of functional and/or verbal errors with Max A multimodal cues. SLP Short Term Goal 6 (Week 1): Pt will sustain attention to basic functional tasks or 10 miunte intervals with Mod A multimodal cues for redirection.  Skilled Therapeutic Interventions: Pt was seen for skilled ST targeting dysphagia and communication goals, as well as education with pt's daughter at bedside. SLP provided Mod A for functional self-feeding of Dysphagia 1 (puree) breakfast tray with nectar thick liquids. Pt with no overt s/sx aspiration across textures. She exhibited weak oral/lingual manipulation of puree boluses and limited intake (which she indicated via head nod was due to lack of appetite), however full oral clearance achieved. Reduced labial seal noted with nectar, however use of straw facilitated most efficient intake. Recommend continue current diet.  Pt became increasingly lethargic after breakfast, with very limited verbal output. She required Max A question cues to communicate basic needs and preferences throughout  sessions, formulated in yes/no format, and her responses were gestural (head nod/shake) as opposed to verbal. Pt did participate in limited automatic speech tasks (counting 1-13, days of the week) with 90% accuracy and only Supervision A verbal cues to initiate sequences. Max A verbal cues also required for implementation of increased vocal intensity to aid in greater intelligibility. Education provided to pt's daughter regarding cueing techniques for most effective communication at this time and need to allow pt extra time for processing questions and commands before repeating it. Pt eventually unable to maintain arousal appropriately and therefore missed 15 minutes of skilled ST this morning. Pt left sitting upright in bed with alarm set and needs within reach, daughter and MD present. Continue per current plan of care.         Pain Pain Assessment Pain Scale: Faces Faces Pain Scale: No hurt  Therapy/Group: Individual Therapy  Arbutus Leas 09/12/2019, 8:25 AM

## 2019-09-12 NOTE — Progress Notes (Signed)
[place] PHYSICAL MEDICINE & REHABILITATION PROGRESS NOTE   Subjective/Complaints: No issues overnite, ate some breafast but was tired afterwards and missed her SLP session  ROS: limited due to language/communication    Objective:   DG Swallowing Func-Speech Pathology  Result Date: 09/11/2019 Objective Swallowing Evaluation: Type of Study: MBS-Modified Barium Swallow Study  Patient Details Name: [name] MRN: [id number] Date of Birth: Jan 28, 1932 Today's Date: 09/11/2019 Past Medical History: Past Medical History: Diagnosis Date . Arthritis  . Atrial fibrillation ([place])  . Cataract   BILATERAL . DDD (degenerative disc disease), cervical  . Depression  . Diverticulosis  . Falls  . GERD (gastroesophageal reflux disease)  . Glaucoma  . Headache  . Hyperlipidemia  . Hyperlipidemia  . Hypertension  . Osteoporosis  . Peripheral neuropathy  . Spondylosis   lumbar . Subdural hematoma [facility])  Past Surgical History: Past Surgical History: Procedure Laterality Date . APPENDECTOMY   . BACK SURGERY  2010 . BURR HOLE FOR SUBDURAL HEMATOMA    x 2 hematoma . COLONOSCOPY   . IR CT HEAD LTD  09/04/2019 . IR PERCUTANEOUS ART THROMBECTOMY/INFUSION INTRACRANIAL INC DIAG ANGIO  09/02/2019    . IR PERCUTANEOUS ART THROMBECTOMY/INFUSION INTRACRANIAL INC DIAG ANGIO  09/04/2019 . KNEE SURGERY Bilateral  . RADIOLOGY WITH ANESTHESIA N/A 09/02/2019  Procedure: IR WITH ANESTHESIA;  Surgeon: Radiologist, Medication, MD;  Location: [place];  Service: Radiology;  Laterality: N/A; . VAGINAL HYSTERECTOMY   HPI: 86 year old female with past medical history of A. fib not on anticoagulation due to frequent falling and prior subdural hematoma, hypertension, memory loss, GERD who was admitted to the hospital for altered mental status, found to have a left MCA stroke and is now status post thrombectomy. Extubated 8/16.  Assessment / Plan / Recommendation CHL IP CLINICAL IMPRESSIONS 09/11/2019 Clinical Impression Pt presents with mild-mod  oropharyngeal dysphagia s/p acute CVA and deconditioning. Pt's oral phase was most remarkable for overall weak lingual manipulation of boluses, decreased bolus cohesion, and prolonged mastication of solids. Thin barium trials were administered via cup and straw. Left and right anterior loss of thin boluses noted with consumption from cup. Pt's anterior laryngeal mobility is limited and incomplete laryngeal vestibule closure noted; pt's epiglottis does not achieve full inversion across consistencies, and suspect this is exacerbated by present of Cortrak NG tube. As a result, thin barium was both penetrated on some occasions and aspirated on others, with a delayed cough response, which was ineffective to clear aspirates from the trachea (PAS score 7). Nectar barium was deglutated with only one instance of penetration into the laryngeal vestibule, which remained above the vocal folds and appeared to be ejected with a subsequent reflexive swallow triggered by patient (PAS score 2). Pureed solids were consumed with timely and effective oral transit and clearance, and no airway intrusion noted. Pt also demonstrated ability to follow verbal commands to perform an extra dry swallow throughout intake, to assist with partial clearance of mild vallecular residue of solids and liquids. Would recommend pt initiate a puree texture solid diet, nectar thick liquids (straws okay and should be used to minimize anterior loss), medications crushed in puree, cues for following solids with liquids and intermittent extra dry swallow. Please provide full supervision during meals to ensure safe positioning and use of swallow precautions/strategies. SLP Visit Diagnosis Dysphagia, oropharyngeal phase (R13.12) Attention and concentration deficit following -- Frontal lobe and executive function deficit following -- Impact on safety and function Mild aspiration risk   CHL IP TREATMENT RECOMMENDATION  09/04/2019 Treatment Recommendations Therapy as  outlined in treatment plan below   Prognosis 09/04/2019 Prognosis for Safe Diet Advancement Good Barriers to Reach Goals Cognitive deficits;Language deficits Barriers/Prognosis Comment -- CHL IP DIET RECOMMENDATION 09/11/2019 SLP Diet Recommendations Dysphagia 1 (Puree) solids;Nectar thick liquid Liquid Administration via -- Medication Administration Crushed with puree Compensations Minimize environmental distractions;Slow rate;Small sips/bites;Monitor for anterior loss;Multiple dry swallows after each bite/sip;Follow solids with liquid Postural Changes Remain semi-upright after after feeds/meals (Comment);Seated upright at 90 degrees   CHL IP OTHER RECOMMENDATIONS 09/11/2019 Recommended Consults -- Oral Care Recommendations Oral care BID Other Recommendations Have oral suction available;Order thickener from pharmacy;Prohibited food (jello, ice cream, thin soups);Remove water pitcher   CHL IP FOLLOW UP RECOMMENDATIONS 09/07/2019 Follow up Recommendations Inpatient Rehab   CHL IP FREQUENCY AND DURATION 09/04/2019 Speech Therapy Frequency (ACUTE ONLY) min 2x/week Treatment Duration --      CHL IP ORAL PHASE 09/11/2019 Oral Phase Impaired Oral - Pudding Teaspoon -- Oral - Pudding Cup -- Oral - Honey Teaspoon -- Oral - Honey Cup -- Oral - Nectar Teaspoon -- Oral - Nectar Cup -- Oral - Nectar Straw Piecemeal swallowing;Decreased bolus cohesion;Weak lingual manipulation;Delayed oral transit Oral - Thin Teaspoon -- Oral - Thin Cup Delayed oral transit;Piecemeal swallowing;Decreased bolus cohesion;Right anterior bolus loss;Left anterior bolus loss;Weak lingual manipulation Oral - Thin Straw Piecemeal swallowing;Decreased bolus cohesion;Weak lingual manipulation;Delayed oral transit Oral - Puree Weak lingual manipulation;Piecemeal swallowing Oral - Mech Soft Delayed oral transit;Impaired mastication;Weak lingual manipulation;Reduced posterior propulsion;Decreased bolus cohesion;Lingual/palatal residue;Piecemeal swallowing Oral -  Regular -- Oral - Multi-Consistency -- Oral - Pill -- Oral Phase - Comment --  CHL IP PHARYNGEAL PHASE 09/11/2019 Pharyngeal Phase Impaired Pharyngeal- Pudding Teaspoon -- Pharyngeal -- Pharyngeal- Pudding Cup -- Pharyngeal -- Pharyngeal- Honey Teaspoon -- Pharyngeal -- Pharyngeal- Honey Cup -- Pharyngeal -- Pharyngeal- Nectar Teaspoon -- Pharyngeal -- Pharyngeal- Nectar Cup -- Pharyngeal -- Pharyngeal- Nectar Straw Reduced epiglottic inversion;Reduced anterior laryngeal mobility;Reduced airway/laryngeal closure;Penetration/Aspiration during swallow;Pharyngeal residue - valleculae;Compensatory strategies attempted (with notebox) Pharyngeal Material enters airway, remains ABOVE vocal cords then ejected out Pharyngeal- Thin Teaspoon -- Pharyngeal -- Pharyngeal- Thin Cup Reduced epiglottic inversion;Reduced anterior laryngeal mobility;Reduced airway/laryngeal closure;Pharyngeal residue - valleculae;Penetration/Aspiration during swallow;Penetration/Apiration after swallow Pharyngeal Material enters airway, CONTACTS cords and not ejected out;Material enters airway, passes BELOW cords and not ejected out despite cough attempt by patient Pharyngeal- Thin Straw Reduced epiglottic inversion;Reduced airway/laryngeal closure;Reduced anterior laryngeal mobility;Penetration/Aspiration during swallow;Penetration/Apiration after swallow;Pharyngeal residue - valleculae Pharyngeal Material enters airway, passes BELOW cords and not ejected out despite cough attempt by patient;Material enters airway, CONTACTS cords and not ejected out Pharyngeal- Puree Reduced epiglottic inversion;Reduced airway/laryngeal closure;Pharyngeal residue - valleculae;Reduced anterior laryngeal mobility Pharyngeal -- Pharyngeal- Mechanical Soft Reduced anterior laryngeal mobility;Reduced epiglottic inversion;Pharyngeal residue - valleculae;Reduced airway/laryngeal closure Pharyngeal -- Pharyngeal- Regular -- Pharyngeal -- Pharyngeal- Multi-consistency --  Pharyngeal -- Pharyngeal- Pill -- Pharyngeal -- Pharyngeal Comment --  CHL IP CERVICAL ESOPHAGEAL PHASE 09/11/2019 Cervical Esophageal Phase WFL Pudding Teaspoon -- Pudding Cup -- Honey Teaspoon -- Honey Cup -- Nectar Teaspoon -- Nectar Cup -- Nectar Straw -- Thin Teaspoon -- Thin Cup -- Thin Straw -- Puree -- Mechanical Soft -- Regular -- Multi-consistency -- Pill -- Cervical Esophageal Comment -- Arbutus Leas 09/11/2019, 9:53 AM              No results for input(s): WBC, HGB, HCT, PLT in the last 72 hours. No results for input(s): NA, K, CL, CO2, GLUCOSE, BUN, CREATININE, CALCIUM in the last 72 hours.  Intake/Output Summary (Last 24 hours) at 09/12/2019 0803 Last data filed at 09/12/2019 0045 Gross per 24 hour  Intake 310 ml  Output 1500 ml  Net -1190 ml     Physical Exam: Vital Signs Blood pressure (!) 143/71, pulse 63, temperature 97.6 F (36.4 C), temperature source Oral, resp. rate 18, height 5\' 5"  (1.651 m), weight 77.9 kg, SpO2 98 %.  General: No acute distress Mood and affect are appropriate Heart: Regular rate and rhythm no rubs murmurs or extra sounds Lungs: Clear to auscultation, breathing unlabored, no rales or wheezes Abdomen: Positive bowel sounds, soft nontender to palpation, nondistended Extremities: No clubbing, cyanosis, or edema Skin: No evidence of breakdown, no evidence of rash  Neurologic:  ongoing expressive>receptive aphasia,min verbal attempts, raises R Left arm to command but would not protrude tongue , started to move finger towards nose but did not complete the task  weak cough/voice, motor strength is4/5 in left 0/5 right deltoid, bicep, tricep, grip, hip flexor, knee extensors, ankle dorsiflexor and plantar flexor Sensory exam difficult to assess, decreased but present pain sense RUE Musculoskeletal:No joint swelling   Assessment/Plan: 1. Functional deficits secondary to Left BG infarct  which require 3+ hours per day of interdisciplinary therapy in a  comprehensive inpatient rehab setting.  Physiatrist is providing close team supervision and 24 hour management of active medical problems listed below.  Physiatrist and rehab team continue to assess barriers to discharge/monitor patient progress toward functional and medical goals  Care Tool:  Bathing        Body parts bathed by helper: Right arm, Right lower leg, Left arm, Chest, Left lower leg, Face, Abdomen, Front perineal area, Buttocks, Right upper leg, Left upper leg     Bathing assist Assist Level: 2 Helpers     Upper Body Dressing/Undressing Upper body dressing   What is the patient wearing?: Pull over shirt    Upper body assist Assist Level: Total Assistance - Patient < 25%    Lower Body Dressing/Undressing Lower body dressing      What is the patient wearing?: Pants     Lower body assist Assist for lower body dressing: 2 Helpers     Toileting Toileting    Toileting assist Assist for toileting: Dependent - Patient 0% (cath )     Transfers Chair/bed transfer  Transfers assist  Chair/bed transfer activity did not occur: Safety/medical concerns (would recommend lift for patient- needed skilled intervention to transfer)  Chair/bed transfer assist level: 2 Helpers     Locomotion Ambulation   Ambulation assist   Ambulation activity did not occur: Safety/medical concerns          Walk 10 feet activity   Assist  Walk 10 feet activity did not occur: Safety/medical concerns        Walk 50 feet activity   Assist Walk 50 feet with 2 turns activity did not occur: Safety/medical concerns         Walk 150 feet activity   Assist Walk 150 feet activity did not occur: Safety/medical concerns         Walk 10 feet on uneven surface  activity   Assist Walk 10 feet on uneven surfaces activity did not occur: Safety/medical concerns         Wheelchair     Assist Will patient use wheelchair at discharge?: Yes      Wheelchair  assist level: Dependent - Patient 0% Max wheelchair distance: 150    Wheelchair 50 feet with 2 turns  activity    Assist        Assist Level: Dependent - Patient 0%   Wheelchair 150 feet activity     Assist      Assist Level: Dependent - Patient 0%   Blood pressure (!) 143/71, pulse 63, temperature 97.6 F (36.4 C), temperature source Oral, resp. rate 18, height 5\' 5"  (1.651 m), weight 77.9 kg, SpO2 98 %.  Medical Problem List and Plan: 1.Right side weakness and aphasiasecondary to left MCA territory infarction status post IR revascularization with SAH -patient may Shower if has good enough sitting balance -ELOS/Goals: 3-4 weeks- goals min A hopefully  -spent time reviewing meds, course with daughter at bedside 2. Antithrombotics: -DVT/anticoagulation:SCDs- had petechial hemrrhages -antiplatelet therapy: Aspirin 81 mg daily 3. Pain Management:will schedule  Tylenol q6 hours through NGT 4. Mood:Amantadine 100 mg daily, Zoloft 50 mg daily, Aricept 10 mg nightly-  -added 1200 dose of amantadine-->BID on 8/21  -consider ritalin trial if no sig increase in LOA with amantadine, f/u in conf tomorrow -antipsychotic agents: N/A 5. Neuropsych: This patientisNOTcapable of making decisions on herown behalf. 6. Skin/Wound Care:Routine skin checks 7. Fluids/Electrolytes/Nutrition:Routine in and outs with follow-up chemistries, reduce TF to promote appetite 8. Dysphagia. Tube feeds as directed. Dietary follow-up. Speech therapy follow-up- has Cortrak- assess if things improves, or if needs PEG long term  -monitor intake as well as S/Sx of asp on po   -have asked RT for vest chest PT to help her mobilize secretions in chest in the short term.  9. Hypertension. Lopressor 50 mg twice daily, Cozaar 25 mg daily, Norvasc 10 mg daily.  Vitals:   09/11/19 2131 09/12/19 0419  BP: (!) 153/79 (!) 143/71  Pulse: 66 63  Resp:   18  Temp:  97.6 F (36.4 C)  SpO2:  98%   8/24 fair control  10. Hyperlipidemia. Lipitor 11. History of alcohol use. Provide counseling with patient and family 72. Atrial fibrillation. No anticoagulation at this time.   Continue beta-blocker  -HR controlled 8/21 13. Urinary retention/proteus UTI- discussed with daughter this may cause a temporary setback in recovery   -keflex 250mg  TID started 8/21  -cx demonstrates >100K proteus S to keflex   -req I/O caths, volumes 350-450 cc overnight into this morning  -will try low dose urecholine to help with emptying  -purewick if needed to keep skin dry, but mostly retaining right now 14. Sleep-/insomnia- pt took 30, not 3 mg of Melatonin nightly at home AND 3+ pills of ZZZquil at home to sleep nightly per daughter- FYI    LOS: 5 days A FACE TO Cook E Allisen Pidgeon 09/12/2019, 8:03 AM

## 2019-09-13 ENCOUNTER — Inpatient Hospital Stay (HOSPITAL_COMMUNITY): Payer: Medicare Other | Admitting: Occupational Therapy

## 2019-09-13 ENCOUNTER — Inpatient Hospital Stay (HOSPITAL_COMMUNITY): Payer: Medicare Other | Admitting: Speech Pathology

## 2019-09-13 ENCOUNTER — Inpatient Hospital Stay (HOSPITAL_COMMUNITY): Payer: Medicare Other

## 2019-09-13 ENCOUNTER — Inpatient Hospital Stay (HOSPITAL_COMMUNITY): Payer: Medicare Other | Admitting: Physical Therapy

## 2019-09-13 LAB — GLUCOSE, CAPILLARY
Glucose-Capillary: 117 mg/dL — ABNORMAL HIGH (ref 70–99)
Glucose-Capillary: 130 mg/dL — ABNORMAL HIGH (ref 70–99)
Glucose-Capillary: 172 mg/dL — ABNORMAL HIGH (ref 70–99)

## 2019-09-13 LAB — BASIC METABOLIC PANEL
Anion gap: 9 (ref 5–15)
BUN: 26 mg/dL — ABNORMAL HIGH (ref 8–23)
CO2: 27 mmol/L (ref 22–32)
Calcium: 9.3 mg/dL (ref 8.9–10.3)
Chloride: 95 mmol/L — ABNORMAL LOW (ref 98–111)
Creatinine, Ser: 0.77 mg/dL (ref 0.44–1.00)
GFR calc Af Amer: >60 mL/min (ref >60)
GFR calc non Af Amer: >60 mL/min (ref >60)
Glucose, Bld: 113 mg/dL — ABNORMAL HIGH (ref 70–99)
Potassium: 4.9 mmol/L (ref 3.5–5.1)
Sodium: 131 mmol/L — ABNORMAL LOW (ref 135–145)

## 2019-09-13 MED ORDER — RESOURCE THICKENUP CLEAR PO POWD
ORAL | Status: DC | PRN
  Filled 2019-09-13: qty 125

## 2019-09-13 MED ORDER — METHYLPHENIDATE HCL 5 MG PO TABS
5.0000 mg | ORAL_TABLET | Freq: Two times a day (BID) | ORAL | Status: DC
  Administered 2019-09-13 – 2019-09-18 (×9): 5 mg via ORAL
  Filled 2019-09-13 (×9): qty 1

## 2019-09-13 NOTE — Progress Notes (Addendum)
Occupational Therapy Session Note  Patient Details  Name: [name] MRN: [id number] Date of Birth: 05/28/1932  Today's Date: 09/13/2019 OT Individual Time: 7614-7092 OT Individual Time Calculation (min): 41 min    Short Term Goals: Week 1:  OT Short Term Goal 1 (Week 1): Pt will sit EOB statically for ~5 min with supervison in prep for ADL task OT Short Term Goal 2 (Week 1): Pt will attend to familar ADL task demonstrating sustained attention for ~3 min with min cues OT Short Term Goal 3 (Week 1): Pt will don shirt with mod A OT Short Term Goal 4 (Week 1): Pt will in bed with mod A in prep for aDL tasks  Skilled Therapeutic Interventions/Progress Updates:  Patient met seated in TIS wc with daughter present at bedside. Per RN request, patient returned to supine for in&out cath. Total A +2 for anterior scoot in TIS wc, squat-pivot transfer with bilateral knee block, and return to supine at BLE and trunk. Rolling R<>L to doff LB clothing with Max A. Session limited by lethargy with inability to keep eyes open for longer than 5-10 seconds at a time. E-stim with Rowe Robert initiated this date at R anterior deltoid in combination with PROM <90 degrees to maintain joint integrity with parameters below. Session concluded with patient lying supine in bed with pillow supporting RUE, Saebo e-stim in place, daughter present at bedside with call bell within reach and bed alarm activated.    Therapy Documentation Precautions:  Precautions Precautions: Fall Precaution Comments: corerack; R inattention Restrictions Weight Bearing Restrictions: No General:    Therapy/Group: Individual Therapy  Rosalind Guido [name] 09/13/2019, 7:21 AM

## 2019-09-13 NOTE — NC FL2 (Signed)
[place] LEVEL OF CARE SCREENING TOOL     IDENTIFICATION  Patient Name: [name] Birthdate: January 28, 1932 Sex: female Admission Date (Current Location): 09/07/2019  [facility] and [place] Number:  [employer] and Address:  [facility], [place] [address], [place], [place] [address]      Provider Number: [id number]  Attending Physician Name and Address:  Charlett Blake, MD  Relative Name and Phone Number:  Jeanie Sewer [phone]    Current Level of Care: Hospital Recommended Level of Care: [place] Prior Approval Number:    Date Approved/Denied:   PASRR Number: [id number]  Discharge Plan: SNF    Current Diagnoses: Patient Active Problem List   Diagnosis Date Noted  . Obesity 09/07/2019  . Urinary retention 09/07/2019  . 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 Dyslipidemia, goal LDL below 70   . Lethargic   . Alcohol abuse   . Hyponatremia   . Acute ischemic right MCA stroke ([place]) s/p mechanical thrombectomy d/t AF not on [facility] 09/02/2019  . Acute respiratory failure ([place])   . HTN (hypertension) 06/21/2019  . Mild cognitive impairment 04/02/2016  . Falls 05/18/2015  . Atrial fibrillation ([place]) 10/30/2014  . Whiplash 10/16/2014  . Memory changes 10/16/2014  . Hemorrhage of rectum and anus 10/13/2013    Orientation RESPIRATION BLADDER Height & Weight     Place, Situation  Normal Incontinent Weight: 171 lb 4.8 oz (77.7 kg) Height:  5\' 5"  (165.1 cm)  BEHAVIORAL SYMPTOMS/MOOD NEUROLOGICAL BOWEL NUTRITION STATUS      Incontinent Feeding tube, Diet  AMBULATORY STATUS COMMUNICATION OF NEEDS Skin   Extensive Assist Verbally Normal                       Personal Care Assistance Level of Assistance  Total care       Total Care Assistance: Maximum assistance   Functional Limitations Info             SPECIAL CARE FACTORS FREQUENCY  PT (By licensed PT), OT (By licensed  OT), Speech therapy     PT Frequency: 5x a week OT Frequency: 5x a week     Speech Therapy Frequency: 5x a week      Contractures      Additional Factors Info  Code Status Code Status Info: DNR             Current Medications (09/13/2019):  This is the current hospital active medication list Current Facility-Administered Medications  Medication Dose Route Frequency Provider Last Rate Last Admin  . acetaminophen (TYLENOL) 160 MG/5ML solution 650 mg  650 mg Per Tube Q6H Meredith Staggers, MD   650 mg at 09/13/19 1132  . amantadine (SYMMETREL) 50 MG/5ML solution 100 mg  100 mg Per Tube BID Meredith Staggers, MD   100 mg at 09/12/19 0827  . amLODipine (NORVASC) tablet 10 mg  10 mg Per Tube Daily Cathlyn Parsons, PA-C   10 mg at 09/13/19 6948  . aspirin suppository 300 mg  300 mg Rectal Daily Angiulli, Lavon Paganini, PA-C       Or  . aspirin chewable tablet 81 mg  81 mg Per Tube Daily Cathlyn Parsons, PA-C   81 mg at 09/13/19 5462  . atorvastatin (LIPITOR) tablet 40 mg  40 mg Per Tube Daily Cathlyn Parsons, PA-C   40 mg at 09/13/19 7035  . bethanechol (URECHOLINE) tablet 25  mg  25 mg Oral TID Charlett Blake, MD   25 mg at 09/13/19 7026  . cephALEXin (KEFLEX) capsule 250 mg  250 mg Oral Q8H Meredith Staggers, MD   250 mg at 09/13/19 [phone]  . chlorhexidine (PERIDEX) 0.12 % solution 15 mL  15 mL Mouth Rinse BID Charlett Blake, MD   15 mL at 09/13/19 8850  . donepezil (ARICEPT) tablet 10 mg  10 mg Per Tube QHS AngiulliLavon Paganini, PA-C   10 mg at 09/12/19 2155  . feeding supplement (OSMOLITE 1.2 CAL) liquid 650 mL  650 mL Per Tube Q24H Charlett Blake, MD 65 mL/hr at 09/12/19 2200 650 mL at 09/12/19 2200  . feeding supplement (PROSource TF) liquid 45 mL  45 mL Per Tube TID Charlett Blake, MD   45 mL at 09/13/19 [phone]  . free water 200 mL  200 mL Per Tube Q6H Kirsteins, Luanna Salk, MD   200 mL at 09/13/19 1133  . latanoprost (XALATAN) 0.005 % ophthalmic solution 1 drop  1  drop Both Eyes QHS Cathlyn Parsons, PA-C   1 drop at 09/12/19 2158  . losartan (COZAAR) tablet 25 mg  25 mg Per Tube Daily Cathlyn Parsons, PA-C   25 mg at 09/13/19 1287  . MEDLINE mouth rinse  15 mL Mouth Rinse q12n4p Kirsteins, Luanna Salk, MD   15 mL at 09/13/19 1133  . methylphenidate (RITALIN) tablet 5 mg  5 mg Oral BID WC Kirsteins, Luanna Salk, MD   5 mg at 09/13/19 1133  . metoprolol tartrate (LOPRESSOR) 25 mg/10 mL oral suspension 50 mg  50 mg Per Tube BID Cathlyn Parsons, PA-C   50 mg at 09/13/19 8676  . multivitamin liquid 15 mL  15 mL Per Tube Daily Cathlyn Parsons, PA-C   15 mL at 09/13/19 0831  . pantoprazole sodium (PROTONIX) 40 mg/20 mL oral suspension 40 mg  40 mg Per Tube QHS AngiulliLavon Paganini, PA-C   40 mg at 09/12/19 2154  . Resource ThickenUp Clear   Oral PRN Kirsteins, Luanna Salk, MD      . senna-docusate (Senokot-S) tablet 1 tablet  1 tablet Oral QHS PRN Angiulli, Lavon Paganini, PA-C      . sertraline (ZOLOFT) tablet 50 mg  50 mg Per Tube Daily Cathlyn Parsons, PA-C   50 mg at 09/13/19 7209  . timolol (TIMOPTIC) 0.5 % ophthalmic solution 1 drop  1 drop Both Eyes Daily Cathlyn Parsons, PA-C   1 drop at 09/13/19 4709  . vitamin B-12 (CYANOCOBALAMIN) tablet 3,000 mcg  3,000 mcg Per Tube Daily Cathlyn Parsons, PA-C   3,000 mcg at 09/13/19 6283     Discharge Medications: Please see discharge summary for a list of discharge medications.  Relevant Imaging Results:  Relevant Lab Results:   Additional Information    [name]

## 2019-09-13 NOTE — Progress Notes (Signed)
Chaplain continued continuance of care to patient and family.  Patient was sitting up and acknowledged with a smile Chaplain is from her home church, [employer].  Chaplain brought greetings and well wishes from patient's friends.  Chaplain prayed prayer of thanksgiving and praise for patient's progress.  Chaplain will continue to follow up with visits and prayer.  [place]

## 2019-09-13 NOTE — Progress Notes (Signed)
Patient resting at intervals throughout shift, arousal but respond slowly initial during the shift, more noted improved as shift continue with her responses. Osmolite feeding continue and tolerate via Cortrak tube, HOB elevated at 30 degree,breathe sound diminished  made as comfortable as possible, Oral care provided and tolerated, Incontinent care of bowel, I& O cath Q 8 hrs, and tolerated well. Continue regime

## 2019-09-13 NOTE — Patient Care Conference (Signed)
Inpatient RehabilitationTeam Conference and Plan of Care Update Date: 09/13/2019   Time: 10:02 AM    Patient Name: [name] Record Number: [id number]  Date of Birth: [birth year] Sex: Female         Room/Bed: 4W22C/4W22C-01 Payor Info: Payor: MEDICARE / Plan: MEDICARE PART A AND B / Product Type: *No Product type* /    Admit Date/Time:  09/07/2019  5:12 PM  Primary Diagnosis:  Left middle cerebral artery stroke [facility])  Hospital Problems: Principal Problem:   Left middle cerebral artery stroke [facility]) Active Problems:   Atrial fibrillation ([place])   Mild cognitive impairment   HTN (hypertension)   Urinary retention    Expected Discharge Date: Expected Discharge Date: 09/29/19 (SNF)  Team Members Present: Care Coodinator Present: Dorien Chihuahua, RN, BSN, CRRN;Christina [place], [place] Nurse Present: Rayne Du, LPN PT Present: Barrie Folk, PT OT Present: Turner Daniels, OT SLP Present: Jettie Booze, CF-SLP PPS Coordinator present : Gunnar Fusi, SLP     Current Status/Progress Goal Weekly Team Focus  Bowel/Bladder   I/O cath Q 8hrs for NO VOIDS,Incontinent of BowelLBM 09/13/19  Continent of bladder/bowel  Assess toileting needs Q2 hrs and prn,   Swallow/Nutrition/ Hydration             ADL's   +2 assist for self care tasks bedlevel and for OOB transfers, no toilet transfer attempted due to safety concerns  Min A overall  Functional transfers, alertness, NMR, praxis, balance, family education   Mobility   max-total A bed moblity, maxA x 2 squat pivot/stand pivot transfers, maxA STS, limited by lethargy  modA transfers, minA bed mobility, minA w/c mobiltiy  bed mobility, sitting balance, transfers, d/c planning   Communication   Max expressive, Mod receptive  Min  yes/no, establishing multimodal or verbal means of communicating wants/needs, increasing verbal output   Safety/Cognition/ Behavioral Observations  Mod-Max     sustained attention  functional tasks, emergent awareness functional   Pain   Denies pain  < 3  Assess Q shift and prn main, offer meds for pain discomfort, and reassess   Skin   Skin intact  Mainain skin integrity, remain free of skin breakdown,  Assess QS/PRN,, reposition and turn O2 hrs with oral/skin care provided, elevate heels     Discharge Planning:  Goal to discharge back to [place] with asisstance pending process. If unable to return daughter agrees to SNF placement   Team Discussion: Poor po intake, continue TF, UTI (treated with Keflex) may have had some effect on lethargy. Improved verbal response noted and increased participation with ADLs. Pushing with activities. Requires I+O catheterizations q 8 hours and is incontinent of bowel .   Patient on target to meet rehab goals: yes,  requires max assist for transfers with min assist goals  *See Care Plan and progress notes for long and short-term goals.   Revisions to Treatment Plan:  Recommending SNF vs home with hired help due to limited caregivers and do not anticipate ability to return to premorbid level due to continued receptive and expressive speech/cognitive deficits  Teaching Needs: Transfers, toileting, intermittent catheterizations vs foley management, medications, etc.  Current Barriers to Discharge: Decreased caregiver support, Incontinence and Nutritional means  Possible Resolutions to Barriers: Treatment for UTI completed Family education with daughter     Medical Summary Current Status: starting po poor intake, level of alertness decreased but appears better today, UTI on Keflex  Barriers to Discharge: Medical stability;Nutrition means  Possible Resolutions to [employer] Focus: assessintake need for stimulant assess effect of decreased TF   Continued Need for Acute Rehabilitation Level of Care: The patient requires daily medical management by a physician with specialized training in physical medicine and  rehabilitation for the following reasons: Direction of a multidisciplinary physical rehabilitation program to maximize functional independence : Yes Medical management of patient stability for increased activity during participation in an intensive rehabilitation regime.: Yes Analysis of laboratory values and/or radiology reports with any subsequent need for medication adjustment and/or medical intervention. : Yes   I attest that I was present, lead the team conference, and concur with the assessment and plan of the team.   Dorien Chihuahua B 09/13/2019, 2:05 PM

## 2019-09-13 NOTE — Progress Notes (Signed)
Speech Language Pathology Daily Session Note  Patient Details  Name: [name] MRN: [id number] Date of Birth: 11-Feb-1932  Today's Date: 09/13/2019 SLP Individual Time: 0730-0830 SLP Individual Time Calculation (min): 60 min  Short Term Goals: Week 1: SLP Short Term Goal 1 (Week 1): Pt will participate in PO trials of ice, thin H2O, and puree at bedside to demonstrate readiness for MBSS prior to initiation of PO diet. SLP Short Term Goal 2 (Week 1): Pt will communicate basic wants and needs at the word level with Max A verbal and/or visual cues. SLP Short Term Goal 3 (Week 1): Pt will name common objects with 60% accuracy provided Max A multimodal cues. SLP Short Term Goal 4 (Week 1): Pt will answer basic yes/no questions to in response to wants/needs and basic biographical information with 60% accuracy with Max A mulimodal cues. SLP Short Term Goal 5 (Week 1): Pt will demonstrate awareness of functional and/or verbal errors with Max A multimodal cues. SLP Short Term Goal 6 (Week 1): Pt will sustain attention to basic functional tasks or 10 miunte intervals with Mod A multimodal cues for redirection.  Skilled Therapeutic Interventions: Pt was seen for skilled ST targeting dysphagia and communication goals. SLP provided Mod A for self feeding of current diet Dys 1 (puree) breakfast tray with nectar thick liquids. Pt exhibited 1 episode of delayed coughing during intake. She followed commands for volitional coughing and extra dry swallows with Min A verbal cues and demonstration. Recommend continue current diet. [name], Casimer Bilis, also present and educated regarding swallow strategies and safe positioning for intake. She was signed off to provide full supervision during meals. Pt with noteworthy increase in overall verbalizations at the word and short phrase level today, able to provide social greetings and automatic phrases. She also remained appropriately alert throughout session today  (much improved since last several encounters). She demonstrated ability to express functional phrases to express needs and preferences with Mod A question cues. She required Max A semantic sentence completion and phonemic cues to name common objects with 80% accuracy. Pt with 1/5 accuracy in matching written word to common objects from a field of 2. Pt read at the word level without need for cueing. Mild perseveration on words noted during session, although easily redirected with phonemic cues. Pt left laying in bed with alarm set and needs within reach. Continue per current plan of care.          Pain Pain Assessment Pain Scale: Faces Faces Pain Scale: No hurt  Therapy/Group: Individual Therapy  [name] 09/13/2019, 9:07 AM

## 2019-09-13 NOTE — Progress Notes (Signed)
[place] [facility] PHYSICAL MEDICINE & REHABILITATION PROGRESS NOTE   Subjective/Complaints:  No issues overnite per daughter, RN in room as well.  RT setting up vibration vest  Pt alert this am says good morning, smiling   ROS: limited due to language/communication    Objective:   DG Swallowing Func-Speech Pathology  Result Date: 09/11/2019 Objective Swallowing Evaluation: Type of Study: MBS-Modified Barium Swallow Study  Patient Details Name: Anaija KEIARAH ORLOWSKI MRN: [id number] Date of Birth: Jun 10, 1932 Today's Date: 09/11/2019 Past Medical History: Past Medical History: Diagnosis Date . Arthritis  . Atrial fibrillation ([place])  . Cataract   BILATERAL . DDD (degenerative disc disease), cervical  . Depression  . Diverticulosis  . Falls  . GERD (gastroesophageal reflux disease)  . Glaucoma  . Headache  . Hyperlipidemia  . Hyperlipidemia  . Hypertension  . Osteoporosis  . Peripheral neuropathy  . Spondylosis   lumbar . Subdural hematoma [facility])  Past Surgical History: Past Surgical History: Procedure Laterality Date . APPENDECTOMY   . BACK SURGERY  2010 . BURR HOLE FOR SUBDURAL HEMATOMA    x 2 hematoma . COLONOSCOPY   . IR CT HEAD LTD  09/04/2019 . IR PERCUTANEOUS ART THROMBECTOMY/INFUSION INTRACRANIAL INC DIAG ANGIO  09/02/2019    . IR PERCUTANEOUS ART THROMBECTOMY/INFUSION INTRACRANIAL INC DIAG ANGIO  09/04/2019 . KNEE SURGERY Bilateral  . RADIOLOGY WITH ANESTHESIA N/A 09/02/2019  Procedure: IR WITH ANESTHESIA;  Surgeon: Radiologist, Medication, MD;  Location: [facility];  Service: Radiology;  Laterality: N/A; . VAGINAL HYSTERECTOMY   HPI: 86 year old female with past medical history of A. fib not on anticoagulation due to frequent falling and prior subdural hematoma, hypertension, memory loss, GERD who was admitted to the hospital for altered mental status, found to have a left MCA stroke and is now status post thrombectomy. Extubated 8/16.  Assessment / Plan / Recommendation CHL IP CLINICAL IMPRESSIONS 09/11/2019 Clinical  Impression Pt presents with mild-mod oropharyngeal dysphagia s/p acute CVA and deconditioning. Pt's oral phase was most remarkable for overall weak lingual manipulation of boluses, decreased bolus cohesion, and prolonged mastication of solids. Thin barium trials were administered via cup and straw. Left and right anterior loss of thin boluses noted with consumption from cup. Pt's anterior laryngeal mobility is limited and incomplete laryngeal vestibule closure noted; pt's epiglottis does not achieve full inversion across consistencies, and suspect this is exacerbated by present of Cortrak NG tube. As a result, thin barium was both penetrated on some occasions and aspirated on others, with a delayed cough response, which was ineffective to clear aspirates from the trachea (PAS score 7). Nectar barium was deglutated with only one instance of penetration into the laryngeal vestibule, which remained above the vocal folds and appeared to be ejected with a subsequent reflexive swallow triggered by patient (PAS score 2). Pureed solids were consumed with timely and effective oral transit and clearance, and no airway intrusion noted. Pt also demonstrated ability to follow verbal commands to perform an extra dry swallow throughout intake, to assist with partial clearance of mild vallecular residue of solids and liquids. Would recommend pt initiate a puree texture solid diet, nectar thick liquids (straws okay and should be used to minimize anterior loss), medications crushed in puree, cues for following solids with liquids and intermittent extra dry swallow. Please provide full supervision during meals to ensure safe positioning and use of swallow precautions/strategies. SLP Visit Diagnosis Dysphagia, oropharyngeal phase (R13.12) Attention and concentration deficit following -- Frontal lobe and executive function deficit following -- Impact on  safety and function Mild aspiration risk   CHL IP TREATMENT RECOMMENDATION 09/04/2019  Treatment Recommendations Therapy as outlined in treatment plan below   Prognosis 09/04/2019 Prognosis for Safe Diet Advancement Good Barriers to Reach Goals Cognitive deficits;Language deficits Barriers/Prognosis Comment -- CHL IP DIET RECOMMENDATION 09/11/2019 SLP Diet Recommendations Dysphagia 1 (Puree) solids;Nectar thick liquid Liquid Administration via -- Medication Administration Crushed with puree Compensations Minimize environmental distractions;Slow rate;Small sips/bites;Monitor for anterior loss;Multiple dry swallows after each bite/sip;Follow solids with liquid Postural Changes Remain semi-upright after after feeds/meals (Comment);Seated upright at 90 degrees   CHL IP OTHER RECOMMENDATIONS 09/11/2019 Recommended Consults -- Oral Care Recommendations Oral care BID Other Recommendations Have oral suction available;Order thickener from pharmacy;Prohibited food (jello, ice cream, thin soups);Remove water pitcher   CHL IP FOLLOW UP RECOMMENDATIONS 09/07/2019 Follow up Recommendations Inpatient Rehab   CHL IP FREQUENCY AND DURATION 09/04/2019 Speech Therapy Frequency (ACUTE ONLY) min 2x/week Treatment Duration --      CHL IP ORAL PHASE 09/11/2019 Oral Phase Impaired Oral - Pudding Teaspoon -- Oral - Pudding Cup -- Oral - Honey Teaspoon -- Oral - Honey Cup -- Oral - Nectar Teaspoon -- Oral - Nectar Cup -- Oral - Nectar Straw Piecemeal swallowing;Decreased bolus cohesion;Weak lingual manipulation;Delayed oral transit Oral - Thin Teaspoon -- Oral - Thin Cup Delayed oral transit;Piecemeal swallowing;Decreased bolus cohesion;Right anterior bolus loss;Left anterior bolus loss;Weak lingual manipulation Oral - Thin Straw Piecemeal swallowing;Decreased bolus cohesion;Weak lingual manipulation;Delayed oral transit Oral - Puree Weak lingual manipulation;Piecemeal swallowing Oral - Mech Soft Delayed oral transit;Impaired mastication;Weak lingual manipulation;Reduced posterior propulsion;Decreased bolus cohesion;Lingual/palatal  residue;Piecemeal swallowing Oral - Regular -- Oral - Multi-Consistency -- Oral - Pill -- Oral Phase - Comment --  CHL IP PHARYNGEAL PHASE 09/11/2019 Pharyngeal Phase Impaired Pharyngeal- Pudding Teaspoon -- Pharyngeal -- Pharyngeal- Pudding Cup -- Pharyngeal -- Pharyngeal- Honey Teaspoon -- Pharyngeal -- Pharyngeal- Honey Cup -- Pharyngeal -- Pharyngeal- Nectar Teaspoon -- Pharyngeal -- Pharyngeal- Nectar Cup -- Pharyngeal -- Pharyngeal- Nectar Straw Reduced epiglottic inversion;Reduced anterior laryngeal mobility;Reduced airway/laryngeal closure;Penetration/Aspiration during swallow;Pharyngeal residue - valleculae;Compensatory strategies attempted (with notebox) Pharyngeal Material enters airway, remains ABOVE vocal cords then ejected out Pharyngeal- Thin Teaspoon -- Pharyngeal -- Pharyngeal- Thin Cup Reduced epiglottic inversion;Reduced anterior laryngeal mobility;Reduced airway/laryngeal closure;Pharyngeal residue - valleculae;Penetration/Aspiration during swallow;Penetration/Apiration after swallow Pharyngeal Material enters airway, CONTACTS cords and not ejected out;Material enters airway, passes BELOW cords and not ejected out despite cough attempt by patient Pharyngeal- Thin Straw Reduced epiglottic inversion;Reduced airway/laryngeal closure;Reduced anterior laryngeal mobility;Penetration/Aspiration during swallow;Penetration/Apiration after swallow;Pharyngeal residue - valleculae Pharyngeal Material enters airway, passes BELOW cords and not ejected out despite cough attempt by patient;Material enters airway, CONTACTS cords and not ejected out Pharyngeal- Puree Reduced epiglottic inversion;Reduced airway/laryngeal closure;Pharyngeal residue - valleculae;Reduced anterior laryngeal mobility Pharyngeal -- Pharyngeal- Mechanical Soft Reduced anterior laryngeal mobility;Reduced epiglottic inversion;Pharyngeal residue - valleculae;Reduced airway/laryngeal closure Pharyngeal -- Pharyngeal- Regular -- Pharyngeal --  Pharyngeal- Multi-consistency -- Pharyngeal -- Pharyngeal- Pill -- Pharyngeal -- Pharyngeal Comment --  CHL IP CERVICAL ESOPHAGEAL PHASE 09/11/2019 Cervical Esophageal Phase WFL Pudding Teaspoon -- Pudding Cup -- Honey Teaspoon -- Honey Cup -- Nectar Teaspoon -- Nectar Cup -- Nectar Straw -- Thin Teaspoon -- Thin Cup -- Thin Straw -- Puree -- Mechanical Soft -- Regular -- Multi-consistency -- Pill -- Cervical Esophageal Comment -- Arbutus Leas 09/11/2019, 9:53 AM              No results for input(s): WBC, HGB, HCT, PLT in the last 72 hours. Recent Labs    09/13/19  0617  NA 131*  K 4.9  CL 95*  CO2 27  GLUCOSE 113*  BUN 26*  CREATININE 0.77  CALCIUM 9.3    Intake/Output Summary (Last 24 hours) at 09/13/2019 0851 Last data filed at 09/13/2019 0230 Gross per 24 hour  Intake 120 ml  Output 2140 ml  Net -2020 ml     Physical Exam: Vital Signs Blood pressure 130/75, pulse 81, temperature 97.8 F (36.6 C), resp. rate 17, height $RemoveBe'5\' 5"'QAttTZrKc$  (1.651 m), weight 77.7 kg, SpO2 99 %. HEENT- cortrak in nares  General: No acute distress Mood and affect are appropriate Heart: Regular rate and rhythm no rubs murmurs or extra sounds Lungs: Clear to auscultation, breathing unlabored, no rales or wheezes Abdomen: Positive bowel sounds, soft nontender to palpation, nondistended Extremities: No clubbing, cyanosis, or edema Skin: No evidence of breakdown, no evidence of rash Neurologic: Cranial nerves II through XII intact, motor strength is 5/5 in bilateral deltoid, bicep, tricep, grip, hip flexor, knee extensors, ankle dorsiflexor and plantar flexor Sensory exam normal sensation to light touch and proprioception in bilateral upper and lower extremities Cerebellar exam normal finger to nose to finger as well as heel to shin in bilateral upper and lower extremities Musculoskeletal: Full range of motion in all 4 extremities. No joint swelling  Neurologic:  ongoing expressive>receptive aphasia,min verbal  attempts, raises  Left arm to command , started to move finger towards nose but did not complete the task  weak voice, motor strength is4/5 in left 0/5 right deltoid, bicep, tricep, grip, hip flexor, knee extensors, ankle dorsiflexor and plantar flexor  Musculoskeletal:No joint swelling   Assessment/Plan: 1. Functional deficits secondary to Left BG infarct  which require 3+ hours per day of interdisciplinary therapy in a comprehensive inpatient rehab setting.  Physiatrist is providing close team supervision and 24 hour management of active medical problems listed below.  Physiatrist and rehab team continue to assess barriers to discharge/monitor patient progress toward functional and medical goals  Care Tool:  Bathing        Body parts bathed by helper: Right arm, Right lower leg, Left arm, Chest, Left lower leg, Face, Abdomen, Front perineal area, Buttocks, Right upper leg, Left upper leg     Bathing assist Assist Level: 2 Helpers     Upper Body Dressing/Undressing Upper body dressing   What is the patient wearing?: Pull over shirt    Upper body assist Assist Level: Total Assistance - Patient < 25%    Lower Body Dressing/Undressing Lower body dressing      What is the patient wearing?: Pants     Lower body assist Assist for lower body dressing: 2 Helpers     Toileting Toileting    Toileting assist Assist for toileting: Dependent - Patient 0% (cath )     Transfers Chair/bed transfer  Transfers assist  Chair/bed transfer activity did not occur: Safety/medical concerns (would recommend lift for patient- needed skilled intervention to transfer)  Chair/bed transfer assist level: 2 Helpers     Locomotion Ambulation   Ambulation assist   Ambulation activity did not occur: Safety/medical concerns          Walk 10 feet activity   Assist  Walk 10 feet activity did not occur: Safety/medical concerns        Walk 50 feet activity   Assist Walk 50  feet with 2 turns activity did not occur: Safety/medical concerns         Walk 150 feet activity   Assist  Walk 150 feet activity did not occur: Safety/medical concerns         Walk 10 feet on uneven surface  activity   Assist Walk 10 feet on uneven surfaces activity did not occur: Safety/medical concerns         Wheelchair     Assist Will patient use wheelchair at discharge?: Yes      Wheelchair assist level: Dependent - Patient 0% Max wheelchair distance: 150    Wheelchair 50 feet with 2 turns activity    Assist        Assist Level: Dependent - Patient 0%   Wheelchair 150 feet activity     Assist      Assist Level: Dependent - Patient 0%   Blood pressure 130/75, pulse 81, temperature 97.8 F (36.6 C), resp. rate 17, height $RemoveBe'5\' 5"'xRicaNogG$  (1.651 m), weight 77.7 kg, SpO2 99 %.  Medical Problem List and Plan: 1.Right side weakness and aphasiasecondary to left MCA territory infarction status post IR revascularization with SAH -patient may Shower if has good enough sitting balance -ELOS/Goals: 3-4 weeks- goals min A /mod A  Team conference today please see physician documentation under team conference tab, met with team  to discuss problems,progress, and goals. Formulized individual treatment plan based on medical history, underlying problem and comorbidities.  2. Antithrombotics: -DVT/anticoagulation:SCDs- had petechial hemrrhages -antiplatelet therapy: Aspirin 81 mg daily 3. Pain Management:will schedule  Tylenol q6 hours through NGT 4. Mood:Amantadine 100 mg daily, Zoloft 50 mg daily, Aricept 10 mg nightly-  -added 1200 dose of amantadine-->BID on 8/21  -consider ritalin trial if no sig increase in LOA with amantadine, f/u in conf tomorrow -antipsychotic agents: N/A 5. Neuropsych: This patientisNOTcapable of making decisions on herown behalf. 6. Skin/Wound Care:Routine skin checks 7.  Fluids/Electrolytes/Nutrition:Routine in and outs with follow-up chemistries, reduce TF to promote appetite If appetite not improved with level of alertness would trial megace  8. Dysphagia. Tube feeds as directed. Dietary follow-up. Speech therapy follow-up- has Cortrak- assess if things improves, or if needs PEG long term  -monitor intake as well as S/Sx of asp on po   -have asked RT for vest chest PT to help her mobilize secretions in chest in the short term.  9. Hypertension. Lopressor 50 mg twice daily, Cozaar 25 mg daily, Norvasc 10 mg daily.  Vitals:   09/13/19 0000 09/13/19 0441  BP: (!) 150/72 130/75  Pulse: 64 81  Resp: 17 17  Temp:  97.8 F (36.6 C)  SpO2:  99%   8/24 fair control  10. Hyperlipidemia. Lipitor 11. History of alcohol use. Provide counseling with patient and family 29. Atrial fibrillation. No anticoagulation at this time.   Continue beta-blocker  -HR controlled 8/21 13. Urinary retention/proteus UTI- discussed with daughter this may cause a temporary setback in recovery   -keflex $Remove'250mg'ePOmgYV$  TID started 8/21  -cx demonstrates >100K proteus S to keflex   -req I/O caths, volumes 350-450 cc overnight into this morning  -will try low dose urecholine to help with emptying- increased to $RemoveBefo'25mg'ojRIjoyIluX$  8/24  -purewick if needed to keep skin dry, but mostly retaining right now 14. Sleep-/insomnia- pt took 30, not 3 mg of Melatonin nightly at home AND 3+ pills of ZZZquil at home to sleep nightly per daughter- FYI    LOS: 6 days A FACE TO [place] E Azariah Latendresse 09/13/2019, 8:51 AM

## 2019-09-13 NOTE — Progress Notes (Signed)
Physical Therapy Session Note  Patient Details  Name: [name] MRN: [id number] Date of Birth: Jun 15, 1932  Today's Date: 09/13/2019 PT Individual Time: 0920-1000 and 1420-1500 PT Individual Time Calculation (min): 40 min and 40 min  Short Term Goals: Week 1:  PT Short Term Goal 1 (Week 1): Pt will maintain sitting balance EOB with mod assist up to 5 minutes PT Short Term Goal 2 (Week 1): Pt will initiate gait training PT Short Term Goal 3 (Week 1): Pt will demonstrate ability to remain aroused and engaged in therapy for >75% of session PT Short Term Goal 4 (Week 1): Pt will perform bed mobility with max assist  Skilled Therapeutic Interventions/Progress Updates: Pt presented in bed with dgt Dominica present agreeable to therapy. Pt awake initially at start of session however pt becoming more lethargic during session. Pt noted to be incontinent of bowels, performed rolling maxA x 1 to R total A to L. Pt also required total A for threading and performed rolling in same manner as prior to pull pants over hips. Pt required total A supine to sit at EOB with pt immediately leaning posteriorly once sitting EOB. Noted mild pushing with LUE to R which pt was unable to correct. Pt sat EOB for approx 5 min with maxA and max multimodal cues to lean anteriorly. Pt noted to begin falling asleep while sitting EOB with pt requiring max multimodal cues to arouse. Performed stand pivot transfer total A x 2 with pt snoring during middle of transfer. Pt repositioned in TIS at end of session and left with belt alarm on, call bell within reach and dgt present.   Tx2: Pt presented in bed with dgt present. Pt appeared more alert this session compared to am session. Pt noted to not have pants on, donned pants total A with pt able to initiate roll to R reaching for bed rails without cues. Pt also attempted to perform bridge with LLE only to pull pants over hips however was unable to clear sufficiently. Once pants donned  performed supine to sit maxA to sit EOB. Pt required maxA for unsupported sitting balance with posterior lean and verbal cues to increase anterior lean. Performed stand pivot transfer maxA x 2 for safety to TIS. Pt then transported to rehab gym and participated in STS at parallel bars. Pt required max verbal cues for forward lean and with PTA blocking R knee max cues to initiate. Once in full stand pt required max multimodal cues to improve anterior translation of hips and improve to erect posture. Pt was able to complete a total of 3 stands extended rest between stands. After stands pt participated in seated reaching activity from TIS with pt reaching for horseshoes encouraging forward reach. Pt initially required mod facilitation to reach horseshoe however required less facilitation and cues with repetition. Pt was unable to throw horseshoe despite max cues and rather dropped them to ground on all 12 attempts. Pt returned to room at end of session and left with belt alarm on, call bell within reach and needs met.      Therapy Documentation Precautions:  Precautions Precautions: Fall Precaution Comments: corerack; R inattention Restrictions Weight Bearing Restrictions: No General:   Vital Signs: Therapy Vitals Pulse Rate: 82 Resp: 17 BP: 131/64 Patient Position (if appropriate): Lying Oxygen Therapy SpO2: 98 % O2 Device: Room Air    Therapy/Group: Individual Therapy  Perri Aragones  Lizzeth Meder, PTA  09/13/2019, 3:46 PM

## 2019-09-13 NOTE — Progress Notes (Signed)
Patient ID: [name], female   DOB: April 11, 1932, 86 y.o.   MRN: [id number]   Team Conference Report to Patient/Family  Team Conference discussion was reviewed with the patient and caregiver, including goals, any changes in plan of care and target discharge date.  Patient and caregiver express understanding and are in agreement.  The patient has a target discharge date of 09/29/19 (SNF).  [name] 09/13/2019, 2:23 PM

## 2019-09-14 ENCOUNTER — Inpatient Hospital Stay (HOSPITAL_COMMUNITY): Payer: Medicare Other | Admitting: Occupational Therapy

## 2019-09-14 ENCOUNTER — Inpatient Hospital Stay (HOSPITAL_COMMUNITY): Payer: Medicare Other | Admitting: Speech Pathology

## 2019-09-14 ENCOUNTER — Inpatient Hospital Stay (HOSPITAL_COMMUNITY): Payer: Medicare Other | Admitting: Physical Therapy

## 2019-09-14 NOTE — Progress Notes (Signed)
RT stopped by pt's room for scheduled CPT. Pt asleep, appears to be resting comfortably. CPT held at this time.

## 2019-09-14 NOTE — Progress Notes (Signed)
Occupational Therapy Session Note  Patient Details  Name: [name] MRN: [id number] Date of Birth: 23-Jul-1932  Today's Date: 09/14/2019 OT Individual Time: 1000-1059 OT Individual Time Calculation (min): 59 min    Short Term Goals: Week 1:  OT Short Term Goal 1 (Week 1): Pt will sit EOB statically for ~5 min with supervison in prep for ADL task OT Short Term Goal 2 (Week 1): Pt will attend to familar ADL task demonstrating sustained attention for ~3 min with min cues OT Short Term Goal 3 (Week 1): Pt will don shirt with mod A OT Short Term Goal 4 (Week 1): Pt will in bed with mod A in prep for aDL tasks  Skilled Therapeutic Interventions/Progress Updates:  Patient met lying supine in bed with daughter at bedside. OT treatment session with focus on self-care re-education, bed mobility, functional transfers, and RUE NRM as detailed below. Patient incontinent of bowel requiring Max A for rolling R<>L. Dependent for toileting/hygiene/clothing management at LB dressing at bed level. Supine to EOB with Max A +2. Patient initially more awake/alert at start of session with ability to verbally choose between 2 shirts and 2 pairs of pants. After squat-pivot transfer to [place] wc, patient became more lethargic requiring increased cues for keeping eyes open. UB bathing/dressing seated at sink with Total A to promote trunk control and strengthening of postural musculature. Max A for forward leans to wash back and don shirt. Oral hygiene with suction toothbrush and Max A for thoroughness. To promote rest in prep for afternoon PT session, patient returned to supine via squat-pivot transfer with +2 assist. Saebo E-stim applied to R supraspinatus and anterior deltoid and removed after 1hr. Skin intact. Session concluded with patient lying supine in bed with call bell within reach, bed alarm activated, and all needs met.   Therapy Documentation Precautions:  Precautions Precautions: Fall Precaution Comments:  corerack; R inattention Restrictions Weight Bearing Restrictions: No General:    Therapy/Group: Individual Therapy  Adiyah Lame [name] 09/14/2019, 7:30 AM

## 2019-09-14 NOTE — Progress Notes (Signed)
[facility] PHYSICAL MEDICINE & REHABILITATION PROGRESS NOTE   Subjective/Complaints:  Discussed prognosis, medications , drying agents  ROS: limited due to language/communication    Objective:   No results found. No results for input(s): WBC, HGB, HCT, PLT in the last 72 hours. Recent Labs    09/13/19 0617  NA 131*  K 4.9  CL 95*  CO2 27  GLUCOSE 113*  BUN 26*  CREATININE 0.77  CALCIUM 9.3    Intake/Output Summary (Last 24 hours) at 09/14/2019 0825 Last data filed at 09/14/2019 0700 Gross per 24 hour  Intake 1250 ml  Output 1250 ml  Net 0 ml     Physical Exam: Vital Signs Blood pressure 127/74, pulse 78, temperature 97.8 F (36.6 C), temperature source Oral, resp. rate 18, height 5\' 5"  (1.651 m), weight 81.3 kg, SpO2 97 %. HEENT- cortrak in nares  General: No acute distress Mood and affect are appropriate Heart: Regular rate and rhythm no rubs murmurs or extra sounds Lungs: Clear to auscultation, breathing unlabored, no rales or wheezes Abdomen: Positive bowel sounds, soft nontender to palpation, nondistended Extremities: No clubbing, cyanosis, or edema Skin: No evidence of breakdown, no evidence of rash Neurologic: somnolent not following commands this am   Musculoskeletal:No joint swelling   Assessment/Plan: 1. Functional deficits secondary to Left BG infarct  which require 3+ hours per day of interdisciplinary therapy in a comprehensive inpatient rehab setting.  Physiatrist is providing close team supervision and 24 hour management of active medical problems listed below.  Physiatrist and rehab team continue to assess barriers to discharge/monitor patient progress toward functional and medical goals  Care Tool:  Bathing        Body parts bathed by helper: Right arm, Right lower leg, Left arm, Chest, Left lower leg, Face, Abdomen, Front perineal area, Buttocks, Right upper leg, Left upper leg     Bathing assist Assist Level: 2 Helpers     Upper  Body Dressing/Undressing Upper body dressing   What is the patient wearing?: Pull over shirt    Upper body assist Assist Level: Total Assistance - Patient < 25%    Lower Body Dressing/Undressing Lower body dressing      What is the patient wearing?: Pants     Lower body assist Assist for lower body dressing: 2 Helpers     Toileting Toileting    Toileting assist Assist for toileting: Dependent - Patient 0% (cath )     Transfers Chair/bed transfer  Transfers assist  Chair/bed transfer activity did not occur: Safety/medical concerns (would recommend lift for patient- needed skilled intervention to transfer)  Chair/bed transfer assist level: 2 Helpers     Locomotion Ambulation   Ambulation assist   Ambulation activity did not occur: Safety/medical concerns          Walk 10 feet activity   Assist  Walk 10 feet activity did not occur: Safety/medical concerns        Walk 50 feet activity   Assist Walk 50 feet with 2 turns activity did not occur: Safety/medical concerns         Walk 150 feet activity   Assist Walk 150 feet activity did not occur: Safety/medical concerns         Walk 10 feet on uneven surface  activity   Assist Walk 10 feet on uneven surfaces activity did not occur: Safety/medical concerns         Wheelchair     Assist Will patient use wheelchair at discharge?: Yes  Wheelchair assist level: Dependent - Patient 0% Max wheelchair distance: 150    Wheelchair 50 feet with 2 turns activity    Assist        Assist Level: Dependent - Patient 0%   Wheelchair 150 feet activity     Assist      Assist Level: Dependent - Patient 0%   Blood pressure 127/74, pulse 78, temperature 97.8 F (36.6 C), temperature source Oral, resp. rate 18, height 5\' 5"  (1.651 m), weight 81.3 kg, SpO2 97 %.  Medical Problem List and Plan: 1.Right side weakness and aphasiasecondary to left MCA territory infarction  status post IR revascularization with [facility] -patient may Shower if has good enough sitting balance -ELOS/Goals: 3-4 weeks- goals min A /mod A    2. Antithrombotics: -DVT/anticoagulation:SCDs- had petechial hemrrhages -antiplatelet therapy: Aspirin 81 mg daily 3. Pain Management:will schedule  Tylenol q6 hours through NGT 4. Mood:Amantadine 100 mg daily, Zoloft 50 mg daily, Aricept 10 mg nightly-  -added 1200 dose of amantadine-->BID on 8/21  -consider ritalin trial if no sig increase in LOA with amantadine, f/u in conf tomorrow -antipsychotic agents: N/A 5. Neuropsych: This patientisNOTcapable of making decisions on herown behalf. 6. Skin/Wound Care:Routine skin checks 7. Fluids/Electrolytes/Nutrition:Routine in and outs with follow-up chemistries, reduce TF to promote appetite If appetite not improved with level of alertness would trial megace  8. Dysphagia. Tube feeds as directed. Dietary follow-up. Speech therapy follow-up- has Cortrak- assess if things improves, or if needs PEG long term  -monitor intake as well as S/Sx of asp on po   -have asked RT for vest chest PT to help her mobilize secretions in chest in the short term.  9. Hypertension. Lopressor 50 mg twice daily, Cozaar 25 mg daily, Norvasc 10 mg daily.  Vitals:   09/14/19 0052 09/14/19 0500  BP: 124/61 127/74  Pulse: 61 78  Resp:    Temp: 97.8 F (36.6 C) 97.8 F (36.6 C)  SpO2: 97% 97%   8/26 fair control  10. Hyperlipidemia. Lipitor 11. History of alcohol use. Provide counseling with patient and family 54. Atrial fibrillation. No anticoagulation at this time.   Continue beta-blocker  -HR controlled 8/21 13. Urinary retention/proteus UTI- discussed with daughter this may cause a temporary setback in recovery   -keflex 250mg  TID started 8/21  -cx demonstrates >100K proteus S to keflex   -req I/O caths, volumes 350-450 cc overnight into this  morning  -will try low dose urecholine to help with emptying- increased to 25mg  8/24  -purewick if needed to keep skin dry, but mostly retaining right now 14. Sleep-/insomnia- pt took 30, not 3 mg of Melatonin nightly at home AND 3+ pills of ZZZquil at home to sleep nightly per daughter- FYI    LOS: 7 days A FACE TO [place] E Alexys Gassett 09/14/2019, 8:25 AM

## 2019-09-14 NOTE — Progress Notes (Signed)
Physical Therapy Session Note  Patient Details  Name: [name] MRN: [id number] Date of Birth: May 15, 1932  Today's Date: 09/14/2019 PT Individual Time: 1420-1535 PT Individual Time Calculation (min): 75 min   Short Term Goals: Week 1:  PT Short Term Goal 1 (Week 1): Pt will maintain sitting balance EOB with mod assist up to 5 minutes PT Short Term Goal 2 (Week 1): Pt will initiate gait training PT Short Term Goal 3 (Week 1): Pt will demonstrate ability to remain aroused and engaged in therapy for >75% of session PT Short Term Goal 4 (Week 1): Pt will perform bed mobility with max assist  Skilled Therapeutic Interventions/Progress Updates:   Pt received supine in bed and agreeable to PT. Rolling R and L to ensure clean breif per daughter request. Max assist to the L total A to the R. Sit>supine with total A from PT on the R side of the bed and mzx cues for improved trunkal activation and neuromotor control. Stand pivot transfer to [facility] with max assist from PT to prevent R lateral L, improved midline orientation and to block the RLE. Pt transported to orthogym. Standing tolerance/midline orientation in standing frame 2 bouts x 5 min each. Max cues for decreased pushers syndrome, as well as activation of the RLE to sustain neutral pelvic position. Forward reaches with the RUE to prevent pushing through table. Squat pivot transfer to and from nustep with max assist and RLE blocked by PT. nustep reciprocal movement training 4 min +4 min with cues for full ROM, attention to the RLE as well as initiation of movement. Sit<>stand at rail in hall with max assist from PT and trunk/RLE blocked by PT. Gait training with DF wrap to the RLE as well as total A +2 for WC follow, R LE advancement and blocking in stance. With fatigue noted increased pushers syndrome. Patient returned to room and left sitting in [facility] with call bell in reach and all needs met.         Therapy Documentation Precautions:   Precautions Precautions: Fall Precaution Comments: corerack; R inattention Restrictions Weight Bearing Restrictions: No Pain: denies  Therapy/Group: Individual Therapy  Lorie Phenix 09/14/2019, 3:42 PM

## 2019-09-14 NOTE — Progress Notes (Addendum)
Speech Language Pathology Weekly Progress and Session Note  Patient Details  Name: [name] MRN: [id number] Date of Birth: Jan 04, 1933  Beginning of progress report period: September 08, 2019 End of progress report period: September 15, 2019  Today's Date: 09/14/2019 SLP Individual Time: 0730-0829 SLP Individual Time Calculation (min): 59 min  Short Term Goals: Week 1: SLP Short Term Goal 1 (Week 1): Pt will participate in PO trials of ice, thin H2O, and puree at bedside to demonstrate readiness for MBSS prior to initiation of PO diet. SLP Short Term Goal 1 - Progress (Week 1): Met SLP Short Term Goal 2 (Week 1): Pt will communicate basic wants and needs at the word level with Max A verbal and/or visual cues. SLP Short Term Goal 2 - Progress (Week 1): Met SLP Short Term Goal 3 (Week 1): Pt will name common objects with 60% accuracy provided Max A multimodal cues. SLP Short Term Goal 3 - Progress (Week 1): Met SLP Short Term Goal 4 (Week 1): Pt will answer basic yes/no questions to in response to wants/needs and basic biographical information with 60% accuracy with Max A mulimodal cues. SLP Short Term Goal 4 - Progress (Week 1): Progressing toward goal SLP Short Term Goal 5 (Week 1): Pt will demonstrate awareness of functional and/or verbal errors with Max A multimodal cues. SLP Short Term Goal 5 - Progress (Week 1): Progressing toward goal SLP Short Term Goal 6 (Week 1): Pt will sustain attention to basic functional tasks or 10 miunte intervals with Mod A multimodal cues for redirection. SLP Short Term Goal 6 - Progress (Week 1): Met    New Short Term Goals: Week 2: SLP Short Term Goal 1 (Week 2): Pt will consume current diet with Mod A verbal/visual cues for implementation of compensatory swallow strategies for oral clearance and minimize aspiration risk. SLP Short Term Goal 2 (Week 2): Pt will demonstrate efficient mastication and oral clearance of Dys 2 solid trials a minimum of 2  times prior to diet advancement. SLP Short Term Goal 3 (Week 2): Pt will communicate basic wants and needs at the word level with Mod A verbal and/or visual cues. SLP Short Term Goal 4 (Week 2): Pt will name common objects with 80%  accuracy provided Mod A multimodal cues. SLP Short Term Goal 5 (Week 2): Pt will match words to common objects with 60% accuracy with Max A multimodal cues. SLP Short Term Goal 6 (Week 2): Pt will demonstrate awareness of functional and/or verbal errors with Max A multimodal cues.  Weekly Progress Updates: Pt has made functional gains and met 4 out of 6 short term goals. Pt is currently Max assist for functional communication due to mixed expressive and receptive aphasia and dysarthria. Pt also has moderately severe cognitive impairments impacting her attention, problem solving, and awareness. In addition, her level of alertness fluctuates, which has limited her participation at times. Her overall endurance is poor but improving. Pt has mild-mod oropharyngeal dysphagia due to deconditioning and swallow initiation timing impairments; she did participate in an MBSS this week and a Dysphagia 1 (puree) nectar thick liquid diet was initiated (upgraded from NPO). Leonette Nutting is still present to provide supplemental nutrition at this time, however pt is working toward increasing PO intake for NG tube removal. Pt and family education is ongoing. Pt would continue to benefit from skilled ST while inpatient in order to maximize functional independence and reduce burden of care prior to discharge. Anticipate that pt will need 24/7  supervision at discharge in addition to [place] follow up at next level of care, and SNF placement is likely due to severity of pt's deficits and level of physical and cognitive-linguistic care/assistance she will require.      Intensity: Minumum of 1-2 x/day, 30 to 90 minutes Frequency: 3 to 5 out of 7 days Duration/Length of Stay: 09/29/19 - SNF placement  anticipated Treatment/Interventions: Cognitive remediation/compensation;Cueing hierarchy;Dysphagia/aspiration precaution training;Functional tasks;Patient/family education;Therapeutic Activities;Internal/external aids;Environmental controls;Multimodal communication approach;Speech/Language facilitation   Daily Session Skilled Therapeutic Interventions: Pt was seen for skilled ST targeting dysphagia and communication goals. [name] Dundee Cellar present for session - SLP provided education and training regarding safe swallow precautions and pt's compensatory strategies to provide full supervision during meals today. Pt consumed purees from breakfast tray without overt s/sx aspiration and achieved good oral clearance and seemingly efficient oral transit of boluses. Improvements in independence with self-feeding noted today in comparison to yesterday - Only Min A provided. Moderate verbal cues were provided for implementation of swallow strategy (extra dry swallow) throughout intake. Pt with increase in cough response with sips of nectar juice via straw and cup today at beginning of session (cough X4). Honey thick liquids trialed at bedside - no overt s/sx aspiration observed with honey. When switched back to nectar at end of meal no coughing noted. Would recommend pt continue current diet, however if pt continues to exhibit consistent cough with nectar, please switch liquid consistency to honey thick. ST will follow up tomorrow to reassess ease of intake over the next 24 hours and determine whether further diet modifications and/or repeat instrumental testing is warranted.  Communication tasks focused on yes/no responses to communicate basic wants and needs and identify objects, as well as receptive language skills. Pt required Max A multimodal cues to match written word to objects from field of 2 with 50% accuracy. Pt used head nods and verbal yes/no responses to questions regarding preferences with seemingly  90% accuracy throughout session, but yes/no responses in response to object identification questions decreased to ~30% accuracy. Pt with increasing lethargy throughout session, eventually requiring Max A multimodal cues to maintain arousal for participation. When fully alert, pt did attend to functional and strcutured task with no more than Min A verbal cues for redirection. Pt left laying in bed with alarm set and needs within reach. Continue per current plan of care.       Pain Pain Assessment Pain Score: 0-No pain  Therapy/Group: Individual Therapy  Arbutus Leas 09/14/2019, 7:18 AM

## 2019-09-14 NOTE — Progress Notes (Signed)
Patient ID: [name], female   DOB: [birth year], 86 y.o.   MRN: [id number]   Pt conference updates sent over to [facility] and [place]: [email or web address]

## 2019-09-15 ENCOUNTER — Inpatient Hospital Stay (HOSPITAL_COMMUNITY): Payer: Medicare Other | Admitting: Occupational Therapy

## 2019-09-15 ENCOUNTER — Inpatient Hospital Stay (HOSPITAL_COMMUNITY): Payer: Medicare Other

## 2019-09-15 ENCOUNTER — Inpatient Hospital Stay (HOSPITAL_COMMUNITY): Payer: Medicare Other | Admitting: Speech Pathology

## 2019-09-15 ENCOUNTER — Inpatient Hospital Stay (HOSPITAL_COMMUNITY): Payer: Medicare Other | Admitting: Physical Therapy

## 2019-09-15 LAB — GLUCOSE, CAPILLARY: Glucose-Capillary: 130 mg/dL — ABNORMAL HIGH (ref 70–99)

## 2019-09-15 NOTE — Progress Notes (Signed)
Occupational Therapy Weekly Progress Note  Patient Details  Name: [name] MRN: [id number] Date of Birth: [birth year]  Beginning of progress report period: September 08, 2019 End of progress report period: September 15, 2019  Today's Date: 09/15/2019 OT Individual Time: 4132-4401 OT Individual Time Calculation (min): 59 min    Patient has met 1 of 4 short term goals. Patient making slow yet steady progress toward goals. Patient able to maintain unsupported static sitting balance with supervision A best and Mod A at most. Patient also demonstrates ability to attend to 1 grooming task for more than a few seconds when lethargy isn't a barrier. Seated at sink level in TIS wc, patient able to complete 1/4 parts of UB dressing task with increased ability to follow 1-step verbal commands consistently and accurately. Progress limited by lethargy and fatigue this period with patient unable to keep eyes open for more than a few seconds at a time during multiple treatment sessions.   Patient continues to demonstrate the following deficits: muscle weakness, decreased cardiorespiratoy endurance, impaired timing and sequencing, unbalanced muscle activation, motor apraxia and decreased coordination, left inattention, decreased midline orientation, decreased initiation, decreased attention, decreased awareness, decreased problem solving, decreased safety awareness and delayed processing and decreased sitting balance, decreased standing balance, decreased postural control, hemiplegia and decreased balance strategies and therefore will continue to benefit from skilled OT intervention to enhance overall performance with BADL.  Patient progressing toward long term goals..  Continue plan of care.  OT Short Term Goals Week 1:  OT Short Term Goal 1 (Week 1): Pt will sit EOB statically for ~5 min with supervison in prep for ADL task OT Short Term Goal 1 - Progress (Week 1): Progressing toward goal OT Short Term Goal 2  (Week 1): Pt will attend to familar ADL task demonstrating sustained attention for ~3 min with min cues OT Short Term Goal 2 - Progress (Week 1): Progressing toward goal OT Short Term Goal 3 (Week 1): Pt will don shirt with mod A OT Short Term Goal 3 - Progress (Week 1): Progressing toward goal OT Short Term Goal 4 (Week 1): Pt will in bed with mod A in prep for aDL tasks OT Short Term Goal 4 - Progress (Week 1): Progressing toward goal Week 2:  OT Short Term Goal 1 (Week 2): Pt will sit EOB statically for ~5 min with supervison in prep for ADL task OT Short Term Goal 2 (Week 2): Pt will attend to familar ADL task demonstrating sustained attention for ~3 min with min cues OT Short Term Goal 3 (Week 2): Pt will don shirt with mod A in supported sitting at sink level OT Short Term Goal 4 (Week 2): Pt will roll in bed with mod A in prep for ADL tasks  Skilled Therapeutic Interventions/Progress Updates:  Patient met lying supine in bed in agreement with OT treatment session. Rolling R<>L in supine with Max A in prep for LB dressing. Max A +2 required to don LB clothing at bed level with patient able to lift L leg in prep for threading. Patient more alert and awake this session with ability to follow 1-step verbal commands with 50% accuracy, increased time for processing, and repeat reminders. Supine to EOB with Max A and squat-pivot transfer to TIS wc with Max A +2. Seated at sink level patient able to complete 1/4 parts of UB dressing task with multimodal cues. Total A for wc transport to therapy gym. Seated on mat table, patient able  to maintain static sitting balance with visual feedback from mirror, Min A at best and Mod A at most with multimodal cues for midline orientation and weightbearing through RUE. Sit to stand x3 trials with Max-Total A +2, R knee block, and guarding of RUE to maintain integrity of shoulder joint. Session concluded with patient seated in TIS wc with call bell within reach, belt  alarm activated, and daughter present at bedside.   Therapy Documentation Precautions:  Precautions Precautions: Fall Precaution Comments: corerack; R inattention Restrictions Weight Bearing Restrictions: No General:     Therapy/Group: Individual Therapy  Tamyia Minich [name] 09/15/2019, 7:31 AM

## 2019-09-15 NOTE — Progress Notes (Signed)
[facility] PHYSICAL MEDICINE & REHABILITATION PROGRESS NOTE   Subjective/Complaints:  Discussed swallowing with patient's daughter.  Patient is sitting up with the assistance of physical therapy, min assist static sitting.  Pushes at times toward the right side. Remains severely aphasic has difficulty following basic commands  ROS: limited due to language/communication    Objective:   No results found. No results for input(s): WBC, HGB, HCT, PLT in the last 72 hours. Recent Labs    09/13/19 0617  NA 131*  K 4.9  CL 95*  CO2 27  GLUCOSE 113*  BUN 26*  CREATININE 0.77  CALCIUM 9.3    Intake/Output Summary (Last 24 hours) at 09/15/2019 1353 Last data filed at 09/15/2019 1300 Gross per 24 hour  Intake 159 ml  Output 1550 ml  Net -1391 ml     Physical Exam: Vital Signs Blood pressure 130/76, pulse 82, temperature 98 F (36.7 C), temperature source Oral, resp. rate 16, height 5\' 5"  (1.651 m), weight 77.6 kg, SpO2 98 %. HEENT- cortrak in nares  General: No acute distress Mood and affect are appropriate Heart: Regular rate and rhythm no rubs murmurs or extra sounds Lungs: Clear to auscultation, breathing unlabored, no rales or wheezes Abdomen: Positive bowel sounds, soft nontender to palpation, nondistended Extremities: No clubbing, cyanosis, or edema Skin: No evidence of breakdown, no evidence of rash Neurologic: 4/5 in the left deltoid bicep tricep grip hip flexion knee extension ankle flexion Medical extremity without active movement right lower extremity has trace ankle plantarflexion  Musculoskeletal:No joint swelling   Assessment/Plan: 1. Functional deficits secondary to Left BG infarct  which require 3+ hours per day of interdisciplinary therapy in a comprehensive inpatient rehab setting.  Physiatrist is providing close team supervision and 24 hour management of active medical problems listed below.  Physiatrist and rehab team continue to assess barriers to  discharge/monitor patient progress toward functional and medical goals  Care Tool:  Bathing        Body parts bathed by helper: Right arm, Right lower leg, Left arm, Chest, Left lower leg, Face, Abdomen, Front perineal area, Buttocks, Right upper leg, Left upper leg     Bathing assist Assist Level: 2 Helpers     Upper Body Dressing/Undressing Upper body dressing   What is the patient wearing?: Pull over shirt    Upper body assist Assist Level: Maximal Assistance - Patient 25 - 49%    Lower Body Dressing/Undressing Lower body dressing      What is the patient wearing?: Pants     Lower body assist Assist for lower body dressing: 2 Helpers     Toileting Toileting    Toileting assist Assist for toileting: Dependent - Patient 0% (cath )     Transfers Chair/bed transfer  Transfers assist  Chair/bed transfer activity did not occur: Safety/medical concerns (would recommend lift for patient- needed skilled intervention to transfer)  Chair/bed transfer assist level: 2 Helpers     Locomotion Ambulation   Ambulation assist   Ambulation activity did not occur: Safety/medical concerns          Walk 10 feet activity   Assist  Walk 10 feet activity did not occur: Safety/medical concerns        Walk 50 feet activity   Assist Walk 50 feet with 2 turns activity did not occur: Safety/medical concerns         Walk 150 feet activity   Assist Walk 150 feet activity did not occur: Safety/medical concerns  Walk 10 feet on uneven surface  activity   Assist Walk 10 feet on uneven surfaces activity did not occur: Safety/medical concerns         Wheelchair     Assist Will patient use wheelchair at discharge?: Yes      Wheelchair assist level: Dependent - Patient 0% Max wheelchair distance: 150    Wheelchair 50 feet with 2 turns activity    Assist        Assist Level: Dependent - Patient 0%   Wheelchair 150 feet activity      Assist      Assist Level: Dependent - Patient 0%   Blood pressure 130/76, pulse 82, temperature 98 F (36.7 C), temperature source Oral, resp. rate 16, height 5\' 5"  (1.651 m), weight 77.6 kg, SpO2 98 %.  Medical Problem List and Plan: 1.Right side weakness and aphasiasecondary to left MCA territory infarction status post IR revascularization with [facility] -patient may Shower if has good enough sitting balance -ELOS/Goals: 3-4 weeks- goals min A /mod A    2. Antithrombotics: -DVT/anticoagulation:SCDs- had petechial hemrrhages -antiplatelet therapy: Aspirin 81 mg daily 3. Pain Management:will schedule  Tylenol q6 hours through NGT 4. Mood:Zoloft 50 mg daily, Aricept, February 9 10 mg nightly-  -Methylphenidate 5 mg twice daily, may increase to 10 mg next week low yes of visit   -antipsychotic agents: N/A 5. Neuropsych: This patientisNOTcapable of making decisions on herown behalf. 6. Skin/Wound Care:Routine skin checks 7. Fluids/Electrolytes/Nutrition:Routine in and outs with follow-up chemistries, reduce TF to promote appetite If appetite not improved with level of alertness would trial megace  8. Dysphagia. Tube feeds as directed. Dietary follow-up. Speech therapy follow-up- has Cortrak- assess if things improves, or if needs PEG long term  -monitor intake as well as S/Sx of asp on po   -have asked RT for vest chest PT to help her mobilize secretions in chest in the short term.  9. Hypertension. Lopressor 50 mg twice daily, Cozaar 25 mg daily, Norvasc 10 mg daily.  Vitals:   09/15/19 0435 09/15/19 1256  BP: (!) 159/83 130/76  Pulse: 71 82  Resp: 16 16  Temp:  98 F (36.7 C)  SpO2: 99% 98%   8/26 fair control  10. Hyperlipidemia. Lipitor 11. History of alcohol use. Provide counseling with patient and family 109. Atrial fibrillation. No anticoagulation at this time.   Continue beta-blocker  -HR  controlled 8/21 13. Urinary retention/proteus UTI- discussed with daughter this may cause a temporary setback in recovery   -keflex 250mg  TID started 8/21  -cx demonstrates >100K proteus S to keflex   -req I/O caths, volumes 350-450 cc overnight into this morning  -will try low dose urecholine to help with emptying- increased to 25mg  8/24  -purewick if needed to keep skin dry, but mostly retaining right now 14. Sleep-/insomnia- pt took 30, not 3 mg of Melatonin nightly at home AND 3+ pills of ZZZquil at home to sleep nightly per daughter- FYI    LOS: 8 days A FACE TO FACE EVALUATION WAS PERFORMED  Charlett Blake 09/15/2019, 1:53 PM

## 2019-09-15 NOTE — Progress Notes (Signed)
Chaplain continued relationship of care and concern for patient and her daughters.  Chaplain grateful for nursing staff going above and beyond the call of duty to arrange for visit from patient's priest later today.    God Bless!  [place]

## 2019-09-15 NOTE — Progress Notes (Signed)
Speech Language Pathology Daily Session Note  Patient Details  Name: [name] MRN: [id number] Date of Birth: 02-13-1932  Today's Date: 09/15/2019 SLP Individual Time: 0375-4360 SLP Individual Time Calculation (min): 55 min  Short Term Goals: Week 2: SLP Short Term Goal 1 (Week 2): Pt will consume current diet with Mod A verbal/visual cues for implementation of compensatory swallow strategies for oral clearance and minimize aspiration risk. SLP Short Term Goal 2 (Week 2): Pt will demonstrate efficient mastication and oral clearance of Dys 2 solid trials a minimum of 2 times prior to diet advancement. SLP Short Term Goal 3 (Week 2): Pt will communicate basic wants and needs at the word level with Mod A verbal and/or visual cues. SLP Short Term Goal 4 (Week 2): Pt will name common objects with 80%  accuracy provided Mod A multimodal cues. SLP Short Term Goal 5 (Week 2): Pt will match words to common objects with 60% accuracy with Max A multimodal cues. SLP Short Term Goal 6 (Week 2): Pt will demonstrate awareness of functional and/or verbal errors with Max A multimodal cues.  Skilled Therapeutic Interventions: Skilled treatment session focused on dysphagia and communication goals. SLP facilitated session by providing skilled observation with nectar-thick liquids. Patient's daughter reports minimal intake of liquids, suspect due to increased frustration with severe right anterior spillage. Therefore, patient was provided a plastic spoon which is deeper than a metal spoon. Patient was able to consume several sips with minimal anterior spillage and without overt s/s of aspiration. Therefore, recommend patient consume liquids via tsp at this time to maximize intake. Patient's daughter verbalized understanding. SLP also facilitated session with a phrase completion task that utilized written and visual cues. Patient initially required Max A to complete phrase, however, by end of session, patient was  completing phrases with 80% accuracy and overall Min A verbal cues. Min verbal cues were also required for a slow rate of speech to maximize intelligibility and accuracy. Patient left upright in bed with daughter present, alarm on and all needs within reach. Continue with current plan of care.      Pain No/Denies Pain   Therapy/Group: Individual Therapy  Christabella Alvira 09/15/2019, 3:16 PM

## 2019-09-15 NOTE — Progress Notes (Signed)
Physical Therapy Session Note  Patient Details  Name: [name] MRN: [id number] Date of Birth: 1932/06/05  Today's Date: 09/15/2019 PT Individual Time: 0830-0900 PT Individual Time Calculation (min): 30 min   Short Term Goals: Week 1:  PT Short Term Goal 1 (Week 1): Pt will maintain sitting balance EOB with mod assist up to 5 minutes PT Short Term Goal 1 - Progress (Week 1): Met PT Short Term Goal 2 (Week 1): Pt will initiate gait training PT Short Term Goal 2 - Progress (Week 1): Met PT Short Term Goal 3 (Week 1): Pt will demonstrate ability to remain aroused and engaged in therapy for >75% of session PT Short Term Goal 3 - Progress (Week 1): Progressing toward goal PT Short Term Goal 4 (Week 1): Pt will perform bed mobility with max assist PT Short Term Goal 4 - Progress (Week 1): Progressing toward goal Week 2:  PT Short Term Goal 1 (Week 2): Pt will consistently transfer to and from [facility] with max assist of 1 PT Short Term Goal 2 (Week 2): Pt will initiate WC mobility training PT Short Term Goal 3 (Week 2): Pt will sit EOB with min assist from PT while engaged in functional task. Week 3:     Skilled Therapeutic Interventions/Progress Updates:    PAIN  Pt nods "no". Pt initially sleeping but easily aroused.  Daughter at bedside.   Pt rolls L, side to sit w/hand over hand facilitation, tactile and visual cues, dependent for transition. Once in sitting, pt pushes w/LUE moderately/leans to R requiring min to max assist during session.  In sitting, performed reaching to L to obtain pegs then placing pegs at random in peg board placed on bedside table in front of pt. Hand over hand assist required to initiate and at times to complete task.  Sat x 20 min and performed sitting balance/peg board/MD in to assess pt while engaged in tasks.  Sit to supine w/max assist, pt leans to L elbow and assists in raising LLE to bed.   In supine performed bridging w/therapist stabilizing RLE, tactile  cues to initiate/repeat.  Noted R hip abductor activation w/activity.    In bridge position performed hip abd/ER - add/IR w/noted eccentric controlled lowering of RLE w/task.  Pt repositioned comfortably.  Daughter w/questions re subacute rehab/discussed. Pt left supine w/rails up x 4, alarm set, bed in lowest position, and needs in reach.  Therapy Documentation Precautions:  Precautions Precautions: Fall Precaution Comments: corerack; R inattention Restrictions Weight Bearing Restrictions: No .   Therapy/Group: Individual Therapy  Callie Fielding, PT   Jerrilyn Cairo 09/15/2019, 1:15 PM

## 2019-09-15 NOTE — Progress Notes (Signed)
Physical Therapy Weekly Progress Note  Patient Details  Name: [name] MRN: [id number] Date of Birth: 07-31-1932  Beginning of progress report period: September 08, 2019 End of progress report period: September 15, 2019  Today's Date: 09/15/2019 PT Individual Time: 1105-1200 PT Individual Time Calculation (min): 55 min   Patient has met 2 of 4 short term goals.  Pt is making slow progress towards Mod assist overall. 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   Patient continues to demonstrate the following deficits muscle weakness, muscle joint tightness and muscle paralysis, decreased cardiorespiratoy endurance, impaired timing and sequencing, abnormal tone, unbalanced muscle activation, motor apraxia, decreased coordination and decreased motor planning, field cut, decreased midline orientation and decreased attention to right, decreased initiation, decreased attention, decreased awareness, decreased problem solving, decreased safety awareness, decreased memory and delayed processing and decreased sitting balance, decreased standing balance, decreased postural control, hemiplegia and decreased balance strategies and therefore will continue to benefit from skilled PT intervention to increase functional independence with mobility.  Patient progressing toward long term goals..  Continue plan of care.  PT Short Term Goals Week 1:  PT Short Term Goal 1 (Week 1): Pt will maintain sitting balance EOB with mod assist up to 5 minutes PT Short Term Goal 1 - Progress (Week 1): Met PT Short Term Goal 2 (Week 1): Pt will initiate gait training PT Short Term Goal 2 - Progress (Week 1): Met PT Short Term Goal 3 (Week 1): Pt will demonstrate  ability to remain aroused and engaged in therapy for >75% of session PT Short Term Goal 3 - Progress (Week 1): Progressing toward goal PT Short Term Goal 4 (Week 1): Pt will perform bed mobility with max assist PT Short Term Goal 4 - Progress (Week 1): Progressing toward goal Week 2:  PT Short Term Goal 1 (Week 2): Pt will consistently transfer to and from [facility] with max assist of 1 PT Short Term Goal 2 (Week 2): Pt will initiate WC mobility training PT Short Term Goal 3 (Week 2): Pt will sit EOB with min assist from PT while engaged in functional task.  Skilled Therapeutic Interventions/Progress Updates:   Pt received sitting in WC and agreeable to PT. Pt transported to rehab gym in [facility]. SB transfer to and from mat table with max assist with +2 for safety and WC/SB stability. Max multimodal cues for improved midline orientation, sequencing and activation of R side trunkal musculature to improve lateral translation to the L. Lateral reaches to the L 2 x 5 with max cues for initiation to force Weight shift to the L and improve midline orientation. Sit<>stand from EOM with max-total A  X 4 with LUE support on HW. Standing balance instructed by PT with visual feedback from mirror and max assist from PT to prevent RLE from buckling and prevent lateral lean to the R. Attempted to perform stepping with the LLE to force WB through the RLE, but pt unable to initiate movement due to FOF. Pt then performed vertical reach from [facility] to touch LUE to target x 8 While PT provided max assist to improve lateral lean to the R. Squat pivot tranfers to and from [facility] with max assist of 1 with +2 present for safety.  Max cues for set up and sequencing to prevent lateral LOB. Total A x1 from PT to prevent lateral LOB once sitting on nustep as pt attempting to perform posterior scoot to reposition on seat. nustep reciprocal movement training x 8 minutes and mod-max assist to initiate each step with max cues for sustained attention to the  RLE.  Patient returned to room and left sitting in [facility] with call bell in reach and all needs met.        Therapy Documentation Precautions:  Precautions Precautions: Fall Precaution Comments: corerack; R inattention Restrictions Weight Bearing Restrictions: No    Vital Signs: Therapy Vitals Pulse Rate: 71 Resp: 16 BP: (!) 159/83 Patient Position (if appropriate): Sitting Oxygen Therapy SpO2: 99 % O2 Device: Room Air Pain: Pain Assessment Pain Score: 0-No pain    Therapy/Group: Individual Therapy  Lorie Phenix 09/15/2019, 8:00 AM

## 2019-09-16 ENCOUNTER — Inpatient Hospital Stay (HOSPITAL_COMMUNITY): Payer: Medicare Other | Admitting: *Deleted

## 2019-09-16 ENCOUNTER — Inpatient Hospital Stay (HOSPITAL_COMMUNITY): Payer: Medicare Other

## 2019-09-16 NOTE — Progress Notes (Signed)
Speech Language Pathology Daily Session Note  Patient Details  Name: [name] MRN: [id number] Date of Birth: Sep 17, 1932  Today's Date: 09/16/2019 SLP Individual Time: 0935-1000 SLP Individual Time Calculation (min): 25 min  Short Term Goals: Week 2: SLP Short Term Goal 1 (Week 2): Pt will consume current diet with Mod A verbal/visual cues for implementation of compensatory swallow strategies for oral clearance and minimize aspiration risk. SLP Short Term Goal 2 (Week 2): Pt will demonstrate efficient mastication and oral clearance of Dys 2 solid trials a minimum of 2 times prior to diet advancement. SLP Short Term Goal 3 (Week 2): Pt will communicate basic wants and needs at the word level with Mod A verbal and/or visual cues. SLP Short Term Goal 4 (Week 2): Pt will name common objects with 80%  accuracy provided Mod A multimodal cues. SLP Short Term Goal 5 (Week 2): Pt will match words to common objects with 60% accuracy with Max A multimodal cues. SLP Short Term Goal 6 (Week 2): Pt will demonstrate awareness of functional and/or verbal errors with Max A multimodal cues.  Skilled Therapeutic Interventions:Skilled ST services focused on language and swallow skills. [name] present for treatment and reported increase lethargy after just completing breakfast. SLP facilitated naming of common objects, pt named 1 out 5 objects increasing to 3 out 5 objects with max A sentence completion and phonemic cues over several trials. SLP also facilitated PO consumption of nectar thick liquids via TSP, pt consumed initial 3 demonstrating timely swallow and no overt s/s aspiration, however the 4th trial resulted in an immediate cough and continuous wheezing when inhaling. SLP notified nurse, who assessed lung function indicating no congestion. Pt eventual stopped producing a wheeze after another minute had pasted. SLP reassessed swallow function providing trials of HTL via tsp and then nectar thick  liquids via TSP with no overt s/s aspiration. SLP recommends to continue nectar thick liquids and only providing PO consumption when pt is very alert. Nurse and pt's daughter stated understanding.  Pt was left in room with daughter, call bell within reach and bed alarm set. SLP recommends to continue skilled services.     Pain Pain Assessment Pain Score: 0-No pain  Therapy/Group: Individual Therapy  Sophiarose Eades  [facility] 09/16/2019, 4:32 PM

## 2019-09-16 NOTE — Progress Notes (Signed)
Physical Therapy Session Note  Patient Details  Name: [name] MRN: [id number] Date of Birth: [birth year]  Today's Date: 09/16/2019 PT Individual Time: 1420-1459 PT Individual Time Calculation (min): 39 min   Short Term Goals: Week 2:  PT Short Term Goal 1 (Week 2): Pt will consistently transfer to and from [facility] with max assist of 1 PT Short Term Goal 2 (Week 2): Pt will initiate WC mobility training PT Short Term Goal 3 (Week 2): Pt will sit EOB with min assist from PT while engaged in functional task.  Skilled Therapeutic Interventions/Progress Updates: Pt presented in bed agreeable to therapy. Session focused on sitting balance and core activation. Pt performed rolling L/R with maxA to check brief for incontinence prior to sitting up. Pt then performed supine to sit with total A, pt was able to initiate movement of LLE to slide of of bed and attempted to help pushing to upright position with use of LUE. PTA assisted pt in attaining midline however required modA to maintain for more than 10 sec. Pt participated in reaching activities initially by picking up letters on tray table with emphasis on anterior lean as well as reaching with LUE to R then PTA assisting with returning to midline. Pt also participated in reaching tapping daughter's hand with LUE to L, anterior, and R with pt demonstrating improving return to midline with repetition. Pt also participated in mini "sit ups" from approx 15 degrees to full upright positioning to fatigue (5). Prior to pt returning to supine pt performed lateral scoot to [facility] with total A. Pt returned to supine total A and performed dependent scoot to [facility] x2. Pt repositioned to comfort with PTA placing pillow under R hip to offload. PTA also discussed and provided positioning sheet to daughter. Pt left with call bell within reach, bed alarm on, and needs met.       Therapy Documentation Precautions:  Precautions Precautions: Fall Precaution Comments:  corerack; R inattention Restrictions Weight Bearing Restrictions: No    Therapy/Group: Individual Therapy  Ashtan Laton  Janita Camberos, PTA  09/16/2019, 3:18 PM

## 2019-09-16 NOTE — Plan of Care (Signed)
  Problem: Consults Goal: RH STROKE PATIENT EDUCATION Description: See Patient Education module for education specifics  Outcome: Progressing Goal: Nutrition Consult-if indicated Outcome: Progressing Goal: Diabetes Guidelines if Diabetic/Glucose > 140 Description: If diabetic or lab glucose is > 140 mg/dl - Initiate Diabetes/Hyperglycemia Guidelines & Document Interventions  Outcome: Progressing   Problem: RH BOWEL ELIMINATION Goal: RH STG MANAGE BOWEL WITH ASSISTANCE Description: STG Manage Bowel with min Assistance. Outcome: Progressing Goal: RH STG MANAGE BOWEL W/MEDICATION W/ASSISTANCE Description: STG Manage Bowel with Medication with min Assistance. Outcome: Progressing   Problem: RH SKIN INTEGRITY Goal: RH STG SKIN FREE OF INFECTION/BREAKDOWN Description: With min assist  Outcome: Progressing Goal: RH STG MAINTAIN SKIN INTEGRITY WITH ASSISTANCE Description: STG Maintain Skin Integrity With  min Assistance. Outcome: Progressing Goal: RH STG ABLE TO PERFORM INCISION/WOUND CARE W/ASSISTANCE Description: STG Able To Perform Incision/Wound Care With min Assistance. Outcome: Progressing   Problem: RH SAFETY Goal: RH STG ADHERE TO SAFETY PRECAUTIONS W/ASSISTANCE/DEVICE Description: STG Adhere to Safety Precautions With min Assistance/Device. Outcome: Progressing   Problem: RH PAIN MANAGEMENT Goal: RH STG PAIN MANAGED AT OR BELOW PT'S PAIN GOAL Description: Patient will be free of pain Outcome: Progressing   Problem: RH KNOWLEDGE DEFICIT Goal: RH STG INCREASE KNOWLEDGE OF HYPERTENSION Description: Patient and or family will demonstrate understanding of measures to manage HTN with medications, dietary restrictions and exercise using handouts and educational resources with cues/reminders Outcome: Progressing Goal: RH STG INCREASE KNOWLEDGE OF DYSPHAGIA/FLUID INTAKE Description: Patient will be knowledgeable of dysphagia fluid types and intake Patient and or family will  demonstrate understanding of measures to manage dysphagia dietary restrictions using handouts and educational resources with cues/reminders Outcome: Progressing Goal: RH STG INCREASE KNOWLEGDE OF HYPERLIPIDEMIA Description: Pt will be knowledgeable of the risks of hyperlipidemia and the use of medication and diet to prevent disease and exercise using handouts and educational resources with cues/reminders Outcome: Progressing Goal: RH STG INCREASE KNOWLEDGE OF STROKE PROPHYLAXIS Description: Pt will be knowledgeable regarding prevention of stroke including ews medication and diet management, importance of follow up appointments and medical compliance with cues/reminder assistance Outcome: Progressing   Problem: RH Vision Goal: RH LTG Vision (Specify) Outcome: Progressing   Problem: RH BLADDER ELIMINATION Goal: RH STG MANAGE BLADDER WITH ASSISTANCE Description: STG Manage Bladder With min Assistance Outcome: Not Progressing Goal: RH STG MANAGE BLADDER WITH EQUIPMENT WITH ASSISTANCE Description: STG Manage Bladder With Equipment With total Assistance Outcome: Not Progressing

## 2019-09-16 NOTE — Progress Notes (Signed)
[place] PHYSICAL MEDICINE & REHABILITATION PROGRESS NOTE   Subjective/Complaints: Daughter asks about grounds pass, oral care. Very sleepy during the day  ROS: limited due to language/communication    Objective:   No results found. No results for input(s): WBC, HGB, HCT, PLT in the last 72 hours. No results for input(s): NA, K, CL, CO2, GLUCOSE, BUN, CREATININE, CALCIUM in the last 72 hours.  Intake/Output Summary (Last 24 hours) at 09/16/2019 1210 Last data filed at 09/16/2019 0755 Gross per 24 hour  Intake 60 ml  Output 2300 ml  Net -2240 ml     Physical Exam: Vital Signs Blood pressure (!) 157/94, pulse 72, temperature 97.7 F (36.5 C), temperature source Oral, resp. rate 18, height 5\' 5"  (1.651 m), weight 79.5 kg, SpO2 99 %. General: Alert and oriented x 3, No apparent distress HEENT: Head is normocephalic, atraumatic, PERRLA, EOMI, sclera anicteric, oral mucosa pink and moist, dentition intact, ext ear canals clear,  Neck: Supple without JVD or lymphadenopathy Heart: Reg rate and rhythm. No murmurs rubs or gallops Chest: CTA bilaterally without wheezes, rales, or rhonchi; no distress Abdomen: Soft, non-tender, non-distended, bowel sounds positive. Extremities: No clubbing, cyanosis, or edema. Pulses are 2+ Skin: No evidence of breakdown, no evidence of rash Neurologic: 4/5 in the left deltoid bicep tricep grip hip flexion knee extension ankle flexion Medical extremity without active movement right lower extremity has trace ankle plantarflexion  Musculoskeletal:No joint swelling    Assessment/Plan: 1. Functional deficits secondary to Left BG infarct  which require 3+ hours per day of interdisciplinary therapy in a comprehensive inpatient rehab setting.  Physiatrist is providing close team supervision and 24 hour management of active medical problems listed below.  Physiatrist and rehab team continue to assess barriers to discharge/monitor patient progress toward  functional and medical goals  Care Tool:  Bathing        Body parts bathed by helper: Right arm, Right lower leg, Left arm, Chest, Left lower leg, Face, Abdomen, Front perineal area, Buttocks, Right upper leg, Left upper leg     Bathing assist Assist Level: 2 Helpers     Upper Body Dressing/Undressing Upper body dressing   What is the patient wearing?: Pull over shirt    Upper body assist Assist Level: Maximal Assistance - Patient 25 - 49%    Lower Body Dressing/Undressing Lower body dressing      What is the patient wearing?: Pants     Lower body assist Assist for lower body dressing: 2 Helpers     Toileting Toileting    Toileting assist Assist for toileting: Dependent - Patient 0% (cath )     Transfers Chair/bed transfer  Transfers assist  Chair/bed transfer activity did not occur: Safety/medical concerns (would recommend lift for patient- needed skilled intervention to transfer)  Chair/bed transfer assist level: 2 Helpers     Locomotion Ambulation   Ambulation assist   Ambulation activity did not occur: Safety/medical concerns          Walk 10 feet activity   Assist  Walk 10 feet activity did not occur: Safety/medical concerns        Walk 50 feet activity   Assist Walk 50 feet with 2 turns activity did not occur: Safety/medical concerns         Walk 150 feet activity   Assist Walk 150 feet activity did not occur: Safety/medical concerns         Walk 10 feet on uneven surface  activity   Assist  Walk 10 feet on uneven surfaces activity did not occur: Safety/medical concerns         Wheelchair     Assist Will patient use wheelchair at discharge?: Yes      Wheelchair assist level: Dependent - Patient 0% Max wheelchair distance: 150    Wheelchair 50 feet with 2 turns activity    Assist        Assist Level: Dependent - Patient 0%   Wheelchair 150 feet activity     Assist      Assist Level:  Dependent - Patient 0%   Blood pressure (!) 157/94, pulse 72, temperature 97.7 F (36.5 C), temperature source Oral, resp. rate 18, height 5\' 5"  (1.651 m), weight 79.5 kg, SpO2 99 %.  Medical Problem List and Plan: 1.Right side weakness and aphasiasecondary to left MCA territory infarction status post IR revascularization with [facility] -patient may Shower if has good enough sitting balance -ELOS/Goals: 3-4 weeks- goals min A /mod A  -may go onto grounds with daughter.   2. Antithrombotics: -DVT/anticoagulation:SCDs- had petechial hemrrhages -antiplatelet therapy: Aspirin 81 mg daily 3. Pain Management:will schedule  Tylenol q6 hours through NGT 4. Mood:Zoloft 50 mg daily, Aricept, February 9 10 mg nightly-  -Methylphenidate 5 mg twice daily, may increase to 10 mg next week low yes of visit.  8/28: Daughter notes that she speaks more today than usual  -antipsychotic agents: N/A 5. Neuropsych: This patientisNOTcapable of making decisions on herown behalf. 6. Skin/Wound Care:Routine skin checks 7. Fluids/Electrolytes/Nutrition:Routine in and outs with follow-up chemistries, reduce TF to promote appetite If appetite not improved with level of alertness would trial megace  8. Dysphagia. Tube feeds as directed. Dietary follow-up. Speech therapy follow-up- has Cortrak- assess if things improves, or if needs PEG long term  -monitor intake as well as S/Sx of asp on po   -have asked RT for vest chest PT to help her mobilize secretions in chest in the short term.  9. Hypertension. Lopressor 50 mg twice daily, Cozaar 25 mg daily, Norvasc 10 mg daily.  Vitals:   09/15/19 2014 09/16/19 0518  BP: (!) 160/86 (!) 157/94  Pulse: 75 72  Resp: 18 18  Temp: 97.6 F (36.4 C) 97.7 F (36.5 C)  SpO2: 97% 99%   8/26 fair control  10. Hyperlipidemia. Lipitor 11. History of alcohol use. Provide counseling with patient and family 43.  Atrial fibrillation. No anticoagulation at this time.   Continue beta-blocker  HR controlled 8/28. 13. Urinary retention/proteus UTI- discussed with daughter this may cause a temporary setback in recovery   -keflex 250mg  TID started 8/21  -cx demonstrates >100K proteus S to keflex   -req I/O caths, volumes 350-450 cc overnight into this morning  -will try low dose urecholine to help with emptying- increased to 25mg  8/24  -purewick if needed to keep skin dry, but mostly retaining right now 14. Sleep-/insomnia- pt took 30, not 3 mg of Melatonin nightly at home AND 3+ pills of ZZZquil at home to sleep nightly per daughter- Juluis Rainier  8/28: daughter is concerned regarding sleep apnea. Would like outpatient sleep study.     LOS: 9 days A FACE TO FACE EVALUATION WAS PERFORMED  Martha Clan P Laddie Naeem 09/16/2019, 12:10 PM

## 2019-09-17 ENCOUNTER — Inpatient Hospital Stay (HOSPITAL_COMMUNITY): Payer: Medicare Other

## 2019-09-17 NOTE — Plan of Care (Signed)
  Problem: Consults Goal: RH STROKE PATIENT EDUCATION Description: See Patient Education module for education specifics  Outcome: Progressing Goal: Nutrition Consult-if indicated Outcome: Progressing Goal: Diabetes Guidelines if Diabetic/Glucose > 140 Description: If diabetic or lab glucose is > 140 mg/dl - Initiate Diabetes/Hyperglycemia Guidelines & Document Interventions  Outcome: Progressing   Problem: RH BOWEL ELIMINATION Goal: RH STG MANAGE BOWEL WITH ASSISTANCE Description: STG Manage Bowel with min Assistance. Outcome: Progressing Goal: RH STG MANAGE BOWEL W/MEDICATION W/ASSISTANCE Description: STG Manage Bowel with Medication with min Assistance. Outcome: Progressing   Problem: RH BLADDER ELIMINATION Goal: RH STG MANAGE BLADDER WITH EQUIPMENT WITH ASSISTANCE Description: STG Manage Bladder With Equipment With total Assistance Outcome: Progressing   Problem: RH SKIN INTEGRITY Goal: RH STG SKIN FREE OF INFECTION/BREAKDOWN Description: With min assist  Outcome: Progressing Goal: RH STG MAINTAIN SKIN INTEGRITY WITH ASSISTANCE Description: STG Maintain Skin Integrity With  min Assistance. Outcome: Progressing Goal: RH STG ABLE TO PERFORM INCISION/WOUND CARE W/ASSISTANCE Description: STG Able To Perform Incision/Wound Care With min Assistance. Outcome: Progressing   Problem: RH SAFETY Goal: RH STG ADHERE TO SAFETY PRECAUTIONS W/ASSISTANCE/DEVICE Description: STG Adhere to Safety Precautions With min Assistance/Device. Outcome: Progressing   Problem: RH PAIN MANAGEMENT Goal: RH STG PAIN MANAGED AT OR BELOW PT'S PAIN GOAL Description: Patient will be free of pain Outcome: Progressing   Problem: RH KNOWLEDGE DEFICIT Goal: RH STG INCREASE KNOWLEDGE OF HYPERTENSION Description: Patient and or family will demonstrate understanding of measures to manage HTN with medications, dietary restrictions and exercise using handouts and educational resources with  cues/reminders Outcome: Progressing Goal: RH STG INCREASE KNOWLEDGE OF DYSPHAGIA/FLUID INTAKE Description: Patient will be knowledgeable of dysphagia fluid types and intake Patient and or family will demonstrate understanding of measures to manage dysphagia dietary restrictions using handouts and educational resources with cues/reminders Outcome: Progressing Goal: RH STG INCREASE KNOWLEGDE OF HYPERLIPIDEMIA Description: Pt will be knowledgeable of the risks of hyperlipidemia and the use of medication and diet to prevent disease and exercise using handouts and educational resources with cues/reminders Outcome: Progressing Goal: RH STG INCREASE KNOWLEDGE OF STROKE PROPHYLAXIS Description: Pt will be knowledgeable regarding prevention of stroke including ews medication and diet management, importance of follow up appointments and medical compliance with cues/reminder assistance Outcome: Progressing   Problem: RH Vision Goal: RH LTG Vision (Specify) Outcome: Progressing   Problem: RH BLADDER ELIMINATION Goal: RH STG MANAGE BLADDER WITH ASSISTANCE Description: STG Manage Bladder With min Assistance Outcome: Not Progressing

## 2019-09-17 NOTE — Progress Notes (Signed)
Occupational Therapy Session Note  Patient Details  Name: [name] MRN: [id number] Date of Birth: 03-30-1932  Today's Date: 09/17/2019 OT Individual Time: 1400-1500 OT Individual Time Calculation (min): 60 min    Short Term Goals: Week 2:  OT Short Term Goal 1 (Week 2): Pt will sit EOB statically for ~5 min with supervison in prep for ADL task OT Short Term Goal 2 (Week 2): Pt will attend to familar ADL task demonstrating sustained attention for ~3 min with min cues OT Short Term Goal 3 (Week 2): Pt will don shirt with mod A in supported sitting at sink level OT Short Term Goal 4 (Week 2): Pt will roll in bed with mod A in prep for ADL tasks  Skilled Therapeutic Interventions/Progress Updates:    Pt received supine with daughter present, nodding yes to therapy session. Pt completed bed mobility to EOB with max A, heavy lift required to elevate trunk. Pt required min-mod A for sitting balance, with posterior and R lean. R UE was placed in weightbearing to mitigate R lean and was effective with total facilitation for full arm extension. Pt required multimodal cueing throughout session for following directions, and often benefited from demonstration. Pt completed UB bathing with mod A and UB dressing with mod A. Pt completed sit > stand in the stedy with max +2 assist. She had heavy R lean in standing and poor sequencing/direction following for maintaining midline orientation, or for LLE positioning- shooting her leg out under the stedy footplate. Visual feedback via mirror was not effective at correcting lean this session. Pt required max A for peri hygiene perched in stedy, as well as total A for clothing management. Pt was transferred to the TIS w/c via stedy. She was set up for oral care and required mod A for thoroughness. Pt initially able to mimic spitting out liquids but then was unable to motor plan a second time and required suction. Pt was set up to eat remainder of lunch and required  mod A for management of bite size and liquids via teaspoon. Pt was left sitting up with her daughter present to assist with the remainder of lunch.   Therapy Documentation Precautions:  Precautions Precautions: Fall Precaution Comments: corerack; R inattention Restrictions Weight Bearing Restrictions: No   Therapy/Group: Individual Therapy  Curtis Sites 09/17/2019, 7:19 AM

## 2019-09-17 NOTE — Progress Notes (Signed)
[place] PHYSICAL MEDICINE & REHABILITATION PROGRESS NOTE   Subjective/Complaints: Continues to be sleepy during day. Daughter asks about sleep chart. I messaged RN Caryl Pina to see if this was done and if she may go over it with daughter. Discussed megace for appetite stimulation but daughter defers given potential side effects  ROS: limited due to language/communication    Objective:   No results found. No results for input(s): WBC, HGB, HCT, PLT in the last 72 hours. No results for input(s): NA, K, CL, CO2, GLUCOSE, BUN, CREATININE, CALCIUM in the last 72 hours.  Intake/Output Summary (Last 24 hours) at 09/17/2019 1240 Last data filed at 09/17/2019 1018 Gross per 24 hour  Intake --  Output 1450 ml  Net -1450 ml     Physical Exam: Vital Signs Blood pressure 135/82, pulse 77, temperature 98.7 F (37.1 C), resp. rate 18, height 5\' 5"  (1.651 m), weight 79.6 kg, SpO2 100 %.  General: Somnolent, No apparent distress HEENT: Head is normocephalic, atraumatic, PERRLA, EOMI, sclera anicteric, oral mucosa pink and moist, dentition intact, ext ear canals clear,  Neck: Supple without JVD or lymphadenopathy Heart: Reg rate and rhythm. No murmurs rubs or gallops Chest: CTA bilaterally without wheezes, rales, or rhonchi; no distress Abdomen: Soft, non-tender, non-distended, bowel sounds positive. Extremities: No clubbing, cyanosis, or edema. Pulses are 2+ Skin: No evidence of breakdown, no evidence of rash Neurologic: 4/5 in the left deltoid bicep tricep grip hip flexion knee extension ankle flexion Medical extremity without active movement right lower extremity has trace ankle plantarflexion  Musculoskeletal:No joint swelling    Assessment/Plan: 1. Functional deficits secondary to Left BG infarct  which require 3+ hours per day of interdisciplinary therapy in a comprehensive inpatient rehab setting.  Physiatrist is providing close team supervision and 24 hour management of active  medical problems listed below.  Physiatrist and rehab team continue to assess barriers to discharge/monitor patient progress toward functional and medical goals  Care Tool:  Bathing        Body parts bathed by helper: Right arm, Right lower leg, Left arm, Chest, Left lower leg, Face, Abdomen, Front perineal area, Buttocks, Right upper leg, Left upper leg     Bathing assist Assist Level: 2 Helpers     Upper Body Dressing/Undressing Upper body dressing   What is the patient wearing?: Pull over shirt    Upper body assist Assist Level: Maximal Assistance - Patient 25 - 49%    Lower Body Dressing/Undressing Lower body dressing      What is the patient wearing?: Pants     Lower body assist Assist for lower body dressing: 2 Helpers     Toileting Toileting    Toileting assist Assist for toileting: Dependent - Patient 0% (cath )     Transfers Chair/bed transfer  Transfers assist  Chair/bed transfer activity did not occur: Safety/medical concerns (would recommend lift for patient- needed skilled intervention to transfer)  Chair/bed transfer assist level: 2 Helpers     Locomotion Ambulation   Ambulation assist   Ambulation activity did not occur: Safety/medical concerns          Walk 10 feet activity   Assist  Walk 10 feet activity did not occur: Safety/medical concerns        Walk 50 feet activity   Assist Walk 50 feet with 2 turns activity did not occur: Safety/medical concerns         Walk 150 feet activity   Assist Walk 150 feet activity did not  occur: Safety/medical concerns         Walk 10 feet on uneven surface  activity   Assist Walk 10 feet on uneven surfaces activity did not occur: Safety/medical concerns         Wheelchair     Assist Will patient use wheelchair at discharge?: Yes      Wheelchair assist level: Dependent - Patient 0% Max wheelchair distance: 150    Wheelchair 50 feet with 2 turns  activity    Assist        Assist Level: Dependent - Patient 0%   Wheelchair 150 feet activity     Assist      Assist Level: Dependent - Patient 0%   Blood pressure 135/82, pulse 77, temperature 98.7 F (37.1 C), resp. rate 18, height 5\' 5"  (1.651 m), weight 79.6 kg, SpO2 100 %.  Medical Problem List and Plan: 1.Right side weakness and aphasiasecondary to left MCA territory infarction status post IR revascularization with [facility] -patient may Shower if has good enough sitting balance -ELOS/Goals: 3-4 weeks- goals min A /mod A  -may go onto grounds with daughter if can tolerate sitting upright- currently quite fatigued.   Continue CIR 2. Antithrombotics: -DVT/anticoagulation:SCDs- had petechial hemrrhages -antiplatelet therapy: Aspirin 81 mg daily 3. Pain Management:will schedule  Tylenol q6 hours through NGT. Well controlled.  4. Mood:Zoloft 50 mg daily, Aricept, February 9 10 mg nightly-  -Methylphenidate 5 mg twice daily, may increase to 10 mg next week low yes of visit.  8/28: Daughter notes that she speaks more today than usual, but appears more fatigued 8/29. Asked Caryl Pina RN about sleep chart and to g over it with patient.  -antipsychotic agents: N/A 5. Neuropsych: This patientisNOTcapable of making decisions on herown behalf. 6. Skin/Wound Care:Routine skin checks 7. Fluids/Electrolytes/Nutrition:Routine in and outs with follow-up chemistries, reduce TF to promote appetite If appetite not improved with level of alertness would trial megace.             8/29: Discussed decreased appetite and megace with daughter. Discussed side effects of megace and she prefers we do not start at this time.   8. Dysphagia. Tube feeds as directed. Dietary follow-up. Speech therapy follow-up- has Cortrak- assess if things improves, or if needs PEG long term  -monitor intake as well as S/Sx of asp on po   -have asked RT for  vest chest PT to help her mobilize secretions in chest in the short term.  9. Hypertension. Lopressor 50 mg twice daily, Cozaar 25 mg daily, Norvasc 10 mg daily.  Vitals:   09/16/19 2009 09/17/19 0540  BP: (!) 156/63 135/82  Pulse: 74 77  Resp: 18 18  Temp: 98.2 F (36.8 C) 98.7 F (37.1 C)  SpO2: 99% 100%   8/26 fair control  10. Hyperlipidemia. Lipitor 11. History of alcohol use. Provide counseling with patient and family 37. Atrial fibrillation. No anticoagulation at this time.   Continue beta-blocker  HR controlled 8/28. 13. Urinary retention/proteus UTI- discussed with daughter this may cause a temporary setback in recovery   -keflex 250mg  TID started 8/21  -cx demonstrates >100K proteus S to keflex   -req I/O caths, volumes 350-450 cc overnight into this morning  -will try low dose urecholine to help with emptying- increased to 25mg  8/24  -purewick if needed to keep skin dry, but mostly retaining right now 14. Sleep-/insomnia- pt took 30, not 3 mg of Melatonin nightly at home AND 3+ pills of ZZZquil at home to sleep  nightly per daughter- Juluis Rainier  8/28: daughter is concerned regarding sleep apnea. Would like outpatient sleep study.     LOS: 10 days A FACE TO FACE EVALUATION WAS PERFORMED  Clide Deutscher Clary Meeker 09/17/2019, 12:40 PM

## 2019-09-18 ENCOUNTER — Inpatient Hospital Stay (HOSPITAL_COMMUNITY): Payer: Medicare Other

## 2019-09-18 ENCOUNTER — Inpatient Hospital Stay (HOSPITAL_COMMUNITY): Payer: Medicare Other | Admitting: Occupational Therapy

## 2019-09-18 ENCOUNTER — Inpatient Hospital Stay (HOSPITAL_COMMUNITY): Payer: Medicare Other | Admitting: Speech Pathology

## 2019-09-18 MED ORDER — METHYLPHENIDATE HCL 5 MG PO TABS
10.0000 mg | ORAL_TABLET | Freq: Two times a day (BID) | ORAL | Status: DC
  Administered 2019-09-18 – 2019-09-26 (×14): 10 mg via ORAL
  Filled 2019-09-18 (×14): qty 2

## 2019-09-18 MED ORDER — OSMOLITE 1.2 CAL PO LIQD
650.0000 mL | ORAL | Status: DC
  Administered 2019-09-18 – 2019-09-24 (×6): 650 mL
  Filled 2019-09-18 (×4): qty 1000

## 2019-09-18 MED ORDER — SENNA 8.6 MG PO TABS
1.0000 | ORAL_TABLET | Freq: Every day | ORAL | Status: DC
  Administered 2019-09-18 – 2019-09-26 (×8): 8.6 mg via ORAL
  Filled 2019-09-18 (×8): qty 1

## 2019-09-18 NOTE — Progress Notes (Signed)
Patient ID: [name], female   DOB: 1932/03/26, 86 y.o.   MRN: [id number]   [facility] has declined pt due to no bed availability.

## 2019-09-18 NOTE — Progress Notes (Signed)
Physical Therapy Session Note  Patient Details  Name: [name] MRN: [id number] Date of Birth: 1932-09-26  Today's Date: 09/18/2019 PT Individual Time: 1305-1350 PT Individual Time Calculation (min): 45 min   Short Term Goals: Week 2:  PT Short Term Goal 1 (Week 2): Pt will consistently transfer to and from [facility] with max assist of 1 PT Short Term Goal 2 (Week 2): Pt will initiate WC mobility training PT Short Term Goal 3 (Week 2): Pt will sit EOB with min assist from PT while engaged in functional task.  Skilled Therapeutic Interventions/Progress Updates: Pt presented in TIS with dgt present and pt awake. Pt nodding head in agreement to therapy. Per daughter pt attempted to eat some lunch but then began coughing and has been more lethargic since that. PTA transported pt to rehab gym and performed SB transfer to mat total A. Pt seated at [facility] with mirror feedback to work on midline orientation. Pt noted to require increased time to follow commands than last session with this particular therapist. Pt also required maxA for sitting balance with pt unable to initiate attempting to correct. Pt noted to become increasingly lethargic and unable to follow single step commands nor keep head up. Vitals checked and all WNL (121/69 MAP 84) SpO2 97%. PTA attempted to have pt participate in reaching activity however pt was unable to follow single command to take card from this therapist's hand. Pt ultimately transferred back to TIS dependent transfer x 2 and transported back to room. Pt required dependent x 2 transfer TIS to EOB and required maxA x 1 sit to supine. Pt then noted to be incontient of bowels with pt requiring maxA to total A rolling L/R to doff brief and total A for peri-care. Pt then noted to be sleeping after peri-care. PTA positioned pt with pillows and left with bed alarm on, call bell within reach and current needs met.      Therapy Documentation Precautions:  Precautions Precautions:  Fall Precaution Comments: corerack; R inattention Restrictions Weight Bearing Restrictions: No General: PT Amount of Missed Time (min): 30 Minutes PT Missed Treatment Reason: Patient fatigue Vital Signs: Therapy Vitals Temp: 98.3 F (36.8 C) Pulse Rate: (!) 57 Resp: 14 BP: 124/74 Patient Position (if appropriate): Sitting Oxygen Therapy SpO2: 96 % O2 Device: Room Air    Therapy/Group: Individual Therapy  Melek Pownall  Bailee Metter, PTA  09/18/2019, 3:52 PM

## 2019-09-18 NOTE — Progress Notes (Signed)
Occupational Therapy Session Note  Patient Details  Name: [name] MRN: [id number] Date of Birth: 12-28-1932  Today's Date: 09/18/2019 OT Individual Time: 0511-0211 OT Individual Time Calculation (min): 55 min    Short Term Goals: Week 2:  OT Short Term Goal 1 (Week 2): Pt will sit EOB statically for ~5 min with supervison in prep for ADL task OT Short Term Goal 2 (Week 2): Pt will attend to familar ADL task demonstrating sustained attention for ~3 min with min cues OT Short Term Goal 3 (Week 2): Pt will don shirt with mod A in supported sitting at sink level OT Short Term Goal 4 (Week 2): Pt will roll in bed with mod A in prep for ADL tasks  Skilled Therapeutic Interventions/Progress Updates:    patient in bed, daughter present for session.   She remained alert for session with occ cues and ongoing activity.  She does not indicate pain during session.   Rolling in bed to right with cues, to left mod A.  Donned pants at bed level dependent.  Side lying to sitting at edge of bed with max A.  CG/min A and cues to maintain unsupported sitting. SB transfer bed to w/c max A of one with mod A of 2nd.  She is able to wash face with cues, mod A for UB bathing, mod/max A for OH shirt.  Completed hair washing with shampoo tray - overall dependent with this task.  Completed seated posture activity with max A and cues for midline.  Right UE scapular facilitation - improved retraction with posture activity.  She remained seated in TIS w/c at close of session in semi reclined position with right UE supported on tray table.  Daughter remains with her to provide assistance as needed.    Therapy Documentation Precautions:  Precautions Precautions: Fall Precaution Comments: corerack; R inattention Restrictions Weight Bearing Restrictions: No   Therapy/Group: Individual Therapy  Carlos Levering 09/18/2019, 7:34 AM

## 2019-09-18 NOTE — Progress Notes (Addendum)
Speech Language Pathology Daily Session Note  Patient Details  Name: [name] MRN: [id number] Date of Birth: 06/23/1932  Today's Date: 09/18/2019 SLP Individual Time: 0730-0820 SLP Individual Time Calculation (min): 50 min  Short Term Goals: Week 2: SLP Short Term Goal 1 (Week 2): Pt will consume current diet with Mod A verbal/visual cues for implementation of compensatory swallow strategies for oral clearance and minimize aspiration risk. SLP Short Term Goal 2 (Week 2): Pt will demonstrate efficient mastication and oral clearance of Dys 2 solid trials a minimum of 2 times prior to diet advancement. SLP Short Term Goal 3 (Week 2): Pt will communicate basic wants and needs at the word level with Mod A verbal and/or visual cues. SLP Short Term Goal 4 (Week 2): Pt will name common objects with 80%  accuracy provided Mod A multimodal cues. SLP Short Term Goal 5 (Week 2): Pt will match words to common objects with 60% accuracy with Max A multimodal cues. SLP Short Term Goal 6 (Week 2): Pt will demonstrate awareness of functional and/or verbal errors with Max A multimodal cues.  Skilled Therapeutic Interventions: Pt was seen for skilled ST targeting dysphagia and communication goals. [name] present at bedside. [name] with questions regarding playing music and/or TV while eating; SLP provided education regarding pt's reduced attention and need for minimizing external distractions during tasks, even eating. Pt was fatigued this morning, but indicated appetite. She consumed <25% of her Dys 1 (puree) breakfast tray with noted weak lingual manipulation of solid boluses and generally poor bolus awareness (decline since last skilled observation). Min right buccal pocketing of purees also noted. Moderate verbal and visual cues provided for attention to bolus and clearance of buccal pocketing. Pt exhibited immediate cough X2 with tsp nectar thick juice. No overt s/sx aspiration noted when pt  self-fed nectar via tsp, however intake is still very limited at this time. SLP reviewed most recent vitals, with no noted temperature spikes and clear lung sounds. Recommend continue current diet.  Pt was minimally verbal, requiring Max encouragement to read at the word and phrase level and answer questions with verbal response. Mod A multimodal cues also provided for sustained attention to speech tasks. Pt required Max A sentence completion, phonemic, and intermittent written cues to name common objects and during phrase closure tasks. Mild perseveration on words noted, and pt appeared unaware of all verbal errors during today's session. Pt appeared to be in discomfort and when questioned about pain she pointed to her abdomen. RN made aware. Due to pain and fatigue pt missed last 10 minutes of skilled ST session. Pt left laying in bed with alarm set and needs within reach, daughter still present. Continue per current plan of care.          Pain Pain Assessment Pain Scale: Faces Faces Pain Scale: Hurts a little bit Pain Type: Acute pain Pain Location: Abdomen Pain Descriptors / Indicators: Grimacing;Discomfort Pain Onset: Gradual Pain Intervention(s): RN made aware Multiple Pain Sites: No  Therapy/Group: Individual Therapy  Arbutus Leas 09/18/2019, 8:31 AM

## 2019-09-18 NOTE — Progress Notes (Signed)
Patient ID: [name], female   DOB: 1933/01/18, 86 y.o.   MRN: [id number]  Sw met with pt daughter in room at bedside to discuss SNF options. Daughter [place]) provide SNF preferences to SW. Will begin sending patient referral to these facilities.  [facility], [facility] (both locations, [place]), [place], [place] ([place]), [place] ([place]), [facility] and [facility], [phone]

## 2019-09-18 NOTE — Progress Notes (Signed)
[place] PHYSICAL MEDICINE & REHABILITATION PROGRESS NOTE   Subjective/Complaints:  Daughter concerned about lack of BM, reviewed flowsheet , small daily BMs Discussed diet and intake and TF schedule, flowsheet indicates 25-30% meal intake   ROS: limited due to language/communication    Objective:   No results found. No results for input(s): WBC, HGB, HCT, PLT in the last 72 hours. No results for input(s): NA, K, CL, CO2, GLUCOSE, BUN, CREATININE, CALCIUM in the last 72 hours.  Intake/Output Summary (Last 24 hours) at 09/18/2019 1042 Last data filed at 09/18/2019 0900 Gross per 24 hour  Intake 230 ml  Output 1550 ml  Net -1320 ml     Physical Exam: Vital Signs Blood pressure (!) 147/80, pulse 81, temperature 98.9 F (37.2 C), resp. rate 19, height 5\' 5"  (1.651 m), weight 78.2 kg, SpO2 97 %.   General: No acute distress Mood and affect are appropriate Heart: Regular rate and rhythm no rubs murmurs or extra sounds Lungs: Clear to auscultation, breathing unlabored, no rales or wheezes Abdomen: Positive bowel sounds, soft nontender to palpation, nondistended Extremities: No clubbing, cyanosis, or edema Skin: No evidence of breakdown, no evidence of rash  Skin: No evidence of breakdown, no evidence of rash Neurologic: 4/5 in the left deltoid bicep tricep grip hip flexion knee extension ankle flexion Medical extremity without active movement right lower extremity has trace ankle plantarflexion  Musculoskeletal:No joint swelling    Assessment/Plan: 1. Functional deficits secondary to Left BG /MCA infarct  which require 3+ hours per day of interdisciplinary therapy in a comprehensive inpatient rehab setting.  Physiatrist is providing close team supervision and 24 hour management of active medical problems listed below.  Physiatrist and rehab team continue to assess barriers to discharge/monitor patient progress toward functional and medical goals  Care  Tool:  Bathing        Body parts bathed by helper: Right arm, Right lower leg, Left arm, Chest, Left lower leg, Face, Abdomen, Front perineal area, Buttocks, Right upper leg, Left upper leg     Bathing assist Assist Level: 2 Helpers     Upper Body Dressing/Undressing Upper body dressing   What is the patient wearing?: Pull over shirt    Upper body assist Assist Level: Maximal Assistance - Patient 25 - 49%    Lower Body Dressing/Undressing Lower body dressing      What is the patient wearing?: Pants     Lower body assist Assist for lower body dressing: 2 Helpers     Toileting Toileting    Toileting assist Assist for toileting: Dependent - Patient 0% (cath )     Transfers Chair/bed transfer  Transfers assist  Chair/bed transfer activity did not occur: Safety/medical concerns (would recommend lift for patient- needed skilled intervention to transfer)  Chair/bed transfer assist level: 2 Helpers     Locomotion Ambulation   Ambulation assist   Ambulation activity did not occur: Safety/medical concerns          Walk 10 feet activity   Assist  Walk 10 feet activity did not occur: Safety/medical concerns        Walk 50 feet activity   Assist Walk 50 feet with 2 turns activity did not occur: Safety/medical concerns         Walk 150 feet activity   Assist Walk 150 feet activity did not occur: Safety/medical concerns         Walk 10 feet on uneven surface  activity   Assist Walk 10 feet  on uneven surfaces activity did not occur: Safety/medical concerns         Wheelchair     Assist Will patient use wheelchair at discharge?: Yes      Wheelchair assist level: Dependent - Patient 0% Max wheelchair distance: 150    Wheelchair 50 feet with 2 turns activity    Assist        Assist Level: Dependent - Patient 0%   Wheelchair 150 feet activity     Assist      Assist Level: Dependent - Patient 0%   Blood pressure  (!) 147/80, pulse 81, temperature 98.9 F (37.2 C), resp. rate 19, height 5\' 5"  (1.651 m), weight 78.2 kg, SpO2 97 %.  Medical Problem List and Plan: 1.Right side weakness and aphasiasecondary to left MCA territory infarction status post IR revascularization with [facility] -patient may Shower if has good enough sitting balance -ELOS/Goals: 3-4 weeks- goals min A /mod A  -may go onto grounds with daughter if can tolerate sitting upright- currently quite fatigued.   Continue CIR 2. Antithrombotics: -DVT/anticoagulation:SCDs- had petechial hemrrhages -antiplatelet therapy: Aspirin 81 mg daily 3. Pain Management:will schedule  Tylenol q6 hours through NGT. Well controlled.  4. Mood:Zoloft 50 mg daily, Aricept, February 9 10 mg nightly-  -Methylphenidate increase to 10 mg BID-antipsychotic agents: N/A 5. Neuropsych: This patientisNOTcapable of making decisions on herown behalf. 6. Skin/Wound Care:Routine skin checks 7. Fluids/Electrolytes/Nutrition:Routine in and outs with follow-up chemistries, Change TF times 8. Dysphagia. Tube feeds as directed. Dietary follow-up. Speech therapy follow-up- has Cortrak- assess if things improves, or if needs PEG long term  -monitor intake as well as S/Sx of asp on po   -have asked RT for vest chest PT to help her mobilize secretions in chest in the short term.  9. Hypertension. Lopressor 50 mg twice daily, Cozaar 25 mg daily, Norvasc 10 mg daily.  Vitals:   09/17/19 1954 09/18/19 0533  BP: (!) 162/98 (!) 147/80  Pulse: 85 81  Resp: 20 19  Temp: 98 F (36.7 C) 98.9 F (37.2 C)  SpO2: 98% 97%   8/26 fair control  10. Hyperlipidemia. Lipitor 11. History of alcohol use. Provide counseling with patient and family 51. Atrial fibrillation. No anticoagulation at this time.   Continue beta-blocker  HR controlled 8/28. 13. Urinary retention/proteus UTI- discussed with daughter this may cause  a temporary setback in recovery   -keflex 250mg  TID started 8/21  -cx demonstrates >100K proteus S to keflex   -req I/O caths, volumes 350-450 cc overnight into this morning  -will try low dose urecholine to help with emptying- increased to 25mg  8/24  -purewick if needed to keep skin dry, but mostly retaining right now 14. Sleep-/insomnia- pt took 30, not 3 mg of Melatonin nightly at home AND 3+ pills of ZZZquil at home to sleep nightly per daughter- Juluis Rainier  8/28: daughter is concerned regarding sleep apnea. Would like outpatient sleep study.   Sleep  Graph indicates 7-8hr sleep 15.  Constipation small daily BM will check KUB, schedule senokot, (not senna S due to mushy stools)  LOS: 11 days A FACE TO FACE EVALUATION WAS PERFORMED  Charlett Blake 09/18/2019, 10:42 AM

## 2019-09-18 NOTE — Progress Notes (Signed)
RT spoke with pts daughter who stated that pt wasn't done with her breakfast yet and requested for CPT to be held at this time. RT will continue to monitor pt status.

## 2019-09-19 ENCOUNTER — Inpatient Hospital Stay (HOSPITAL_COMMUNITY): Payer: Medicare Other | Admitting: Occupational Therapy

## 2019-09-19 ENCOUNTER — Inpatient Hospital Stay (HOSPITAL_COMMUNITY): Payer: Medicare Other | Admitting: Physical Therapy

## 2019-09-19 ENCOUNTER — Inpatient Hospital Stay (HOSPITAL_COMMUNITY): Payer: Medicare Other

## 2019-09-19 ENCOUNTER — Inpatient Hospital Stay (HOSPITAL_COMMUNITY): Payer: Medicare Other | Admitting: Speech Pathology

## 2019-09-19 LAB — CBC WITH DIFFERENTIAL/PLATELET
Abs Immature Granulocytes: 0.08 10*3/uL — ABNORMAL HIGH (ref 0.00–0.07)
Basophils Absolute: 0.1 10*3/uL (ref 0.0–0.1)
Basophils Relative: 1 %
Eosinophils Absolute: 0.1 10*3/uL (ref 0.0–0.5)
Eosinophils Relative: 1 %
HCT: 42.2 % (ref 36.0–46.0)
Hemoglobin: 13.8 g/dL (ref 12.0–15.0)
Immature Granulocytes: 1 %
Lymphocytes Relative: 18 %
Lymphs Abs: 1.4 10*3/uL (ref 0.7–4.0)
MCH: 32 pg (ref 26.0–34.0)
MCHC: 32.7 g/dL (ref 30.0–36.0)
MCV: 97.9 fL (ref 80.0–100.0)
Monocytes Absolute: 0.5 10*3/uL (ref 0.1–1.0)
Monocytes Relative: 6 %
Neutro Abs: 5.9 10*3/uL (ref 1.7–7.7)
Neutrophils Relative %: 73 %
Platelets: 406 10*3/uL — ABNORMAL HIGH (ref 150–400)
RBC: 4.31 MIL/uL (ref 3.87–5.11)
RDW: 12.4 % (ref 11.5–15.5)
WBC: 8 10*3/uL (ref 4.0–10.5)
nRBC: 0 % (ref 0.0–0.2)

## 2019-09-19 MED ORDER — ALUM & MAG HYDROXIDE-SIMETH 200-200-20 MG/5ML PO SUSP: 15.0000 mL | ORAL | Status: DC | PRN

## 2019-09-19 NOTE — Progress Notes (Signed)
Occupational Therapy Session Note  Patient Details  Name: [name] MRN: [id number] Date of Birth: [birth year]  Today's Date: 09/19/2019 OT Individual Time: 1561-5379 OT Individual Time Calculation (min): 53 min    Short Term Goals: Week 2:  OT Short Term Goal 1 (Week 2): Pt will sit EOB statically for ~5 min with supervison in prep for ADL task OT Short Term Goal 2 (Week 2): Pt will attend to familar ADL task demonstrating sustained attention for ~3 min with min cues OT Short Term Goal 3 (Week 2): Pt will don shirt with mod A in supported sitting at sink level OT Short Term Goal 4 (Week 2): Pt will roll in bed with mod A in prep for ADL tasks  Skilled Therapeutic Interventions/Progress Updates:  Patient met lying supine in bed during hygiene/clothing management task with NT at bedside. Assisted NT with rolling patient R<>L with Max A. LB clothing donned with total A. Patient very lethargic and following 1-step verbal commands with <50% accuracy throughout tx session. Patient also requiring maximal mulitmodal cues to keep eyes open and for attention to task. Supine to EOB and EOB <>TIS wc with Total A +2. Seated at sink level, patient required Total A and maximal multimodal cues to doff/don UB clothing. Total A for wc transport to dayroom. Patient engaged in static sitting balance in TIS wc positioned in neutral. Patient with heavy LOB to R this date requiring Mod A for orientation to midline with mirror for visual feedback. Session concluded with patient lying supine in bed with NT present for I&O cath.   Therapy Documentation Precautions:  Precautions Precautions: Fall Precaution Comments: corerack; R inattention Restrictions Weight Bearing Restrictions: No General:    Therapy/Group: Individual Therapy  [name] 09/19/2019, 7:19 AM

## 2019-09-19 NOTE — Progress Notes (Signed)
Physical Therapy Session Note  Patient Details  Name: [name] MRN: [id number] Date of Birth: 29-Dec-1932  Today's Date: 09/19/2019 PT Individual Time: 1335-1450 PT Individual Time Calculation (min): 75 min   Short Term Goals: Week 2:  PT Short Term Goal 1 (Week 2): Pt will consistently transfer to and from [facility] with max assist of 1 PT Short Term Goal 2 (Week 2): Pt will initiate WC mobility training PT Short Term Goal 3 (Week 2): Pt will sit EOB with min assist from PT while engaged in functional task.  Skilled Therapeutic Interventions/Progress Updates: Pt presented in bed with dgt present agreeable to therapy. Pt noted to appear more awake this session as compared to previous day's session. Pt performed supine to sit with maxA however was able to maintain sitting balance with CGA this session. Pt performed SB transfer to R with maxA x 2. Pt transported to rehab gym and performed SB transfer to R with maxA x 1 and increased initiation from pt. Pt participated in reaching activities with LUE reaching for target on L to decrease pushing and elongating of R trunk. Pt was able to perform consistently and return to near neutral with minimal cues this session. Pt then participated in ball taps x20 with LUE in unsupported sitting with pt able to perform overall with CGA. Pt then participated in STS "three musketeers" style with PTA facilitating wt shifting to R for increased wt bearing through RLE. PTA then attempted to have pt perform STS from [facility]. Pt required +2 for stand but increased pushing noted through LLE causing strong R lean thus pt returned to sitting requiring total A x 2. Performed SB transfer to L back to TIS maxA x 1. After supported seated rest pt was able to perform STS x 2 at wall rail with modA however was unable to initiate step with LLE. Pt then noted to possibly be incontinent of bowel. Pt thus transferred back to room and performed SB transfer to bed maxA x 2 to left. Pt returned  to supine maxA x 1 and noted to be incontinent of bowel. Pt performed rolling to R maxA for peri-care then handed off to NT to complete pt care.      Therapy Documentation Precautions:  Precautions Precautions: Fall Precaution Comments: corerack; R inattention Restrictions Weight Bearing Restrictions: No General:   Vital Signs: Therapy Vitals Temp: 98.2 F (36.8 C) Pulse Rate: 63 Resp: 18 BP: 129/70 Oxygen Therapy SpO2: 97 %   Therapy/Group: Individual Therapy  Chibuikem Thang  Herb Beltre, PTA  09/19/2019, 3:57 PM

## 2019-09-19 NOTE — Progress Notes (Addendum)
Patient ID: [name], female   DOB: [birth year], 86 y.o.   MRN: [id number]  Left voicemail for AD at [facility] for return call on patient's referral. Will provide update once AD returns call.  *Left voicemail on AD cell phone as of 8/31 *Sw informed facility is able to make bed offer as on 9/1. Will attempt to set up transfer for next week. 9/7  Followed up with [facility] Car[facility], AD reports facility has not made a decision on patient yet.  *Sw updated facility will not be able to take patient to bed availability and DON concerns.   Left voicemail with [facility] receptionist, due to AD preferring not to take voicemails currently.  *Patient has received a bed offer and facility is able to take patient on 9/7.  Left voicemail with [facility] for referral follow up. *Facility unable to accept patient due to no bed availability for a while, due to pending patients.   [place], [place]

## 2019-09-19 NOTE — Progress Notes (Signed)
Speech Language Pathology Daily Session Note  Patient Details  Name: [name] MRN: [id number] Date of Birth: 1932/07/30  Today's Date: 09/19/2019 SLP Individual Time: 2563-8937 SLP Individual Time Calculation (min): 53 min  Short Term Goals: Week 2: SLP Short Term Goal 1 (Week 2): Pt will consume current diet with Mod A verbal/visual cues for implementation of compensatory swallow strategies for oral clearance and minimize aspiration risk. SLP Short Term Goal 2 (Week 2): Pt will demonstrate efficient mastication and oral clearance of Dys 2 solid trials a minimum of 2 times prior to diet advancement. SLP Short Term Goal 3 (Week 2): Pt will communicate basic wants and needs at the word level with Mod A verbal and/or visual cues. SLP Short Term Goal 4 (Week 2): Pt will name common objects with 80%  accuracy provided Mod A multimodal cues. SLP Short Term Goal 5 (Week 2): Pt will match words to common objects with 60% accuracy with Max A multimodal cues. SLP Short Term Goal 6 (Week 2): Pt will demonstrate awareness of functional and/or verbal errors with Max A multimodal cues.  Skilled Therapeutic Interventions: Skilled treatment session focused on dysphagia and cognitive-linguistic goals. Upon arrival, patient alert and agreeable to a snack of Dys. 1 textures and nectar-thick liquids (pudding and thickened keto coffee).  Patient initially required Min A verbal and visual cues for initiation of self-feeding. Patient self-fed ~4 bites and did not demonstrate any overt s/s of aspiration. However, patient abruptly put down her spoon and declined any further intake of the Dys. 1 textures or nectar-thick liquids. Patient appeared uncomfortable but unable to communicate why despite Max A for use of multimodal cues. It appeared patient had stomach discomfort, PA made aware and ordered PRN medication. Patient appeared lethargic and demonstrated difficulty holding up her head at times with minimal attempts  to communicate via words or gestures. Educated patient's daughter for potential to change therapy schedule to 15/7 to maximize participation. She verbalized understanding and agreement. SLP also educated daughter on importance of trying to maximize PO intake to facilitate removal of NG tube. SLP suggested several smaller meals or snacks throughout the day when awake and alert since both daughters are signed off to provide supervision with meals. Also educated on appropriate textures to maximize variety of foods like thickened soups, hummus, oatmeal, etc. She verbalized understanding of all education. Patient left upright in bed with alarm on and all needs within reach. Continue with current plan of care.      Pain Unable to show or report pain but patient with intermittent grimacing and appeared to be in stomach discomfort. PA made aware and ordered medication.   Therapy/Group: Individual Therapy  [name] 09/19/2019, 11:51 AM

## 2019-09-19 NOTE — Progress Notes (Signed)
[facility] PHYSICAL MEDICINE & REHABILITATION PROGRESS NOTE   Subjective/Complaints: Patient reported to be more lethargic than yesterday a.m.  The patient received her methylphenidate at 952 this a.m., received methylphenidate 841 yesterday a.m. Oral intake discussed with daughter, taking about 25%. Had a coughing spell with OT this morning, also daughter noted coughing this morning. Patient not coughing at current time.  ROS: limited due to language/communication    Objective:   DG Abd 1 View  Result Date: 09/18/2019 CLINICAL DATA:  Evaluate feeding tube. EXAM: ABDOMEN - 1 VIEW COMPARISON:  09/03/2019 and prior studies FINDINGS: A small bore feeding tube is noted with tip at the pylorus. No dilated bowel loops are noted. Colonic diverticulosis identified. Prior surgical fusion changes again noted. IMPRESSION: Small bore feeding tube with tip at the pylorus. Electronically Signed   By: Margarette Canada M.D.   On: 09/18/2019 12:28   No results for input(s): WBC, HGB, HCT, PLT in the last 72 hours. No results for input(s): NA, K, CL, CO2, GLUCOSE, BUN, CREATININE, CALCIUM in the last 72 hours.  Intake/Output Summary (Last 24 hours) at 09/19/2019 0844 Last data filed at 09/19/2019 0128 Gross per 24 hour  Intake 160 ml  Output 1400 ml  Net -1240 ml     Physical Exam: Vital Signs Blood pressure 133/76, pulse 72, temperature 98.1 F (36.7 C), resp. rate 19, height 5\' 5"  (1.651 m), weight 79.1 kg, SpO2 96 %.    General: No acute distress Mood and affect are appropriate Heart: Regular rate and rhythm no rubs murmurs or extra sounds Lungs: Clear to auscultation, breathing unlabored, no rales or wheezes Abdomen: Positive bowel sounds, soft nontender to palpation, nondistended Extremities: No clubbing, cyanosis, or edema  Skin: Ecchymosis on forearm, right, no evidence of rash Neurologic: 4/5 in the left deltoid bicep tricep grip hip flexion knee extension ankle flexion Medical extremity  without active movement right lower extremity has trace ankle plantarflexion  Musculoskeletal:No joint swelling    Assessment/Plan: 1. Functional deficits secondary to Left BG /MCA infarct  which require 3+ hours per day of interdisciplinary therapy in a comprehensive inpatient rehab setting.  Physiatrist is providing close team supervision and 24 hour management of active medical problems listed below.  Physiatrist and rehab team continue to assess barriers to discharge/monitor patient progress toward functional and medical goals  Care Tool:  Bathing        Body parts bathed by helper: Right arm, Right lower leg, Left arm, Chest, Left lower leg, Face, Abdomen, Front perineal area, Buttocks, Right upper leg, Left upper leg     Bathing assist Assist Level: 2 Helpers     Upper Body Dressing/Undressing Upper body dressing   What is the patient wearing?: Pull over shirt    Upper body assist Assist Level: Maximal Assistance - Patient 25 - 49%    Lower Body Dressing/Undressing Lower body dressing      What is the patient wearing?: Pants     Lower body assist Assist for lower body dressing: 2 Helpers     Toileting Toileting    Toileting assist Assist for toileting: Dependent - Patient 0% (cath )     Transfers Chair/bed transfer  Transfers assist  Chair/bed transfer activity did not occur: Safety/medical concerns (would recommend lift for patient- needed skilled intervention to transfer)  Chair/bed transfer assist level: 2 Helpers     Locomotion Ambulation   Ambulation assist   Ambulation activity did not occur: Safety/medical concerns  Walk 10 feet activity   Assist  Walk 10 feet activity did not occur: Safety/medical concerns        Walk 50 feet activity   Assist Walk 50 feet with 2 turns activity did not occur: Safety/medical concerns         Walk 150 feet activity   Assist Walk 150 feet activity did not occur: Safety/medical  concerns         Walk 10 feet on uneven surface  activity   Assist Walk 10 feet on uneven surfaces activity did not occur: Safety/medical concerns         Wheelchair     Assist Will patient use wheelchair at discharge?: Yes      Wheelchair assist level: Dependent - Patient 0% Max wheelchair distance: 150    Wheelchair 50 feet with 2 turns activity    Assist        Assist Level: Dependent - Patient 0%   Wheelchair 150 feet activity     Assist      Assist Level: Dependent - Patient 0%   Blood pressure 133/76, pulse 72, temperature 98.1 F (36.7 C), resp. rate 19, height 5\' 5"  (1.651 m), weight 79.1 kg, SpO2 96 %.  Medical Problem List and Plan: 1.Right side weakness and aphasiasecondary to left MCA territory infarction status post IR revascularization with SAH -patient may Shower if has good enough sitting balance -ELOS/Goals: 3-4 weeks- goals min A /mod A  -Patient noted to have more lethargy, did not receive methylphenidate prior to therapy which may be a factor.  Patient is at risk for aspiration pneumonia given swallowing difficulties and overall reduced level of alertness.  Fluctuation associated with stroke is not uncommon.  Continue CIR 2. Antithrombotics: -DVT/anticoagulation:SCDs- had petechial hemrrhages -antiplatelet therapy: Aspirin 81 mg daily 3. Pain Management:will schedule  Tylenol q6 hours through NGT. Well controlled.  4. Mood:Zoloft 50 mg daily, Aricept, February 9 10 mg nightly-  -Methylphenidate increase to 10 mg BID-antipsychotic agents: N/A 5. Neuropsych: This patientisNOTcapable of making decisions on herown behalf. 6. Skin/Wound Care:Routine skin checks 7. Fluids/Electrolytes/Nutrition:Routine in and outs with follow-up chemistries, Change TF times 8. Dysphagia. Tube feeds as directed. Dietary follow-up. Speech therapy follow-up- has Cortrak- assess if things  improves, or if needs PEG long term  -monitor intake as well as S/Sx of asp on po   -We will check chest x-ray, check CBC Will discussed MB S versus PEG with SLP in a.m. 9. Hypertension. Lopressor 50 mg twice daily, Cozaar 25 mg daily, Norvasc 10 mg daily.  Vitals:   09/18/19 1939 09/19/19 0545  BP: (!) 169/89 133/76  Pulse: 89 72  Resp: 19 19  Temp: 98.3 F (36.8 C) 98.1 F (36.7 C)  SpO2: 100% 96%   Good control 8/31 10. Hyperlipidemia. Lipitor 11. History of alcohol use. Provide counseling with patient and family 34. Atrial fibrillation. No anticoagulation at this time.   Continue beta-blocker  HR controlled 8/28. 13. Urinary retention/proteus UTI- discussed with daughter this may cause a temporary setback in recovery   -keflex 250mg  TID started 8/21  -Keflex just discontinued yesterday, 10-day course, afebrile  -req I/O caths, volumes 350-450 cc overnight into this morning  -will try low dose urecholine to help with emptying- increased to 25mg  8/24  -purewick if needed to keep skin dry, but mostly retaining right now 14. Sleep-/insomnia- pt took 30, not 3 mg of Melatonin nightly at home AND 3+ pills of ZZZquil at home to sleep nightly per daughter-  FYI  8/28: daughter is concerned regarding sleep apnea. Would like outpatient sleep study.   Sleep  Graph  15.  Constipation small daily BM will check KUB, schedule senokot, (not senna S due to mushy stools), may cause some GI discomfort, Already on Protonix given history of alcohol abuse with potential for gastritis LOS: 12 days A FACE TO [place] [facility] Jovi Alvizo 09/19/2019, 8:44 AM

## 2019-09-19 NOTE — Progress Notes (Signed)
Nutrition Follow-up  RD working remotely.  DOCUMENTATION CODES:   Not applicable  INTERVENTION:   Continue nocturnal tube feeding via Cortrak: - Osmolite 1.2 @ 65 ml/hr to run for 10 hours from 1900 to 0500 - ProSource TF 45 ml TID - Free waterflushes of 200 ml q 6 hours  Nocturnal tube feeding regimenand free water provides900kcal, 69grams of protein, and 1374ml of H2O (meets 55% of kcal and 81% of protein needs).  - Magic Cup TID with meals, each supplement provides 290 kcal and 9 grams of protein  NUTRITION DIAGNOSIS:   Inadequate oral intake related to dysphagia, lethargy/confusion as evidenced by NPO status.  Progressing, pt now on dysphagia 1 diet with nectar-thick liquids  GOAL:   Patient will meet greater than or equal to 90% of their needs  Progressing, being addressed via TF and supplements  MONITOR:   PO intake, Supplement acceptance, Diet advancement, Weight trends, Labs, TF tolerance  REASON FOR ASSESSMENT:   Consult Enteral/tube feeding initiation and management  ASSESSMENT:   86 year old female with PMH of atrial fibrillation, HTN, HLD, CAD, mild cognitive impairment of vascular etiology complicated by EtOH use. Presented 09/02/19 with right-sided weakness and aphasia. 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 Near occlusive thrombus within the proximal left M1 MCA extending into M2 branch. Pt underwent complete revascularization per IR and was intubated for a short time. Follow-up head CT 09/06/19 after revascularization procedure showed petechial hemorrhage at the basal ganglia and small subarachnoid hemorrhage along the sylvian fissure. Scan completed again on 09/06/19 showing no evidence of new hemorrhage. Currently NPO with Cortrak and tube feeds. Admitted to CIR at 09/07/19.  8/23 - MBS, diet advanced to dysphagia 1 with nectar-thick liquids 8/24 - transitioned to nocturnal TF   Noted target d/c date of 09/20/19. Plan is for  pt to d/c to SNF.  Noted MD considering repeat MBS vs PEG. Plan to continue nocturnal TF via Cortrak at this time given inconsistent PO intake.  Pt's weight is stable since admit to CIR.  Attempted to reach pt's family via phone call to room; however, no answer.  Current TF: Osmolite 1.2 @ 65 ml/hr to run for 10 hours from 1900 to 0500, ProSource TF 45 ml TID, free water flushes of 200 ml q 6 hours  Meal Completion: 15-50% x last 8 recorded meals (averaging 33%)  Medications reviewed and include: ritalin, liquid MVI, senna, vitamin B-12  Labs reviewed: sodium 131 on 8/25  UOP: 1400 ml x 24 hours  Diet Order:   Diet Order            DIET - DYS 1 Room service appropriate? Yes; Fluid consistency: Nectar Thick  Diet effective now                 EDUCATION NEEDS:   Education needs have been addressed  Skin:  Skin Assessment: Skin Integrity Issues: Incisions: right groin  Last BM:  09/19/19 large type 7  Height:   Ht Readings from Last 1 Encounters:  09/08/19 5\' 5"  (1.651 m)    Weight:   Wt Readings from Last 1 Encounters:  09/19/19 79.1 kg    Ideal Body Weight:  56.8 kg  BMI:  Body mass index is 29.02 kg/m.  Estimated Nutritional Needs:   Kcal:  1650-1850  Protein:  85-100 grams  Fluid:  1.6-1.8 L    Gaynell Face, MS, RD, LDN Inpatient Clinical Dietitian Please see [facility] for contact information.

## 2019-09-19 NOTE — Progress Notes (Signed)
Patient ID: [name], female   DOB: 1932-11-02, 86 y.o.   MRN: [id number]   SW will begin sending patients referral out to other facilities in [place] and [facility]

## 2019-09-19 NOTE — Progress Notes (Signed)
Patient ID: [name], female   DOB: [birth year], 86 y.o.   MRN: [id number]   Sw informed daughter of bed offer at [facility]. AD will be able to take patient on Tuesday (9/7). Facility prefers early transfer, will set transport for 10AM.

## 2019-09-19 NOTE — Plan of Care (Signed)
Pt's plan of care adjusted to 15/7 after speaking with care team and discussed with MD in team conference as pt currently unable to tolerate current therapy schedule with OT, PT, and SLP.   Weston Anna, [place], [place]

## 2019-09-20 ENCOUNTER — Inpatient Hospital Stay (HOSPITAL_COMMUNITY): Payer: Medicare Other | Admitting: Speech Pathology

## 2019-09-20 ENCOUNTER — Inpatient Hospital Stay (HOSPITAL_COMMUNITY): Payer: Medicare Other | Admitting: Occupational Therapy

## 2019-09-20 ENCOUNTER — Inpatient Hospital Stay (HOSPITAL_COMMUNITY): Payer: Medicare Other | Admitting: Physical Therapy

## 2019-09-20 ENCOUNTER — Inpatient Hospital Stay (HOSPITAL_COMMUNITY): Payer: Medicare Other

## 2019-09-20 LAB — SARS CORONAVIRUS 2 (TAT 6-24 HRS): SARS Coronavirus 2: NEGATIVE

## 2019-09-20 NOTE — Progress Notes (Signed)
Patient ID: [name], female   DOB: 01-12-1933, 86 y.o.   MRN: [id number]   Patient updates sent to [place].

## 2019-09-20 NOTE — Progress Notes (Signed)
Physical Therapy Session Note  Patient Details  Name: [name] MRN: [id number] Date of Birth: 10-14-1932  Today's Date: 09/20/2019 PT Individual Time: 1349-1415 PT Individual Time Calculation (min): 26 min   Short Term Goals: Week 2:  PT Short Term Goal 1 (Week 2): Pt will consistently transfer to and from [facility] with max assist of 1 PT Short Term Goal 2 (Week 2): Pt will initiate WC mobility training PT Short Term Goal 3 (Week 2): Pt will sit EOB with min assist from PT while engaged in functional task.  Skilled Therapeutic Interventions/Progress Updates: Pt presented in bed with dgt and NT present (performing bladder scan). Pt alert and agreeable to therapy. Performed supine to sit at EOB maxA x 1 with pt demonstrating good initiation this session. Throughout session sitting balance CGA to minA with pt able to correct R lateral lean with verbal cues. PTA performed PROM knee flexion/extension with pt able to maintain static sitting balance. Pt then performed LAQ with LLE with minimal posterior lean. Pt participated in x 2 STS with HW requiring maxA and PTA blocking R knee. In standing PTA facilitated lateral wt shifting to R to increase load on RLE. Pt then performed lateral scoots x 3 to [facility] with maxA. Performed sit to supine with max (nearing modA) with pt demonstrating fair truncal control to bed using LUE and PTA assisting RLE onto bed onlyl. Pt rolled to R with modA as pt suspect and confirmed bowel incontinence. Pt returned to supine and left with dgt present and nsg notified of pt's disposition.      Therapy Documentation Precautions:  Precautions Precautions: Fall Precaution Comments: corerack; R inattention Restrictions Weight Bearing Restrictions: No General:   Vital Signs: Therapy Vitals Temp: (!) 97.4 F (36.3 C) Temp Source: Oral Pulse Rate: 76 Resp: 20 BP: (!) 147/86 Patient Position (if appropriate): Lying Oxygen Therapy SpO2: 99 % O2 Device: Room  Air    Therapy/Group: Individual Therapy  Glenola Wheat 09/20/2019, 4:17 PM

## 2019-09-20 NOTE — Progress Notes (Signed)
Physical Therapy Session Note  Patient Details  Name: [name] MRN: [id number] Date of Birth: 09-05-1932  Today's Date: 09/20/2019 PT Individual Time: 6203-5597 PT Individual Time Calculation (min): 30 min   Short Term Goals: Week 2:  PT Short Term Goal 1 (Week 2): Pt will consistently transfer to and from [facility] with max assist of 1 PT Short Term Goal 2 (Week 2): Pt will initiate WC mobility training PT Short Term Goal 3 (Week 2): Pt will sit EOB with min assist from PT while engaged in functional task.  Skilled Therapeutic Interventions/Progress Updates:   Pt received supine in bed and agreeable to PT. Rolling R and L with max fading to mod assist with max cues for initiation of movement through trunk and pelvis. Pt noted to have been incontinent of bowel. Rolling to the R for PT to perform pericare. Pt noted to have additional bowel movement in sidelying. PT performed all pericare and clothing management. Supine>sit transfer with max assist from PT through log roll to the L. Sitting balance EOB to perform forward/lateral reaches to the R and engage trunk closure x 5. Sit<>stand with max assist and RLE blocked. Side stepping R x 4 step with max assist from PT to improved posture, weight shift and encourage improve step height on the L. Sit>supine wth mod assist from PT and max cues for safety. Pt and left supine in bed with call bell in reach and all needs met.       Therapy Documentation Precautions:  Precautions Precautions: Fall Precaution Comments: corerack; R inattention Restrictions Weight Bearing Restrictions: No   Pain: Faces: no pain   Therapy/Group: Individual Therapy  Lorie Phenix 09/20/2019, 10:22 AM

## 2019-09-20 NOTE — Progress Notes (Signed)
Occupational Therapy Session Note  Patient Details  Name: [name] MRN: [id number] Date of Birth: January 30, 1932  Today's Date: 09/20/2019 OT Individual Time: 4680-3212 OT Individual Time Calculation (min): 42 min    Short Term Goals: Week 2:  OT Short Term Goal 1 (Week 2): Pt will sit EOB statically for ~5 min with supervison in prep for ADL task OT Short Term Goal 2 (Week 2): Pt will attend to familar ADL task demonstrating sustained attention for ~3 min with min cues OT Short Term Goal 3 (Week 2): Pt will don shirt with mod A in supported sitting at sink level OT Short Term Goal 4 (Week 2): Pt will roll in bed with mod A in prep for ADL tasks  Skilled Therapeutic Interventions/Progress Updates:  Patient met lying supine in bed in agreement with OT treatment session with focus on self-care re-education, functional cognition in prep for BADLs, and functional transfers as detailed below. LB dressing in supine with Max A. Patient more alert this date with ability to state first and last name. Patient able to roll to R in supine with Mod A this date. Max A required for rolling to L. Supine to EOB with Max A. Patient able to maintain static sitting balance at EOB with close supervision A for ~5 minutes. Patient able to pull shirt off over head with assist to gather shirt. After OT threaded RUE, patient able to thread LUE and pull clean shirt over head. Seated at sink level, patient completed oral hygiene with Mod A and cues for thoroughness with suction toothbrush. Total A for wc transport to dayroom. Patient engaged in dynamic sitting balance activity in TIS wc with incorporation of functional cognition. Patient able to follow  1-2 step verbal commands with >75% accuracy. Session concluded with patient seated in TIS wc with call bell within reach, belt alarm activated, and daughter present at bedside.   Therapy Documentation Precautions:  Precautions Precautions: Fall Precaution Comments:  corerack; R inattention Restrictions Weight Bearing Restrictions: No General:     Therapy/Group: Individual Therapy  [name] 09/20/2019, 8:14 AM

## 2019-09-20 NOTE — Progress Notes (Signed)
Speech Language Pathology Daily Session Note  Patient Details  Name: [name] MRN: [id number] Date of Birth: 1932-06-30  Today's Date: 09/20/2019 SLP Individual Time: 1115-1200 SLP Individual Time Calculation (min): 45 min  Short Term Goals: Week 2: SLP Short Term Goal 1 (Week 2): Pt will consume current diet with Mod A verbal/visual cues for implementation of compensatory swallow strategies for oral clearance and minimize aspiration risk. SLP Short Term Goal 2 (Week 2): Pt will demonstrate efficient mastication and oral clearance of Dys 2 solid trials a minimum of 2 times prior to diet advancement. SLP Short Term Goal 3 (Week 2): Pt will communicate basic wants and needs at the word level with Mod A verbal and/or visual cues. SLP Short Term Goal 4 (Week 2): Pt will name common objects with 80%  accuracy provided Mod A multimodal cues. SLP Short Term Goal 5 (Week 2): Pt will match words to common objects with 60% accuracy with Max A multimodal cues. SLP Short Term Goal 6 (Week 2): Pt will demonstrate awareness of functional and/or verbal errors with Max A multimodal cues.  Skilled Therapeutic Interventions: Skilled treatment session focused on dysphagia and communication goals. Upon arrival, patient appeared awake and alert while sitting upright in the wheelchair. Patient was smiling and winking at clinician and utilized head nods in 100% of opportunities in response to questions. Patient indicated she was thirsty and consumed nectar-thick liquids via tsp. Patient consumed 4 sips without overt s/s of aspiration but indicated difficulty swallowing X 1 sip by waving her hand and demonstrated delayed coughing. Recommend patient continue current diet. When SLP attempted to switch focus to communication, patient abruptly appeared fatigued by keeping her head down and eyes closed. Patient with minimal initiation and did not respond to any question despite Max A and multimodal communication. Difficult  to differentiate frustration with communication tasks vs fatigue. Patient left upright in the wheelchair with alarm on and all needs within reach. Continue with current plan of care.      Pain No/Denies Pain   Therapy/Group: Individual Therapy  Jill Ruppe 09/20/2019, 3:10 PM

## 2019-09-20 NOTE — Plan of Care (Deleted)
  Problem: RH Swallowing Goal: LTG Pt will demonstrate functional change in swallow as evidenced by bedside/clinical objective assessment (SLP) Description: LTG: Patient will demonstrate functional change in swallow as evidenced by bedside/clinical objective assessment (SLP) Outcome: Not Applicable Note: D/C goal-no longer appropriate for current level of progress   Problem: RH Expression Communication Goal: LTG Patient will verbally express basic/complex needs(SLP) Description: LTG:  Patient will verbally express basic/complex needs, wants or ideas with cues  (SLP) Outcome: Not Applicable Note: D/C Goal-No longer appropriate for current level of progress   Problem: RH Attention Goal: LTG Patient will demonstrate this level of attention during functional activites (SLP) Description: LTG:  Patient will will demonstrate this level of attention during functional activites (SLP) Flowsheets (Taken 09/20/2019 0626) LTG: Patient will demonstrate this level of attention during cognitive/linguistic activities with assistance of (SLP): Moderate Assistance - Patient 50 - 74% Note: Goal downgraded due to current level of progress   Problem: RH Awareness Goal: LTG: Patient will demonstrate awareness during functional activites type of (SLP) Description: LTG: Patient will demonstrate awareness during functional activites type of (SLP) Flowsheets (Taken 09/20/[phone]) Patient will demonstrate during cognitive/linguistic activities awareness type of: Emergent LTG: Patient will demonstrate awareness during cognitive/linguistic activities with assistance of (SLP): Maximal Assistance - Patient 25 - 49% Note: Goal downgraded due to current level of progress

## 2019-09-20 NOTE — Patient Care Conference (Addendum)
Inpatient RehabilitationTeam Conference and Plan of Care Update Date: 09/20/2019   Time: 10:23 AM    Patient Name: [name]      Medical Record Number: [id number]  Date of Birth: 09-16-1932 Sex: Female         Room/Bed: 4W22C/4W22C-01 Payor Info: Payor: MEDICARE / Plan: MEDICARE PART A AND B / Product Type: *No Product type* /    Admit Date/Time:  09/07/2019  5:12 PM  Primary Diagnosis:  Left middle cerebral artery stroke [facility])  Hospital Problems: Principal Problem:   Left middle cerebral artery stroke [facility]) Active Problems:   Atrial fibrillation ([place])   Mild cognitive impairment   HTN (hypertension)   Urinary retention    Expected Discharge Date: Expected Discharge Date: 09/26/19 (SNF pending PEG placement)  Team Members Present: Physician leading conference: Dr. Alysia Penna Care Coodinator Present: Dorien Chihuahua, RN, BSN, CRRN;Christina Sampson Goon, [place] Nurse Present: Other (comment) Tommie Sams, RN) PT Present: Barrie Folk, PT OT Present: Turner Daniels, OT SLP Present: Weston Anna, SLP PPS Coordinator present : Ileana Ladd, Burna Mortimer, SLP     Current Status/Progress Goal Weekly Team Focus  Bowel/Bladder   In and out caths q8hr no void; Incontinent of bowel, last BM 9/1  Become continent x2; no longer require q8hr caths  Assess every shift and as needed   Swallow/Nutrition/ Hydration   Dys. 1 textures with nectar-thick liquids, Mod A  Min A  tolerance of current diet with use of strategies   ADL's   Max A UB dressing, Dependent LB dressing, Dependent toileting, no OOB toilet transfer has been attempted, +2 assist slideboard/squat pivot/and Stedy transfers  Min A overall  Functional transfers, alertness, NMR, praxis, balance, family education   Mobility   minimal change in past week but more bouts of alertness, max to total A bed mobilty, maxA x 1-2 squat pivot or SB transfers, mod to max sitting balance, continues to demonstrate  pushing in sitting or standing, maxA to total A in parallel bars STS  modA transfers, minA bed mobility, minA w/c mobiltiy  sitting blance, bed mobility, transfers   Communication   Max-Total A  Mod for multimodal communication, Max A for naming  yes/no accuracy, multimodal communication, verbal expression at the word level   Safety/Cognition/ Behavioral Observations  Max A  Mod A for attention, Max A for emergent awareness  sustained attention, initiaion, awareness   Pain   Denied pain during shift  Pain <3/10  Assess every shift and as needed   Skin   Bruises to arms and legs  Prevent breakdown  Assess every shift and as needed     Discharge Planning:  Family goal to discharge to SNF (hard time recieving bed offer due to level of care) Referral being sent out to other [employer].   Team Discussion: Fluctating cognition even with Ritalin trial. Voiding occasionally however continue I+O intermittent catheterizations.  Patient on target to meet rehab goals: no, goals revised  *See Care Plan and progress notes for long and short-term goals.   Revisions to Treatment Plan:  15/7 therapy schedule Goals downgraded to MOD-MAX assist for PT and OT  Teaching Needs: Transfers, toileting, ADL assistance, medications,  Family education on small meals and food options that are pudding consistency with D1 Nectar diet  Current Barriers to Discharge: Decreased caregiver support, Neurogenic bowel and bladder, Pending surgery, Nutritional means and IR consult for PEG placement potential  Possible Resolutions to Barriers: SNF placement at discharge with foley  catheter PEG placement     Medical Summary Current Status: urinary retention requiring caths, intermittent lethargy,pushing improved, total A amb, poor intake of fluids and calories  Barriers to Discharge: Medical stability   Possible Resolutions to [employer] Focus: ritalin trial,consider foley for D/C with uro f/u ,  PEG prior dc   Continued Need for Acute Rehabilitation Level of Care: The patient requires daily medical management by a physician with specialized training in physical medicine and rehabilitation for the following reasons: Direction of a multidisciplinary physical rehabilitation program to maximize functional independence : Yes Medical management of patient stability for increased activity during participation in an intensive rehabilitation regime.: Yes Analysis of laboratory values and/or radiology reports with any subsequent need for medication adjustment and/or medical intervention. : Yes   I attest that I was present, lead the team conference, and concur with the assessment and plan of the team.   Dorien Chihuahua B 09/20/2019, 1:26 PM

## 2019-09-20 NOTE — Progress Notes (Signed)
Patient ID: [name], female   DOB: 09-05-1932, 86 y.o.   MRN: [id number]   SW left voicemail with AD that patient family would like to accept bed offer at [facility]

## 2019-09-20 NOTE — Progress Notes (Signed)
Patient ID: [name], female   DOB: 07/04/1932, 86 y.o.   MRN: [id number]  Team Conference Report to Patient/Family  Team Conference discussion was reviewed with the patient and caregiver, including goals, any changes in plan of care and target discharge date.  Patient and caregiver express understanding and are in agreement.  The patient has a target discharge date of 09/26/19 (SNF pending PEG placement).  [name] 09/20/2019, 1:45 PM

## 2019-09-20 NOTE — Progress Notes (Signed)
PHYSICAL MEDICINE & REHABILITATION PROGRESS NOTE   Subjective/Complaints:spoke with daughter who is HCPOA about dysphagia and poor oral intake   ROS: limited due to language/communication    Objective:   DG Chest 2 View  Result Date: 09/19/2019 CLINICAL DATA:  Cough. EXAM: CHEST - 2 VIEW COMPARISON:  09/06/2019. FINDINGS: Feeding tube noted in stable position. Heart size stable. Low lung volumes. Mild bibasilar subsegmental atelectasis. Right base infiltrate cannot be excluded. Tiny right pleural effusion. No pneumothorax. Degenerative changes thoracic spine. IMPRESSION: 1.  Feeding tube noted in stable position. 2. Low lung volumes with mild bibasilar subsegmental atelectasis. Mild right base infiltrate cannot be excluded. Electronically Signed   By: Marcello Moores  Register   On: 09/19/2019 14:05   DG Abd 1 View  Result Date: 09/18/2019 CLINICAL DATA:  Evaluate feeding tube. EXAM: ABDOMEN - 1 VIEW COMPARISON:  09/03/2019 and prior studies FINDINGS: A small bore feeding tube is noted with tip at the pylorus. No dilated bowel loops are noted. Colonic diverticulosis identified. Prior surgical fusion changes again noted. IMPRESSION: Small bore feeding tube with tip at the pylorus. Electronically Signed   By: Margarette Canada M.D.   On: 09/18/2019 12:28   Recent Labs    09/19/19 1115  WBC 8.0  HGB 13.8  HCT 42.2  PLT 406*   No results for input(s): NA, K, CL, CO2, GLUCOSE, BUN, CREATININE, CALCIUM in the last 72 hours.  Intake/Output Summary (Last 24 hours) at 09/20/2019 0816 Last data filed at 09/20/2019 0140 Gross per 24 hour  Intake 360 ml  Output 1850 ml  Net -1490 ml     Physical Exam: Vital Signs Blood pressure (!) 141/77, pulse 78, temperature 98.2 F (36.8 C), temperature source Axillary, resp. rate 18, height _0  (1.651 m), weight 78.7 kg, SpO2 99 %.  General: No acute distress Mood and affect are appropriate Heart: Regular rate and rhythm no rubs murmurs or extra  sounds Lungs: Clear to auscultation, breathing unlabored, no rales or wheezes Abdomen: Positive bowel sounds, soft nontender to palpation, nondistended Extremities: No clubbing, cyanosis, or edema Skin: No evidence of breakdown, no evidence of rash  Skin: Ecchymosis on forearm, right, no evidence of rash Neurologic: 4/5 in the left deltoid bicep tricep grip hip flexion knee extension ankle flexion Medical extremity without active movement right lower extremity has trace ankle plantarflexion  Musculoskeletal:No joint swelling    Assessment/Plan: 1. Functional deficits secondary to Left BG /MCA infarct  which require 3+ hours per day of interdisciplinary therapy in a comprehensive inpatient rehab setting.  Physiatrist is providing close team supervision and 24 hour management of active medical problems listed below.  Physiatrist and rehab team continue to assess barriers to discharge/monitor patient progress toward functional and medical goals  Care Tool:  Bathing        Body parts bathed by helper: Right arm, Right lower leg, Left arm, Chest, Left lower leg, Face, Abdomen, Front perineal area, Buttocks, Right upper leg, Left upper leg     Bathing assist Assist Level: 2 Helpers     Upper Body Dressing/Undressing Upper body dressing   What is the patient wearing?: Pull over shirt    Upper body assist Assist Level: Maximal Assistance - Patient 25 - 49%    Lower Body Dressing/Undressing Lower body dressing      What is the patient wearing?: Pants     Lower body assist Assist for lower body dressing: 2 Helpers     Toileting Toileting  Toileting assist Assist for toileting: Dependent - Patient 0%     Transfers Chair/bed transfer  Transfers assist  Chair/bed transfer activity did not occur: Safety/medical concerns (would recommend lift for patient- needed skilled intervention to transfer)  Chair/bed transfer assist level: 2 Helpers      Locomotion Ambulation   Ambulation assist   Ambulation activity did not occur: Safety/medical concerns          Walk 10 feet activity   Assist  Walk 10 feet activity did not occur: Safety/medical concerns        Walk 50 feet activity   Assist Walk 50 feet with 2 turns activity did not occur: Safety/medical concerns         Walk 150 feet activity   Assist Walk 150 feet activity did not occur: Safety/medical concerns         Walk 10 feet on uneven surface  activity   Assist Walk 10 feet on uneven surfaces activity did not occur: Safety/medical concerns         Wheelchair     Assist Will patient use wheelchair at discharge?: Yes      Wheelchair assist level: Dependent - Patient 0% Max wheelchair distance: 150    Wheelchair 50 feet with 2 turns activity    Assist        Assist Level: Dependent - Patient 0%   Wheelchair 150 feet activity     Assist      Assist Level: Dependent - Patient 0%   Blood pressure (!) 141/77, pulse 78, temperature 98.2 F (36.8 C), temperature source Axillary, resp. rate 18, height _0  (1.651 m), weight 78.7 kg, SpO2 99 %.  Medical Problem List and Plan: 1.Right side weakness and aphasiasecondary to left MCA territory infarction status post IR revascularization with SAH -patient may Shower if has good enough sitting balance -ELOS/Goals: 3-4 weeks- goals min A /mod A  Team conference today please see physician documentation under team conference tab, met with team  to discuss problems,progress, and goals. Formulized individual treatment plan based on medical history, underlying problem and comorbidities.   Continue CIR 2. Antithrombotics: -DVT/anticoagulation:SCDs- had petechial hemrrhages -antiplatelet therapy: Aspirin 81 mg daily 3. Pain Management:will schedule  Tylenol q6 hours through NGT. Well controlled.  4. Mood:Zoloft 50 mg daily, Aricept,  February 9 10 mg nightly-  -Methylphenidate increase to 10 mg BID-antipsychotic agents: N/A 5. Neuropsych: This patientisNOTcapable of making decisions on herown behalf. 6. Skin/Wound Care:Routine skin checks 7. Fluids/Electrolytes/Nutrition:Routine in and outs with follow-up chemistries, Change TF times 8. Dysphagia. Tube feeds as directed. Dietary follow-up. IR to assess for   PEG   -monitor intake as well as S/Sx of asp on po   -We will check chest x-ray, check CBC  9. Hypertension. Lopressor 50 mg twice daily, Cozaar 25 mg daily, Norvasc 10 mg daily.  Vitals:   09/19/19 2002 09/20/19 0534  BP: 131/74 (!) 141/77  Pulse: 86 78  Resp:  18  Temp: (!) 97.5 F (36.4 C) 98.2 F (36.8 C)  SpO2: 97% 99%   Good control 9/1 10. Hyperlipidemia. Lipitor 11. History of alcohol use. Provide counseling with patient and family 48. Atrial fibrillation. No anticoagulation at this time.   Continue beta-blocker  HR controlled 8/28. 13. Urinary retention/proteus UTI- discussed with daughter this may cause a temporary setback in recovery   -keflex 265m TID started 8/21  -Keflex just discontinued yesterday, 10-day course, afebrile  -req I/O caths, volumes 350-450 cc overnight into this morning  urecholine to help with emptying- increased to 79m 8/24, Flomax started 9/1  -purewick if needed to keep skin dry, but mostly retaining right now 14. Sleep-/insomnia- pt took 30, not 3 mg of Melatonin nightly at home AND 3+ pills of ZZZquil at home to sleep nightly per daughter- FJuluis Rainier 8/28: daughter is concerned regarding sleep apnea. Would like outpatient sleep study.   Sleep  Graph  15.  Constipation small daily BM will check KUB, schedule senokot, (not senna S due to mushy stools), may cause some GI discomfort, Already on Protonix given history of alcohol abuse with potential for gastritis LOS: 13 days A FACE TO FACE EVALUATION WAS PERFORMED  ALuanna SalkKirsteins 09/20/2019,  8:16 AM

## 2019-09-20 NOTE — Plan of Care (Signed)
  Problem: RH Attention Goal: LTG Patient will demonstrate this level of attention during functional activites (SLP) Description: LTG:  Patient will will demonstrate this level of attention during functional activites (SLP) Flowsheets (Taken 09/20/2019 0626) LTG: Patient will demonstrate this level of attention during cognitive/linguistic activities with assistance of (SLP): Moderate Assistance - Patient 50 - 74% Note: Goal downgraded due to current level of progress   Problem: RH Awareness Goal: LTG: Patient will demonstrate awareness during functional activites type of (SLP) Description: LTG: Patient will demonstrate awareness during functional activites type of (SLP) Flowsheets (Taken 09/20/2019 0626) Patient will demonstrate during cognitive/linguistic activities awareness type of: Emergent LTG: Patient will demonstrate awareness during cognitive/linguistic activities with assistance of (SLP): Maximal Assistance - Patient 25 - 49% Note: Goal downgraded due to current level of progress    Problem: RH Expression Communication Goal: LTG Patient will express needs/wants via multi-modal(SLP) Description: LTG:  Patient will express needs/wants via multi-modal communication (gestures/written, etc) with cues (SLP) Flowsheets (Taken 09/20/2019 0629) LTG: Patient will express needs/wants via multimodal communication (gestures/written, etc) with cueing (SLP): Moderate Assistance - Patient 50 - 74% Note: New Goal due to current level of progress Goal: LTG Patient will increase word finding of common (SLP) Description: LTG:  Patient will increase word finding of common objects/daily info/abstract thoughts with cues using compensatory strategies (SLP). Flowsheets (Taken 09/20/2019 [phone]) LTG: Patient will increase word finding of common (SLP): Maximal Assistance - Patient 25 - 49% Patient will use compensatory strategies to increase word finding of: Common objects Note: New Goal due to current level of  progress   Problem: RH Swallowing Goal: LTG Pt will demonstrate functional change in swallow as evidenced by bedside/clinical objective assessment (SLP) Description: LTG: Patient will demonstrate functional change in swallow as evidenced by bedside/clinical objective assessment (SLP) Outcome: Not Applicable Note: D/C goal-no longer appropriate for current level of progress   Problem: RH Expression Communication Goal: LTG Patient will verbally express basic/complex needs(SLP) Description: LTG:  Patient will verbally express basic/complex needs, wants or ideas with cues  (SLP) Outcome: Not Applicable Note: D/C Goal-No longer appropriate for current level of progress

## 2019-09-20 NOTE — Progress Notes (Signed)
Patient ID: [name], female   DOB: 08-08-1932, 86 y.o.   MRN: [id number]   Sw informed daughter Casimer Bilis) inside room of all bed offers currently. Will follow up with daughter this evening on decision.  [place], [place]

## 2019-09-21 ENCOUNTER — Inpatient Hospital Stay (HOSPITAL_COMMUNITY): Payer: Medicare Other | Admitting: Physical Therapy

## 2019-09-21 ENCOUNTER — Encounter (HOSPITAL_COMMUNITY): Payer: Self-pay | Admitting: Physical Medicine & Rehabilitation

## 2019-09-21 ENCOUNTER — Inpatient Hospital Stay (HOSPITAL_COMMUNITY): Payer: Medicare Other | Admitting: Occupational Therapy

## 2019-09-21 ENCOUNTER — Inpatient Hospital Stay (HOSPITAL_COMMUNITY): Payer: Medicare Other

## 2019-09-21 LAB — URINALYSIS, ROUTINE W REFLEX MICROSCOPIC
Bilirubin Urine: NEGATIVE
Glucose, UA: NEGATIVE mg/dL
Hgb urine dipstick: NEGATIVE
Ketones, ur: NEGATIVE mg/dL
Nitrite: NEGATIVE
Protein, ur: 30 mg/dL — AB
Specific Gravity, Urine: 1.019 (ref 1.005–1.030)
WBC, UA: >50 WBC/hpf — ABNORMAL HIGH (ref 0–5)
pH: 6 (ref 5.0–8.0)

## 2019-09-21 NOTE — Progress Notes (Signed)
[place] PHYSICAL MEDICINE & REHABILITATION PROGRESS NOTE   Subjective/Complaints:   CT of the abdomen  to assess feasibility of performing percutaneous gastrostomy.  Films pending IR review Patient has been uncomfortable according to her daughter this morning but awake intermittently.  She was awake at 7 but at around 8:00 with speech therapy became more somnolent and is more awake again around 9 AM.  She has a PT session right now. We discussed review of CT scan including lumbar spine degeneration.  We discussed this could be an etiology of discomfort in this  Patient Daughter is concerned that the urine is turning dark again like it was when the patient had a UTI.  The patient has been off of antibiotics for about 3 days  ROS: limited due to language/communication    Objective:   DG Chest 2 View  Result Date: 09/19/2019 CLINICAL DATA:  Cough. EXAM: CHEST - 2 VIEW COMPARISON:  09/06/2019. FINDINGS: Feeding tube noted in stable position. Heart size stable. Low lung volumes. Mild bibasilar subsegmental atelectasis. Right base infiltrate cannot be excluded. Tiny right pleural effusion. No pneumothorax. Degenerative changes thoracic spine. IMPRESSION: 1.  Feeding tube noted in stable position. 2. Low lung volumes with mild bibasilar subsegmental atelectasis. Mild right base infiltrate cannot be excluded. Electronically Signed   By: Marcello Moores  Register   On: 09/19/2019 14:05   Recent Labs    09/19/19 1115  WBC 8.0  HGB 13.8  HCT 42.2  PLT 406*   No results for input(s): NA, K, CL, CO2, GLUCOSE, BUN, CREATININE, CALCIUM in the last 72 hours.  Intake/Output Summary (Last 24 hours) at 09/21/2019 0910 Last data filed at 09/21/2019 0530 Gross per 24 hour  Intake 1586.33 ml  Output 1500 ml  Net 86.33 ml     Physical Exam: Vital Signs Blood pressure (!) 156/89, pulse 81, temperature 97.6 F (36.4 C), resp. rate 20, height 5\' 5"  (1.651 m), weight 78.7 kg, SpO2 100 %.   General: No acute  distress Mood and affect are appropriate Heart: Regular rate and rhythm no rubs murmurs or extra sounds Lungs: Clear to auscultation, breathing unlabored, no rales or wheezes Abdomen: Positive bowel sounds, negative tympany soft nontender to palpation, nondistended, Extremities: No clubbing, cyanosis, or edema Skin: No evidence of breakdown, no evidence of rash   Skin: Ecchymosis on forearm, right, no evidence of rash Neurologic: 4/5 in the left deltoid bicep tricep grip hip flexion knee extension ankle flexion Medical extremity without active movement right lower extremity has trace ankle plantarflexion  Musculoskeletal:No joint swelling, patient with poor bed positioning, leans toward the right side    Assessment/Plan: 1. Functional deficits secondary to Left BG /MCA infarct  which require 3+ hours per day of interdisciplinary therapy in a comprehensive inpatient rehab setting.  Physiatrist is providing close team supervision and 24 hour management of active medical problems listed below.  Physiatrist and rehab team continue to assess barriers to discharge/monitor patient progress toward functional and medical goals  Care Tool:  Bathing        Body parts bathed by helper: Right arm, Right lower leg, Left arm, Chest, Left lower leg, Face, Abdomen, Front perineal area, Buttocks, Right upper leg, Left upper leg     Bathing assist Assist Level: 2 Helpers     Upper Body Dressing/Undressing Upper body dressing   What is the patient wearing?: Pull over shirt    Upper body assist Assist Level: Maximal Assistance - Patient 25 - 49%  Lower Body Dressing/Undressing Lower body dressing      What is the patient wearing?: Pants     Lower body assist Assist for lower body dressing: 2 Helpers     Toileting Toileting    Toileting assist Assist for toileting: Dependent - Patient 0%     Transfers Chair/bed transfer  Transfers assist  Chair/bed transfer activity did not  occur: Safety/medical concerns (would recommend lift for patient- needed skilled intervention to transfer)  Chair/bed transfer assist level: 2 Helpers     Locomotion Ambulation   Ambulation assist   Ambulation activity did not occur: Safety/medical concerns          Walk 10 feet activity   Assist  Walk 10 feet activity did not occur: Safety/medical concerns        Walk 50 feet activity   Assist Walk 50 feet with 2 turns activity did not occur: Safety/medical concerns         Walk 150 feet activity   Assist Walk 150 feet activity did not occur: Safety/medical concerns         Walk 10 feet on uneven surface  activity   Assist Walk 10 feet on uneven surfaces activity did not occur: Safety/medical concerns         Wheelchair     Assist Will patient use wheelchair at discharge?: Yes      Wheelchair assist level: Dependent - Patient 0% Max wheelchair distance: 150    Wheelchair 50 feet with 2 turns activity    Assist        Assist Level: Dependent - Patient 0%   Wheelchair 150 feet activity     Assist      Assist Level: Dependent - Patient 0%   Blood pressure (!) 156/89, pulse 81, temperature 97.6 F (36.4 C), resp. rate 20, height 5\' 5"  (1.651 m), weight 78.7 kg, SpO2 100 %.  Medical Problem List and Plan: 1.Right side weakness and aphasiasecondary to left MCA territory infarction status post IR revascularization with SAH -patient may Shower if has good enough sitting balance -ELOS/Goals: Plan for discharge 9/7 to SNF, pending completion of gastrostomy     Continue CIR 2. Antithrombotics: -DVT/anticoagulation:SCDs- had petechial hemrrhages -antiplatelet therapy: Aspirin 81 mg daily 3. Pain Management:will schedule  Tylenol q6 hours through NGT. Well controlled.  4. Mood:Zoloft 50 mg daily, Aricept, February 9 10 mg nightly-  -Methylphenidate increase to 10 mg  BID-antipsychotic agents: N/A 5. Neuropsych: This patientisNOTcapable of making decisions on herown behalf. 6. Skin/Wound Care:Routine skin checks 7. Fluids/Electrolytes/Nutrition:Routine in and outs with follow-up chemistries, Change TF times 8. Dysphagia. Tube feeds as directed. Dietary follow-up. IR to assess for   PEG   -monitor intake as well as S/Sx of asp on po   -We will check chest x-ray, check CBC  9. Hypertension. Lopressor 50 mg twice daily, Cozaar 25 mg daily, Norvasc 10 mg daily.  Vitals:   09/20/19 1938 09/21/19 0520  BP: (!) 161/87 (!) 156/89  Pulse: 85 81  Resp:    Temp: 97.8 F (36.6 C) 97.6 F (36.4 C)  SpO2: 99% 100%  suboptimal  control 9/2, starting flomax for bladder  Monitor BP effects  10. Hyperlipidemia. Lipitor 11. History of alcohol use. Provide counseling with patient and family 96. Atrial fibrillation. No anticoagulation at this time.   Continue beta-blocker  HR controlled 8/28. 13. Urinary retention/proteus UTI- discussed with daughter this may cause a temporary setback in recovery   Recheck UA C&S, cath specimen  urecholine to help with emptying- increased to 25mg  8/24, Flomax started 9/2  -purewick if needed to keep skin dry, but mostly retaining right now 14. Sleep-/insomnia- pt took 30, not 3 mg of Melatonin nightly at home AND 3+ pills of ZZZquil at home to sleep nightly per daughter- Juluis Rainier  8/28: daughter is concerned regarding sleep apnea. Would like outpatient sleep study.   Sleep  Graph  15.  Constipation small daily BM will check KUB, schedule senokot, (not senna S due to mushy stools), may cause some GI discomfort, Already on Protonix given history of alcohol abuse with potential for gastritis LOS: 14 days A FACE TO FACE EVALUATION WAS PERFORMED  Charlett Blake 09/21/2019, 9:10 AM

## 2019-09-21 NOTE — Consult Note (Signed)
Chief Complaint: Patient was seen in consultation today for dysphagia, CVA  Referring Physician(s): Dr. Letta Pate  Supervising Physician: Sandi Mariscal  Patient Status: [facility] - In-pt  History of Present Illness: [place] RUBBY BARBARY is a 86 y.o. female with past medical history of CVA s/p cerebral arteriogram with emergent mechanical thrombectomy of left MCA M1 occlusion who was admitted to inpatient rehab for ongoing therapy.  She has residual right sided weakness and ongoing dysphagia.  She has been receiving supplemental tube feeding via [place].  She is anticipating discharge to SNF early next week.  IR consulted for percutaneous gastrostomy tube placement.   CT Abdomen obtained and reviewed by Dr. Anselm Pancoast who approves patient for procedure.   PA met with patient and family at bedside.  Also called POA to discuss the procedure.  Patient is alert, but aphasia.  She has been placed on Dysphagia 1 diet, but continues supplemental enteral nutrition support.  Family is agreeable to placement.   Past Medical History:  Diagnosis Date  . Arthritis   . Atrial fibrillation ([place])   . Cataract    BILATERAL  . DDD (degenerative disc disease), cervical   . Depression   . Diverticulosis   . Falls   . GERD (gastroesophageal reflux disease)   . Glaucoma   . Headache   . Hyperlipidemia   . Hyperlipidemia   . Hypertension   . Osteoporosis   . Peripheral neuropathy   . Spondylosis    lumbar  . Subdural hematoma [facility])     Past Surgical History:  Procedure Laterality Date  . APPENDECTOMY    . BACK SURGERY  2010  . BURR HOLE FOR SUBDURAL HEMATOMA     x 2 hematoma  . COLONOSCOPY    . IR CT HEAD LTD  09/04/2019  . IR PERCUTANEOUS ART THROMBECTOMY/INFUSION INTRACRANIAL INC DIAG ANGIO  09/02/2019      . IR PERCUTANEOUS ART THROMBECTOMY/INFUSION INTRACRANIAL INC DIAG ANGIO  09/04/2019  . KNEE SURGERY Bilateral   . RADIOLOGY WITH ANESTHESIA N/A 09/02/2019   Procedure: IR WITH ANESTHESIA;  Surgeon:  Radiologist, Medication, MD;  Location: [place];  Service: Radiology;  Laterality: N/A;  . VAGINAL HYSTERECTOMY      Allergies: Codeine  Medications: Prior to Admission medications   Medication Sig Start Date End Date Taking? Authorizing Provider  acetaminophen (TYLENOL) 160 MG/5ML solution Place 20.3 mLs (650 mg total) into feeding tube every 4 (four) hours as needed for mild pain (or temp > 37.5 C (99.5 F)). 09/07/19   Donzetta Starch, NP  amantadine (SYMMETREL) 50 MG/5ML solution Place 10 mLs (100 mg total) into feeding tube daily. 09/08/19   Donzetta Starch, NP  amLODipine (NORVASC) 10 MG tablet Place 1 tablet (10 mg total) into feeding tube daily. 09/08/19   Donzetta Starch, NP  aspirin 81 MG chewable tablet Place 1 tablet (81 mg total) into feeding tube daily. 09/08/19   Donzetta Starch, NP  atorvastatin (LIPITOR) 40 MG tablet Place 1 tablet (40 mg total) into feeding tube daily. 09/08/19   Donzetta Starch, NP  chlorhexidine gluconate, MEDLINE KIT, (PERIDEX) 0.12 % solution 15 mLs by Mouth Rinse route 2 (two) times daily. 09/07/19   Donzetta Starch, NP  donepezil (ARICEPT) 10 MG tablet Place 1 tablet (10 mg total) into feeding tube at bedtime. 09/07/19   Donzetta Starch, NP  glycopyrrolate (ROBINUL) 0.2 MG/ML injection Inject 0.5 mLs (0.1 mg total) into the vein every 4 (four) hours as needed (  for excessive secretions). 09/07/19   Donzetta Starch, NP  latanoprost (XALATAN) 0.005 % ophthalmic solution Place 1 drop into both eyes at bedtime. 09/07/19   Donzetta Starch, NP  losartan (COZAAR) 25 MG tablet Place 1 tablet (25 mg total) into feeding tube daily. 09/08/19   Donzetta Starch, NP  metoprolol tartrate (LOPRESSOR) 25 mg/10 mL SUSP Place 20 mLs (50 mg total) into feeding tube 2 (two) times daily. 09/07/19   Donzetta Starch, NP  Multiple Vitamin (MULTIVITAMIN) LIQD Place 15 mLs into feeding tube daily. 09/08/19   Donzetta Starch, NP  Nutritional Supplements (FEEDING SUPPLEMENT, OSMOLITE 1.2 CAL,) LIQD Place 1,000  mLs into feeding tube continuous. 09/07/19   Donzetta Starch, NP  Nutritional Supplements (FEEDING SUPPLEMENT, PROSOURCE TF,) liquid Place 45 mLs into feeding tube 2 (two) times daily. 09/07/19   Donzetta Starch, NP  pantoprazole sodium (PROTONIX) 40 mg/20 mL PACK Place 20 mLs (40 mg total) into feeding tube at bedtime. 09/07/19   Donzetta Starch, NP  senna-docusate (SENOKOT-S) 8.6-50 MG tablet Take 1 tablet by mouth at bedtime as needed for mild constipation. 09/07/19   Donzetta Starch, NP  sertraline (ZOLOFT) 50 MG tablet Place 1 tablet (50 mg total) into feeding tube daily. 09/08/19   Donzetta Starch, NP  sodium chloride 0.9 % infusion Inject 50 mLs into the vein continuous. 09/07/19   Donzetta Starch, NP  timolol (TIMOPTIC) 0.5 % ophthalmic solution Place 1 drop into both eyes daily.    [provider]  triamcinolone lotion (KENALOG) 0.1 % Apply 1 application topically daily as needed (itching).    [provider]  vitamin B-12 1000 MCG tablet Place 3 tablets (3,000 mcg total) into feeding tube daily. 09/08/19   Donzetta Starch, NP     Family History  Problem Relation Age of Onset  . Heart disease Father   . Ovarian cancer Mother        ?  Marland Kitchen Breast cancer Sister   . Colon cancer Neg Hx   . Diabetes Neg Hx   . Liver disease Neg Hx   . Kidney disease Neg Hx   . Dementia Neg Hx     Social History   Socioeconomic History  . Marital status: Widowed    Spouse name: Jenny Reichmann   . Number of children: 2  . Years of education: 16+  . Highest education level: Not on file  Occupational History  . Not on file  Tobacco Use  . Smoking status: Former Research scientist (life sciences)  . Smokeless tobacco: Never Used  . Tobacco comment: [place]  Vaping Use  . Vaping Use: Never used  Substance and Sexual Activity  . Alcohol use: Yes    Alcohol/week: 14.0 standard drinks    Types: 14 Glasses of wine per week    Comment: 2 glasses of wine per nite  . Drug use: No  . Sexual  activity: Not on file  Other Topics Concern  . Not on file  Social History Narrative   Lives at retirement community with husband, Madie Reno.   Caffeine use: 1 cups coffee in the morning   Right-handed   Social Determinants of Health   Financial Resource Strain:   . Difficulty of Paying Living Expenses: Not on file  Food Insecurity:   . Worried About Charity fundraiser in the Last Year: Not on file  . Ran Out of Food in the Last  Year: Not on file  Transportation Needs:   . Lack of Transportation (Medical): Not on file  . Lack of Transportation (Non-Medical): Not on file  Physical Activity:   . Days of Exercise per Week: Not on file  . Minutes of Exercise per Session: Not on file  Stress:   . Feeling of Stress : Not on file  Social Connections:   . Frequency of Communication with Friends and Family: Not on file  . Frequency of Social Gatherings with Friends and Family: Not on file  . Attends Religious Services: Not on file  . Active Member of Clubs or Organizations: Not on file  . Attends Archivist Meetings: Not on file  . Marital Status: Not on file     Review of Systems: A 12 point ROS discussed and pertinent positives are indicated in the HPI above.  All other systems are negative.  Review of Systems  Unable to perform ROS: Patient nonverbal    Vital Signs: BP (!) 148/83 (BP Location: Left Arm)   Pulse 68   Temp (!) 97.4 F (36.3 C)   Resp 18   Ht $R'5\' 5"'EE$  (1.651 m)   Wt 173 lb 8 oz (78.7 kg)   SpO2 97%   BMI 28.87 kg/m   Physical Exam Vitals and nursing note reviewed.  Constitutional:      General: She is not in acute distress.    Appearance: Normal appearance. She is not ill-appearing.  HENT:     Mouth/Throat:     Mouth: Mucous membranes are moist.     Pharynx: Oropharynx is clear.  Cardiovascular:     Rate and Rhythm: Normal rate and regular rhythm.  Pulmonary:     Effort: Pulmonary effort is normal. No respiratory distress.     Breath  sounds: Normal breath sounds.  Abdominal:     General: Abdomen is flat. There is no distension.     Palpations: Abdomen is soft.  Skin:    General: Skin is warm and dry.  Neurological:     General: No focal deficit present.     Mental Status: She is alert and oriented to person, place, and time. Mental status is at baseline.  Psychiatric:        Mood and Affect: Mood normal.        Behavior: Behavior normal.        Thought Content: Thought content normal.        Judgment: Judgment normal.      MD Evaluation Airway: WNL Heart: WNL Abdomen: WNL Chest/ Lungs: WNL ASA  Classification: 3 Mallampati/Airway Score: Two   Imaging: CT ABDOMEN WO CONTRAST  Result Date: 09/21/2019 CLINICAL DATA:  History of left MCA infarct. Evaluate anatomy for gastrostomy tube placement. EXAM: CT ABDOMEN WITHOUT CONTRAST TECHNIQUE: Multidetector CT imaging of the abdomen was performed following the standard protocol without IV contrast. COMPARISON:  05/12/2016 FINDINGS: Lower chest: Lung bases are clear. Hepatobiliary: Normal appearance of the liver and gallbladder. Pancreas: Unremarkable. No pancreatic ductal dilatation or surrounding inflammatory changes. Spleen: Normal in size without focal abnormality. Adrenals/Urinary Tract: Normal appearance of the adrenal glands. Kidneys have a lobulated appearance without hydronephrosis. Probable tiny hyperdense cyst along the right kidney upper pole and similar to the previous examination. Stomach/Bowel: Nasogastric tube extends into the distal stomach. Stomach is in a normal location. There is no bowel positioned between the stomach in the anterior abdominal wall. The transverse colon is caudal to the stomach and incompletely imaged. Scattered colonic diverticula.  No evidence for small bowel dilatation. Vascular/Lymphatic: Abdominal aorta is heavily calcified without aneurysm. Coronary artery calcifications. No lymph node enlargement in the abdomen. Other: Negative for  ascites. Musculoskeletal: Again noted is interbody fusion at L2, L3 and L4. Plate and screw fixation along the left side of the surgically fused vertebral bodies. Stable compression deformity at L1. Disc space loss at L1-L2. IMPRESSION: 1. Anatomy is amendable for percutaneous gastrostomy tube placement. 2.  Aortic Atherosclerosis (ICD10-I70.0). Electronically Signed   By: Richarda Overlie M.D.   On: 09/21/2019 11:04   DG Chest 2 View  Result Date: 09/19/2019 CLINICAL DATA:  Cough. EXAM: CHEST - 2 VIEW COMPARISON:  09/06/2019. FINDINGS: Feeding tube noted in stable position. Heart size stable. Low lung volumes. Mild bibasilar subsegmental atelectasis. Right base infiltrate cannot be excluded. Tiny right pleural effusion. No pneumothorax. Degenerative changes thoracic spine. IMPRESSION: 1.  Feeding tube noted in stable position. 2. Low lung volumes with mild bibasilar subsegmental atelectasis. Mild right base infiltrate cannot be excluded. Electronically Signed   By: Maisie Fus  Register   On: 09/19/2019 14:05   DG Abd 1 View  Result Date: 09/18/2019 CLINICAL DATA:  Evaluate feeding tube. EXAM: ABDOMEN - 1 VIEW COMPARISON:  09/03/2019 and prior studies FINDINGS: A small bore feeding tube is noted with tip at the pylorus. No dilated bowel loops are noted. Colonic diverticulosis identified. Prior surgical fusion changes again noted. IMPRESSION: Small bore feeding tube with tip at the pylorus. Electronically Signed   By: Harmon Pier M.D.   On: 09/18/2019 12:28   CT HEAD WO CONTRAST  Result Date: 09/06/2019 CLINICAL DATA:  Stroke post left MCA thrombectomy and recanalization 09/04/2019. Follow-up intracranial hemorrhage. EXAM: CT HEAD WITHOUT CONTRAST TECHNIQUE: Contiguous axial images were obtained from the base of the skull through the vertex without intravenous contrast. COMPARISON:  CT head 09/05/2019 FINDINGS: Brain: Small amount of hemorrhage in the left putamen with associated surrounding low-density infarct.  Low density in the left putamen compatible with acute infarct also unchanged. Small amount of hemorrhage in the left sylvian fissure similar to the prior study. Hemorrhage in the left superior anterior temporal lobe unchanged. No new areas of hemorrhage Moderate atrophy. Chronic microvascular ischemic changes in the white matter. Negative for hydrocephalus Vascular: Negative for hyperdense vessel Skull: 2 burr holes in the right parietal bone. No acute skeletal abnormality Sinuses/Orbits: Chronic depressed fracture left orbital floor unchanged. Bilateral proptosis without orbital mass. Mild mucosal edema paranasal sinuses. Air-fluid level sphenoid sinus. Other: None IMPRESSION: Stable CT without evidence of new hemorrhage Hemorrhagic infarction left putamen stable. Small amount of hemorrhage in the left sylvian fissure and left superior temporal lobe anteriorly stable. Electronically Signed   By: Marlan Palau M.D.   On: 09/06/2019 13:59   CT Head Wo Contrast  Result Date: 09/05/2019 CLINICAL DATA:  Stroke follow-up.  Subdural hematoma history. EXAM: CT HEAD WITHOUT CONTRAST TECHNIQUE: Contiguous axial images were obtained from the base of the skull through the vertex without intravenous contrast. COMPARISON:  Brain MRI from 2 days ago FINDINGS: Brain: Essentially stable subarachnoid blood along the lower left sylvian fissure and adjacent sulci. Hazy hemorrhage at the left putamen is also non progressed. Cytotoxic edema is progressively apparent at the left basal ganglia and adjacent white matter tracks, expected. No interval hemorrhage, hydrocephalus, or masslike finding. Brain atrophy. Vascular: Streak artifact with no detected hyperdense vessel. Skull: Remote burr holes on the right.  No acute finding Sinuses/Orbits: Negative IMPRESSION: Left basal ganglia and inferior division infarcts  without detected progression. Unchanged petechial hemorrhage at the basal ganglia and subarachnoid hemorrhage along the  sylvian fissure. Electronically Signed   By: Monte Fantasia M.D.   On: 09/05/2019 07:35   CT HEAD WO CONTRAST  Result Date: 09/03/2019 CLINICAL DATA:  Left MCA stroke post thrombectomy EXAM: CT HEAD WITHOUT CONTRAST TECHNIQUE: Contiguous axial images were obtained from the base of the skull through the vertex without intravenous contrast. COMPARISON:  09/02/2019 FINDINGS: Brain: There is new hyperdensity of the left basal ganglia. There is also new hyperdensity about the left MCA and left sylvian fissure. No new loss of gray-white differentiation. Ventricles are stable in size. Stable findings of advanced chronic microvascular ischemic changes. No extra-axial fluid collection. No significant mass effect. Vascular: Left MCA is not well evaluated but does not appear as dense. Skull: Right burr holes.  Otherwise unremarkable. Sinuses/Orbits: Nonspecific paranasal sinus mucosal thickening. No acute orbital finding. Other: None. IMPRESSION: New hyperdensity of the previously hypodense left basal ganglia may reflect petechial reperfusion hemorrhage or contrast staining. Hyperdensity along the left MCA and sylvian fissure likely reflects small volume subarachnoid hemorrhage. Electronically Signed   By: Macy Mis M.D.   On: 09/03/2019 09:31   MR BRAIN WO CONTRAST  Result Date: 09/03/2019 CLINICAL DATA:  Left MCA stroke post thrombectomy EXAM: MRI HEAD WITHOUT CONTRAST TECHNIQUE: Multiplanar, multiecho pulse sequences of the brain and surrounding structures were obtained without intravenous contrast. COMPARISON:  Recent CT imaging, MRI brain 2018 FINDINGS: Brain: There is reduced diffusion involving the left basal ganglia and adjacent white matter, a small area within the anteromedial left temporal lobe, and small areas of the parietal and posterior left temporal lobes. There is corresponding susceptibility primarily in the basal ganglia region reflecting petechial reperfusion hemorrhage. Diffusion  hyperintensity and susceptibility along the left MCA cistern and sylvian fissure are compatible with subarachnoid hemorrhage. There is trace layering intraventricular hemorrhage in the occipital horns likely reflecting redistribution of the subarachnoid hemorrhage. Prominence of the ventricles and sulci reflects generalized parenchymal volume loss. Confluent areas of T2 hyperintensity in the supratentorial greater than pontine white matter are nonspecific but probably reflect advanced chronic microvascular ischemic changes. There is no significant mass effect. Vascular: Major vessel flow voids at the skull base are preserved. Skull and upper cervical spine: Normal marrow signal is preserved. Sinuses/Orbits: Mild mucosal thickening. Bilateral lens replacements. Other: Sella is unremarkable.  Mastoid air cells are clear. IMPRESSION: Acute infarcts of the left MCA territory involving the basal ganglia and adjacent white matter and parietotemporal lobes. Petechial reperfusion hemorrhage primarily involving the basal ganglia and small volume subarachnoid hemorrhage as seen on CT. Advanced chronic microvascular ischemic changes. Electronically Signed   By: Macy Mis M.D.   On: 09/03/2019 14:35   IR CT Head Ltd  Result Date: 09/04/2019 INDICATION: 86 year old female with past medical history significant for atrial fibrillation (not on anticoagulation due to prior subdural hematoma), hypertension, hyperlipidemia, heart disease, mild cognitive impairment of vascular etiology complicated by alcohol use with a premorbid Rankin scale of 0. She presented to ED with aphasia, left gaze deviation and nonrhythmic shaking of the left side, NIHSS 22. She was last known well at 2:30 p.m. on 09/01/2019. No IV tPA administered as patient was outside of the window. Head CT showed prominent white matter disease and atrophy with hyperdense left MCA without large acute infarct or hemorrhage. CT angiogram of the head and neck showed  a proximal left M1/MCA occlusion extending into the M2 segment. CT perfusion showed a  core infarct of 4 mL with a penumbra of 83 mL. She was then transferred to our service for a diagnostic cerebral angiogram and emergency mechanical thrombectomy. EXAM: DIAGNOSTIC CEREBRAL ANGIOGRAM MECHANICAL THROMBECTOMY FLAT PANEL HEAD CT COMPARISON:  CT/CT angiogram of the head and neck September 02, 2019 MEDICATIONS: No antibiotics given. ANESTHESIA/SEDATION: The procedure was performed in the general anesthesia. FLUOROSCOPY TIME:  Fluoroscopy Time: 31 minutes 12 seconds (678 mGy). COMPLICATIONS: None immediate. TECHNIQUE: Informed written consent was obtained from the patient's daughter after a thorough discussion of the procedural risks, benefits and alternatives. All questions were addressed. Maximal Sterile Barrier Technique was utilized including caps, mask, sterile gowns, sterile gloves, sterile drape, hand hygiene and skin antiseptic. A timeout was performed prior to the initiation of the procedure. The right groin was prepped and draped in the usual sterile fashion. Using a micropuncture kit and the modified Seldinger technique, access was gained to the right common femoral artery and an 8 French sheath was placed. Under fluoroscopy, an 8 [place] Walrus balloon guide catheter was navigated over a 6 [place] VTK catheter and a 0.035" Terumo Glidewire into the aortic arch. The catheter was placed into the left common carotid artery and then advanced into the left internal carotid artery. The inner catheter was removed. Frontal and lateral angiograms of the head were obtained. FINDINGS: Occlusion of the left middle cerebral artery at its origin. Fair collateral flow is seen from the left ACA to the left MCA vascular tree. PROCEDURE: Under biplane roadmap, a zoom 71 aspiration catheter was navigated over a phenom 21 microcatheter and a synchro support microguidewire into the cavernous segment of the right ICA. The microcatheter  was then navigated over the wire into the left M2/MCA inferior division branch. Then, a 5 x 37 mm embotrap stent retriever was deployed spanning the M1 and M2 segments. The device was allowed to intercalated with the clot for 4 minutes. The microcatheter was removed. The aspiration catheter was advanced to the level of occlusion and connected to a penumbra aspiration pump. The guiding catheter balloon was inflated. The thrombectomy device and aspiration catheter were removed under constant aspiration. Follow-up left ICA angiogram showed recanalization of most of the M1 segment with occlusion at the level of the bifurcation. Under biplane roadmap, a zoom 71 aspiration catheter was navigated over a phenom 21 microcatheter and a synchro support microguidewire into the cavernous segment of the right ICA. The microcatheter was then navigated over the wire into the left M2/MCA superior division branch. Then, a 5 x 37 mm embotrap stent retriever was deployed spanning the M1 and M2 segments. The device was allowed to intercalated with the clot for 4 minutes. The microcatheter was removed. The aspiration catheter was advanced to the level of occlusion and connected to a penumbra aspiration pump. The guiding catheter balloon was inflated. The thrombectomy device and aspiration catheter were removed under constant aspiration. Follow-up left ICA angiogram showed recanalization of the M1 segment and superior division with persistent occlusion of the inferior division. Under biplane roadmap, a zoom 71 aspiration catheter was navigated over a phenom 21 microcatheter and a synchro support microguidewire into the cavernous segment of the right ICA. The microcatheter was then navigated over the wire into the left M2/MCA inferior division branch. Then, a 5 x 37 mm embotrap stent retriever was deployed spanning the M1 and M2 segments. The device was allowed to intercalated with the clot for 4 minutes. The microcatheter was removed. The  aspiration catheter was advanced to the  level of occlusion and connected to a penumbra aspiration pump. The guiding catheter balloon was inflated. The thrombectomy device and aspiration catheter were removed under constant aspiration. Follow-up left ICA angiogram showed complete recanalization of the left MCA vascular tree (TICI3). Flat panel CT of the head was obtained and post processed in a separate workstation with concurrent attending physician supervision. Selected images were sent to PACS. Contrast staining in the left basal ganglia was noted. Small subarachnoid hemorrhage versus contrast extravasation in the left sylvian fissure. Delayed follow-up angiogram showed persistent patency of the left MCA vascular tree with no evidence of embolus to new or distal territory. The catheter was subsequently withdrawn. Right femoral artery angiograms were obtained with right anterior oblique and lateral views. The puncture is at the level of the common femoral artery. The femoral sheath was exchanged over the wire for a Perclose ProGlide which was utilized for access closure. Immediate hemostasis was achieved. IMPRESSION: 1. Successful mechanical thrombectomy for treatment of a left M1/MCA proximal occlusion. Three passes performed with combined technique (aspiration left stent retriever) with complete recanalization. 2. No embolus to new or distal territory. 3. Small left sylvian subarachnoid hemorrhage versus process extravasation. PLAN: - Transfer to ICU- Maintain intubated as no covid-19 test result available- SBP 120-140 mmHg- Bed rest post femoral access x6 hours Electronically Signed   By: Pedro Earls M.D.   On: 09/04/2019 13:09   CT CEREBRAL PERFUSION W CONTRAST  Result Date: 09/02/2019 CLINICAL DATA:  Code stroke EXAM: CT HEAD WITHOUT CONTRAST CT ANGIOGRAPHY HEAD AND NECK CT PERFUSION BRAIN TECHNIQUE: Multidetector CT imaging of the head and neck was performed using the standard protocol  during bolus administration of intravenous contrast. Multiplanar CT image reconstructions and MIPs were obtained to evaluate the vascular anatomy. Carotid stenosis measurements (when applicable) are obtained utilizing NASCET criteria, using the distal internal carotid diameter as the denominator. Multiphase CT imaging of the brain was performed following IV bolus contrast injection. Subsequent parametric perfusion maps were calculated using RAPID software. CONTRAST:  128mL OMNIPAQUE IOHEXOL 350 MG/ML SOLN COMPARISON:  2020 head CT FINDINGS: Brain: There is no acute intracranial hemorrhage. Suspected new hypoattenuation of the body of caudate nucleus and posterior lentiform nucleus on the left. Prominence of the ventricles and sulci reflects generalized parenchymal volume. Confluent areas of hypoattenuation in the supratentorial white matter nonspecific but probably reflect advanced chronic microvascular ischemic changes. There is no extra-axial fluid collection. Vascular: Hyperdensity of the left MCA. Skull: Small right burr holes. Sinuses/Orbits: No acute finding. Other: Mastoid air cells are clear. ASPECTS ([place] Stroke Program Early CT Score) - Ganglionic level infarction (caudate, lentiform nuclei, internal capsule, insula, M1-M3 cortex): 5 - Supraganglionic infarction (M4-M6 cortex): 3 Total score (0-10 with 10 being normal): 8 CTA NECK FINDINGS Aortic arch: Calcified and noncalcified plaque along the arch and patent great vessel origins. Right carotid system: Patent. Mild calcified plaque at the ICA origin causing minimal stenosis. Left carotid system: Patent. Trace calcified plaque at the ICA origin without measurable stenosis. Vertebral arteries: Patent. Left vertebral artery is slightly dominant. Minor calcified plaque is present. Skeleton: Multilevel degenerative changes of the cervical spine. Other neck: No mass or adenopathy. Upper chest: No apical lung mass. Review of the MIP images confirms the above  findings CTA HEAD FINDINGS Anterior circulation: Intracranial internal carotid arteries are patent with trace calcified plaque. There is nearly occlusive thrombus within the proximal left M1 MCA with faint contrast enhancement more distally extending into a M2 branch. Diminished  flow within distal left MCA branches. Right middle cerebral and both anterior cerebral arteries are patent. Posterior circulation: Intracranial vertebral, basilar, and posterior cerebral arteries are patent. There are bilateral posterior communicating arteries present. Venous sinuses: As permitted by contrast timing, patent. Review of the MIP images confirms the above findings CT Brain Perfusion Findings: CBF (<30%) Volume: 43mL Perfusion (Tmax>6.0s) volume: 8mL Mismatch Volume: 6mL Infarction Location: Left basal ganglia corresponding to noncontrast CT findings. Area of penumbra is within the left MCA territory. IMPRESSION: No acute intracranial hemorrhage. 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 ASPECT score of 8. There is nearly occlusive thrombus within the proximal left M1 MCA extending into an M2 branch. Perfusion imaging demonstrates core infarction of 4 mL (corresponds to noncontrast CT finding) and penumbra of 83 mL in the left MCA territory. Initial results were called by telephone at the time of interpretation on 09/02/2019 at 11:43 am to provider Dr. Curly Shores, who verbally acknowledged these results. CTA/CTP results were communicated at 11:55 amon 8/14/2021by text page via the [facility] messaging system. Electronically Signed   By: Macy Mis M.D.   On: 09/02/2019 12:05   DG CHEST PORT 1 VIEW  Result Date: 09/06/2019 CLINICAL DATA:  Respiratory failure.  Lethargy. EXAM: PORTABLE CHEST 1 VIEW COMPARISON:  01/14/2008 FINDINGS: The heart is within normal limits in size given the AP projection and portable technique. Moderate tortuosity and calcification of the thoracic aorta. Pulmonary hila are slightly  prominent but probably normal for age and degree of inspiration. Low lung volumes with vascular crowding and streaky atelectasis. No infiltrates, edema, effusions or pneumothorax. The bony thorax is intact. IMPRESSION: 1. Low lung volumes with vascular crowding and streaky atelectasis. 2. No infiltrates, edema or effusions. Electronically Signed   By: Marijo Sanes M.D.   On: 09/06/2019 13:25   DG Abd Portable 1V  Result Date: 09/03/2019 CLINICAL DATA:  Evaluate enteric tube. EXAM: PORTABLE ABDOMEN - 1 VIEW COMPARISON:  None. FINDINGS: Enteric tube tip and side-port project over the stomach. Unremarkable bowel gas pattern. Lumbar spinal hardware. IMPRESSION: Enteric tube tip and side-port project over the stomach. Electronically Signed   By: Lovey Newcomer M.D.   On: 09/03/2019 13:26   DG Swallowing Func-Speech Pathology  Result Date: 09/11/2019 Objective Swallowing Evaluation: Type of Study: MBS-Modified Barium Swallow Study  Patient Details Name: See MARAYAH HIGDON MRN: [id number] Date of Birth: November 14, 1932 Today's Date: 09/11/2019 Past Medical History: Past Medical History: Diagnosis Date . Arthritis  . Atrial fibrillation ([place])  . Cataract   BILATERAL . DDD (degenerative disc disease), cervical  . Depression  . Diverticulosis  . Falls  . GERD (gastroesophageal reflux disease)  . Glaucoma  . Headache  . Hyperlipidemia  . Hyperlipidemia  . Hypertension  . Osteoporosis  . Peripheral neuropathy  . Spondylosis   lumbar . Subdural hematoma [facility])  Past Surgical History: Past Surgical History: Procedure Laterality Date . APPENDECTOMY   . BACK SURGERY  2010 . BURR HOLE FOR SUBDURAL HEMATOMA    x 2 hematoma . COLONOSCOPY   . IR CT HEAD LTD  09/04/2019 . IR PERCUTANEOUS ART THROMBECTOMY/INFUSION INTRACRANIAL INC DIAG ANGIO  09/02/2019    . IR PERCUTANEOUS ART THROMBECTOMY/INFUSION INTRACRANIAL INC DIAG ANGIO  09/04/2019 . KNEE SURGERY Bilateral  . RADIOLOGY WITH ANESTHESIA N/A 09/02/2019  Procedure: IR WITH ANESTHESIA;  Surgeon:  Radiologist, Medication, MD;  Location: [place];  Service: Radiology;  Laterality: N/A; . VAGINAL HYSTERECTOMY   HPI: 86 year old female with past medical history of  A. fib not on anticoagulation due to frequent falling and prior subdural hematoma, hypertension, memory loss, GERD who was admitted to the hospital for altered mental status, found to have a left MCA stroke and is now status post thrombectomy. Extubated 8/16.  Assessment / Plan / Recommendation CHL IP CLINICAL IMPRESSIONS 09/11/2019 Clinical Impression Pt presents with mild-mod oropharyngeal dysphagia s/p acute CVA and deconditioning. Pt's oral phase was most remarkable for overall weak lingual manipulation of boluses, decreased bolus cohesion, and prolonged mastication of solids. Thin barium trials were administered via cup and straw. Left and right anterior loss of thin boluses noted with consumption from cup. Pt's anterior laryngeal mobility is limited and incomplete laryngeal vestibule closure noted; pt's epiglottis does not achieve full inversion across consistencies, and suspect this is exacerbated by present of Cortrak NG tube. As a result, thin barium was both penetrated on some occasions and aspirated on others, with a delayed cough response, which was ineffective to clear aspirates from the trachea (PAS score 7). Nectar barium was deglutated with only one instance of penetration into the laryngeal vestibule, which remained above the vocal folds and appeared to be ejected with a subsequent reflexive swallow triggered by patient (PAS score 2). Pureed solids were consumed with timely and effective oral transit and clearance, and no airway intrusion noted. Pt also demonstrated ability to follow verbal commands to perform an extra dry swallow throughout intake, to assist with partial clearance of mild vallecular residue of solids and liquids. Would recommend pt initiate a puree texture solid diet, nectar thick liquids (straws okay and should be used  to minimize anterior loss), medications crushed in puree, cues for following solids with liquids and intermittent extra dry swallow. Please provide full supervision during meals to ensure safe positioning and use of swallow precautions/strategies. SLP Visit Diagnosis Dysphagia, oropharyngeal phase (R13.12) Attention and concentration deficit following -- Frontal lobe and executive function deficit following -- Impact on safety and function Mild aspiration risk   CHL IP TREATMENT RECOMMENDATION 09/04/2019 Treatment Recommendations Therapy as outlined in treatment plan below   Prognosis 09/04/2019 Prognosis for Safe Diet Advancement Good Barriers to Reach Goals Cognitive deficits;Language deficits Barriers/Prognosis Comment -- CHL IP DIET RECOMMENDATION 09/11/2019 SLP Diet Recommendations Dysphagia 1 (Puree) solids;Nectar thick liquid Liquid Administration via -- Medication Administration Crushed with puree Compensations Minimize environmental distractions;Slow rate;Small sips/bites;Monitor for anterior loss;Multiple dry swallows after each bite/sip;Follow solids with liquid Postural Changes Remain semi-upright after after feeds/meals (Comment);Seated upright at 90 degrees   CHL IP OTHER RECOMMENDATIONS 09/11/2019 Recommended Consults -- Oral Care Recommendations Oral care BID Other Recommendations Have oral suction available;Order thickener from pharmacy;Prohibited food (jello, ice cream, thin soups);Remove water pitcher   CHL IP FOLLOW UP RECOMMENDATIONS 09/07/2019 Follow up Recommendations Inpatient Rehab   CHL IP FREQUENCY AND DURATION 09/04/2019 Speech Therapy Frequency (ACUTE ONLY) min 2x/week Treatment Duration --      CHL IP ORAL PHASE 09/11/2019 Oral Phase Impaired Oral - Pudding Teaspoon -- Oral - Pudding Cup -- Oral - Honey Teaspoon -- Oral - Honey Cup -- Oral - Nectar Teaspoon -- Oral - Nectar Cup -- Oral - Nectar Straw Piecemeal swallowing;Decreased bolus cohesion;Weak lingual manipulation;Delayed oral transit  Oral - Thin Teaspoon -- Oral - Thin Cup Delayed oral transit;Piecemeal swallowing;Decreased bolus cohesion;Right anterior bolus loss;Left anterior bolus loss;Weak lingual manipulation Oral - Thin Straw Piecemeal swallowing;Decreased bolus cohesion;Weak lingual manipulation;Delayed oral transit Oral - Puree Weak lingual manipulation;Piecemeal swallowing Oral - Mech Soft Delayed oral transit;Impaired mastication;Weak lingual manipulation;Reduced posterior propulsion;Decreased bolus  cohesion;Lingual/palatal residue;Piecemeal swallowing Oral - Regular -- Oral - Multi-Consistency -- Oral - Pill -- Oral Phase - Comment --  CHL IP PHARYNGEAL PHASE 09/11/2019 Pharyngeal Phase Impaired Pharyngeal- Pudding Teaspoon -- Pharyngeal -- Pharyngeal- Pudding Cup -- Pharyngeal -- Pharyngeal- Honey Teaspoon -- Pharyngeal -- Pharyngeal- Honey Cup -- Pharyngeal -- Pharyngeal- Nectar Teaspoon -- Pharyngeal -- Pharyngeal- Nectar Cup -- Pharyngeal -- Pharyngeal- Nectar Straw Reduced epiglottic inversion;Reduced anterior laryngeal mobility;Reduced airway/laryngeal closure;Penetration/Aspiration during swallow;Pharyngeal residue - valleculae;Compensatory strategies attempted (with notebox) Pharyngeal Material enters airway, remains ABOVE vocal cords then ejected out Pharyngeal- Thin Teaspoon -- Pharyngeal -- Pharyngeal- Thin Cup Reduced epiglottic inversion;Reduced anterior laryngeal mobility;Reduced airway/laryngeal closure;Pharyngeal residue - valleculae;Penetration/Aspiration during swallow;Penetration/Apiration after swallow Pharyngeal Material enters airway, CONTACTS cords and not ejected out;Material enters airway, passes BELOW cords and not ejected out despite cough attempt by patient Pharyngeal- Thin Straw Reduced epiglottic inversion;Reduced airway/laryngeal closure;Reduced anterior laryngeal mobility;Penetration/Aspiration during swallow;Penetration/Apiration after swallow;Pharyngeal residue - valleculae Pharyngeal Material enters  airway, passes BELOW cords and not ejected out despite cough attempt by patient;Material enters airway, CONTACTS cords and not ejected out Pharyngeal- Puree Reduced epiglottic inversion;Reduced airway/laryngeal closure;Pharyngeal residue - valleculae;Reduced anterior laryngeal mobility Pharyngeal -- Pharyngeal- Mechanical Soft Reduced anterior laryngeal mobility;Reduced epiglottic inversion;Pharyngeal residue - valleculae;Reduced airway/laryngeal closure Pharyngeal -- Pharyngeal- Regular -- Pharyngeal -- Pharyngeal- Multi-consistency -- Pharyngeal -- Pharyngeal- Pill -- Pharyngeal -- Pharyngeal Comment --  CHL IP CERVICAL ESOPHAGEAL PHASE 09/11/2019 Cervical Esophageal Phase WFL Pudding Teaspoon -- Pudding Cup -- Honey Teaspoon -- Honey Cup -- Nectar Teaspoon -- Nectar Cup -- Nectar Straw -- Thin Teaspoon -- Thin Cup -- Thin Straw -- Puree -- Mechanical Soft -- Regular -- Multi-consistency -- Pill -- Cervical Esophageal Comment -- Arbutus Leas 09/11/2019, 9:53 AM              EEG adult  Result Date: 09/06/2019 Lora Havens, MD     09/06/2019  3:46 PM Patient Name: [name] MRN: [id number] Epilepsy Attending: Lora Havens Referring Physician/Provider: Burnetta Sabin, NP Date: 09/06/2019 Duration: 21.30 mins Patient history: 86 year old female who presented with speech disturbance, left-sided gaze deviation and nonrhythmic shaking of left side.  CT showed left MCA acute infarct.  EEG to assess for seizures. Level of alertness: Awake AEDs during EEG study: None Technical aspects: This EEG study was done with scalp electrodes positioned according to the 10-20 International system of electrode placement. Electrical activity was acquired at a sampling rate of 500Hz  and reviewed with a high frequency filter of 70Hz  and a low frequency filter of 1Hz . EEG data were recorded continuously and digitally stored. Description: The posterior dominant rhythm consists of 8 Hz activity of moderate voltage (25-35 uV) seen  predominantly in posterior head regions, symmetric and reactive to eye opening and eye closing. EEG showed continuous generalized and lateralized left hemisphere 3 to 6 Hz theta-delta slowing with overriding 13-15hz  generalized beta activity. Hyperventilation and photic stimulation were not performed.   ABNORMALITY -Continuous slow, generalized and lateralized left hemisphere IMPRESSION: This study is suggestive of cortical dysfunction in left hemisphere likely secondary to underlying infarct as well as mild to moderate diffuse encephalopathy, nonspecific etiology. No seizures or definite epileptiform discharges were seen throughout the recording. Lora Havens   ECHOCARDIOGRAM COMPLETE  Result Date: 09/03/2019    ECHOCARDIOGRAM REPORT   Patient Name:   JALIYA SIEGMANN Date of Exam: 09/03/2019 Medical Rec #:  [id number]      Height:       65.0 in Accession #:  [id number]     Weight:       185.8 lb Date of Birth:  16-Jun-1932     BSA:          1.917 m Patient Age:    74 years       BP:           142/84 mmHg Patient Gender: F              HR:           79 bpm. Exam Location:  Inpatient Procedure: 2D Echo Indications:    434.91 stroke  History:        Patient has prior history of Echocardiogram examinations.                 Arrythmias:Atrial Fibrillation; Risk Factors:Hypertension,                 Dyslipidemia and Former Smoker.  Sonographer:    Jannett Celestine RDCS (AE) Referring Phys: [id number] [place]  1. Left ventricular ejection fraction, by estimation, is 60 to 65%. The left ventricle has normal function. The left ventricle has no regional wall motion abnormalities. There is mild concentric left ventricular hypertrophy. Left ventricular diastolic function could not be evaluated.  2. Right ventricular systolic function is normal. The right ventricular size is moderately enlarged.  3. The mitral valve is degenerative. Trivial mitral valve regurgitation. No evidence of mitral stenosis.  4. The  aortic valve is tricuspid. Aortic valve regurgitation is not visualized. Mild aortic valve stenosis. Conclusion(s)/Recommendation(s): Patient with stroke and atrial fibrillation. No intracardiac source of embolism identified other than atrial fibrilliation rhythm. FINDINGS  Left Ventricle: Left ventricular ejection fraction, by estimation, is 60 to 65%. The left ventricle has normal function. The left ventricle has no regional wall motion abnormalities. The left ventricular internal cavity size was normal in size. There is  mild concentric left ventricular hypertrophy. Left ventricular diastolic function could not be evaluated due to atrial fibrillation. Left ventricular diastolic function could not be evaluated. Right Ventricle: The right ventricular size is moderately enlarged. No increase in right ventricular wall thickness. Right ventricular systolic function is normal. Left Atrium: Left atrial size was normal in size. Right Atrium: Right atrial size was normal in size. Pericardium: Trivial pericardial effusion is present. Presence of pericardial fat pad. Mitral Valve: The mitral valve is degenerative in appearance. Mild mitral annular calcification. Trivial mitral valve regurgitation. No evidence of mitral valve stenosis. Tricuspid Valve: The tricuspid valve is grossly normal. Tricuspid valve regurgitation is mild . No evidence of tricuspid stenosis. Aortic Valve: The aortic valve is tricuspid. . There is moderate thickening and moderate calcification of the aortic valve. Aortic valve regurgitation is not visualized. Mild aortic stenosis is present. There is moderate thickening of the aortic valve. There is moderate calcification of the aortic valve. Aortic valve mean gradient measures 9.0 mmHg. Aortic valve peak gradient measures 14.0 mmHg. Aortic valve area, by VTI measures 1.93 cm. Pulmonic Valve: The pulmonic valve was grossly normal. Pulmonic valve regurgitation is trivial. No evidence of pulmonic  stenosis. Aorta: The aortic root and ascending aorta are structurally normal, with no evidence of dilitation. Venous: IVC assessment for right atrial pressure unable to be performed due to mechanical ventilation. IAS/Shunts: The atrial septum is grossly normal.  LEFT VENTRICLE PLAX 2D LVIDd:         3.70 cm LVIDs:         2.40 cm LV PW:  1.00 cm LV IVS:        1.30 cm LVOT diam:     2.20 cm LV SV:         66 LV SV Index:   34 LVOT Area:     3.80 cm  RIGHT VENTRICLE RV S prime:     13.40 cm/s TAPSE (M-mode): 1.9 cm LEFT ATRIUM             Index       RIGHT ATRIUM           Index LA diam:        3.60 cm 1.88 cm/m  RA Area:     16.20 cm LA Vol (A2C):   58.0 ml 30.25 ml/m RA Volume:   39.70 ml  20.71 ml/m LA Vol (A4C):   35.2 ml 18.36 ml/m LA Biplane Vol: 45.3 ml 23.63 ml/m  AORTIC VALVE AV Area (Vmax):    2.07 cm AV Area (Vmean):   1.70 cm AV Area (VTI):     1.93 cm AV Vmax:           187.00 cm/s AV Vmean:          142.000 cm/s AV VTI:            0.343 m AV Peak Grad:      14.0 mmHg AV Mean Grad:      9.0 mmHg LVOT Vmax:         102.00 cm/s LVOT Vmean:        63.600 cm/s LVOT VTI:          0.174 m LVOT/AV VTI ratio: 0.51  AORTA Ao Root diam: 3.20 cm MITRAL VALVE                TRICUSPID VALVE MV Area (PHT): 4.39 cm     TR Peak grad:   35.0 mmHg MV Decel Time: 173 msec     TR Vmax:        296.00 cm/s MV E velocity: 118.00 cm/s                             SHUNTS                             Systemic VTI:  0.17 m                             Systemic Diam: 2.20 cm Eleonore Chiquito MD Electronically signed by Eleonore Chiquito MD Signature Date/Time: 09/03/2019/11:14:08 AM    Final    IR PERCUTANEOUS ART THROMBECTOMY/INFUSION INTRACRANIAL INC DIAG ANGIO  Result Date: 09/04/2019 INDICATION: 86 year old female with past medical history significant for atrial fibrillation (not on anticoagulation due to prior subdural hematoma), hypertension, hyperlipidemia, heart disease, mild cognitive impairment of vascular  etiology complicated by alcohol use with a premorbid Rankin scale of 0. She presented to ED with aphasia, left gaze deviation and nonrhythmic shaking of the left side, NIHSS 22. She was last known well at 2:30 p.m. on 09/01/2019. No IV tPA administered as patient was outside of the window. Head CT showed prominent white matter disease and atrophy with hyperdense left MCA without large acute infarct or hemorrhage. CT angiogram of the head and neck showed a proximal left M1/MCA occlusion extending into the M2 segment. CT perfusion showed a core infarct of 4 mL  with a penumbra of 83 mL. She was then transferred to our service for a diagnostic cerebral angiogram and emergency mechanical thrombectomy. EXAM: DIAGNOSTIC CEREBRAL ANGIOGRAM MECHANICAL THROMBECTOMY FLAT PANEL HEAD CT COMPARISON:  CT/CT angiogram of the head and neck September 02, 2019 MEDICATIONS: No antibiotics given. ANESTHESIA/SEDATION: The procedure was performed in the general anesthesia. FLUOROSCOPY TIME:  Fluoroscopy Time: 31 minutes 12 seconds (678 mGy). COMPLICATIONS: None immediate. TECHNIQUE: Informed written consent was obtained from the patient's daughter after a thorough discussion of the procedural risks, benefits and alternatives. All questions were addressed. Maximal Sterile Barrier Technique was utilized including caps, mask, sterile gowns, sterile gloves, sterile drape, hand hygiene and skin antiseptic. A timeout was performed prior to the initiation of the procedure. The right groin was prepped and draped in the usual sterile fashion. Using a micropuncture kit and the modified Seldinger technique, access was gained to the right common femoral artery and an 8 French sheath was placed. Under fluoroscopy, an 8 [place] Walrus balloon guide catheter was navigated over a 6 [place] VTK catheter and a 0.035" Terumo Glidewire into the aortic arch. The catheter was placed into the left common carotid artery and then advanced into the left internal  carotid artery. The inner catheter was removed. Frontal and lateral angiograms of the head were obtained. FINDINGS: Occlusion of the left middle cerebral artery at its origin. Fair collateral flow is seen from the left ACA to the left MCA vascular tree. PROCEDURE: Under biplane roadmap, a zoom 71 aspiration catheter was navigated over a phenom 21 microcatheter and a synchro support microguidewire into the cavernous segment of the right ICA. The microcatheter was then navigated over the wire into the left M2/MCA inferior division branch. Then, a 5 x 37 mm embotrap stent retriever was deployed spanning the M1 and M2 segments. The device was allowed to intercalated with the clot for 4 minutes. The microcatheter was removed. The aspiration catheter was advanced to the level of occlusion and connected to a penumbra aspiration pump. The guiding catheter balloon was inflated. The thrombectomy device and aspiration catheter were removed under constant aspiration. Follow-up left ICA angiogram showed recanalization of most of the M1 segment with occlusion at the level of the bifurcation. Under biplane roadmap, a zoom 71 aspiration catheter was navigated over a phenom 21 microcatheter and a synchro support microguidewire into the cavernous segment of the right ICA. The microcatheter was then navigated over the wire into the left M2/MCA superior division branch. Then, a 5 x 37 mm embotrap stent retriever was deployed spanning the M1 and M2 segments. The device was allowed to intercalated with the clot for 4 minutes. The microcatheter was removed. The aspiration catheter was advanced to the level of occlusion and connected to a penumbra aspiration pump. The guiding catheter balloon was inflated. The thrombectomy device and aspiration catheter were removed under constant aspiration. Follow-up left ICA angiogram showed recanalization of the M1 segment and superior division with persistent occlusion of the inferior division. Under  biplane roadmap, a zoom 71 aspiration catheter was navigated over a phenom 21 microcatheter and a synchro support microguidewire into the cavernous segment of the right ICA. The microcatheter was then navigated over the wire into the left M2/MCA inferior division branch. Then, a 5 x 37 mm embotrap stent retriever was deployed spanning the M1 and M2 segments. The device was allowed to intercalated with the clot for 4 minutes. The microcatheter was removed. The aspiration catheter was advanced to the level of occlusion and connected  to a penumbra aspiration pump. The guiding catheter balloon was inflated. The thrombectomy device and aspiration catheter were removed under constant aspiration. Follow-up left ICA angiogram showed complete recanalization of the left MCA vascular tree (TICI3). Flat panel CT of the head was obtained and post processed in a separate workstation with concurrent attending physician supervision. Selected images were sent to PACS. Contrast staining in the left basal ganglia was noted. Small subarachnoid hemorrhage versus contrast extravasation in the left sylvian fissure. Delayed follow-up angiogram showed persistent patency of the left MCA vascular tree with no evidence of embolus to new or distal territory. The catheter was subsequently withdrawn. Right femoral artery angiograms were obtained with right anterior oblique and lateral views. The puncture is at the level of the common femoral artery. The femoral sheath was exchanged over the wire for a Perclose ProGlide which was utilized for access closure. Immediate hemostasis was achieved. IMPRESSION: 1. Successful mechanical thrombectomy for treatment of a left M1/MCA proximal occlusion. Three passes performed with combined technique (aspiration left stent retriever) with complete recanalization. 2. No embolus to new or distal territory. 3. Small left sylvian subarachnoid hemorrhage versus process extravasation. PLAN: - Transfer to ICU-  Maintain intubated as no covid-19 test result available- SBP 120-140 mmHg- Bed rest post femoral access x6 hours Electronically Signed   By: Pedro Earls M.D.   On: 09/04/2019 13:09   CT HEAD CODE STROKE WO CONTRAST  Result Date: 09/02/2019 CLINICAL DATA:  Code stroke EXAM: CT HEAD WITHOUT CONTRAST CT ANGIOGRAPHY HEAD AND NECK CT PERFUSION BRAIN TECHNIQUE: Multidetector CT imaging of the head and neck was performed using the standard protocol during bolus administration of intravenous contrast. Multiplanar CT image reconstructions and MIPs were obtained to evaluate the vascular anatomy. Carotid stenosis measurements (when applicable) are obtained utilizing NASCET criteria, using the distal internal carotid diameter as the denominator. Multiphase CT imaging of the brain was performed following IV bolus contrast injection. Subsequent parametric perfusion maps were calculated using RAPID software. CONTRAST:  159mL OMNIPAQUE IOHEXOL 350 MG/ML SOLN COMPARISON:  2020 head CT FINDINGS: Brain: There is no acute intracranial hemorrhage. Suspected new hypoattenuation of the body of caudate nucleus and posterior lentiform nucleus on the left. Prominence of the ventricles and sulci reflects generalized parenchymal volume. Confluent areas of hypoattenuation in the supratentorial white matter nonspecific but probably reflect advanced chronic microvascular ischemic changes. There is no extra-axial fluid collection. Vascular: Hyperdensity of the left MCA. Skull: Small right burr holes. Sinuses/Orbits: No acute finding. Other: Mastoid air cells are clear. ASPECTS ([place] Stroke Program Early CT Score) - Ganglionic level infarction (caudate, lentiform nuclei, internal capsule, insula, M1-M3 cortex): 5 - Supraganglionic infarction (M4-M6 cortex): 3 Total score (0-10 with 10 being normal): 8 CTA NECK FINDINGS Aortic arch: Calcified and noncalcified plaque along the arch and patent great vessel origins. Right  carotid system: Patent. Mild calcified plaque at the ICA origin causing minimal stenosis. Left carotid system: Patent. Trace calcified plaque at the ICA origin without measurable stenosis. Vertebral arteries: Patent. Left vertebral artery is slightly dominant. Minor calcified plaque is present. Skeleton: Multilevel degenerative changes of the cervical spine. Other neck: No mass or adenopathy. Upper chest: No apical lung mass. Review of the MIP images confirms the above findings CTA HEAD FINDINGS Anterior circulation: Intracranial internal carotid arteries are patent with trace calcified plaque. There is nearly occlusive thrombus within the proximal left M1 MCA with faint contrast enhancement more distally extending into a M2 branch. Diminished flow within distal left  MCA branches. Right middle cerebral and both anterior cerebral arteries are patent. Posterior circulation: Intracranial vertebral, basilar, and posterior cerebral arteries are patent. There are bilateral posterior communicating arteries present. Venous sinuses: As permitted by contrast timing, patent. Review of the MIP images confirms the above findings CT Brain Perfusion Findings: CBF (<30%) Volume: 24mL Perfusion (Tmax>6.0s) volume: 77mL Mismatch Volume: 23mL Infarction Location: Left basal ganglia corresponding to noncontrast CT findings. Area of penumbra is within the left MCA territory. IMPRESSION: No acute intracranial hemorrhage. 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 ASPECT score of 8. There is nearly occlusive thrombus within the proximal left M1 MCA extending into an M2 branch. Perfusion imaging demonstrates core infarction of 4 mL (corresponds to noncontrast CT finding) and penumbra of 83 mL in the left MCA territory. Initial results were called by telephone at the time of interpretation on 09/02/2019 at 11:43 am to provider Dr. Curly Shores, who verbally acknowledged these results. CTA/CTP results were communicated at 11:55  amon 8/14/2021by text page via the [facility] messaging system. Electronically Signed   By: Macy Mis M.D.   On: 09/02/2019 12:05   CT ANGIO HEAD CODE STROKE  Result Date: 09/02/2019 CLINICAL DATA:  Code stroke EXAM: CT HEAD WITHOUT CONTRAST CT ANGIOGRAPHY HEAD AND NECK CT PERFUSION BRAIN TECHNIQUE: Multidetector CT imaging of the head and neck was performed using the standard protocol during bolus administration of intravenous contrast. Multiplanar CT image reconstructions and MIPs were obtained to evaluate the vascular anatomy. Carotid stenosis measurements (when applicable) are obtained utilizing NASCET criteria, using the distal internal carotid diameter as the denominator. Multiphase CT imaging of the brain was performed following IV bolus contrast injection. Subsequent parametric perfusion maps were calculated using RAPID software. CONTRAST:  127mL OMNIPAQUE IOHEXOL 350 MG/ML SOLN COMPARISON:  2020 head CT FINDINGS: Brain: There is no acute intracranial hemorrhage. Suspected new hypoattenuation of the body of caudate nucleus and posterior lentiform nucleus on the left. Prominence of the ventricles and sulci reflects generalized parenchymal volume. Confluent areas of hypoattenuation in the supratentorial white matter nonspecific but probably reflect advanced chronic microvascular ischemic changes. There is no extra-axial fluid collection. Vascular: Hyperdensity of the left MCA. Skull: Small right burr holes. Sinuses/Orbits: No acute finding. Other: Mastoid air cells are clear. ASPECTS ([place] Stroke Program Early CT Score) - Ganglionic level infarction (caudate, lentiform nuclei, internal capsule, insula, M1-M3 cortex): 5 - Supraganglionic infarction (M4-M6 cortex): 3 Total score (0-10 with 10 being normal): 8 CTA NECK FINDINGS Aortic arch: Calcified and noncalcified plaque along the arch and patent great vessel origins. Right carotid system: Patent. Mild calcified plaque at the ICA origin causing minimal  stenosis. Left carotid system: Patent. Trace calcified plaque at the ICA origin without measurable stenosis. Vertebral arteries: Patent. Left vertebral artery is slightly dominant. Minor calcified plaque is present. Skeleton: Multilevel degenerative changes of the cervical spine. Other neck: No mass or adenopathy. Upper chest: No apical lung mass. Review of the MIP images confirms the above findings CTA HEAD FINDINGS Anterior circulation: Intracranial internal carotid arteries are patent with trace calcified plaque. There is nearly occlusive thrombus within the proximal left M1 MCA with faint contrast enhancement more distally extending into a M2 branch. Diminished flow within distal left MCA branches. Right middle cerebral and both anterior cerebral arteries are patent. Posterior circulation: Intracranial vertebral, basilar, and posterior cerebral arteries are patent. There are bilateral posterior communicating arteries present. Venous sinuses: As permitted by contrast timing, patent. Review of the MIP images confirms the above  findings CT Brain Perfusion Findings: CBF (<30%) Volume: 79mL Perfusion (Tmax>6.0s) volume: 5mL Mismatch Volume: 92mL Infarction Location: Left basal ganglia corresponding to noncontrast CT findings. Area of penumbra is within the left MCA territory. IMPRESSION: No acute intracranial hemorrhage. 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 ASPECT score of 8. There is nearly occlusive thrombus within the proximal left M1 MCA extending into an M2 branch. Perfusion imaging demonstrates core infarction of 4 mL (corresponds to noncontrast CT finding) and penumbra of 83 mL in the left MCA territory. Initial results were called by telephone at the time of interpretation on 09/02/2019 at 11:43 am to provider Dr. Iver Nestle, who verbally acknowledged these results. CTA/CTP results were communicated at 11:55 amon 8/14/2021by text page via the [facility] messaging system. Electronically  Signed   By: Guadlupe Spanish M.D.   On: 09/02/2019 12:05   CT ANGIO NECK CODE STROKE  Result Date: 09/02/2019 CLINICAL DATA:  Code stroke EXAM: CT HEAD WITHOUT CONTRAST CT ANGIOGRAPHY HEAD AND NECK CT PERFUSION BRAIN TECHNIQUE: Multidetector CT imaging of the head and neck was performed using the standard protocol during bolus administration of intravenous contrast. Multiplanar CT image reconstructions and MIPs were obtained to evaluate the vascular anatomy. Carotid stenosis measurements (when applicable) are obtained utilizing NASCET criteria, using the distal internal carotid diameter as the denominator. Multiphase CT imaging of the brain was performed following IV bolus contrast injection. Subsequent parametric perfusion maps were calculated using RAPID software. CONTRAST:  OMNIPAQUE IOHEXOL 350 MG/ML SOLN COMPARISON:  2020 head CT FINDINGS: Brain: There is no acute intracranial hemorrhage. Suspected new hypoattenuation of the body of caudate nucleus and posterior lentiform nucleus on the left. Prominence of the ventricles and sulci reflects generalized parenchymal volume. Confluent areas of hypoattenuation in the supratentorial white matter nonspecific but probably reflect advanced chronic microvascular ischemic changes. There is no extra-axial fluid collection. Vascular: Hyperdensity of the left MCA. Skull: Small right burr holes. Sinuses/Orbits: No acute finding. Other: Mastoid air cells are clear. ASPECTS ([place] Stroke Program Early CT Score) - Ganglionic level infarction (caudate, lentiform nuclei, internal capsule, insula, M1-M3 cortex): 5 - Supraganglionic infarction (M4-M6 cortex): 3 Total score (0-10 with 10 being normal): 8 CTA NECK FINDINGS Aortic arch: Calcified and noncalcified plaque along the arch and patent great vessel origins. Right carotid system: Patent. Mild calcified plaque at the ICA origin causing minimal stenosis. Left carotid system: Patent. Trace calcified plaque at the ICA  origin without measurable stenosis. Vertebral arteries: Patent. Left vertebral artery is slightly dominant. Minor calcified plaque is present. Skeleton: Multilevel degenerative changes of the cervical spine. Other neck: No mass or adenopathy. Upper chest: No apical lung mass. Review of the MIP images confirms the above findings CTA HEAD FINDINGS Anterior circulation: Intracranial internal carotid arteries are patent with trace calcified plaque. There is nearly occlusive thrombus within the proximal left M1 MCA with faint contrast enhancement more distally extending into a M2 branch. Diminished flow within distal left MCA branches. Right middle cerebral and both anterior cerebral arteries are patent. Posterior circulation: Intracranial vertebral, basilar, and posterior cerebral arteries are patent. There are bilateral posterior communicating arteries present. Venous sinuses: As permitted by contrast timing, patent. Review of the MIP images confirms the above findings CT Brain Perfusion Findings: CBF (<30%) Volume: 11mL Perfusion (Tmax>6.0s) volume: 66mL Mismatch Volume: 9mL Infarction Location: Left basal ganglia corresponding to noncontrast CT findings. Area of penumbra is within the left MCA territory. IMPRESSION: No acute intracranial hemorrhage. Left MCA territory acute infarction involving  the basal ganglia. ASPECT score of 8. There is nearly occlusive thrombus within the proximal left M1 MCA extending into an M2 branch. Perfusion imaging demonstrates core infarction of 4 mL (corresponds to noncontrast CT finding) and penumbra of 83 mL in the left MCA territory. Initial results were called by telephone at the time of interpretation on 09/02/2019 at 11:43 am to provider Dr. Curly Shores, who verbally acknowledged these results. CTA/CTP results were communicated at 11:55 amon 8/14/2021by text page via the [facility] messaging system. Electronically Signed   By: Macy Mis M.D.   On: 09/02/2019 12:05     Labs:  CBC: Recent Labs    09/06/19 0157 09/07/19 0215 09/08/19 0853 09/19/19 1115  WBC 8.7 8.5 10.7* 8.0  HGB 12.7 12.5 13.1 13.8  HCT 37.2 37.3 38.8 42.2  PLT 210 225 258 406*    COAGS: Recent Labs    09/02/19 1130  INR 1.0  APTT 34    BMP: Recent Labs    09/06/19 0157 09/07/19 0215 09/08/19 0853 09/13/19 0617  NA 131* 131* 132* 131*  K 3.7 4.0 4.4 4.9  CL 96* 96* 95* 95*  CO2 $Re'26 25 26 27  'tQU$ GLUCOSE 141* 142* 149* 113*  BUN $Re'19 21 21 'qlE$ 26*  CALCIUM 9.1 9.0 9.2 9.3  CREATININE 0.64 0.66 0.76 0.77  GFRNONAA >60 >60 >60 >60  GFRAA >60 >60 >60 >60    LIVER FUNCTION TESTS: Recent Labs    09/02/19 1130 09/08/19 0853  BILITOT 1.0 0.7  AST 25 20  ALT 15 23  ALKPHOS 63 72  PROT 7.2 6.2*  ALBUMIN 4.2 3.1*    TUMOR MARKERS: No results for input(s): AFPTM, CEA, CA199, CHROMGRNA in the last 8760 hours.  Assessment and Plan: Dysphagia, poor PO intake Patient s/p CVA with thrombectomy.  She has ongoing dysphagia with decreased PO intake.  She has been receiving supplement TFs via NGT, now with plans to discharge to SNF and will need gastrostomy tube placement.  IR consulted for percutaneous placement.  CT obtained and reviewed by Dr. Anselm Pancoast.  NPO after MN.  Not currently on blood thinners.   Risks and benefits image guided gastrostomy tube placement was discussed with the patient's POA, Jeanie Sewer including, but not limited to the need for a barium enema during the procedure, bleeding, infection, peritonitis and/or damage to adjacent structures.  All of the patient's questions were answered, patient is agreeable to proceed.  Consent signed and in chart.  Thank you for this interesting consult.  I greatly enjoyed meeting Melvie G Waidelich and look forward to participating in their care.  A copy of this report was sent to the requesting provider on this date.  Electronically Signed: Docia Barrier, PA 09/21/2019, 4:23 PM   I spent a total of 40  Minutes    in face to face in clinical consultation, greater than 50% of which was counseling/coordinating care for dysphagia.

## 2019-09-21 NOTE — Progress Notes (Signed)
Speech Language Pathology Weekly Progress and Session Note  Patient Details  Name: [name] MRN: [id number] Date of Birth: 07-Nov-1932  Beginning of progress report period: September 14, 2019 End of progress report period: September 21, 2019  Today's Date: 09/21/2019 SLP Individual Time: 9528-4132 SLP Individual Time Calculation (min): 45 min  Short Term Goals: Week 2: SLP Short Term Goal 1 (Week 2): Pt will consume current diet with Mod A verbal/visual cues for implementation of compensatory swallow strategies for oral clearance and minimize aspiration risk. SLP Short Term Goal 1 - Progress (Week 2): Progressing toward goal SLP Short Term Goal 2 (Week 2): Pt will demonstrate efficient mastication and oral clearance of Dys 2 solid trials a minimum of 2 times prior to diet advancement. SLP Short Term Goal 2 - Progress (Week 2): Progressing toward goal SLP Short Term Goal 3 (Week 2): Pt will communicate basic wants and needs at the word level with Mod A verbal and/or visual cues. SLP Short Term Goal 3 - Progress (Week 2): Progressing toward goal SLP Short Term Goal 4 (Week 2): Pt will name common objects with 80%  accuracy provided Mod A multimodal cues. SLP Short Term Goal 4 - Progress (Week 2): Progressing toward goal SLP Short Term Goal 5 (Week 2): Pt will match words to common objects with 60% accuracy with Max A multimodal cues. SLP Short Term Goal 5 - Progress (Week 2): Progressing toward goal SLP Short Term Goal 6 (Week 2): Pt will demonstrate awareness of functional and/or verbal errors with Max A multimodal cues. SLP Short Term Goal 6 - Progress (Week 2): Progressing toward goal    New Short Term Goals: Week 3: SLP Short Term Goal 1 (Week 3): Pt will consume current diet with Mod A verbal/visual cues for implementation of compensatory swallow strategies for oral clearance and minimize aspiration risk. SLP Short Term Goal 2 (Week 3): Pt will demonstrate efficient mastication and  oral clearance of Dys 2 solid trials a minimum of 2 times prior to diet advancement. SLP Short Term Goal 3 (Week 3): Pt will communicate basic wants and needs at the word level with Mod A verbal and/or visual cues. SLP Short Term Goal 4 (Week 3): Pt will name common objects with 80%  accuracy provided Mod A multimodal cues. SLP Short Term Goal 5 (Week 3): Pt will match words to common objects with 60% accuracy with Max A multimodal cues. SLP Short Term Goal 6 (Week 3): Pt will demonstrate awareness of functional and/or verbal errors with Max A multimodal cues.  Weekly Progress Updates: Pt has made slow but consistent progress with goals and is progressing toward meeting  5/5 STG's this reporting period due to improved tolerance of dysphagia 2 diet, improved verbal expression, and increase in overall alertness. Currently pt continues to require mod-max a A for communication when limited by lethargy. Increased verbal expression noted when more alert.  Pt/ family education is ongoing. Family has requested SLP tx in afternoon when pt is more alert.Pt would benefit from continued skilled slp intervention to maximize communication and swallow function.  Intensity: Minumum of 1-2 x/day, 30 to 90 minutes Frequency: 3 to 5 out of 7 days Duration/Length of Stay: 09/26/19-SNF placement Treatment/Interventions: Cognitive remediation/compensation;Cueing hierarchy;Dysphagia/aspiration precaution training;Functional tasks;Patient/family education;Therapeutic Activities;Internal/external aids;Environmental controls;Multimodal communication approach;Speech/Language facilitation   Daily Session  Skilled Therapeutic Interventions: Skilled slp intervention focused on dysphagia. Daughter present during session. Pt alert and smiled at SLP in response to questions/statements at beginning of session. Daughter  reports pt is more awake,alert, and verbal in afternoon and requested to have remaining slp tx during rehab stay moved  to afternoon. Pt followed slps simple commands for oral motor movements with min a visual cues.Oral care completed with oral suctioning. Pt fed by SLP with NTL by tsp and dys 2 (pudding with graham cracker crumbles. Pt accepted small 1/4 tsp amounts from spoon. Minimal mastication noted but likely due to small amount taken with each trial. Delayed coughing episode x1 (20-30 seconds after swallow with dys 2 snack. Attempted picture-word matching task however pt fell asleep and unable to remain awake to participate. Educated daughter for remaining of session on functional receptive and expressive language exercises to complete with pt. Daughter verbalized understanding. Cont therapy per goals.      General    Pain Pain Assessment Pain Scale: Faces Faces Pain Scale: No hurt  Therapy/Group: Individual Therapy  Darrol [place] Ambrosio 09/21/2019, 7:31 AM

## 2019-09-21 NOTE — Progress Notes (Signed)
Patient ID: [name], female   DOB: [birth year], 86 y.o.   MRN: [id number]   Sw has confirmed with [facility] that patient will transfer to facility on 9/7 (10AM) . Patient COVID test scheduled for Sunday 9/5

## 2019-09-21 NOTE — Plan of Care (Signed)
  Problem: Consults Goal: RH STROKE PATIENT EDUCATION Description: See Patient Education module for education specifics  Outcome: Progressing Goal: Nutrition Consult-if indicated Outcome: Progressing Goal: Diabetes Guidelines if Diabetic/Glucose > 140 Description: If diabetic or lab glucose is > 140 mg/dl - Initiate Diabetes/Hyperglycemia Guidelines & Document Interventions  Outcome: Progressing   Problem: RH BOWEL ELIMINATION Goal: RH STG MANAGE BOWEL WITH ASSISTANCE Description: STG Manage Bowel with min Assistance. Outcome: Progressing Goal: RH STG MANAGE BOWEL W/MEDICATION W/ASSISTANCE Description: STG Manage Bowel with Medication with min Assistance. Outcome: Progressing   Problem: RH BLADDER ELIMINATION Goal: RH STG MANAGE BLADDER WITH ASSISTANCE Description: STG Manage Bladder With min Assistance Outcome: Progressing Goal: RH STG MANAGE BLADDER WITH EQUIPMENT WITH ASSISTANCE Description: STG Manage Bladder With Equipment With total Assistance Outcome: Progressing   Problem: RH SKIN INTEGRITY Goal: RH STG SKIN FREE OF INFECTION/BREAKDOWN Description: With min assist  Outcome: Progressing Goal: RH STG MAINTAIN SKIN INTEGRITY WITH ASSISTANCE Description: STG Maintain Skin Integrity With  min Assistance. Outcome: Progressing Goal: RH STG ABLE TO PERFORM INCISION/WOUND CARE W/ASSISTANCE Description: STG Able To Perform Incision/Wound Care With min Assistance. Outcome: Progressing   Problem: RH KNOWLEDGE DEFICIT Goal: RH STG INCREASE KNOWLEDGE OF HYPERTENSION Description: Patient and or family will demonstrate understanding of measures to manage HTN with medications, dietary restrictions and exercise using handouts and educational resources with cues/reminders Outcome: Progressing Goal: RH STG INCREASE KNOWLEDGE OF DYSPHAGIA/FLUID INTAKE Description: Patient will be knowledgeable of dysphagia fluid types and intake Patient and or family will demonstrate understanding of  measures to manage dysphagia dietary restrictions using handouts and educational resources with cues/reminders Outcome: Progressing Goal: RH STG INCREASE KNOWLEGDE OF HYPERLIPIDEMIA Description: Pt will be knowledgeable of the risks of hyperlipidemia and the use of medication and diet to prevent disease and exercise using handouts and educational resources with cues/reminders Outcome: Progressing Goal: RH STG INCREASE KNOWLEDGE OF STROKE PROPHYLAXIS Description: Pt will be knowledgeable regarding prevention of stroke including ews medication and diet management, importance of follow up appointments and medical compliance with cues/reminder assistance Outcome: Progressing   Problem: RH PAIN MANAGEMENT Goal: RH STG PAIN MANAGED AT OR BELOW PT'S PAIN GOAL Description: Patient will be free of pain Outcome: Progressing   Problem: RH SAFETY Goal: RH STG ADHERE TO SAFETY PRECAUTIONS W/ASSISTANCE/DEVICE Description: STG Adhere to Safety Precautions With min Assistance/Device. Outcome: Progressing

## 2019-09-21 NOTE — Progress Notes (Signed)
Occupational Therapy Session Note  Patient Details  Name: [name] MRN: [id number] Date of Birth: [birth year]  Today's Date: 09/21/2019 OT Individual Time: 1300-1400 OT Individual Time Calculation (min): 60 min    Short Term Goals: Week 2:  OT Short Term Goal 1 (Week 2): Pt will sit EOB statically for ~5 min with supervison in prep for ADL task OT Short Term Goal 2 (Week 2): Pt will attend to familar ADL task demonstrating sustained attention for ~3 min with min cues OT Short Term Goal 3 (Week 2): Pt will don shirt with mod A in supported sitting at sink level OT Short Term Goal 4 (Week 2): Pt will roll in bed with mod A in prep for ADL tasks  Skilled Therapeutic Interventions/Progress Updates:    Patient seated in TIS w/c - she indicates that her bottom is sore.  Daughter present for therapy session.  Sit pivot transfer w/c to bed with SB in place max A of one with CS of 2nd person.  Sit to supine max A of one.  Patient with soiled brief - dependent to change/complete hygiene.  Rolling in bed with mod A of one.  Side lying to sitting edge of bed with max A.  Completed unsupported sitting activity with focus on posture, activation of proximal musculature, midline orientation and reaching with left hand across body and OH.  Noted right scapular activation and trunk extension with attempts to reach.  She is able to maintain unsupported sitting with min A but requires max A for consistent upright posture - ongoing cues for head and attention to task.  Returned to supine position with right UE, PROM, and facilitation attempts - no volitional motor control in supine position as she had difficulty staying awake.   Reviewed positioning, skin protection of bony prominences, PROM shoulder (below 90) to hand, joint mobility and activities to stimulate recovery with daughter - she demonstrates good understanding.  Patient remained in bed at close of session, positioned in sidelying with support of pillows,  bed alarm set.    Therapy Documentation Precautions:  Precautions Precautions: Fall Precaution Comments: corerack; R inattention Restrictions Weight Bearing Restrictions: No   Therapy/Group: Individual Therapy  Carlos Levering 09/21/2019, 7:50 AM

## 2019-09-21 NOTE — Progress Notes (Signed)
Physical Therapy Weekly Progress Note  Patient Details  Name: Toccara TAURIEL SCRONCE MRN: [id number] Date of Birth: 1932-01-27  Beginning of progress report period: September 15, 2019 End of progress report period: September 21, 2019  Today's Date: 09/21/2019 PT Individual Time: 4081-4481 and [address] PT Individual Time Calculation (min): 68 min and 40 min   Patient has met 3 of 3 short term goals.  Pt is making slow progress towards LTG. Poor awareness, lethargy, r side inattention and dense hemiplegia as well as continued pushers syndrome have limited progress up to this point. Pt has progressed to max assist bed mobility with 1 assist, max assist squat pivot tranfers and initiated WC mobility with hemi technique over the past week.  Patient continues to demonstrate the following deficits muscle weakness, muscle joint tightness and muscle paralysis, decreased cardiorespiratoy endurance, abnormal tone, unbalanced muscle activation, motor apraxia, decreased coordination and decreased motor planning, decreased visual acuity, decreased visual perceptual skills and field cut, decreased attention to right, decreased initiation, decreased attention, decreased awareness, decreased problem solving, decreased safety awareness, decreased memory and delayed processing and decreased sitting balance, decreased standing balance, decreased postural control, hemiplegia and decreased balance strategies and therefore will continue to benefit from skilled PT intervention to increase functional independence with mobility.  Patient progressing toward long term goals..  Continue plan of care.  PT Short Term Goals Week 2:  PT Short Term Goal 1 (Week 2): Pt will consistently transfer to and from [facility] with max assist of 1 PT Short Term Goal 1 - Progress (Week 2): Met PT Short Term Goal 2 (Week 2): Pt will initiate WC mobility training PT Short Term Goal 2 - Progress (Week 2): Met PT Short Term Goal 3 (Week 2): Pt will sit EOB  with min assist from PT while engaged in functional task. PT Short Term Goal 3 - Progress (Week 2): Met Week 3:  PT Short Term Goal 1 (Week 3): STG=LTG due to ELOS with d/c to SNF  Skilled Therapeutic Interventions/Progress Updates:  Session 1   Pt received supine in bed and agreeable to PT.  Supine>sit transfer with max assist assist and cues for attention to the RU/RLE. Sitting balance EOB with mod assist initially to prevent R/posteiror LOB, but improved with time in sitting and cues for UE placement.   Transfer training with max assist of 1 with RLE blocked for squat pivot R. sit<>stand with various UE support including HW and rail in hall.   WC mobility with LLE hemi techniqe x 161f with total assist due to extensor response in LLE requiring manunal facilitation to inhibit extension in the knee and force HS activation.   Gait training at rail in hall. 2 x 138fwith total A to advance the RLE and block knee in stance. No active extension noted on this day in stance. +2 for WC follow. PT assessed orthostatic VS sitting: 125/77, HR 102. Standing 107/94, HR 92. No overt s/s of orthostasis. Pt noted to have been incontinent of bowel in standing.   Pt returned to room and performed squat pivot transfer to bed with total A with bobath technique. Sit>supine completed with total A to control trunk and BLE into sidelying position. Rolling R and L for PT and NT to perform clothing management and pericare with max assist to the L and mod assist to the R with heavy use of UE on rail. Supine>sit with max assist and max cues for use of LUE to push into sitting once BLE  off EOB. Squat pivot to TIS WC per daughter request with max assist and RLE blocked. Patient left sitting in [facility] with call bell in reach and all needs met.     Session 2.  Pt received supine in bed and agreeable to PT. [name] present and reports that pt had small incontinent bowel movement. Rolling to the R with mod assist to remove  brief. peri car completed by PT with pt in sidelying. Max assist to the Roll to the R to don clean brief and pants x 2. Supine>sit transfer with  Max assist and cues for improved postural alignment and use of LUE to push into sitting.  PT replaced WC cushion with hybrid Roho for improved sacral pressure relief and adjusted WC head rest to allow improved tolerance to sitting upright in WC. Left in [facility] with daughter present and all needs met.        Therapy Documentation Precautions:  Precautions Precautions: Fall Precaution Comments: corerack; R inattention Restrictions Weight Bearing Restrictions: No Pain: Faces: denies Therapy/Group: Individual Therapy  Lorie Phenix 09/21/2019, 10:26 AM

## 2019-09-21 NOTE — Progress Notes (Signed)
Patient ID: [name], female   DOB: [birth year], 86 y.o.   MRN: [id number] Follow up with the patient's daughter regarding current status and progression since admission.Reported that she was concerned that the patient had missed breakfast due to treatments and therapy schedule. Request for therapy to be scheduled later in the day and handouts/tips on activities they could do with the patient after hours to continue the progression made with therapies, specifically ROM or cues/questions, etc. Therapy scheduler notified of requests and therapy asked to leave handouts with the daughter. No other concerns noted at present. Margarito Liner

## 2019-09-22 ENCOUNTER — Inpatient Hospital Stay (HOSPITAL_COMMUNITY): Payer: Medicare Other | Admitting: Occupational Therapy

## 2019-09-22 ENCOUNTER — Inpatient Hospital Stay (HOSPITAL_COMMUNITY): Payer: Medicare Other | Admitting: Physical Therapy

## 2019-09-22 ENCOUNTER — Inpatient Hospital Stay (HOSPITAL_COMMUNITY): Payer: Medicare Other | Admitting: Speech Pathology

## 2019-09-22 ENCOUNTER — Inpatient Hospital Stay (HOSPITAL_COMMUNITY): Payer: Medicare Other

## 2019-09-22 HISTORY — PX: IR GASTROSTOMY TUBE MOD SED: IMG625

## 2019-09-22 LAB — GLUCOSE, CAPILLARY: Glucose-Capillary: 150 mg/dL — ABNORMAL HIGH (ref 70–99)

## 2019-09-22 MED ORDER — CEFAZOLIN SODIUM-DEXTROSE 2-4 GM/100ML-% IV SOLN
INTRAVENOUS | Status: AC
  Filled 2019-09-22: qty 100

## 2019-09-22 MED ORDER — CEPHALEXIN 250 MG PO CAPS
250.0000 mg | ORAL_CAPSULE | Freq: Three times a day (TID) | ORAL | Status: DC
  Administered 2019-09-22 – 2019-09-26 (×12): 250 mg via ORAL
  Filled 2019-09-22 (×13): qty 1

## 2019-09-22 MED ORDER — CEPHALEXIN 250 MG PO CAPS: 250.0000 mg | ORAL_CAPSULE | Freq: Three times a day (TID) | ORAL | Status: DC

## 2019-09-22 MED ORDER — CEFAZOLIN (ANCEF) 1 G IV SOLR
2.0000 g | INTRAVENOUS | Status: DC
  Filled 2019-09-22: qty 2

## 2019-09-22 MED ORDER — GLUCAGON HCL RDNA (DIAGNOSTIC) 1 MG IJ SOLR
INTRAMUSCULAR | Status: AC
  Filled 2019-09-22: qty 1

## 2019-09-22 MED ORDER — IOHEXOL 300 MG/ML  SOLN
50.0000 mL | Freq: Once | INTRAMUSCULAR | Status: AC | PRN
  Administered 2019-09-22: 15 mL

## 2019-09-22 MED ORDER — LIDOCAINE HCL (PF) 1 % IJ SOLN
INTRAMUSCULAR | Status: AC | PRN
  Administered 2019-09-22: 10 mL

## 2019-09-22 MED ORDER — MIDAZOLAM HCL 2 MG/2ML IJ SOLN
INTRAMUSCULAR | Status: AC | PRN
  Administered 2019-09-22: 0.5 mg via INTRAVENOUS

## 2019-09-22 MED ORDER — GLUCAGON HCL RDNA (DIAGNOSTIC) 1 MG IJ SOLR
INTRAMUSCULAR | Status: AC | PRN
  Administered 2019-09-22: 1 mg via INTRAVENOUS

## 2019-09-22 MED ORDER — CEFAZOLIN SODIUM-DEXTROSE 2-4 GM/100ML-% IV SOLN
2.0000 g | Freq: Once | INTRAVENOUS | Status: AC
  Administered 2019-09-22: 2 g via INTRAVENOUS

## 2019-09-22 MED ORDER — SODIUM CHLORIDE 0.9 % IV SOLN
INTRAVENOUS | Status: AC | PRN
  Administered 2019-09-22: 10 mL/h via INTRAVENOUS

## 2019-09-22 MED ORDER — FENTANYL CITRATE (PF) 100 MCG/2ML IJ SOLN
INTRAMUSCULAR | Status: AC | PRN
  Administered 2019-09-22: 25 ug via INTRAVENOUS

## 2019-09-22 MED ORDER — FENTANYL CITRATE (PF) 100 MCG/2ML IJ SOLN
INTRAMUSCULAR | Status: AC
  Filled 2019-09-22: qty 2

## 2019-09-22 MED ORDER — MIDAZOLAM HCL 2 MG/2ML IJ SOLN
INTRAMUSCULAR | Status: AC
  Filled 2019-09-22: qty 2

## 2019-09-22 MED ORDER — LIDOCAINE HCL 1 % IJ SOLN
INTRAMUSCULAR | Status: AC
  Filled 2019-09-22: qty 20

## 2019-09-22 NOTE — Plan of Care (Signed)
  Problem: RH BLADDER ELIMINATION Goal: RH STG MANAGE BLADDER WITH ASSISTANCE Description: STG Manage Bladder With min Assistance Outcome: Progressing Goal: RH STG MANAGE BLADDER WITH EQUIPMENT WITH ASSISTANCE Description: STG Manage Bladder With Equipment With total Assistance Outcome: Progressing   Problem: RH SKIN INTEGRITY Goal: RH STG SKIN FREE OF INFECTION/BREAKDOWN Description: With min assist  Outcome: Progressing Goal: RH STG MAINTAIN SKIN INTEGRITY WITH ASSISTANCE Description: STG Maintain Skin Integrity With  min Assistance. Outcome: Progressing Goal: RH STG ABLE TO PERFORM INCISION/WOUND CARE W/ASSISTANCE Description: STG Able To Perform Incision/Wound Care With min Assistance. Outcome: Progressing   Problem: RH SAFETY Goal: RH STG ADHERE TO SAFETY PRECAUTIONS W/ASSISTANCE/DEVICE Description: STG Adhere to Safety Precautions With min Assistance/Device. Outcome: Progressing

## 2019-09-22 NOTE — Progress Notes (Signed)
[place] PHYSICAL MEDICINE & REHABILITATION PROGRESS NOTE   Subjective/Complaints: Pt had an episode of "pain all over" accompanied by HR ~100.  Pt somnolent this am, remains globally aphasic  Spoke at length with daughter regarding cath vols, pt starting to void  Gastrostomy planned for today   ROS: limited due to language/communication    Objective:   CT ABDOMEN WO CONTRAST  Result Date: 09/21/2019 CLINICAL DATA:  History of left MCA infarct. Evaluate anatomy for gastrostomy tube placement. EXAM: CT ABDOMEN WITHOUT CONTRAST TECHNIQUE: Multidetector CT imaging of the abdomen was performed following the standard protocol without IV contrast. COMPARISON:  05/12/2016 FINDINGS: Lower chest: Lung bases are clear. Hepatobiliary: Normal appearance of the liver and gallbladder. Pancreas: Unremarkable. No pancreatic ductal dilatation or surrounding inflammatory changes. Spleen: Normal in size without focal abnormality. Adrenals/Urinary Tract: Normal appearance of the adrenal glands. Kidneys have a lobulated appearance without hydronephrosis. Probable tiny hyperdense cyst along the right kidney upper pole and similar to the previous examination. Stomach/Bowel: Nasogastric tube extends into the distal stomach. Stomach is in a normal location. There is no bowel positioned between the stomach in the anterior abdominal wall. The transverse colon is caudal to the stomach and incompletely imaged. Scattered colonic diverticula. No evidence for small bowel dilatation. Vascular/Lymphatic: Abdominal aorta is heavily calcified without aneurysm. Coronary artery calcifications. No lymph node enlargement in the abdomen. Other: Negative for ascites. Musculoskeletal: Again noted is interbody fusion at L2, L3 and L4. Plate and screw fixation along the left side of the surgically fused vertebral bodies. Stable compression deformity at L1. Disc space loss at L1-L2. IMPRESSION: 1. Anatomy is amendable for percutaneous  gastrostomy tube placement. 2.  Aortic Atherosclerosis (ICD10-I70.0). Electronically Signed   By: Markus Daft M.D.   On: 09/21/2019 11:04   Recent Labs    09/19/19 1115  WBC 8.0  HGB 13.8  HCT 42.2  PLT 406*   No results for input(s): NA, K, CL, CO2, GLUCOSE, BUN, CREATININE, CALCIUM in the last 72 hours.  Intake/Output Summary (Last 24 hours) at 09/22/2019 0911 Last data filed at 09/22/2019 0035 Gross per 24 hour  Intake 916 ml  Output 500 ml  Net 416 ml     Physical Exam: Vital Signs Blood pressure (!) 149/85, pulse 64, temperature 98.6 F (37 C), temperature source Oral, resp. rate 18, height 5\' 5"  (1.651 m), weight 80.9 kg, SpO2 98 %.  General: No acute distress Mood and affect are appropriate Heart: Regular rate and rhythm no rubs murmurs or extra sounds Lungs: Clear to auscultation, breathing unlabored, no rales or wheezes Abdomen: Positive bowel sounds, soft nontender to palpation, nondistended Extremities: No clubbing, cyanosis, or edema Skin: No evidence of breakdown, no evidence of rash   Skin: Ecchymosis on forearm, right, no evidence of rash Neurologic: 4/5 in the left deltoid bicep tricep grip hip flexion knee extension ankle flexion Medical extremity without active movement right lower extremity has trace ankle plantarflexion  Musculoskeletal:No joint swelling, patient with poor bed positioning, leans toward the right side    Assessment/Plan: 1. Functional deficits secondary to Left BG /MCA infarct  which require 3+ hours per day of interdisciplinary therapy in a comprehensive inpatient rehab setting.  Physiatrist is providing close team supervision and 24 hour management of active medical problems listed below.  Physiatrist and rehab team continue to assess barriers to discharge/monitor patient progress toward functional and medical goals  Care Tool:  Bathing        Body parts bathed by helper:  Right arm, Right lower leg, Left arm, Chest, Left lower  leg, Face, Abdomen, Front perineal area, Buttocks, Right upper leg, Left upper leg     Bathing assist Assist Level: 2 Helpers     Upper Body Dressing/Undressing Upper body dressing   What is the patient wearing?: Pull over shirt    Upper body assist Assist Level: Maximal Assistance - Patient 25 - 49%    Lower Body Dressing/Undressing Lower body dressing      What is the patient wearing?: Pants     Lower body assist Assist for lower body dressing: 2 Helpers     Toileting Toileting    Toileting assist Assist for toileting: Dependent - Patient 0%     Transfers Chair/bed transfer  Transfers assist  Chair/bed transfer activity did not occur: Safety/medical concerns (would recommend lift for patient- needed skilled intervention to transfer)  Chair/bed transfer assist level: 2 Helpers     Locomotion Ambulation   Ambulation assist   Ambulation activity did not occur: Safety/medical concerns          Walk 10 feet activity   Assist  Walk 10 feet activity did not occur: Safety/medical concerns        Walk 50 feet activity   Assist Walk 50 feet with 2 turns activity did not occur: Safety/medical concerns         Walk 150 feet activity   Assist Walk 150 feet activity did not occur: Safety/medical concerns         Walk 10 feet on uneven surface  activity   Assist Walk 10 feet on uneven surfaces activity did not occur: Safety/medical concerns         Wheelchair     Assist Will patient use wheelchair at discharge?: Yes      Wheelchair assist level: Dependent - Patient 0% Max wheelchair distance: 150    Wheelchair 50 feet with 2 turns activity    Assist        Assist Level: Dependent - Patient 0%   Wheelchair 150 feet activity     Assist      Assist Level: Dependent - Patient 0%   Blood pressure (!) 149/85, pulse 64, temperature 98.6 F (37 C), temperature source Oral, resp. rate 18, height 5\' 5"  (1.651 m),  weight 80.9 kg, SpO2 98 %.  Medical Problem List and Plan: 1.Right side weakness and aphasiasecondary to left MCA territory infarction status post IR revascularization with SAH -patient may Shower if has good enough sitting balance -ELOS/Goals: Plan for discharge 9/7 to SNF, pending completion of gastrostomy     Continue CIR 2. Antithrombotics: -DVT/anticoagulation:SCDs- had petechial hemrrhages -antiplatelet therapy: Aspirin 81 mg daily 3. Pain Management:will schedule  Tylenol q6 hours through NGT. Well controlled.  4. Mood:Zoloft 50 mg daily, Aricept, February 9 10 mg nightly-  -Methylphenidate increase to 10 mg BID-antipsychotic agents: N/A 5. Neuropsych: This patientisNOTcapable of making decisions on herown behalf. 6. Skin/Wound Care:Routine skin checks 7. Fluids/Electrolytes/Nutrition:Routine in and outs with follow-up chemistries, Change TF times 8. Dysphagia. Tube feeds as directed. Dietary follow-up. IR to assess for   PEG   -monitor intake as well as S/Sx of asp on po   -We will check chest x-ray, check CBC  9. Hypertension. Lopressor 50 mg twice daily, Cozaar 25 mg daily, Norvasc 10 mg daily.  Vitals:   09/22/19 0409 09/22/19 0429  BP: (!) 149/85   Pulse: 64   Resp: 18   Temp: 98.6 F (37 C)  SpO2: (!) 68% 98%  suboptimal  control 9/2, starting flomax for bladder  Monitor BP effects  10. Hyperlipidemia. Lipitor 11. History of alcohol use. Provide counseling with patient and family 15. Atrial fibrillation. No anticoagulation at this time.   Continue beta-blocker  HR controlled 8/28. 13. Urinary retention/proteus UTI- discussed with daughter this may cause a temporary setback in recovery   Recheck UA + for bact and WBCs, empiric keflex until the culture is back  Increase ICP freq due to high volumes   urecholine to help with emptying- increased to 25mg  8/24, Flomax started 9/2  -purewick if  needed to keep skin dry, but mostly retaining right now 14. Sleep-/insomnia- pt took 30, not 3 mg of Melatonin nightly at home AND 3+ pills of ZZZquil at home to sleep nightly per daughter- Juluis Rainier  8/28: daughter is concerned regarding sleep apnea. Would like outpatient sleep study.   Sleep  Graph  15.  Constipation small daily BM will check KUB, schedule senokot, (not senna S due to mushy stools), may cause some GI discomfort, Already on Protonix given history of alcohol abuse with potential for gastritis LOS: 15 days A FACE TO FACE EVALUATION WAS PERFORMED  Charlett Blake 09/22/2019, 9:11 AM

## 2019-09-22 NOTE — Progress Notes (Signed)
Physical Therapy Session Note  Patient Details  Name: [name] MRN: [id number] Date of Birth: 1932/10/04  Today's Date: 09/22/2019   Short Term Goals: Week 3:  PT Short Term Goal 1 (Week 3): STG=LTG due to ELOS with d/c to SNF  Skilled Therapeutic Interventions/Progress Updates:   Pt off unit for PEG tube placement. Will re-attempt treatment at later time/day provided pt in medically appropriate.      Therapy Documentation Precautions:  Precautions Precautions: Fall Precaution Comments: corerack; R inattention Restrictions Weight Bearing Restrictions: No General: PT Amount of Missed Time (min): 75 Minutes PT Missed Treatment Reason: CT/MRI;Unavailable (Comment) (PEG placement)   Therapy/Group: Individual Therapy  Lorie Phenix 09/22/2019, 11:34 AM

## 2019-09-22 NOTE — Sedation Documentation (Signed)
Pt arrived to radiology nurses station. Pt is sleeping, daughter at bedside. Vitals stable

## 2019-09-22 NOTE — Sedation Documentation (Signed)
Sinus tach

## 2019-09-22 NOTE — Procedures (Signed)
Interventional Radiology Procedure Note  Procedure: Placement of percutaneous 20F pull-through gastrostomy tube. Complications: None Recommendations: - NPO except for sips and chips remainder of today and overnight - Maintain G-tube to LWS until tomorrow morning  - May advance diet as tolerated and begin using tube tomorrow morning  Signed,  Devesh Monforte K. Antonella Upson, MD   

## 2019-09-22 NOTE — Plan of Care (Signed)
  Problem: RH Eating Goal: LTG Patient will perform eating w/assist, cues/equip (OT) Description: LTG: Patient will perform eating with assist, with/without cues using equipment (OT) Outcome: Not Applicable Flowsheets (Taken 09/22/2019 1738) LTG: Pt will perform eating with assistance level of: (goal discharged secondary to pt needing PEG tube) -- Note: Goal discharged secondary to pt needing PEG tube.   Problem: RH Tub/Shower Transfers Goal: LTG Patient will perform tub/shower transfers w/assist (OT) Description: LTG: Patient will perform tub/shower transfers with assist, with/without cues using equipment (OT) Outcome: Not Applicable Flowsheets (Taken 09/22/2019 1738) LTG: Pt will perform tub/shower stall transfers with assistance level of: (Goal discharged) --

## 2019-09-22 NOTE — Sedation Documentation (Signed)
Sinus rhythm

## 2019-09-22 NOTE — Progress Notes (Signed)
Pt off floor with transport to IR

## 2019-09-22 NOTE — Progress Notes (Signed)
Patient received this shift s/p gastrostomy tube placement.  Coretrak to right nare remains in place and clamped.  TFs to resume on  09/23/19 at noon.  Medications cleared to be administered via patent G-tube by gravity and without difficulty.  Free water flushes of 200 mL Q6H.  Bladder scans performed Q8H as ordered.  Although results have been less than 350 mL for I/O cath, patient has been straight cathed due to daughter's concern for high volume urinary stasis. Resultant 400 mL and 500 mL this shift.  Patient continues on Keflex 250 mg Q8H for UTI treatment.  Patent #20 gauge PIV saline locked to left antecubital.  Frequent personal care and off-loading to maintain skin integrity.  Patient currently resting calmly and in no apparent distress.  Vital signs stable.  No current needs or concerns at the time of this writing.  Nursing will continue to monitor.  Tenasia Aull B. Rulon Eisenmenger, MSN, RN, CNL IP Rehab

## 2019-09-22 NOTE — Progress Notes (Signed)
SLP Cancellation Note  Patient Details Name: [name] MRN: [id number] DOB: 09-25-1932   Cancelled treatment:       Patient missed 60 minutes of skilled SLP intervention due to recently returning from a PEG procedure with need to remain NPO, be hooked up to the wall suction for 3 hours and inability to maintain arousal.                                                                                                 Manasa Spease 09/22/2019, 1:15 PM

## 2019-09-22 NOTE — Progress Notes (Signed)
Occupational Therapy Weekly Progress Note  Patient Details  Name: [name] MRN: [id number] Date of Birth: 1932/04/05  Beginning of progress report period: September 16, 2019 End of progress report period: September 22, 2019  Today's Date: 09/22/2019 OT Individual Time: 4742-5956 OT Individual Time Calculation (min): 58 min    Patient has met 0 of 4 short term goals.  Ms. Eisenhart continues to make slower progress than expected secondary to the severity of her current deficits.  She continues to need max instructional cueing for sustained attention and initiation to simple selfcare tasks such as bathing or grooming.  Increased pushing to the heim paretic right side is still present in both sitting and standing as well.  Static sitting balance is at a min assist level overall with dynamic sitting balance during selfcare tasks at a max assist level.  Max hand over hand assistance is needed for integration of the RUE into tasks with Brunnstrum stage I-II movement noted in the arm and hand at this time.  Ms. Chou continues to demonstrate right inattention.  Right neglect is present as well.  Squat or stand pivot transfers are at a max assist level from bed to wheelchair or from wheelchair to drop arm commode.  She continues to need total assist +2 for clothing management in standing as well as for LB bathing or dressing sit to stand.  Expressive difficulty is still present with some delays also with processing and following 1-2 step commands.  Overall progress has been slower than originally expected and pt will not have adequate supervision for discharge home with family.  SNF is the best option at this time for 24 hour assist and continued OT therapy.  Will continue with current OT POC with expected discharge early next week if medically ready.    Patient continues to demonstrate the following deficits: muscle weakness, impaired timing and sequencing, unbalanced muscle activation, motor apraxia and  decreased coordination, decreased midline orientation, decreased attention to right and right side neglect, decreased attention, decreased awareness, decreased problem solving, decreased safety awareness, decreased memory and delayed processing and decreased sitting balance, decreased standing balance, decreased postural control, hemiplegia and decreased balance strategies and therefore will continue to benefit from skilled OT intervention to enhance overall performance with BADL and Reduce care partner burden.  Patient not progressing toward long term goals.  See goal revision..  Continue plan of care.  OT Short Term Goals Week 3:  OT Short Term Goal 1 (Week 3): Continue working on downgraded LTGs in preparation for discharge to SNF?  Skilled Therapeutic Interventions/Progress Updates:    Pt worked on bathing and dressing from [place] during session.  She needed max assist for transition to sitting from supine.  Min assist for initial sitting balance with the LUE supported on the bed rail.  She needed max instructional cueing for initiation of UB bathing with overall max assist for thoroughness.  Max assist was also needed for dynamic sitting balance during bathing and dressing tasks secondary to increased pushing to the right and LOB.  Total +2 (pt 30%) was needed for LB bathing and dressing when washing buttocks and peri area as well as when pulling items over her hips.  Pt demonstrated both bladder and bowel incontinence when standing as well.  Max assist was needed for threading all LB garments as well as for donning pullover shirt.  Max assist was also needed for stand pivot transfer to the left to the wheelchair at the end of the session.  Pt was left sitting up with her daughter present and with the RUE supported on pillows against the bed.  Call button in reach of family and pt still hooked up to wall suction at the PEG site.    Therapy Documentation Precautions:  Precautions Precautions:  Fall Precaution Comments: corerack; R inattention, peg tube placement on 9/2 Restrictions Weight Bearing Restrictions: No  Pain: Pain Assessment Pain Scale: Faces Pain Score: 0-No pain ADL: See Care Tool Section for some details of selfcare and mobility  Therapy/Group: Individual Therapy  Eriyah Fernando OTR/L 09/22/2019, 5:16 PM

## 2019-09-22 NOTE — Progress Notes (Signed)
Patient currently resting calmly in no apparent distress.  Tolerated all scheduled medications and treatments.  TFs stopped at midnight per Provider order and Coretrak flushed and clamped in anticipation of percutaneous gastrostomy tube placement in AM.  #20 gauge peripheral IV placed to left antecubital and saline locked.  Personal care performed following incontinent bladder and bowel episodes.  No further needs or concerns at the time of this writing.  Nursing will continue to monitor closely.

## 2019-09-22 NOTE — Progress Notes (Signed)
Pt returned to floor at approximately 1215. Orders received. Call placed to IR to verify orders for wall suction and medications through Gtube. Larene Beach PA verified that pt should be connected to low wall suction by new Gtube x 3hrs. Stated that pt could receive meds through Cortrak or G tube but that no feedings through tube until assessment by IR on Saturday.

## 2019-09-23 ENCOUNTER — Inpatient Hospital Stay (HOSPITAL_COMMUNITY): Payer: Medicare Other | Admitting: Physical Therapy

## 2019-09-23 ENCOUNTER — Inpatient Hospital Stay (HOSPITAL_COMMUNITY): Payer: Medicare Other | Admitting: Speech Pathology

## 2019-09-23 ENCOUNTER — Inpatient Hospital Stay (HOSPITAL_COMMUNITY): Payer: Medicare Other

## 2019-09-23 LAB — URINE CULTURE: Culture: >=100000 — AB

## 2019-09-23 MED ORDER — LIDOCAINE 5 % EX PTCH
2.0000 | MEDICATED_PATCH | CUTANEOUS | Status: DC
  Administered 2019-09-23: 2 via TRANSDERMAL
  Filled 2019-09-23: qty 2

## 2019-09-23 MED ORDER — AMANTADINE HCL 50 MG/5ML PO SYRP
100.0000 mg | ORAL_SOLUTION | Freq: Every day | ORAL | Status: DC
  Administered 2019-09-23 – 2019-09-24 (×2): 100 mg
  Filled 2019-09-23 (×2): qty 10

## 2019-09-23 NOTE — Plan of Care (Signed)
  Problem: Consults Goal: RH STROKE PATIENT EDUCATION Description: See Patient Education module for education specifics  Outcome: Progressing Goal: Nutrition Consult-if indicated Outcome: Progressing Goal: Diabetes Guidelines if Diabetic/Glucose > 140 Description: If diabetic or lab glucose is > 140 mg/dl - Initiate Diabetes/Hyperglycemia Guidelines & Document Interventions  Outcome: Progressing   Problem: RH BOWEL ELIMINATION Goal: RH STG MANAGE BOWEL WITH ASSISTANCE Description: STG Manage Bowel with min Assistance. Outcome: Progressing Goal: RH STG MANAGE BOWEL W/MEDICATION W/ASSISTANCE Description: STG Manage Bowel with Medication with min Assistance. Outcome: Progressing   Problem: RH BLADDER ELIMINATION Goal: RH STG MANAGE BLADDER WITH ASSISTANCE Description: STG Manage Bladder With max Assistance Outcome: Progressing Goal: RH STG MANAGE BLADDER WITH EQUIPMENT WITH ASSISTANCE Description: STG Manage Bladder With Equipment With total Assistance Outcome: Progressing   Problem: RH SKIN INTEGRITY Goal: RH STG SKIN FREE OF INFECTION/BREAKDOWN Description: With min assist  Outcome: Progressing Goal: RH STG MAINTAIN SKIN INTEGRITY WITH ASSISTANCE Description: STG Maintain Skin Integrity With  min Assistance. Outcome: Progressing Goal: RH STG ABLE TO PERFORM INCISION/WOUND CARE W/ASSISTANCE Description: STG Able To Perform Incision/Wound Care With min Assistance. Outcome: Progressing   Problem: RH SAFETY Goal: RH STG ADHERE TO SAFETY PRECAUTIONS W/ASSISTANCE/DEVICE Description: STG Adhere to Safety Precautions With min Assistance/Device. Outcome: Progressing   Problem: RH PAIN MANAGEMENT Goal: RH STG PAIN MANAGED AT OR BELOW PT'S PAIN GOAL Description: Patient will be free of pain Outcome: Progressing   Problem: RH KNOWLEDGE DEFICIT Goal: RH STG INCREASE KNOWLEDGE OF HYPERTENSION Description: Patient and or family will demonstrate understanding of measures to manage  HTN with medications, dietary restrictions and exercise using handouts and educational resources with cues/reminders Outcome: Progressing Goal: RH STG INCREASE KNOWLEDGE OF DYSPHAGIA/FLUID INTAKE Description: Patient will be knowledgeable of dysphagia fluid types and intake Patient and or family will demonstrate understanding of measures to manage dysphagia dietary restrictions using handouts and educational resources with cues/reminders Outcome: Progressing Goal: RH STG INCREASE KNOWLEGDE OF HYPERLIPIDEMIA Description: Pt will be knowledgeable of the risks of hyperlipidemia and the use of medication and diet to prevent disease and exercise using handouts and educational resources with cues/reminders Outcome: Progressing Goal: RH STG INCREASE KNOWLEDGE OF STROKE PROPHYLAXIS Description: Pt will be knowledgeable regarding prevention of stroke including ews medication and diet management, importance of follow up appointments and medical compliance with cues/reminder assistance Outcome: Progressing   Problem: RH Vision Goal: RH LTG Vision (Specify) Outcome: Progressing

## 2019-09-23 NOTE — Progress Notes (Signed)
Occupational Therapy Session Note  Patient Details  Name: [name] MRN: [id number] Date of Birth: 10/22/1932  Today's Date: 09/23/2019 OT Individual Time: 1330-1400 OT Individual Time Calculation (min): 30 min  and Today's Date: 09/23/2019 OT Missed Time: 30 Minutes Missed Time Reason: Patient fatigue   Short Term Goals: Week 3:  OT Short Term Goal 1 (Week 3): Continue working on downgraded LTGs in preparation for discharge to SNF?  Skilled Therapeutic Interventions/Progress Updates:    OT session focused on postural control, activity tolerance, and direction following. Pt received supine in bed visibly fatigued and daughter reporting sensitivity at G-tube placement. Pt declined transferring to w/c, however agreeable to sit EOB. Pt completed supine>sit with max A while following simple 1-2 step directions with 60% accuracy. Client sustained static sitting balance EOB with LUE supported on rail for 30-40 seconds max. Immediately upon release, pt demonstrated LOB to right with no attempts to correct. Completed dynamic reaching activity focusing on trunk extension and weight shifting to left side as she stacked 8 cones. Pt with max difficulty keeping eyes open, therefore transitioned to supine. Daughter reporting increased fatigue this afternoon, therefore session shortened to 30 minutes.   Therapy Documentation Precautions:  Precautions Precautions: Fall Precaution Comments: corerack; R inattention, peg tube placement on 9/2 Restrictions Weight Bearing Restrictions: No General: General OT Amount of Missed Time: 30 Minutes Vital Signs:  Pain: Pain Assessment Faces Pain Scale: Hurts a little bit ADL: ADL Eating: NPO Grooming: Dependent Upper Body Bathing: Dependent Lower Body Bathing: Dependent Upper Body Dressing: Dependent Lower Body Dressing: Dependent Toileting: Dependent Toilet Transfer: Not assessed Tub/Shower Transfer: Not assessed Vision   Perception    Praxis    Exercises:   Other Treatments:     Therapy/Group: Individual Therapy  Duayne Cal 09/23/2019, 2:04 PM

## 2019-09-23 NOTE — Progress Notes (Signed)
[facility] PHYSICAL MEDICINE & REHABILITATION PROGRESS NOTE   Subjective/Complaints:   Pt and daughter reports by nodding that has pain in R low back/lateral hip due to sciatica- asked for lidoderm patches- doesn't want anything like tramadol, etc that could sedate her more.   Are interested in something to 'wake her up"- can try Amantadine.   IR has said can use PEG- will get her TFs restarted-  And d/c cortrak  Denies constipation. Hasn't "eaten in awhile".    ROS: limited due to language/aphasia   Objective:   IR GASTROSTOMY TUBE MOD SED  Result Date: 09/22/2019 INDICATION: 86 year old female with dysphagia. She requires placement of a percutaneous gastrostomy tube for nutrition. EXAM: Fluoroscopically guided placement of percutaneous pull-through gastrostomy tube Interventional Radiologist:  Criselda Peaches, MD MEDICATIONS: 2 g Ancef, 1 mg glucagon; Antibiotics were administered within 1 hour of the procedure. ANESTHESIA/SEDATION: Versed 0.5 mg IV; Fentanyl 25 mcg IV Moderate Sedation Time:  14 minutes The patient was continuously monitored during the procedure by the interventional radiology nurse under my direct supervision. CONTRAST:  61mL OMNIPAQUE IOHEXOL 300 MG/ML  SOLN FLUOROSCOPY TIME:  Fluoroscopy Time: 7 minutes 42 seconds (33 mGy). COMPLICATIONS: None immediate. PROCEDURE: Informed written consent was obtained from the patient after a thorough discussion of the procedural risks, benefits and alternatives. All questions were addressed. Maximal Sterile Barrier Technique was utilized including caps, mask, sterile gowns, sterile gloves, sterile drape, hand hygiene and skin antiseptic. A timeout was performed prior to the initiation of the procedure. Maximal barrier sterile technique utilized including caps, mask, sterile gowns, sterile gloves, large sterile drape, hand hygiene, and chlorhexadine skin prep. An angled catheter was advanced over a wire under fluoroscopic guidance  through the nose, down the esophagus and into the body of the stomach. The stomach was then insufflated with several 100 ml of air. Fluoroscopy confirmed location of the gastric bubble, as well as inferior displacement of the barium stained colon. Under direct fluoroscopic guidance, a single T-tack was placed, and the anterior gastric wall drawn up against the anterior abdominal wall. Percutaneous access was then obtained into the mid gastric body with an 18 gauge sheath needle. Aspiration of air, and injection of contrast material under fluoroscopy confirmed needle placement. An Amplatz wire was advanced in the gastric body and the access needle exchanged for a 9-French vascular sheath. A snare device was advanced through the vascular sheath and an Amplatz wire advanced through the angled catheter. The Amplatz wire was successfully snared and this was pulled up through the esophagus and out the mouth. A 20-French Alinda Dooms MIC-PEG tube was then connected to the snare and pulled through the mouth, down the esophagus, into the stomach and out to the anterior abdominal wall. Hand injection of contrast material confirmed intragastric location. The T-tack retention suture was then cut. The pull through peg tube was then secured with the external bumper and capped. The patient will be observed for several hours with the newly placed tube on low wall suction to evaluate for any post procedure complication. The patient tolerated the procedure well, there is no immediate complication. IMPRESSION: Successful placement of a 20 French pull through gastrostomy tube. Electronically Signed   By: Jacqulynn Cadet M.D.   On: 09/22/2019 12:48   No results for input(s): WBC, HGB, HCT, PLT in the last 72 hours. No results for input(s): NA, K, CL, CO2, GLUCOSE, BUN, CREATININE, CALCIUM in the last 72 hours.  Intake/Output Summary (Last 24 hours) at 09/23/2019 1551  Last data filed at 09/23/2019 1259 Gross per 24 hour  Intake  100 ml  Output 1400 ml  Net -1300 ml     Physical Exam: Vital Signs Blood pressure 107/62, pulse 84, temperature 97.8 F (36.6 C), resp. rate 20, height 5\' 5"  (1.651 m), weight 80.9 kg, SpO2 94 %.  General: No acute distress- laying in bed- daughter at bedside, appears to be moving a lot due to discomfort, Cortrak in place, NAD Mood and affect are flat- aphasic Heart: RRR Lungs: CTA B/L- no W/R/R- good air movement Abdomen: Soft, NT, ND, (+)BS - slightly TTP just around new PEG_ looks good- no erythema Extremities: No clubbing, cyanosis, or edema Skin: No evidence of breakdown, no evidence of rash   Skin: Ecchymosis on forearm, right, no evidence of rash Neurologic: 4/5 in the left deltoid bicep tricep grip hip flexion knee extension ankle flexion Medical extremity without active movement right lower extremity has trace ankle plantarflexion  Musculoskeletal:No joint swelling, patient with poor bed positioning, leans toward the right side    Assessment/Plan: 1. Functional deficits secondary to Left BG /MCA infarct  which require 3+ hours per day of interdisciplinary therapy in a comprehensive inpatient rehab setting.  Physiatrist is providing close team supervision and 24 hour management of active medical problems listed below.  Physiatrist and rehab team continue to assess barriers to discharge/monitor patient progress toward functional and medical goals  Care Tool:  Bathing    Body parts bathed by patient: Chest, Abdomen, Right upper leg, Left upper leg, Face   Body parts bathed by helper: Right arm, Left arm, Front perineal area, Buttocks, Left lower leg, Right lower leg     Bathing assist Assist Level: Maximal Assistance - Patient 24 - 49%     Upper Body Dressing/Undressing Upper body dressing   What is the patient wearing?: Pull over shirt    Upper body assist Assist Level: Maximal Assistance - Patient 25 - 49%    Lower Body Dressing/Undressing Lower body  dressing      What is the patient wearing?: Pants, Incontinence brief     Lower body assist Assist for lower body dressing: 2 Helpers     Toileting Toileting    Toileting assist Assist for toileting: 2 Helpers     Transfers Chair/bed transfer  Transfers assist  Chair/bed transfer activity did not occur: Safety/medical concerns (would recommend lift for patient- needed skilled intervention to transfer)  Chair/bed transfer assist level: Total Assistance - Patient < 25% (stand pivot)     Locomotion Ambulation   Ambulation assist   Ambulation activity did not occur: Safety/medical concerns          Walk 10 feet activity   Assist  Walk 10 feet activity did not occur: Safety/medical concerns        Walk 50 feet activity   Assist Walk 50 feet with 2 turns activity did not occur: Safety/medical concerns         Walk 150 feet activity   Assist Walk 150 feet activity did not occur: Safety/medical concerns         Walk 10 feet on uneven surface  activity   Assist Walk 10 feet on uneven surfaces activity did not occur: Safety/medical concerns         Wheelchair     Assist Will patient use wheelchair at discharge?: Yes      Wheelchair assist level: Dependent - Patient 0% Max wheelchair distance: 150    Wheelchair 50 feet with  2 turns activity    Assist        Assist Level: Dependent - Patient 0%   Wheelchair 150 feet activity     Assist      Assist Level: Dependent - Patient 0%   Blood pressure 107/62, pulse 84, temperature 97.8 F (36.6 C), resp. rate 20, height 5\' 5"  (1.651 m), weight 80.9 kg, SpO2 94 %.  Medical Problem List and Plan: 1.Right side weakness and aphasiasecondary to left MCA territory infarction status post IR revascularization with SA[facility] -patient may Shower if has good enough sitting balance -ELOS/Goals: Plan for discharge 9/7 to SNF, pending completion of  gastrostomy     Continue CIR 2. Antithrombotics: -DVT/anticoagulation:SCDs- had petechial hemrrhages -antiplatelet therapy: Aspirin 81 mg daily 3. Pain Management:will schedule  Tylenol q6 hours through NGT. Well controlled.   9/4- will add lidoderm 2 patches 12 hr son; 12 hrs off 4. Mood:Zoloft 50 mg daily, Aricept, February 9 10 mg nightly-  -Methylphenidate increase to 10 mg  BID  9/4- can try to add Amantadine 100 mg daily- just in case- already on ritalin 10 mg BID -antipsychotic agents: N/A 5. Neuropsych: This patientisNOTcapable of making decisions on herown behalf. 6. Skin/Wound Care:Routine skin checks 7. Fluids/Electrolytes/Nutrition:Routine in and outs with follow-up chemistries, Change TF times 8. Dysphagia. Tube feeds as directed. Dietary follow-up. IR to assess for   PEG   -monitor intake as well as S/Sx of asp on po   -We will check chest x-ray, check CBC  9/4- can use PEG_ d/c cortrak 9. Hypertension. Lopressor 50 mg twice daily, Cozaar 25 mg daily, Norvasc 10 mg daily.  Vitals:   09/23/19 0531 09/23/19 1411  BP: 114/61 107/62  Pulse: 94 84  Resp: 16 20  Temp: (!) 97.5 F (36.4 C) 97.8 F (36.6 C)  SpO2: 98% 94%  suboptimal  control 9/2, starting flomax for bladder  Monitor BP effects  10. Hyperlipidemia. Lipitor 11. History of alcohol use. Provide counseling with patient and family 72. Atrial fibrillation. No anticoagulation at this time.   Continue beta-blocker  HR controlled 8/28. 13. Urinary retention/proteus UTI- discussed with daughter this may cause a temporary setback in recovery   Recheck UA + for bact and WBCs, empiric keflex until the culture is back  Increase ICP freq due to high volumes   urecholine to help with emptying- increased to 25mg  8/24, Flomax started 9/2  -purewick if needed to keep skin dry, but mostly retaining right now 14. Sleep-/insomnia- pt took 30, not 3 mg of Melatonin nightly at home  AND 3+ pills of ZZZquil at home to sleep nightly per daughter- Juluis Rainier  8/28: daughter is concerned regarding sleep apnea. Would like outpatient sleep study.   Sleep  Graph  15.  Constipation small daily BM will check KUB, schedule senokot, (not senna S due to mushy stools), may cause some GI discomfort, Already on Protonix given history of alcohol abuse with potential for gastritis LOS: 16 days A FACE TO FACE EVALUATION WAS PERFORMED  Aminta Sakurai 09/23/2019, 3:51 PM

## 2019-09-23 NOTE — Progress Notes (Signed)
Referring Physician(s): Dr. Letta Pate  Supervising Physician: Sandi Mariscal  Patient Status:  [facility] - In-pt  Chief Complaint:  CVA with ongoing dysphagia s/p gastrostomy tube placement on 9.3.21 by Dr. Shellia Cleverly  Subjective:  86 y.o, female inpatient. History of CVA s/p cerebral arteriogram with emergent thrombectomy of left MCA MA occlusion with residual right sided weakness and dysphagia. The Patient is  admitted for inpatient rehab and ongoing therapy. Due to ongoing dysphagia IR placed  A 20 Fr gastrostomy tube on  9.3.21. The procedure was performed by Dr. Shellia Cleverly.  Patient and alert and will nod and shake head to questions. Daughter at bedside. Patient denies (shakes her head) when asked about abdominal pain. Appropriate tenderness with palpation. RN at bedside states that they gastrostomy tube has been used for medication administration without issue.  Allergies: Codeine  Medications: Prior to Admission medications   Medication Sig Start Date End Date Taking? Authorizing Provider  acetaminophen (TYLENOL) 160 MG/5ML solution Place 20.3 mLs (650 mg total) into feeding tube every 4 (four) hours as needed for mild pain (or temp > 37.5 C (99.5 F)). 09/07/19   Donzetta Starch, NP  amantadine (SYMMETREL) 50 MG/5ML solution Place 10 mLs (100 mg total) into feeding tube daily. 09/08/19   Donzetta Starch, NP  amLODipine (NORVASC) 10 MG tablet Place 1 tablet (10 mg total) into feeding tube daily. 09/08/19   Donzetta Starch, NP  aspirin 81 MG chewable tablet Place 1 tablet (81 mg total) into feeding tube daily. 09/08/19   Donzetta Starch, NP  atorvastatin (LIPITOR) 40 MG tablet Place 1 tablet (40 mg total) into feeding tube daily. 09/08/19   Donzetta Starch, NP  chlorhexidine gluconate, MEDLINE KIT, (PERIDEX) 0.12 % solution 15 mLs by Mouth Rinse route 2 (two) times daily. 09/07/19   Donzetta Starch, NP  donepezil (ARICEPT) 10 MG tablet Place 1 tablet (10 mg total) into feeding tube at  bedtime. 09/07/19   Donzetta Starch, NP  glycopyrrolate (ROBINUL) 0.2 MG/ML injection Inject 0.5 mLs (0.1 mg total) into the vein every 4 (four) hours as needed (for excessive secretions). 09/07/19   Donzetta Starch, NP  latanoprost (XALATAN) 0.005 % ophthalmic solution Place 1 drop into both eyes at bedtime. 09/07/19   Donzetta Starch, NP  losartan (COZAAR) 25 MG tablet Place 1 tablet (25 mg total) into feeding tube daily. 09/08/19   Donzetta Starch, NP  metoprolol tartrate (LOPRESSOR) 25 mg/10 mL SUSP Place 20 mLs (50 mg total) into feeding tube 2 (two) times daily. 09/07/19   Donzetta Starch, NP  Multiple Vitamin (MULTIVITAMIN) LIQD Place 15 mLs into feeding tube daily. 09/08/19   Donzetta Starch, NP  Nutritional Supplements (FEEDING SUPPLEMENT, OSMOLITE 1.2 CAL,) LIQD Place 1,000 mLs into feeding tube continuous. 09/07/19   Donzetta Starch, NP  Nutritional Supplements (FEEDING SUPPLEMENT, PROSOURCE TF,) liquid Place 45 mLs into feeding tube 2 (two) times daily. 09/07/19   Donzetta Starch, NP  pantoprazole sodium (PROTONIX) 40 mg/20 mL PACK Place 20 mLs (40 mg total) into feeding tube at bedtime. 09/07/19   Donzetta Starch, NP  senna-docusate (SENOKOT-S) 8.6-50 MG tablet Take 1 tablet by mouth at bedtime as needed for mild constipation. 09/07/19   Donzetta Starch, NP  sertraline (ZOLOFT) 50 MG tablet Place 1 tablet (50 mg total) into feeding tube daily. 09/08/19   Donzetta Starch, NP  sodium chloride 0.9 % infusion Inject 50 mLs into the  vein continuous. 09/07/19   Donzetta Starch, NP  timolol (TIMOPTIC) 0.5 % ophthalmic solution Place 1 drop into both eyes daily.    [provider]  triamcinolone lotion (KENALOG) 0.1 % Apply 1 application topically daily as needed (itching).    [provider]  vitamin B-12 1000 MCG tablet Place 3 tablets (3,000 mcg total) into feeding tube daily. 09/08/19   Donzetta Starch, NP     Vital Signs: BP 114/61 (BP Location: Left Arm)   Pulse 94   Temp (!) 97.5 F (36.4  C) (Axillary)   Resp 16   Ht $R'5\' 5"'mC$  (1.651 m)   Wt 178 lb 5.6 oz (80.9 kg)   SpO2 98%   BMI 29.68 kg/m   Physical Exam Vitals and nursing note reviewed.  Constitutional:      Appearance: She is well-developed.  HENT:     Head: Normocephalic and atraumatic.  Eyes:     Conjunctiva/sclera: Conjunctivae normal.  Pulmonary:     Effort: Pulmonary effort is normal.  Abdominal:     Comments: Insertion site is clean, dry, dressed and appropriately tender to palpation. Tube is to capped,  no bleeding or leakage.   Musculoskeletal:        General: Normal range of motion.     Cervical back: Normal range of motion.  Skin:    General: Skin is warm.  Neurological:     Mental Status: She is alert.     Imaging: CT ABDOMEN WO CONTRAST  Result Date: 09/21/2019 CLINICAL DATA:  History of left MCA infarct. Evaluate anatomy for gastrostomy tube placement. EXAM: CT ABDOMEN WITHOUT CONTRAST TECHNIQUE: Multidetector CT imaging of the abdomen was performed following the standard protocol without IV contrast. COMPARISON:  05/12/2016 FINDINGS: Lower chest: Lung bases are clear. Hepatobiliary: Normal appearance of the liver and gallbladder. Pancreas: Unremarkable. No pancreatic ductal dilatation or surrounding inflammatory changes. Spleen: Normal in size without focal abnormality. Adrenals/Urinary Tract: Normal appearance of the adrenal glands. Kidneys have a lobulated appearance without hydronephrosis. Probable tiny hyperdense cyst along the right kidney upper pole and similar to the previous examination. Stomach/Bowel: Nasogastric tube extends into the distal stomach. Stomach is in a normal location. There is no bowel positioned between the stomach in the anterior abdominal wall. The transverse colon is caudal to the stomach and incompletely imaged. Scattered colonic diverticula. No evidence for small bowel dilatation. Vascular/Lymphatic: Abdominal aorta is heavily calcified without aneurysm. Coronary artery  calcifications. No lymph node enlargement in the abdomen. Other: Negative for ascites. Musculoskeletal: Again noted is interbody fusion at L2, L3 and L4. Plate and screw fixation along the left side of the surgically fused vertebral bodies. Stable compression deformity at L1. Disc space loss at L1-L2. IMPRESSION: 1. Anatomy is amendable for percutaneous gastrostomy tube placement. 2.  Aortic Atherosclerosis (ICD10-I70.0). Electronically Signed   By: Markus Daft M.D.   On: 09/21/2019 11:04   DG Chest 2 View  Result Date: 09/19/2019 CLINICAL DATA:  Cough. EXAM: CHEST - 2 VIEW COMPARISON:  09/06/2019. FINDINGS: Feeding tube noted in stable position. Heart size stable. Low lung volumes. Mild bibasilar subsegmental atelectasis. Right base infiltrate cannot be excluded. Tiny right pleural effusion. No pneumothorax. Degenerative changes thoracic spine. IMPRESSION: 1.  Feeding tube noted in stable position. 2. Low lung volumes with mild bibasilar subsegmental atelectasis. Mild right base infiltrate cannot be excluded. Electronically Signed   By: [place]   On: 09/19/2019 14:05   IR GASTROSTOMY TUBE MOD SED  Result Date:  09/22/2019 INDICATION: 86 year old female with dysphagia. She requires placement of a percutaneous gastrostomy tube for nutrition. EXAM: Fluoroscopically guided placement of percutaneous pull-through gastrostomy tube Interventional Radiologist:  Criselda Peaches, MD MEDICATIONS: 2 g Ancef, 1 mg glucagon; Antibiotics were administered within 1 hour of the procedure. ANESTHESIA/SEDATION: Versed 0.5 mg IV; Fentanyl 25 mcg IV Moderate Sedation Time:  14 minutes The patient was continuously monitored during the procedure by the interventional radiology nurse under my direct supervision. CONTRAST:  25mL OMNIPAQUE IOHEXOL 300 MG/ML  SOLN FLUOROSCOPY TIME:  Fluoroscopy Time: 7 minutes 42 seconds (33 mGy). COMPLICATIONS: None immediate. PROCEDURE: Informed written consent was obtained from the  patient after a thorough discussion of the procedural risks, benefits and alternatives. All questions were addressed. Maximal Sterile Barrier Technique was utilized including caps, mask, sterile gowns, sterile gloves, sterile drape, hand hygiene and skin antiseptic. A timeout was performed prior to the initiation of the procedure. Maximal barrier sterile technique utilized including caps, mask, sterile gowns, sterile gloves, large sterile drape, hand hygiene, and chlorhexadine skin prep. An angled catheter was advanced over a wire under fluoroscopic guidance through the nose, down the esophagus and into the body of the stomach. The stomach was then insufflated with several 100 ml of air. Fluoroscopy confirmed location of the gastric bubble, as well as inferior displacement of the barium stained colon. Under direct fluoroscopic guidance, a single T-tack was placed, and the anterior gastric wall drawn up against the anterior abdominal wall. Percutaneous access was then obtained into the mid gastric body with an 18 gauge sheath needle. Aspiration of air, and injection of contrast material under fluoroscopy confirmed needle placement. An Amplatz wire was advanced in the gastric body and the access needle exchanged for a 9-French vascular sheath. A snare device was advanced through the vascular sheath and an Amplatz wire advanced through the angled catheter. The Amplatz wire was successfully snared and this was pulled up through the esophagus and out the mouth. A 20-French Alinda Dooms MIC-PEG tube was then connected to the snare and pulled through the mouth, down the esophagus, into the stomach and out to the anterior abdominal wall. Hand injection of contrast material confirmed intragastric location. The T-tack retention suture was then cut. The pull through peg tube was then secured with the external bumper and capped. The patient will be observed for several hours with the newly placed tube on low wall suction to  evaluate for any post procedure complication. The patient tolerated the procedure well, there is no immediate complication. IMPRESSION: Successful placement of a 20 French pull through gastrostomy tube. Electronically Signed   By: Jacqulynn Cadet M.D.   On: 09/22/2019 12:48    Labs:  CBC: Recent Labs    09/06/19 0157 09/07/19 0215 09/08/19 0853 09/19/19 1115  WBC 8.7 8.5 10.7* 8.0  HGB 12.7 12.5 13.1 13.8  HCT 37.2 37.3 38.8 42.2  PLT 210 225 258 406*    COAGS: Recent Labs    09/02/19 1130  INR 1.0  APTT 34    BMP: Recent Labs    09/06/19 0157 09/07/19 0215 09/08/19 0853 09/13/19 0617  NA 131* 131* 132* 131*  K 3.7 4.0 4.4 4.9  CL 96* 96* 95* 95*  CO2 $Re'26 25 26 27  'zxE$ GLUCOSE 141* 142* 149* 113*  BUN $Re'19 21 21 'aBb$ 26*  CALCIUM 9.1 9.0 9.2 9.3  CREATININE 0.64 0.66 0.76 0.77  GFRNONAA >60 >60 >60 >60  GFRAA >60 >60 >60 >60    LIVER FUNCTION TESTS:  Recent Labs    09/02/19 1130 09/08/19 0853  BILITOT 1.0 0.7  AST 25 20  ALT 15 23  ALKPHOS 63 72  PROT 7.2 6.2*  ALBUMIN 4.2 3.1*    Assessment and Plan:  86 y.o, female inpatient. History of CVA s/p cerebral arteriogram with emergent thrombectomy of left MCA MA occlusion with residual right sided weakness and dysphagia. The Patient is currently admitted for inpatient rehab and ongoing therapy. Due to ongoing dysphagia IR placed a 20 Fr gastrostomy tube on  9.3.21. The procedure was performed by Dr. Shellia Cleverly.   Insertion site is clean, dry, dressed and appropriately tender to palpation. Tube is capped on exam. No bleeding or leaking noted. Per staff tube has  Been used for medication administration without issue.   No pertinent labs since gastrostomy tube placement. All medications are within acceptable parameters.    Ok to begin using g-tube for medicines, tube feeds, free water, etc.  Please call IR for any questions or concerns regarding the g-tube.     Electronically Signed: Jacqualine Mau,  NP 09/23/2019, 9:52 AM   I spent a total of 15 Minutes at the patient's bedside AND on the patient's hospital floor or unit, greater than 50% of which was counseling/coordinating care for gastrostomy tube placement

## 2019-09-23 NOTE — Progress Notes (Signed)
Speech Language Pathology Daily Session Note  Patient Details  Name: Tiphanie LAKRISTA SCADUTO MRN: [id number] Date of Birth: 07/05/1932  Today's Date: 09/23/2019 SLP Individual Time: 1300-1330 SLP Individual Time Calculation (min): 30 min  Short Term Goals: Week 3: SLP Short Term Goal 1 (Week 3): Pt will consume current diet with Mod A verbal/visual cues for implementation of compensatory swallow strategies for oral clearance and minimize aspiration risk. SLP Short Term Goal 2 (Week 3): Pt will demonstrate efficient mastication and oral clearance of Dys 2 solid trials a minimum of 2 times prior to diet advancement. SLP Short Term Goal 3 (Week 3): Pt will communicate basic wants and needs at the word level with Mod A verbal and/or visual cues. SLP Short Term Goal 4 (Week 3): Pt will name common objects with 80%  accuracy provided Mod A multimodal cues. SLP Short Term Goal 5 (Week 3): Pt will match words to common objects with 60% accuracy with Max A multimodal cues. SLP Short Term Goal 6 (Week 3): Pt will demonstrate awareness of functional and/or verbal errors with Max A multimodal cues.  Skilled Therapeutic Interventions: Skilled treatment session focused on dysphagia goals and family education with the patient's daughter Cyndy Freeze. SLP facilitated session by providing overall Min A verbal cues for use of small bites/sips and problem solving with lunch meal of Dys. 1 textures with nectar-thick liquids. Patient consumed meal with one overt weak cough X 1 that appeared to tike extra time to clear. Recommend patient continue current diet. Patient's daughter educated regarding appropriate textures, diet recommendations, swallowing compensatory strategies and overt s/s of aspiration. She verbalized understanding and provided appropriate cues, therefore, SLP signed off [place] to provide supervision. Patient handed off to OT. Continue with current plan of care.       Pain No/Denies Pain   Therapy/Group:  Individual Therapy  Aaren Krog 09/23/2019, 2:36 PM

## 2019-09-23 NOTE — Progress Notes (Signed)
Physical Therapy Session Note  Patient Details  Name: [name] BURLING MRN: [id number] Date of Birth: 01-17-1933  Today's Date: 09/23/2019 PT Individual Time: 7124-5809 PT Individual Time Calculation (min): 71 min   Short Term Goals: Week 3:  PT Short Term Goal 1 (Week 3): STG=LTG due to ELOS with d/c to SNF  Skilled Therapeutic Interventions/Progress Updates:    Pt received supine in bed, awake with her daughter present - pt agreeable to therapy session via "yes" head nod. Supine in bed therapist thread pants on LEs with total assist for R and max assist for L - therapist positioned R LE into hooklying position and pt able to minimally clear hips for therapist to pull pants up max assist (pt using L hand to assist). Supine>sitting R EOB, HOB partially elevated, with max assist primarily for trunk upright due to strong R lateral trunk lean - cuing to reach with L hand to foot of bed to improve upright, midline orientation. Sitting EOB using L UE support for trunk control donned shoes max assist. L squat pivot EOB>w/c with max assist for lifting/pivoting hips and cuing for sequencing and head/hips relationship - therapist manually placing R LE and facilitating weightbearing.  Transported to/from gym in w/c for time management and energy conservation. Block practice squat pivot transfers w/c<>EOM x3 reps each with max assist each time for lifting/pivoting hips and trunk control - therapist focusing on improved motor planning and increasing pt independence with this task - required repeated cuing for proper L hand placement, total assist for R hemibody placement, and cuing with manual assist for head/hips relationship and trunk control while pivoting - pt demos improving hip clearance with increased practice. Sit<>stands x3 at L hallway rail with mirror feedback - max assist for lifting and balance while therapist blocking R knee - manual facilitation for improved upright trunk posture with assist for  increased trunk/hip/knee extension - pt initially demonstrates moderate pushing to R but improved after 1st stand with cuing to move hand forward on handrail - attempted to progress to pre-gait training via L LE forward/backwards stepping to target R LE stance control but pt with poor motor planning and picking up L foot only 2x - +2 assist present throughout this for safety. R squat pivot to EOM max assist as described above. Sitting EOM performed dynamic trunk control/sitting balance, L attention/visual scanning, and cognitive task of locating specified numbered disks on command then grabbing with R hand - pt demos ability to reach slightly R outside BOS and recover back to midline with only manual facilitation for R UE WBing positioning, with fatigue had worsening R lateral trunk lean requiring mod assist for midline orientation. After performing sitting balance ~16minutes pt looked flushed and started minimally sweating on face - with questioning pt appearing to feel nauseous but difficult to determine as her yes/no head nod responses may not be reliable - provided cool wash clothe and transported back to room. Caryl Pina, RN notified and therapist assessed vitals: BP 123/71 (MAP 84), HR 84bpm, SpO2 97%. Pt requests to remain up in wheelchair and left with needs in reach, her daughter present, and RN assuming care of patient.  Therapy Documentation Precautions:  Precautions Precautions: Fall Precaution Comments: corerack; R inattention, peg tube placement on 9/2 Restrictions Weight Bearing Restrictions: No  Pain:   Difficult to determine due to questionable reliability of yes/no head nods but appears to have R LE pain during weightbearing tasks - provided repositioning and seated rest breaks for pain management.  Therapy/Group: Individual Therapy  Tawana Scale , PT, DPT, CSRS  09/23/2019, 6:21 PM

## 2019-09-24 ENCOUNTER — Inpatient Hospital Stay (HOSPITAL_COMMUNITY): Payer: Medicare Other | Admitting: Physical Therapy

## 2019-09-24 ENCOUNTER — Inpatient Hospital Stay (HOSPITAL_COMMUNITY): Payer: Medicare Other

## 2019-09-24 LAB — SARS CORONAVIRUS 2 (TAT 6-24 HRS): SARS Coronavirus 2: NEGATIVE

## 2019-09-24 MED ORDER — LIDOCAINE 5 % EX PTCH
2.0000 | MEDICATED_PATCH | CUTANEOUS | Status: DC
  Administered 2019-09-24 – 2019-09-25 (×2): 2 via TRANSDERMAL
  Filled 2019-09-24 (×2): qty 2

## 2019-09-24 MED ORDER — AMANTADINE HCL 50 MG/5ML PO SYRP
200.0000 mg | ORAL_SOLUTION | Freq: Every day | ORAL | Status: DC
  Administered 2019-09-25 – 2019-09-26 (×2): 200 mg
  Filled 2019-09-24 (×3): qty 20

## 2019-09-24 NOTE — Progress Notes (Signed)
Occupational Therapy Session Note  Patient Details  Name: [name] MRN: [id number] Date of Birth: 1933/01/13  Today's Date: 09/24/2019 OT Individual Time: 0370-4888 OT Individual Time Calculation (min): 28 min    Short Term Goals: Week 1:  OT Short Term Goal 1 (Week 1): Pt will sit EOB statically for ~5 min with supervison in prep for ADL task OT Short Term Goal 1 - Progress (Week 1): Progressing toward goal OT Short Term Goal 2 (Week 1): Pt will attend to familar ADL task demonstrating sustained attention for ~3 min with min cues OT Short Term Goal 2 - Progress (Week 1): Progressing toward goal OT Short Term Goal 3 (Week 1): Pt will don shirt with mod A OT Short Term Goal 3 - Progress (Week 1): Progressing toward goal OT Short Term Goal 4 (Week 1): Pt will in bed with mod A in prep for aDL tasks OT Short Term Goal 4 - Progress (Week 1): Progressing toward goal  Skilled Therapeutic Interventions/Progress Updates:    1:1. Pt received in bed, agreeable to OT. Pt squat pivot transfers with MAX A of 1 using bobath technique to L to encourage headhips relationship and lateral scoots along EOM in prep for transfers with MAX decreasing to MOD A. Pt sits EOM initially with S- CGA leaning laterally to R only forearm/elbow<>sitting up to midline (mirror for visual feedback) with CGA-MIN A and MOD-MAX A for L elbow taps on 6" block on EOM to activate R obliques. Pt completes lateral/anterior/posterior reaching for cones on L in mod-max ranges outside BOS and places with MAX VC on mat R of RUE for R attention and to break up pushing tendencies. Exited session with pt seated in bed, exit alarm on and call lig htin reach Therapy Documentation Precautions:  Precautions Precautions: Fall Precaution Comments: corerack; R inattention, peg tube placement on 9/2 Restrictions Weight Bearing Restrictions: No General:   Vital Signs: Therapy Vitals Temp: 97.8 F (36.6 C) Pulse Rate: 85 Resp: 18 BP:  134/72 Patient Position (if appropriate): Sitting Oxygen Therapy SpO2: 98 % O2 Device: Room Air Pain:   ADL: ADL Eating: NPO Grooming: Dependent Upper Body Bathing: Dependent Lower Body Bathing: Dependent Upper Body Dressing: Dependent Lower Body Dressing: Dependent Toileting: Dependent Toilet Transfer: Not assessed Tub/Shower Transfer: Not assessed Vision   Perception    Praxis   Exercises:   Other Treatments:     Therapy/Group: Individual Therapy  Tonny Branch 09/24/2019, 3:43 PM

## 2019-09-24 NOTE — Progress Notes (Signed)
Physical Therapy Session Note  Patient Details  Name: [name] MRN: [id number] Date of Birth: 07/11/1932  Today's Date: 09/24/2019 PT Individual Time: 1300-1355 PT Individual Time Calculation (min): 55 min   Short Term Goals: Week 3:  PT Short Term Goal 1 (Week 3): STG=LTG due to ELOS with d/c to SNF  Skilled Therapeutic Interventions/Progress Updates:    Pt received seated in bed having just finished lunch with daughter present, agreeable to participate in therapy session. Pt indicates some pain in LLE and RLE at times, lidocaine patches in place to L hip and pt unable to rate pain or further describe. Pt indicating she needs to use the bathroom. Rolling L/R with max A for placement of bedpan. Pt unable to void on bedpan and keeps coming up into bridging position in attempt to void. Pt agreeable to try [facility] for more natural position for voiding. Supine to sit with assist x 2 for BLE management and trunk control. Pt with R lateral lean and pushing to the R in sitting. Squat pivot transfer with max to total A x 2 to the L with use of LUE on BSC armrest. Pt able to continently void while seated on BSC (see Flowsheet for details). Sit to stand with assist x 2 to stedy. While in standing dependent pericare performed. Pt with significant R lateral lean in standing and unable to correct with max multimodal cueing. Assist x 3 with stedy to safely return to sitting EOB. Pt returned to supine with assist x 2 for trunk control and BLE management. Assisted pt with donning new brief and pants at bed level dependently. Returned to sitting EOB with assist x 2. Squat pivot transfer to TIS with total A x 2 due to significant pushing to the R. Once seated in TIS adjusted leg rests for improved comfort and provided padding to prevent skin breakdown. Pt left semi-reclined in TIS w/c in room with needs in reach, quick release belt and chair alarm in place at end of session.  Therapy Documentation Precautions:   Precautions Precautions: Fall Precaution Comments: corerack; R inattention, peg tube placement on 9/2 Restrictions Weight Bearing Restrictions: No    Therapy/Group: Individual Therapy   Excell Seltzer, PT, DPT  09/24/2019, 1:56 PM

## 2019-09-24 NOTE — Discharge Summary (Addendum)
Physician Discharge Summary  Patient ID: [name] MRN: [id number] DOB/AGE: 1932/04/13 86 y.o.  Admit date: 09/07/2019 Discharge date: 09/26/2019  Discharge Diagnoses:  Principal Problem:   Left middle cerebral artery stroke [facility]) Active Problems:   Atrial fibrillation (HCC)   Mild cognitive impairment   HTN (hypertension)   Urinary retention DVT prophylaxis Dysphagia/status post gastrostomy tube 09/22/2019 Hyperlipidemia History of alcohol use Atrial fibrillation Urinary retention/Proteus UTI History of subdural hematoma  Discharged Condition: Stable  Significant Diagnostic Studies: CT ABDOMEN WO CONTRAST  Result Date: 09/21/2019 CLINICAL DATA:  History of left MCA infarct. Evaluate anatomy for gastrostomy tube placement. EXAM: CT ABDOMEN WITHOUT CONTRAST TECHNIQUE: Multidetector CT imaging of the abdomen was performed following the standard protocol without IV contrast. COMPARISON:  05/12/2016 FINDINGS: Lower chest: Lung bases are clear. Hepatobiliary: Normal appearance of the liver and gallbladder. Pancreas: Unremarkable. No pancreatic ductal dilatation or surrounding inflammatory changes. Spleen: Normal in size without focal abnormality. Adrenals/Urinary Tract: Normal appearance of the adrenal glands. Kidneys have a lobulated appearance without hydronephrosis. Probable tiny hyperdense cyst along the right kidney upper pole and similar to the previous examination. Stomach/Bowel: Nasogastric tube extends into the distal stomach. Stomach is in a normal location. There is no bowel positioned between the stomach in the anterior abdominal wall. The transverse colon is caudal to the stomach and incompletely imaged. Scattered colonic diverticula. No evidence for small bowel dilatation. Vascular/Lymphatic: Abdominal aorta is heavily calcified without aneurysm. Coronary artery calcifications. No lymph node enlargement in the abdomen. Other: Negative for ascites. Musculoskeletal: Again noted is  interbody fusion at L2, L3 and L4. Plate and screw fixation along the left side of the surgically fused vertebral bodies. Stable compression deformity at L1. Disc space loss at L1-L2. IMPRESSION: 1. Anatomy is amendable for percutaneous gastrostomy tube placement. 2.  Aortic Atherosclerosis (ICD10-I70.0). Electronically Signed   By: Markus Daft M.D.   On: 09/21/2019 11:04   DG Chest 2 View  Result Date: 09/19/2019 CLINICAL DATA:  Cough. EXAM: CHEST - 2 VIEW COMPARISON:  09/06/2019. FINDINGS: Feeding tube noted in stable position. Heart size stable. Low lung volumes. Mild bibasilar subsegmental atelectasis. Right base infiltrate cannot be excluded. Tiny right pleural effusion. No pneumothorax. Degenerative changes thoracic spine. IMPRESSION: 1.  Feeding tube noted in stable position. 2. Low lung volumes with mild bibasilar subsegmental atelectasis. Mild right base infiltrate cannot be excluded. Electronically Signed   By: Marcello Moores  Register   On: 09/19/2019 14:05   DG Abd 1 View  Result Date: 09/18/2019 CLINICAL DATA:  Evaluate feeding tube. EXAM: ABDOMEN - 1 VIEW COMPARISON:  09/03/2019 and prior studies FINDINGS: A small bore feeding tube is noted with tip at the pylorus. No dilated bowel loops are noted. Colonic diverticulosis identified. Prior surgical fusion changes again noted. IMPRESSION: Small bore feeding tube with tip at the pylorus. Electronically Signed   By: Margarette Canada M.D.   On: 09/18/2019 12:28   CT HEAD WO CONTRAST  Result Date: 09/06/2019 CLINICAL DATA:  Stroke post left MCA thrombectomy and recanalization 09/04/2019. Follow-up intracranial hemorrhage. EXAM: CT HEAD WITHOUT CONTRAST TECHNIQUE: Contiguous axial images were obtained from the base of the skull through the vertex without intravenous contrast. COMPARISON:  CT head 09/05/2019 FINDINGS: Brain: Small amount of hemorrhage in the left putamen with associated surrounding low-density infarct. Low density in the left putamen compatible  with acute infarct also unchanged. Small amount of hemorrhage in the left sylvian fissure similar to the prior study. Hemorrhage in the left superior anterior  temporal lobe unchanged. No new areas of hemorrhage Moderate atrophy. Chronic microvascular ischemic changes in the white matter. Negative for hydrocephalus Vascular: Negative for hyperdense vessel Skull: 2 burr holes in the right parietal bone. No acute skeletal abnormality Sinuses/Orbits: Chronic depressed fracture left orbital floor unchanged. Bilateral proptosis without orbital mass. Mild mucosal edema paranasal sinuses. Air-fluid level sphenoid sinus. Other: None IMPRESSION: Stable CT without evidence of new hemorrhage Hemorrhagic infarction left putamen stable. Small amount of hemorrhage in the left sylvian fissure and left superior temporal lobe anteriorly stable. Electronically Signed   By: Franchot Gallo M.D.   On: 09/06/2019 13:59   CT Head Wo Contrast  Result Date: 09/05/2019 CLINICAL DATA:  Stroke follow-up.  Subdural hematoma history. EXAM: CT HEAD WITHOUT CONTRAST TECHNIQUE: Contiguous axial images were obtained from the base of the skull through the vertex without intravenous contrast. COMPARISON:  Brain MRI from 2 days ago FINDINGS: Brain: Essentially stable subarachnoid blood along the lower left sylvian fissure and adjacent sulci. Hazy hemorrhage at the left putamen is also non progressed. Cytotoxic edema is progressively apparent at the left basal ganglia and adjacent white matter tracks, expected. No interval hemorrhage, hydrocephalus, or masslike finding. Brain atrophy. Vascular: Streak artifact with no detected hyperdense vessel. Skull: Remote burr holes on the right.  No acute finding Sinuses/Orbits: Negative IMPRESSION: Left basal ganglia and inferior division infarcts without detected progression. Unchanged petechial hemorrhage at the basal ganglia and subarachnoid hemorrhage along the sylvian fissure. Electronically Signed   By:  Monte Fantasia M.D.   On: 09/05/2019 07:35   CT HEAD WO CONTRAST  Result Date: 09/03/2019 CLINICAL DATA:  Left MCA stroke post thrombectomy EXAM: CT HEAD WITHOUT CONTRAST TECHNIQUE: Contiguous axial images were obtained from the base of the skull through the vertex without intravenous contrast. COMPARISON:  09/02/2019 FINDINGS: Brain: There is new hyperdensity of the left basal ganglia. There is also new hyperdensity about the left MCA and left sylvian fissure. No new loss of gray-white differentiation. Ventricles are stable in size. Stable findings of advanced chronic microvascular ischemic changes. No extra-axial fluid collection. No significant mass effect. Vascular: Left MCA is not well evaluated but does not appear as dense. Skull: Right burr holes.  Otherwise unremarkable. Sinuses/Orbits: Nonspecific paranasal sinus mucosal thickening. No acute orbital finding. Other: None. IMPRESSION: New hyperdensity of the previously hypodense left basal ganglia may reflect petechial reperfusion hemorrhage or contrast staining. Hyperdensity along the left MCA and sylvian fissure likely reflects small volume subarachnoid hemorrhage. Electronically Signed   By: Macy Mis M.D.   On: 09/03/2019 09:31   MR BRAIN WO CONTRAST  Result Date: 09/03/2019 CLINICAL DATA:  Left MCA stroke post thrombectomy EXAM: MRI HEAD WITHOUT CONTRAST TECHNIQUE: Multiplanar, multiecho pulse sequences of the brain and surrounding structures were obtained without intravenous contrast. COMPARISON:  Recent CT imaging, MRI brain 2018 FINDINGS: Brain: There is reduced diffusion involving the left basal ganglia and adjacent white matter, a small area within the anteromedial left temporal lobe, and small areas of the parietal and posterior left temporal lobes. There is corresponding susceptibility primarily in the basal ganglia region reflecting petechial reperfusion hemorrhage. Diffusion hyperintensity and susceptibility along the left MCA  cistern and sylvian fissure are compatible with subarachnoid hemorrhage. There is trace layering intraventricular hemorrhage in the occipital horns likely reflecting redistribution of the subarachnoid hemorrhage. Prominence of the ventricles and sulci reflects generalized parenchymal volume loss. Confluent areas of T2 hyperintensity in the supratentorial greater than pontine white matter are nonspecific but probably reflect  advanced chronic microvascular ischemic changes. There is no significant mass effect. Vascular: Major vessel flow voids at the skull base are preserved. Skull and upper cervical spine: Normal marrow signal is preserved. Sinuses/Orbits: Mild mucosal thickening. Bilateral lens replacements. Other: Sella is unremarkable.  Mastoid air cells are clear. IMPRESSION: Acute infarcts of the left MCA territory involving the basal ganglia and adjacent white matter and parietotemporal lobes. Petechial reperfusion hemorrhage primarily involving the basal ganglia and small volume subarachnoid hemorrhage as seen on CT. Advanced chronic microvascular ischemic changes. Electronically Signed   By: Macy Mis M.D.   On: 09/03/2019 14:35   IR GASTROSTOMY TUBE MOD SED  Result Date: 09/22/2019 INDICATION: 86 year old female with dysphagia. She requires placement of a percutaneous gastrostomy tube for nutrition. EXAM: Fluoroscopically guided placement of percutaneous pull-through gastrostomy tube Interventional Radiologist:  Criselda Peaches, MD MEDICATIONS: 2 g Ancef, 1 mg glucagon; Antibiotics were administered within 1 hour of the procedure. ANESTHESIA/SEDATION: Versed 0.5 mg IV; Fentanyl 25 mcg IV Moderate Sedation Time:  14 minutes The patient was continuously monitored during the procedure by the interventional radiology nurse under my direct supervision. CONTRAST:  20m OMNIPAQUE IOHEXOL 300 MG/ML  SOLN FLUOROSCOPY TIME:  Fluoroscopy Time: 7 minutes 42 seconds (33 mGy). COMPLICATIONS: None immediate.  PROCEDURE: Informed written consent was obtained from the patient after a thorough discussion of the procedural risks, benefits and alternatives. All questions were addressed. Maximal Sterile Barrier Technique was utilized including caps, mask, sterile gowns, sterile gloves, sterile drape, hand hygiene and skin antiseptic. A timeout was performed prior to the initiation of the procedure. Maximal barrier sterile technique utilized including caps, mask, sterile gowns, sterile gloves, large sterile drape, hand hygiene, and chlorhexadine skin prep. An angled catheter was advanced over a wire under fluoroscopic guidance through the nose, down the esophagus and into the body of the stomach. The stomach was then insufflated with several 100 ml of air. Fluoroscopy confirmed location of the gastric bubble, as well as inferior displacement of the barium stained colon. Under direct fluoroscopic guidance, a single T-tack was placed, and the anterior gastric wall drawn up against the anterior abdominal wall. Percutaneous access was then obtained into the mid gastric body with an 18 gauge sheath needle. Aspiration of air, and injection of contrast material under fluoroscopy confirmed needle placement. An Amplatz wire was advanced in the gastric body and the access needle exchanged for a 9-French vascular sheath. A snare device was advanced through the vascular sheath and an Amplatz wire advanced through the angled catheter. The Amplatz wire was successfully snared and this was pulled up through the esophagus and out the mouth. A 20-French KAlinda DoomsMIC-PEG tube was then connected to the snare and pulled through the mouth, down the esophagus, into the stomach and out to the anterior abdominal wall. Hand injection of contrast material confirmed intragastric location. The T-tack retention suture was then cut. The pull through peg tube was then secured with the external bumper and capped. The patient will be observed for several  hours with the newly placed tube on low wall suction to evaluate for any post procedure complication. The patient tolerated the procedure well, there is no immediate complication. IMPRESSION: Successful placement of a 20 French pull through gastrostomy tube. Electronically Signed   By: HJacqulynn CadetM.D.   On: 09/22/2019 12:48   IR CT Head Ltd  Result Date: 09/04/2019 INDICATION: 86year old female with past medical history significant for atrial fibrillation (not on anticoagulation due to prior subdural hematoma),  hypertension, hyperlipidemia, heart disease, mild cognitive impairment of vascular etiology complicated by alcohol use with a premorbid Rankin scale of 0. She presented to ED with aphasia, left gaze deviation and nonrhythmic shaking of the left side, NIHSS 22. She was last known well at 2:30 p.m. on 09/01/2019. No IV tPA administered as patient was outside of the window. Head CT showed prominent white matter disease and atrophy with hyperdense left MCA without large acute infarct or hemorrhage. CT angiogram of the head and neck showed a proximal left M1/MCA occlusion extending into the M2 segment. CT perfusion showed a core infarct of 4 mL with a penumbra of 83 mL. She was then transferred to our service for a diagnostic cerebral angiogram and emergency mechanical thrombectomy. EXAM: DIAGNOSTIC CEREBRAL ANGIOGRAM MECHANICAL THROMBECTOMY FLAT PANEL HEAD CT COMPARISON:  CT/CT angiogram of the head and neck September 02, 2019 MEDICATIONS: No antibiotics given. ANESTHESIA/SEDATION: The procedure was performed in the general anesthesia. FLUOROSCOPY TIME:  Fluoroscopy Time: 31 minutes 12 seconds (678 mGy). COMPLICATIONS: None immediate. TECHNIQUE: Informed written consent was obtained from the patient's daughter after a thorough discussion of the procedural risks, benefits and alternatives. All questions were addressed. Maximal Sterile Barrier Technique was utilized including caps, mask, sterile gowns,  sterile gloves, sterile drape, hand hygiene and skin antiseptic. A timeout was performed prior to the initiation of the procedure. The right groin was prepped and draped in the usual sterile fashion. Using a micropuncture kit and the modified Seldinger technique, access was gained to the right common femoral artery and an 8 French sheath was placed. Under fluoroscopy, an 8 [place] Walrus balloon guide catheter was navigated over a 6 [place] VTK catheter and a 0.035" Terumo Glidewire into the aortic arch. The catheter was placed into the left common carotid artery and then advanced into the left internal carotid artery. The inner catheter was removed. Frontal and lateral angiograms of the head were obtained. FINDINGS: Occlusion of the left middle cerebral artery at its origin. Fair collateral flow is seen from the left ACA to the left MCA vascular tree. PROCEDURE: Under biplane roadmap, a zoom 71 aspiration catheter was navigated over a phenom 21 microcatheter and a synchro support microguidewire into the cavernous segment of the right ICA. The microcatheter was then navigated over the wire into the left M2/MCA inferior division branch. Then, a 5 x 37 mm embotrap stent retriever was deployed spanning the M1 and M2 segments. The device was allowed to intercalated with the clot for 4 minutes. The microcatheter was removed. The aspiration catheter was advanced to the level of occlusion and connected to a penumbra aspiration pump. The guiding catheter balloon was inflated. The thrombectomy device and aspiration catheter were removed under constant aspiration. Follow-up left ICA angiogram showed recanalization of most of the M1 segment with occlusion at the level of the bifurcation. Under biplane roadmap, a zoom 71 aspiration catheter was navigated over a phenom 21 microcatheter and a synchro support microguidewire into the cavernous segment of the right ICA. The microcatheter was then navigated over the wire into the left  M2/MCA superior division branch. Then, a 5 x 37 mm embotrap stent retriever was deployed spanning the M1 and M2 segments. The device was allowed to intercalated with the clot for 4 minutes. The microcatheter was removed. The aspiration catheter was advanced to the level of occlusion and connected to a penumbra aspiration pump. The guiding catheter balloon was inflated. The thrombectomy device and aspiration catheter were removed under constant aspiration. Follow-up left  ICA angiogram showed recanalization of the M1 segment and superior division with persistent occlusion of the inferior division. Under biplane roadmap, a zoom 71 aspiration catheter was navigated over a phenom 21 microcatheter and a synchro support microguidewire into the cavernous segment of the right ICA. The microcatheter was then navigated over the wire into the left M2/MCA inferior division branch. Then, a 5 x 37 mm embotrap stent retriever was deployed spanning the M1 and M2 segments. The device was allowed to intercalated with the clot for 4 minutes. The microcatheter was removed. The aspiration catheter was advanced to the level of occlusion and connected to a penumbra aspiration pump. The guiding catheter balloon was inflated. The thrombectomy device and aspiration catheter were removed under constant aspiration. Follow-up left ICA angiogram showed complete recanalization of the left MCA vascular tree (TICI3). Flat panel CT of the head was obtained and post processed in a separate workstation with concurrent attending physician supervision. Selected images were sent to PACS. Contrast staining in the left basal ganglia was noted. Small subarachnoid hemorrhage versus contrast extravasation in the left sylvian fissure. Delayed follow-up angiogram showed persistent patency of the left MCA vascular tree with no evidence of embolus to new or distal territory. The catheter was subsequently withdrawn. Right femoral artery angiograms were obtained  with right anterior oblique and lateral views. The puncture is at the level of the common femoral artery. The femoral sheath was exchanged over the wire for a Perclose ProGlide which was utilized for access closure. Immediate hemostasis was achieved. IMPRESSION: 1. Successful mechanical thrombectomy for treatment of a left M1/MCA proximal occlusion. Three passes performed with combined technique (aspiration left stent retriever) with complete recanalization. 2. No embolus to new or distal territory. 3. Small left sylvian subarachnoid hemorrhage versus process extravasation. PLAN: - Transfer to ICU- Maintain intubated as no covid-19 test result available- SBP 120-140 mmHg- Bed rest post femoral access x6 hours Electronically Signed   By: Pedro Earls M.D.   On: 09/04/2019 13:09   CT CEREBRAL PERFUSION W CONTRAST  Result Date: 09/02/2019 CLINICAL DATA:  Code stroke EXAM: CT HEAD WITHOUT CONTRAST CT ANGIOGRAPHY HEAD AND NECK CT PERFUSION BRAIN TECHNIQUE: Multidetector CT imaging of the head and neck was performed using the standard protocol during bolus administration of intravenous contrast. Multiplanar CT image reconstructions and MIPs were obtained to evaluate the vascular anatomy. Carotid stenosis measurements (when applicable) are obtained utilizing NASCET criteria, using the distal internal carotid diameter as the denominator. Multiphase CT imaging of the brain was performed following IV bolus contrast injection. Subsequent parametric perfusion maps were calculated using RAPID software. CONTRAST:  163m OMNIPAQUE IOHEXOL 350 MG/ML SOLN COMPARISON:  2020 head CT FINDINGS: Brain: There is no acute intracranial hemorrhage. Suspected new hypoattenuation of the body of caudate nucleus and posterior lentiform nucleus on the left. Prominence of the ventricles and sulci reflects generalized parenchymal volume. Confluent areas of hypoattenuation in the supratentorial white matter nonspecific but  probably reflect advanced chronic microvascular ischemic changes. There is no extra-axial fluid collection. Vascular: Hyperdensity of the left MCA. Skull: Small right burr holes. Sinuses/Orbits: No acute finding. Other: Mastoid air cells are clear. ASPECTS (AKendrickStroke Program Early CT Score) - Ganglionic level infarction (caudate, lentiform nuclei, internal capsule, insula, M1-M3 cortex): 5 - Supraganglionic infarction (M4-M6 cortex): 3 Total score (0-10 with 10 being normal): 8 CTA NECK FINDINGS Aortic arch: Calcified and noncalcified plaque along the arch and patent great vessel origins. Right carotid system: Patent. Mild calcified plaque at  the ICA origin causing minimal stenosis. Left carotid system: Patent. Trace calcified plaque at the ICA origin without measurable stenosis. Vertebral arteries: Patent. Left vertebral artery is slightly dominant. Minor calcified plaque is present. Skeleton: Multilevel degenerative changes of the cervical spine. Other neck: No mass or adenopathy. Upper chest: No apical lung mass. Review of the MIP images confirms the above findings CTA HEAD FINDINGS Anterior circulation: Intracranial internal carotid arteries are patent with trace calcified plaque. There is nearly occlusive thrombus within the proximal left M1 MCA with faint contrast enhancement more distally extending into a M2 branch. Diminished flow within distal left MCA branches. Right middle cerebral and both anterior cerebral arteries are patent. Posterior circulation: Intracranial vertebral, basilar, and posterior cerebral arteries are patent. There are bilateral posterior communicating arteries present. Venous sinuses: As permitted by contrast timing, patent. Review of the MIP images confirms the above findings CT Brain Perfusion Findings: CBF (<30%) Volume: 52m Perfusion (Tmax>6.0s) volume: 872mMismatch Volume: 8338mnfarction Location: Left basal ganglia corresponding to noncontrast CT findings. Area of penumbra  is within the left MCA territory. IMPRESSION: No acute intracranial hemorrhage. 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 ASPECT score of 8. There is nearly occlusive thrombus within the proximal left M1 MCA extending into an M2 branch. Perfusion imaging demonstrates core infarction of 4 mL (corresponds to noncontrast CT finding) and penumbra of 83 mL in the left MCA territory. Initial results were called by telephone at the time of interpretation on 09/02/2019 at 11:43 am to provider Dr. BhaCurly Shoresho verbally acknowledged these results. CTA/CTP results were communicated at 11:55 amon 8/14/2021by text page via the AMI[facility] Healthcare Districtssaging system. Electronically Signed   By: PraMacy MisD.   On: 09/02/2019 12:05   DG CHEST PORT 1 VIEW  Result Date: 09/06/2019 CLINICAL DATA:  Respiratory failure.  Lethargy. EXAM: PORTABLE CHEST 1 VIEW COMPARISON:  01/14/2008 FINDINGS: The heart is within normal limits in size given the AP projection and portable technique. Moderate tortuosity and calcification of the thoracic aorta. Pulmonary hila are slightly prominent but probably normal for age and degree of inspiration. Low lung volumes with vascular crowding and streaky atelectasis. No infiltrates, edema, effusions or pneumothorax. The bony thorax is intact. IMPRESSION: 1. Low lung volumes with vascular crowding and streaky atelectasis. 2. No infiltrates, edema or effusions. Electronically Signed   By: P. Marijo SanesD.   On: 09/06/2019 13:25   DG Abd Portable 1V  Result Date: 09/03/2019 CLINICAL DATA:  Evaluate enteric tube. EXAM: PORTABLE ABDOMEN - 1 VIEW COMPARISON:  None. FINDINGS: Enteric tube tip and side-port project over the stomach. Unremarkable bowel gas pattern. Lumbar spinal hardware. IMPRESSION: Enteric tube tip and side-port project over the stomach. Electronically Signed   By: DreLovey NewcomerD.   On: 09/03/2019 13:26   DG Swallowing Func-Speech Pathology  Result Date:  09/11/2019 Objective Swallowing Evaluation: Type of Study: MBS-Modified Barium Swallow Study  Patient Details Name: Cynthia G GJALISE ZAWISTOWSKIN: [id number] of Birth: 12/[name] 29, 1934day's Date: 09/11/2019 Past Medical History: Past Medical History: Diagnosis Date  Arthritis   Atrial fibrillation ([facility] Cataract   BILATERAL  DDD (degenerative disc disease), cervical   Depression   Diverticulosis   Falls   GERD (gastroesophageal reflux disease)   Glaucoma   Headache   Hyperlipidemia   Hyperlipidemia   Hypertension   Osteoporosis   Peripheral neuropathy   Spondylosis   lumbar  Subdural hematoma (HCC)  Past Surgical History: Past Surgical History: Procedure Laterality Date  APPENDECTOMY    BACK SURGERY  2010  [place] FOR SUBDURAL HEMATOMA    x 2 hematoma  COLONOSCOPY    IR CT HEAD LTD  09/04/2019  IR PERCUTANEOUS ART THROMBECTOMY/INFUSION INTRACRANIAL INC DIAG ANGIO  09/02/2019     IR PERCUTANEOUS ART THROMBECTOMY/INFUSION INTRACRANIAL INC DIAG ANGIO  09/04/2019  KNEE SURGERY Bilateral   RADIOLOGY WITH ANESTHESIA N/A 09/02/2019  Procedure: IR WITH ANESTHESIA;  Surgeon: Radiologist, Medication, MD;  Location: [facility];  Service: Radiology;  Laterality: N/A;  VAGINAL HYSTERECTOMY   HPI: 86 year old female with past medical history of A. fib not on anticoagulation due to frequent falling and prior subdural hematoma, hypertension, memory loss, GERD who was admitted to the hospital for altered mental status, found to have a left MCA stroke and is now status post thrombectomy. Extubated 8/16.  Assessment / Plan / Recommendation CHL IP CLINICAL IMPRESSIONS 09/11/2019 Clinical Impression Pt presents with mild-mod oropharyngeal dysphagia s/p acute CVA and deconditioning. Pt's oral phase was most remarkable for overall weak lingual manipulation of boluses, decreased bolus cohesion, and prolonged mastication of solids. Thin barium trials were administered via cup and straw. Left and right anterior loss of thin boluses  noted with consumption from cup. Pt's anterior laryngeal mobility is limited and incomplete laryngeal vestibule closure noted; pt's epiglottis does not achieve full inversion across consistencies, and suspect this is exacerbated by present of Cortrak NG tube. As a result, thin barium was both penetrated on some occasions and aspirated on others, with a delayed cough response, which was ineffective to clear aspirates from the trachea (PAS score 7). Nectar barium was deglutated with only one instance of penetration into the laryngeal vestibule, which remained above the vocal folds and appeared to be ejected with a subsequent reflexive swallow triggered by patient (PAS score 2). Pureed solids were consumed with timely and effective oral transit and clearance, and no airway intrusion noted. Pt also demonstrated ability to follow verbal commands to perform an extra dry swallow throughout intake, to assist with partial clearance of mild vallecular residue of solids and liquids. Would recommend pt initiate a puree texture solid diet, nectar thick liquids (straws okay and should be used to minimize anterior loss), medications crushed in puree, cues for following solids with liquids and intermittent extra dry swallow. Please provide full supervision during meals to ensure safe positioning and use of swallow precautions/strategies. SLP Visit Diagnosis Dysphagia, oropharyngeal phase (R13.12) Attention and concentration deficit following -- Frontal lobe and executive function deficit following -- Impact on safety and function Mild aspiration risk   CHL IP TREATMENT RECOMMENDATION 09/04/2019 Treatment Recommendations Therapy as outlined in treatment plan below   Prognosis 09/04/2019 Prognosis for Safe Diet Advancement Good Barriers to Reach Goals Cognitive deficits;Language deficits Barriers/Prognosis Comment -- CHL IP DIET RECOMMENDATION 09/11/2019 SLP Diet Recommendations Dysphagia 1 (Puree) solids;Nectar thick liquid Liquid  Administration via -- Medication Administration Crushed with puree Compensations Minimize environmental distractions;Slow rate;Small sips/bites;Monitor for anterior loss;Multiple dry swallows after each bite/sip;Follow solids with liquid Postural Changes Remain semi-upright after after feeds/meals (Comment);Seated upright at 90 degrees   CHL IP OTHER RECOMMENDATIONS 09/11/2019 Recommended Consults -- Oral Care Recommendations Oral care BID Other Recommendations Have oral suction available;Order thickener from pharmacy;Prohibited food (jello, ice cream, thin soups);Remove water pitcher   CHL IP FOLLOW UP RECOMMENDATIONS 09/07/2019 Follow up Recommendations Inpatient Rehab   CHL IP FREQUENCY AND DURATION 09/04/2019 Speech Therapy Frequency (ACUTE ONLY) min 2x/week Treatment Duration --      CHL IP ORAL PHASE 09/11/2019  Oral Phase Impaired Oral - Pudding Teaspoon -- Oral - Pudding Cup -- Oral - Honey Teaspoon -- Oral - Honey Cup -- Oral - Nectar Teaspoon -- Oral - Nectar Cup -- Oral - Nectar Straw Piecemeal swallowing;Decreased bolus cohesion;Weak lingual manipulation;Delayed oral transit Oral - Thin Teaspoon -- Oral - Thin Cup Delayed oral transit;Piecemeal swallowing;Decreased bolus cohesion;Right anterior bolus loss;Left anterior bolus loss;Weak lingual manipulation Oral - Thin Straw Piecemeal swallowing;Decreased bolus cohesion;Weak lingual manipulation;Delayed oral transit Oral - Puree Weak lingual manipulation;Piecemeal swallowing Oral - Mech Soft Delayed oral transit;Impaired mastication;Weak lingual manipulation;Reduced posterior propulsion;Decreased bolus cohesion;Lingual/palatal residue;Piecemeal swallowing Oral - Regular -- Oral - Multi-Consistency -- Oral - Pill -- Oral Phase - Comment --  CHL IP PHARYNGEAL PHASE 09/11/2019 Pharyngeal Phase Impaired Pharyngeal- Pudding Teaspoon -- Pharyngeal -- Pharyngeal- Pudding Cup -- Pharyngeal -- Pharyngeal- Honey Teaspoon -- Pharyngeal -- Pharyngeal- Honey Cup --  Pharyngeal -- Pharyngeal- Nectar Teaspoon -- Pharyngeal -- Pharyngeal- Nectar Cup -- Pharyngeal -- Pharyngeal- Nectar Straw Reduced epiglottic inversion;Reduced anterior laryngeal mobility;Reduced airway/laryngeal closure;Penetration/Aspiration during swallow;Pharyngeal residue - valleculae;Compensatory strategies attempted (with notebox) Pharyngeal Material enters airway, remains ABOVE vocal cords then ejected out Pharyngeal- Thin Teaspoon -- Pharyngeal -- Pharyngeal- Thin Cup Reduced epiglottic inversion;Reduced anterior laryngeal mobility;Reduced airway/laryngeal closure;Pharyngeal residue - valleculae;Penetration/Aspiration during swallow;Penetration/Apiration after swallow Pharyngeal Material enters airway, CONTACTS cords and not ejected out;Material enters airway, passes BELOW cords and not ejected out despite cough attempt by patient Pharyngeal- Thin Straw Reduced epiglottic inversion;Reduced airway/laryngeal closure;Reduced anterior laryngeal mobility;Penetration/Aspiration during swallow;Penetration/Apiration after swallow;Pharyngeal residue - valleculae Pharyngeal Material enters airway, passes BELOW cords and not ejected out despite cough attempt by patient;Material enters airway, CONTACTS cords and not ejected out Pharyngeal- Puree Reduced epiglottic inversion;Reduced airway/laryngeal closure;Pharyngeal residue - valleculae;Reduced anterior laryngeal mobility Pharyngeal -- Pharyngeal- Mechanical Soft Reduced anterior laryngeal mobility;Reduced epiglottic inversion;Pharyngeal residue - valleculae;Reduced airway/laryngeal closure Pharyngeal -- Pharyngeal- Regular -- Pharyngeal -- Pharyngeal- Multi-consistency -- Pharyngeal -- Pharyngeal- Pill -- Pharyngeal -- Pharyngeal Comment --  CHL IP CERVICAL ESOPHAGEAL PHASE 09/11/2019 Cervical Esophageal Phase WFL Pudding Teaspoon -- Pudding Cup -- Honey Teaspoon -- Honey Cup -- Nectar Teaspoon -- Nectar Cup -- Nectar Straw -- Thin Teaspoon -- Thin Cup -- Thin Straw  -- Puree -- Mechanical Soft -- Regular -- Multi-consistency -- Pill -- Cervical Esophageal Comment -- [name] 09/11/2019, 9:53 AM              EEG adult  Result Date: 09/06/2019 Lora Havens, MD     09/06/2019  3:46 PM Patient Name: [name] MRN: [id number] Epilepsy Attending: Lora Havens Referring Physician/Provider: Burnetta Sabin, NP Date: 09/06/2019 Duration: 21.30 mins Patient history: 86 year old female who presented with speech disturbance, left-sided gaze deviation and nonrhythmic shaking of left side.  CT showed left MCA acute infarct.  EEG to assess for seizures. Level of alertness: Awake AEDs during EEG study: None Technical aspects: This EEG study was done with scalp electrodes positioned according to the 10-20 International system of electrode placement. Electrical activity was acquired at a sampling rate of _0  and reviewed with a high frequency filter of _1  and a low frequency filter of _2 . EEG data were recorded continuously and digitally stored. Description: The posterior dominant rhythm consists of 8 Hz activity of moderate voltage (25-35 uV) seen predominantly in posterior head regions, symmetric and reactive to eye opening and eye closing. EEG showed continuous generalized and lateralized left hemisphere 3 to 6 Hz theta-delta slowing with overriding 13-_3  generalized beta activity. Hyperventilation and photic stimulation  were not performed.   ABNORMALITY -Continuous slow, generalized and lateralized left hemisphere IMPRESSION: This study is suggestive of cortical dysfunction in left hemisphere likely secondary to underlying infarct as well as mild to moderate diffuse encephalopathy, nonspecific etiology. No seizures or definite epileptiform discharges were seen throughout the recording. Lora Havens   ECHOCARDIOGRAM COMPLETE  Result Date: 09/03/2019    ECHOCARDIOGRAM REPORT   Patient Name:   [name] Date of Exam: 09/03/2019 Medical Rec #:  [id number]       Height:       65.0 in Accession #:    [id number]     Weight:       185.8 lb Date of Birth:  [birth year]     BSA:          1.917 m Patient Age:    49 years       BP:           142/84 mmHg Patient Gender: F              HR:           79 bpm. Exam Location:  Inpatient Procedure: 2D Echo Indications:    434.91 stroke  History:        Patient has prior history of Echocardiogram examinations.                 Arrythmias:Atrial Fibrillation; Risk Factors:Hypertension,                 Dyslipidemia and Former Smoker.  Sonographer:    Jannett Celestine RDCS (AE) Referring Phys: [id number] [place]  1. Left ventricular ejection fraction, by estimation, is 60 to 65%. The left ventricle has normal function. The left ventricle has no regional wall motion abnormalities. There is mild concentric left ventricular hypertrophy. Left ventricular diastolic function could not be evaluated.  2. Right ventricular systolic function is normal. The right ventricular size is moderately enlarged.  3. The mitral valve is degenerative. Trivial mitral valve regurgitation. No evidence of mitral stenosis.  4. The aortic valve is tricuspid. Aortic valve regurgitation is not visualized. Mild aortic valve stenosis. Conclusion(s)/Recommendation(s): Patient with stroke and atrial fibrillation. No intracardiac source of embolism identified other than atrial fibrilliation rhythm. FINDINGS  Left Ventricle: Left ventricular ejection fraction, by estimation, is 60 to 65%. The left ventricle has normal function. The left ventricle has no regional wall motion abnormalities. The left ventricular internal cavity size was normal in size. There is  mild concentric left ventricular hypertrophy. Left ventricular diastolic function could not be evaluated due to atrial fibrillation. Left ventricular diastolic function could not be evaluated. Right Ventricle: The right ventricular size is moderately enlarged. No increase in right ventricular wall thickness.  Right ventricular systolic function is normal. Left Atrium: Left atrial size was normal in size. Right Atrium: Right atrial size was normal in size. Pericardium: Trivial pericardial effusion is present. Presence of pericardial fat pad. Mitral Valve: The mitral valve is degenerative in appearance. Mild mitral annular calcification. Trivial mitral valve regurgitation. No evidence of mitral valve stenosis. Tricuspid Valve: The tricuspid valve is grossly normal. Tricuspid valve regurgitation is mild . No evidence of tricuspid stenosis. Aortic Valve: The aortic valve is tricuspid. . There is moderate thickening and moderate calcification of the aortic valve. Aortic valve regurgitation is not visualized. Mild aortic stenosis is present. There is moderate thickening of the aortic valve. There is moderate calcification of the aortic valve. Aortic valve mean gradient measures 9.0  mmHg. Aortic valve peak gradient measures 14.0 mmHg. Aortic valve area, by VTI measures 1.93 cm. Pulmonic Valve: The pulmonic valve was grossly normal. Pulmonic valve regurgitation is trivial. No evidence of pulmonic stenosis. Aorta: The aortic root and ascending aorta are structurally normal, with no evidence of dilitation. Venous: IVC assessment for right atrial pressure unable to be performed due to mechanical ventilation. IAS/Shunts: The atrial septum is grossly normal.  LEFT VENTRICLE PLAX 2D LVIDd:         3.70 cm LVIDs:         2.40 cm LV PW:         1.00 cm LV IVS:        1.30 cm LVOT diam:     2.20 cm LV SV:         66 LV SV Index:   34 LVOT Area:     3.80 cm  RIGHT VENTRICLE RV S prime:     13.40 cm/s TAPSE (M-mode): 1.9 cm LEFT ATRIUM             Index       RIGHT ATRIUM           Index LA diam:        3.60 cm 1.88 cm/m  RA Area:     16.20 cm LA Vol (A2C):   58.0 ml 30.25 ml/m RA Volume:   39.70 ml  20.71 ml/m LA Vol (A4C):   35.2 ml 18.36 ml/m LA Biplane Vol: 45.3 ml 23.63 ml/m  AORTIC VALVE AV Area (Vmax):    2.07 cm AV Area  (Vmean):   1.70 cm AV Area (VTI):     1.93 cm AV Vmax:           187.00 cm/s AV Vmean:          142.000 cm/s AV VTI:            0.343 m AV Peak Grad:      14.0 mmHg AV Mean Grad:      9.0 mmHg LVOT Vmax:         102.00 cm/s LVOT Vmean:        63.600 cm/s LVOT VTI:          0.174 m LVOT/AV VTI ratio: 0.51  AORTA Ao Root diam: 3.20 cm MITRAL VALVE                TRICUSPID VALVE MV Area (PHT): 4.39 cm     TR Peak grad:   35.0 mmHg MV Decel Time: 173 msec     TR Vmax:        296.00 cm/s MV E velocity: 118.00 cm/s                             SHUNTS                             Systemic VTI:  0.17 m                             Systemic Diam: 2.20 cm Eleonore Chiquito MD Electronically signed by Eleonore Chiquito MD Signature Date/Time: 09/03/2019/11:14:08 AM    Final    IR PERCUTANEOUS ART THROMBECTOMY/INFUSION INTRACRANIAL INC DIAG ANGIO  Result Date: 09/04/2019 INDICATION: 86 year old female with past medical history significant for atrial fibrillation (not on anticoagulation due to prior  subdural hematoma), hypertension, hyperlipidemia, heart disease, mild cognitive impairment of vascular etiology complicated by alcohol use with a premorbid Rankin scale of 0. She presented to ED with aphasia, left gaze deviation and nonrhythmic shaking of the left side, NIHSS 22. She was last known well at 2:30 p.m. on 09/01/2019. No IV tPA administered as patient was outside of the window. Head CT showed prominent white matter disease and atrophy with hyperdense left MCA without large acute infarct or hemorrhage. CT angiogram of the head and neck showed a proximal left M1/MCA occlusion extending into the M2 segment. CT perfusion showed a core infarct of 4 mL with a penumbra of 83 mL. She was then transferred to our service for a diagnostic cerebral angiogram and emergency mechanical thrombectomy. EXAM: DIAGNOSTIC CEREBRAL ANGIOGRAM MECHANICAL THROMBECTOMY FLAT PANEL HEAD CT COMPARISON:  CT/CT angiogram of the head and neck September 02, 2019 MEDICATIONS: No antibiotics given. ANESTHESIA/SEDATION: The procedure was performed in the general anesthesia. FLUOROSCOPY TIME:  Fluoroscopy Time: 31 minutes 12 seconds (678 mGy). COMPLICATIONS: None immediate. TECHNIQUE: Informed written consent was obtained from the patient's daughter after a thorough discussion of the procedural risks, benefits and alternatives. All questions were addressed. Maximal Sterile Barrier Technique was utilized including caps, mask, sterile gowns, sterile gloves, sterile drape, hand hygiene and skin antiseptic. A timeout was performed prior to the initiation of the procedure. The right groin was prepped and draped in the usual sterile fashion. Using a micropuncture kit and the modified Seldinger technique, access was gained to the right common femoral artery and an 8 French sheath was placed. Under fluoroscopy, an 8 [place] Walrus balloon guide catheter was navigated over a 6 [place] VTK catheter and a 0.035" Terumo Glidewire into the aortic arch. The catheter was placed into the left common carotid artery and then advanced into the left internal carotid artery. The inner catheter was removed. Frontal and lateral angiograms of the head were obtained. FINDINGS: Occlusion of the left middle cerebral artery at its origin. Fair collateral flow is seen from the left ACA to the left MCA vascular tree. PROCEDURE: Under biplane roadmap, a zoom 71 aspiration catheter was navigated over a phenom 21 microcatheter and a synchro support microguidewire into the cavernous segment of the right ICA. The microcatheter was then navigated over the wire into the left M2/MCA inferior division branch. Then, a 5 x 37 mm embotrap stent retriever was deployed spanning the M1 and M2 segments. The device was allowed to intercalated with the clot for 4 minutes. The microcatheter was removed. The aspiration catheter was advanced to the level of occlusion and connected to a penumbra aspiration pump. The guiding  catheter balloon was inflated. The thrombectomy device and aspiration catheter were removed under constant aspiration. Follow-up left ICA angiogram showed recanalization of most of the M1 segment with occlusion at the level of the bifurcation. Under biplane roadmap, a zoom 71 aspiration catheter was navigated over a phenom 21 microcatheter and a synchro support microguidewire into the cavernous segment of the right ICA. The microcatheter was then navigated over the wire into the left M2/MCA superior division branch. Then, a 5 x 37 mm embotrap stent retriever was deployed spanning the M1 and M2 segments. The device was allowed to intercalated with the clot for 4 minutes. The microcatheter was removed. The aspiration catheter was advanced to the level of occlusion and connected to a penumbra aspiration pump. The guiding catheter balloon was inflated. The thrombectomy device and aspiration catheter were removed under constant aspiration.  Follow-up left ICA angiogram showed recanalization of the M1 segment and superior division with persistent occlusion of the inferior division. Under biplane roadmap, a zoom 71 aspiration catheter was navigated over a phenom 21 microcatheter and a synchro support microguidewire into the cavernous segment of the right ICA. The microcatheter was then navigated over the wire into the left M2/MCA inferior division branch. Then, a 5 x 37 mm embotrap stent retriever was deployed spanning the M1 and M2 segments. The device was allowed to intercalated with the clot for 4 minutes. The microcatheter was removed. The aspiration catheter was advanced to the level of occlusion and connected to a penumbra aspiration pump. The guiding catheter balloon was inflated. The thrombectomy device and aspiration catheter were removed under constant aspiration. Follow-up left ICA angiogram showed complete recanalization of the left MCA vascular tree (TICI3). Flat panel CT of the head was obtained and post  processed in a separate workstation with concurrent attending physician supervision. Selected images were sent to PACS. Contrast staining in the left basal ganglia was noted. Small subarachnoid hemorrhage versus contrast extravasation in the left sylvian fissure. Delayed follow-up angiogram showed persistent patency of the left MCA vascular tree with no evidence of embolus to new or distal territory. The catheter was subsequently withdrawn. Right femoral artery angiograms were obtained with right anterior oblique and lateral views. The puncture is at the level of the common femoral artery. The femoral sheath was exchanged over the wire for a Perclose ProGlide which was utilized for access closure. Immediate hemostasis was achieved. IMPRESSION: 1. Successful mechanical thrombectomy for treatment of a left M1/MCA proximal occlusion. Three passes performed with combined technique (aspiration left stent retriever) with complete recanalization. 2. No embolus to new or distal territory. 3. Small left sylvian subarachnoid hemorrhage versus process extravasation. PLAN: - Transfer to ICU- Maintain intubated as no covid-19 test result available- SBP 120-140 mmHg- Bed rest post femoral access x6 hours Electronically Signed   By: Pedro Earls M.D.   On: 09/04/2019 13:09   CT HEAD CODE STROKE WO CONTRAST  Result Date: 09/02/2019 CLINICAL DATA:  Code stroke EXAM: CT HEAD WITHOUT CONTRAST CT ANGIOGRAPHY HEAD AND NECK CT PERFUSION BRAIN TECHNIQUE: Multidetector CT imaging of the head and neck was performed using the standard protocol during bolus administration of intravenous contrast. Multiplanar CT image reconstructions and MIPs were obtained to evaluate the vascular anatomy. Carotid stenosis measurements (when applicable) are obtained utilizing NASCET criteria, using the distal internal carotid diameter as the denominator. Multiphase CT imaging of the brain was performed following IV bolus contrast  injection. Subsequent parametric perfusion maps were calculated using RAPID software. CONTRAST:  166m OMNIPAQUE IOHEXOL 350 MG/ML SOLN COMPARISON:  2020 head CT FINDINGS: Brain: There is no acute intracranial hemorrhage. Suspected new hypoattenuation of the body of caudate nucleus and posterior lentiform nucleus on the left. Prominence of the ventricles and sulci reflects generalized parenchymal volume. Confluent areas of hypoattenuation in the supratentorial white matter nonspecific but probably reflect advanced chronic microvascular ischemic changes. There is no extra-axial fluid collection. Vascular: Hyperdensity of the left MCA. Skull: Small right burr holes. Sinuses/Orbits: No acute finding. Other: Mastoid air cells are clear. ASPECTS ([place] Score) - Ganglionic level infarction (caudate, lentiform nuclei, internal capsule, insula, M1-M3 cortex): 5 - Supraganglionic infarction (M4-M6 cortex): 3 Total score (0-10 with 10 being normal): 8 CTA NECK FINDINGS Aortic arch: Calcified and noncalcified plaque along the arch and patent great vessel origins. Right carotid system: Patent. Mild  calcified plaque at the ICA origin causing minimal stenosis. Left carotid system: Patent. Trace calcified plaque at the ICA origin without measurable stenosis. Vertebral arteries: Patent. Left vertebral artery is slightly dominant. Minor calcified plaque is present. Skeleton: Multilevel degenerative changes of the cervical spine. Other neck: No mass or adenopathy. Upper chest: No apical lung mass. Review of the MIP images confirms the above findings CTA HEAD FINDINGS Anterior circulation: Intracranial internal carotid arteries are patent with trace calcified plaque. There is nearly occlusive thrombus within the proximal left M1 MCA with faint contrast enhancement more distally extending into a M2 branch. Diminished flow within distal left MCA branches. Right middle cerebral and both anterior cerebral  arteries are patent. Posterior circulation: Intracranial vertebral, basilar, and posterior cerebral arteries are patent. There are bilateral posterior communicating arteries present. Venous sinuses: As permitted by contrast timing, patent. Review of the MIP images confirms the above findings CT Brain Perfusion Findings: CBF (<30%) Volume: 42m Perfusion (Tmax>6.0s) volume: 867mMismatch Volume: 8319mnfarction Location: Left basal ganglia corresponding to noncontrast CT findings. Area of penumbra is within the left MCA territory. IMPRESSION: No acute intracranial hemorrhage. 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 ASPECT score of 8. There is nearly occlusive thrombus within the proximal left M1 MCA extending into an M2 branch. Perfusion imaging demonstrates core infarction of 4 mL (corresponds to noncontrast CT finding) and penumbra of 83 mL in the left MCA territory. Initial results were called by telephone at the time of interpretation on 09/02/2019 at 11:43 am to provider Dr. BhaCurly Shoresho verbally acknowledged these results. CTA/CTP results were communicated at 11:55 amon 8/14/2021by text page via the [facility]ssaging system. Electronically Signed   By: PraMacy MisD.   On: 09/02/2019 12:05   CT ANGIO HEAD CODE STROKE  Result Date: 09/02/2019 CLINICAL DATA:  Code stroke EXAM: CT HEAD WITHOUT CONTRAST CT ANGIOGRAPHY HEAD AND NECK CT PERFUSION BRAIN TECHNIQUE: Multidetector CT imaging of the head and neck was performed using the standard protocol during bolus administration of intravenous contrast. Multiplanar CT image reconstructions and MIPs were obtained to evaluate the vascular anatomy. Carotid stenosis measurements (when applicable) are obtained utilizing NASCET criteria, using the distal internal carotid diameter as the denominator. Multiphase CT imaging of the brain was performed following IV bolus contrast injection. Subsequent parametric perfusion maps were calculated using  RAPID software. CONTRAST:  100m17mNIPAQUE IOHEXOL 350 MG/ML SOLN COMPARISON:  2020 head CT FINDINGS: Brain: There is no acute intracranial hemorrhage. Suspected new hypoattenuation of the body of caudate nucleus and posterior lentiform nucleus on the left. Prominence of the ventricles and sulci reflects generalized parenchymal volume. Confluent areas of hypoattenuation in the supratentorial white matter nonspecific but probably reflect advanced chronic microvascular ischemic changes. There is no extra-axial fluid collection. Vascular: Hyperdensity of the left MCA. Skull: Small right burr holes. Sinuses/Orbits: No acute finding. Other: Mastoid air cells are clear. ASPECTS ([place] Program Early CT Score) - Ganglionic level infarction (caudate, lentiform nuclei, internal capsule, insula, M1-M3 cortex): 5 - Supraganglionic infarction (M4-M6 cortex): 3 Total score (0-10 with 10 being normal): 8 CTA NECK FINDINGS Aortic arch: Calcified and noncalcified plaque along the arch and patent great vessel origins. Right carotid system: Patent. Mild calcified plaque at the ICA origin causing minimal stenosis. Left carotid system: Patent. Trace calcified plaque at the ICA origin without measurable stenosis. Vertebral arteries: Patent. Left vertebral artery is slightly dominant. Minor calcified plaque is present. Skeleton: Multilevel degenerative changes of the cervical spine. Other  neck: No mass or adenopathy. Upper chest: No apical lung mass. Review of the MIP images confirms the above findings CTA HEAD FINDINGS Anterior circulation: Intracranial internal carotid arteries are patent with trace calcified plaque. There is nearly occlusive thrombus within the proximal left M1 MCA with faint contrast enhancement more distally extending into a M2 branch. Diminished flow within distal left MCA branches. Right middle cerebral and both anterior cerebral arteries are patent. Posterior circulation: Intracranial vertebral, basilar,  and posterior cerebral arteries are patent. There are bilateral posterior communicating arteries present. Venous sinuses: As permitted by contrast timing, patent. Review of the MIP images confirms the above findings CT Brain Perfusion Findings: CBF (<30%) Volume: 1m Perfusion (Tmax>6.0s) volume: 830mMismatch Volume: 8358mnfarction Location: Left basal ganglia corresponding to noncontrast CT findings. Area of penumbra is within the left MCA territory. IMPRESSION: No acute intracranial hemorrhage. 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 ASPECT score of 8. There is nearly occlusive thrombus within the proximal left M1 MCA extending into an M2 branch. Perfusion imaging demonstrates core infarction of 4 mL (corresponds to noncontrast CT finding) and penumbra of 83 mL in the left MCA territory. Initial results were called by telephone at the time of interpretation on 09/02/2019 at 11:43 am to provider Dr. BhaCurly Shoresho verbally acknowledged these results. CTA/CTP results were communicated at 11:55 amon 8/14/2021by text page via the [facility] system. Electronically Signed   By: PraMacy MisD.   On: 09/02/2019 12:05   CT ANGIO NECK CODE STROKE  Result Date: 09/02/2019 CLINICAL DATA:  Code stroke EXAM: CT HEAD WITHOUT CONTRAST CT ANGIOGRAPHY HEAD AND NECK CT PERFUSION BRAIN TECHNIQUE: Multidetector CT imaging of the head and neck was performed using the standard protocol during bolus administration of intravenous contrast. Multiplanar CT image reconstructions and MIPs were obtained to evaluate the vascular anatomy. Carotid stenosis measurements (when applicable) are obtained utilizing NASCET criteria, using the distal internal carotid diameter as the denominator. Multiphase CT imaging of the brain was performed following IV bolus contrast injection. Subsequent parametric perfusion maps were calculated using RAPID software. CONTRAST:  100m62mNIPAQUE IOHEXOL 350 MG/ML SOLN COMPARISON:   2020 head CT FINDINGS: Brain: There is no acute intracranial hemorrhage. Suspected new hypoattenuation of the body of caudate nucleus and posterior lentiform nucleus on the left. Prominence of the ventricles and sulci reflects generalized parenchymal volume. Confluent areas of hypoattenuation in the supratentorial white matter nonspecific but probably reflect advanced chronic microvascular ischemic changes. There is no extra-axial fluid collection. Vascular: Hyperdensity of the left MCA. Skull: Small right burr holes. Sinuses/Orbits: No acute finding. Other: Mastoid air cells are clear. ASPECTS ([place] Early CT Score) - Ganglionic level infarction (caudate, lentiform nuclei, internal capsule, insula, M1-M3 cortex): 5 - Supraganglionic infarction (M4-M6 cortex): 3 Total score (0-10 with 10 being normal): 8 CTA NECK FINDINGS Aortic arch: Calcified and noncalcified plaque along the arch and patent great vessel origins. Right carotid system: Patent. Mild calcified plaque at the ICA origin causing minimal stenosis. Left carotid system: Patent. Trace calcified plaque at the ICA origin without measurable stenosis. Vertebral arteries: Patent. Left vertebral artery is slightly dominant. Minor calcified plaque is present. Skeleton: Multilevel degenerative changes of the cervical spine. Other neck: No mass or adenopathy. Upper chest: No apical lung mass. Review of the MIP images confirms the above findings CTA HEAD FINDINGS Anterior circulation: Intracranial internal carotid arteries are patent with trace calcified plaque. There is nearly occlusive thrombus within the proximal left M1 MCA  with faint contrast enhancement more distally extending into a M2 branch. Diminished flow within distal left MCA branches. Right middle cerebral and both anterior cerebral arteries are patent. Posterior circulation: Intracranial vertebral, basilar, and posterior cerebral arteries are patent. There are bilateral posterior  communicating arteries present. Venous sinuses: As permitted by contrast timing, patent. Review of the MIP images confirms the above findings CT Brain Perfusion Findings: CBF (<30%) Volume: 39m Perfusion (Tmax>6.0s) volume: 810mMismatch Volume: 8370mnfarction Location: Left basal ganglia corresponding to noncontrast CT findings. Area of penumbra is within the left MCA territory. IMPRESSION: No acute intracranial hemorrhage. 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 ASPECT score of 8. There is nearly occlusive thrombus within the proximal left M1 MCA extending into an M2 branch. Perfusion imaging demonstrates core infarction of 4 mL (corresponds to noncontrast CT finding) and penumbra of 83 mL in the left MCA territory. Initial results were called by telephone at the time of interpretation on 09/02/2019 at 11:43 am to provider Dr. BhaCurly Shoresho verbally acknowledged these results. CTA/CTP results were communicated at 11:55 amon 8/14/2021by text page via the [facility] system. Electronically Signed   By: PraMacy MisD.   On: 09/02/2019 12:05    Labs:  Basic Metabolic Panel: No results for input(s): NA, K, CL, CO2, GLUCOSE, BUN, CREATININE, CALCIUM, MG, PHOS in the last 168 hours.  CBC: Recent Labs  Lab 09/19/19 1115  WBC 8.0  NEUTROABS 5.9  HGB 13.8  HCT 42.2  MCV 97.9  PLT 406*    CBG: Recent Labs  Lab 09/22/19 2135  GLUCAP 150*   Family history.  Father with CAD Sister with breast cancer.  Denies any colon cancer diabetes liver disease or kidney disease  Brief HPI:   [name] a [age]. right-handed female with history of atrial fibrillation not on anticoagulation secondary to prior subdural hematoma, hypertension, hyperlipidemia, CAD, mild cognitive impairments of vascular etiology complicated by alcohol use maintained on Aricept.  [facility] with right side weakness  and aphasia.  EMS noted blood pressure 180s/90s.  Admission chemistry sodium 134 glucose 136 hemoglobin A1c 5.8.  Cranial CT scan showed no acute intracranial hemorrhage.  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  Near occlusive thrombus within the proximal left M1 MCA extending into M2 branch.  Patient did not receive TPA.  Echocardiogram with ejection fraction of 60 to 65% no wall motion abnormalities.  Patient did undergo complete recanalization per interventional radiology intubated for a short time.  Follow-up head CT after revascularization showed petechial hemorrhage at the basal ganglia small subarachnoid hemorrhage along the sylvian fissure and again scan completed 09/06/2019 showing no evidence of new hemorrhage.  Hemorrhagic infarct left putamen stable.  Small amount hemorrhage sylvian fissure and left superior temporal lobe anteriorly stable.  Maintained on aspirin for CVA prophylaxis.  EEG suggestive of cortical dysfunction left hemisphere likely secondary to underlying infarct as well as mild to moderate encephalopathy no seizure activity.  Hospital course complicated by bouts of urinary retention requiring in and out catheterization.  Patient was admitted for a comprehensive rehab program.   Hospital Course: Cynthia G GKARALINA TIFTs admitted to rehab 09/07/2019 for inpatient therapies to consist of PT, ST and OT at least three hours five days a week. Past admission physiatrist, therapy team and rehab RN have worked together to provide customized collaborative inpatient rehab.  Pertaining to patient's left  MCA infarction remained stable interventional radiology for revascularization small SAH and monitored maintained on low-dose aspirin.  SCDs for DVT prophylaxis.  Mood stabilization with Zoloft as well as Aricept with Ritalin initiated titrated to 10 mg twice daily and also continued on amantadine.  Noted dysphagia tube feeds as well as dysphagia #1 nectar liquids as directed  dietary follow-up patient not able to maintain nutritional support with gastrostomy tube placed 09/22/2019 per interventional radiology.  Pain management use of Lidoderm patch.  Lipitor for hyperlipidemia.  There was some noted documentation of alcohol use she did receive Katragadda cessation of any alcohol products.  Atrial fibrillation no anticoagulation at this time other than aspirin due to history of subdural hematoma low-dose beta-blocker as advised.  Proteus UTI completing course of Keflex.  She did have some urinary retention weaning off the Urecholine.  Blood pressure controlled and monitored on Cozaar as well as Lopressor and Norvasc 10 mg daily.   Blood pressures were monitored on TID basis and controlled     Rehab course: During patient's stay in rehab weekly team conferences were held to monitor patient's progress, set goals and discuss barriers to discharge. At admission, patient required max assist sit to stand max assist sit to supine.  Moderate assist upper body bathing max assist lower body bathing max is upper body dressing total assist lower body dressing  Physical exam.  Blood pressure 154/75 pulse 70 temperature 98.3 respirations 18 oxygen saturation 96% room air Constitutional.  Alert no acute distress HEENT Head.  Normocephalic and atraumatic Eyes.  Pupils round and reactive to light no discharge without nystagmus Neck.  Supple nontender no JVD without thyromegaly Cardiac irregular irregular rate controlled Respiratory effort normal no respiratory distress without wheeze Abdomen.  Soft nontender positive bowel sounds without rebound Musculoskeletal normal range of motion Comments.  Trace grip on right otherwise 0 out of 5 right upper extremity and 0 out of 5 right lower extremity Left upper extremity least 4+/5 left upper extremity Neurological patient was a bit lethargic but arousable opens her eyes to commands nonverbal she did provide some yes no head nods to simple  questions she would squeeze hand on verbal command.   Marlana Salvage  has had improvement in activity tolerance, balance, postural control as well as ability to compensate for deficits. Marlana Salvage has had improvement in functional use RUE/LUE  and RLE/LLE as well as improvement in awareness.  Rolling left to right with max assist for placement of a bedpan.  Supine to sit with assist x2.  Patient with significant right lateral lean.  Assist x3 with steady to safely return to a sitting edge of bed.  Squat pivot transfer to TIS with total assist x2.  ADL squat pivot transfers with max assist of one.  Completes lateral anterior posterior reaching for cones on left with mod max assist.  Due to limited advances ongoing need for physical assistance it was felt skilled nursing facility was needed bed becoming available 09/26/2019       Disposition: Discharged to skilled nursing facility    Diet: Dysphagia #1 nectar thick liquid.  Osmolite  237 mL 3 times daily by tube.  Prosource liquid 45 mL 3 times a day by tube  Free water 200 mL every 6 hours by tube  Special Instructions: No smoking or alcohol  Medications at discharge 1.  Tylenol as needed by tube 2.  Amantadine 200 mg daily by tube 3.  Norvasc 10 mg daily by tube 4.  Aspirin 81 mg  daily by 2 5.  Lipitor 40 mg daily by tube 6.  Urecholine 25 mg 3 times daily by 2 x 1 week and stop 7.  Keflex 250 mg every 8 hours x4 days and stop 8.  Aricept 10 mg daily by 2 9.Xalatan ophthalmic solution 0.05% 1 drop both eyes bedtime 10.  Lidoderm patch change as directed 11.  Cozaar 25 mg daily by tube 12.  Ritalin 10 mg twice daily by tube at breakfast and lunch 13.  Lopressor 50 mg twice daily by 2 14.  Multivitamin daily by tube 15.  Protonix suspension 40 mg daily by tube 16.  Zoloft 50 mg daily by tube 17.  Timoptic 0.5% 1 drop both eyes daily 18.  Vitamin B12 3000 mcg daily by tube  30-35 min were spent completing discharge summary and discharge  planning  Discharge Instructions    Ambulatory referral to Neurology   Complete by: As directed    An appointment is requested in approximately 4 weeks left MCA infarction   Ambulatory referral to Physical Medicine Rehab   Complete by: As directed    Moderate complexity follow-up 1 month left MCA infarction.  Patient is for skilled nursing facility placement       Follow-up Information    Kirsteins, Luanna Salk, MD Follow up.   Specialty: Physical Medicine and Rehabilitation Why: Office to call for appointment Contact information: [place] [address] [phone]        Nahser, Wonda Cheng, MD Follow up.   Specialty: Cardiology Why: Call for appointment as needed Contact information: [address] [place] [address] [phone]        Sandi Mariscal, MD Follow up.   Specialties: Interventional Radiology, Radiology Why: Call for appointment as needed Contact information: [address] [place] [address] [phone]               Signed: Lavon Paganini [place] 09/26/2019, 5:10 AM

## 2019-09-24 NOTE — Plan of Care (Signed)
°  Problem: Sit to Stand Goal: LTG:  Patient will perform sit to stand with assistance level (PT) Description: LTG:  Patient will perform sit to stand with assistance level (PT) Outcome: Not Applicable Flowsheets (Taken 09/24/2019 0843) LTG: PT will perform sit to stand in preparation for functional mobility with assistance level: (d/c due to lack of progress) -- Note: Down graded due to slow progress and change in d/c location to SNF

## 2019-09-24 NOTE — Progress Notes (Signed)
Occupational Therapy Session Note  Patient Details  Name: [name] MRN: [id number] Date of Birth: 01-Feb-1932  Today's Date: 09/24/2019 OT Individual Time: 9528-4132 OT Individual Time Calculation (min): 55 min    Short Term Goals: Week 1:  OT Short Term Goal 1 (Week 1): Pt will sit EOB statically for ~5 min with supervison in prep for ADL task OT Short Term Goal 1 - Progress (Week 1): Progressing toward goal OT Short Term Goal 2 (Week 1): Pt will attend to familar ADL task demonstrating sustained attention for ~3 min with min cues OT Short Term Goal 2 - Progress (Week 1): Progressing toward goal OT Short Term Goal 3 (Week 1): Pt will don shirt with mod A OT Short Term Goal 3 - Progress (Week 1): Progressing toward goal OT Short Term Goal 4 (Week 1): Pt will in bed with mod A in prep for aDL tasks OT Short Term Goal 4 - Progress (Week 1): Progressing toward goal  Skilled Therapeutic Interventions/Progress Updates:    1;1. Pt received in bed agreeable to OT with increased time to arouse. Pt completes bed level bathing and dressing in supported long sitting and circle sitting with emphasis on anterior weight shift/reaching, core activaiton, hemi dressing techniques, attention/continuation/initiation skills, HOH A of RUE to incorporate in BADL tasks for NMR, L attention with max multimodal cuing. Pt completes LB dressing with MAX A, UB dressing with total A for bra and shirt and footwear with total A. Pt able to roll with MOD A of 1 to R and MAX of 2 to L for CM/peri/buttock hygeine. Pt completes squat pivot transfer with MAX A to TIS with +2 steadying equipment with facilitation of anterior weight shift/head hips relationship. Exited session with pt seated inbed, exit alarm on and call light tin reach  Therapy Documentation Precautions:  Precautions Precautions: Fall Precaution Comments: corerack; R inattention, peg tube placement on 9/2 Restrictions Weight Bearing Restrictions:  No General:   Vital Signs: Therapy Vitals Temp: (!) 97.5 F (36.4 C) Pulse Rate: 83 Resp: 16 BP: 126/90 Patient Position (if appropriate): Lying Oxygen Therapy SpO2: 99 % O2 Device: Room Air Pain:   ADL: ADL Eating: NPO Grooming: Dependent Upper Body Bathing: Dependent Lower Body Bathing: Dependent Upper Body Dressing: Dependent Lower Body Dressing: Dependent Toileting: Dependent Toilet Transfer: Not assessed Tub/Shower Transfer: Not assessed Vision   Perception    Praxis   Exercises:   Other Treatments:     Therapy/Group: Individual Therapy  Tonny Branch 09/24/2019, 7:49 AM

## 2019-09-24 NOTE — Progress Notes (Signed)
[facility] PHYSICAL MEDICINE & REHABILITATION PROGRESS NOTE   Subjective/Complaints:   Pt is more awake according to another daughter who's here today-  With addition of Amantadine- said she's been on it before, but likes the results of Amantadine AND ritalin- if that's possible to continue.   Having loose stools occ.   Was on tylenol for hand DJD, but patches have been somewhat helpful for sciatica Sx's.   It's hard to know where pain exactly coming from -yesterday daughter said RLE- today other daughter says LLE- and pt not clear due to aphasia.      ROS: limited due to aphasia   Objective:   No results found. No results for input(s): WBC, HGB, HCT, PLT in the last 72 hours. No results for input(s): NA, K, CL, CO2, GLUCOSE, BUN, CREATININE, CALCIUM in the last 72 hours.  Intake/Output Summary (Last 24 hours) at 09/24/2019 1934 Last data filed at 09/24/2019 1901 Gross per 24 hour  Intake --  Output 1595 ml  Net -1595 ml     Physical Exam: Vital Signs Blood pressure 134/72, pulse 85, temperature 97.8 F (36.6 C), resp. rate 18, height 5\' 5"  (1.651 m), weight 76.2 kg, SpO2 98 %.  General: No acute distress-sitting up in chair at bedside, wiggling- esp L side, daughter at bedside, NAD Mood and affect are flat, aphasic, but more awake, more attentive Heart: RRR Lungs: CTA B/L- no W/R/R- good air movement Abdomen: Soft, NT, ND, (+)BS  - slightly TTP just around new PEG_ looks good- no erythema Extremities: No clubbing, cyanosis, or edema Skin: No evidence of breakdown, no evidence of rash   Skin: Ecchymosis on forearm, right, no evidence of rash Neurologic: 4/5 in the left deltoid bicep tricep grip hip flexion knee extension ankle flexion Medical extremity without active movement right lower extremity has trace ankle plantarflexion  Musculoskeletal:No joint swelling, patient with poor bed positioning, leans toward the right side    Assessment/Plan: 1. Functional  deficits secondary to Left BG /MCA infarct  which require 3+ hours per day of interdisciplinary therapy in a comprehensive inpatient rehab setting.  Physiatrist is providing close team supervision and 24 hour management of active medical problems listed below.  Physiatrist and rehab team continue to assess barriers to discharge/monitor patient progress toward functional and medical goals  Care Tool:  Bathing    Body parts bathed by patient: Chest, Abdomen, Right upper leg, Left upper leg, Face   Body parts bathed by helper: Right arm, Left arm, Front perineal area, Buttocks, Left lower leg, Right lower leg     Bathing assist Assist Level: Maximal Assistance - Patient 24 - 49%     Upper Body Dressing/Undressing Upper body dressing   What is the patient wearing?: Pull over shirt    Upper body assist Assist Level: Maximal Assistance - Patient 25 - 49%    Lower Body Dressing/Undressing Lower body dressing      What is the patient wearing?: Pants, Incontinence brief     Lower body assist Assist for lower body dressing: 2 Helpers     Toileting Toileting    Toileting assist Assist for toileting: 2 Helpers     Transfers Chair/bed transfer  Transfers assist  Chair/bed transfer activity did not occur: Safety/medical concerns (would recommend lift for patient- needed skilled intervention to transfer)  Chair/bed transfer assist level: 2 Helpers     Locomotion Ambulation   Ambulation assist   Ambulation activity did not occur: Safety/medical concerns  Walk 10 feet activity   Assist  Walk 10 feet activity did not occur: Safety/medical concerns        Walk 50 feet activity   Assist Walk 50 feet with 2 turns activity did not occur: Safety/medical concerns         Walk 150 feet activity   Assist Walk 150 feet activity did not occur: Safety/medical concerns         Walk 10 feet on uneven surface  activity   Assist Walk 10 feet on  uneven surfaces activity did not occur: Safety/medical concerns         Wheelchair     Assist Will patient use wheelchair at discharge?: Yes      Wheelchair assist level: Dependent - Patient 0% Max wheelchair distance: 150    Wheelchair 50 feet with 2 turns activity    Assist        Assist Level: Dependent - Patient 0%   Wheelchair 150 feet activity     Assist      Assist Level: Dependent - Patient 0%   Blood pressure 134/72, pulse 85, temperature 97.8 F (36.6 C), resp. rate 18, height 5\' 5"  (1.651 m), weight 76.2 kg, SpO2 98 %.  Medical Problem List and Plan: 1.Right side weakness and aphasiasecondary to left MCA territory infarction status post IR revascularization with SA[facility] -patient may Shower if has good enough sitting balance -ELOS/Goals: Plan for discharge 9/7 to SNF, pending completion of gastrostomy     Continue CIR 2. Antithrombotics: -DVT/anticoagulation:SCDs- had petechial hemrrhages -antiplatelet therapy: Aspirin 81 mg daily 3. Pain Management:will schedule  Tylenol q6 hours through NGT. Well controlled.   9/4- will add lidoderm 2 patches 12 hr son; 12 hrs off  9/5- changed patches to 8am-8pm- can be left or right- and daughter can use REGULAR salonpas at night.  4. Mood:Zoloft 50 mg daily, Aricept, February 9 10 mg nightly-  -Methylphenidate increase to 10 mg  BID  9/4- can try to add Amantadine 100 mg daily- just in case- already on ritalin 10 mg BID  9/5- since already on Amantadine in past, increased to 200 mg daily-starting Monday- IS more attentive- not talking much- said- "OK" -antipsychotic agents: N/A 5. Neuropsych: This patientisNOTcapable of making decisions on herown behalf. 6. Skin/Wound Care:Routine skin checks 7. Fluids/Electrolytes/Nutrition:Routine in and outs with follow-up chemistries, Change TF times 8. Dysphagia. Tube feeds as directed. Dietary follow-up.  IR to assess for   PEG   -monitor intake as well as S/Sx of asp on po   -We will check chest x-ray, check CBC  9/4- can use PEG_ d/c cortrak 9. Hypertension. Lopressor 50 mg twice daily, Cozaar 25 mg daily, Norvasc 10 mg daily.  Vitals:   09/24/19 0433 09/24/19 1412  BP: 126/90 134/72  Pulse: 83 85  Resp: 16 18  Temp: (!) 97.5 F (36.4 C) 97.8 F (36.6 C)  SpO2: 99% 98%  suboptimal  control 9/2, starting flomax for bladder  Monitor BP effects   9/5- BP well controlled- con't regimen 10. Hyperlipidemia. Lipitor 11. History of alcohol use. Provide counseling with patient and family 43. Atrial fibrillation. No anticoagulation at this time.   Continue beta-blocker  HR controlled 8/28. 13. Urinary retention/proteus UTI- discussed with daughter this may cause a temporary setback in recovery   Recheck UA + for bact and WBCs, empiric keflex until the culture is back  Increase ICP freq due to high volumes   urecholine to help with emptying- increased to 25mg   8/24, Flomax started 9/2  -purewick if needed to keep skin dry, but mostly retaining right now 14. Sleep-/insomnia- pt took 30, not 3 mg of Melatonin nightly at home AND 3+ pills of ZZZquil at home to sleep nightly per daughter- Juluis Rainier  8/28: daughter is concerned regarding sleep apnea. Would like outpatient sleep study.   Sleep  Graph  15.  Constipation small daily BM will check KUB, schedule senokot, (not senna S due to mushy stools), may cause some GI discomfort, Already on Protonix given history of alcohol abuse with potential for gastritis  9/5- having loose stools per family.  LOS: 17 days A FACE TO FACE EVALUATION WAS PERFORMED  Daylen Hack 09/24/2019, 7:34 PM

## 2019-09-24 NOTE — Plan of Care (Signed)
°  Problem: Consults Goal: RH STROKE PATIENT EDUCATION Description: See Patient Education module for education specifics  Outcome: Progressing Goal: Nutrition Consult-if indicated Outcome: Progressing Goal: Diabetes Guidelines if Diabetic/Glucose > 140 Description: If diabetic or lab glucose is > 140 mg/dl - Initiate Diabetes/Hyperglycemia Guidelines & Document Interventions  Outcome: Progressing   Problem: RH BOWEL ELIMINATION Goal: RH STG MANAGE BOWEL WITH ASSISTANCE Description: STG Manage Bowel with max Assistance. Outcome: Progressing Goal: RH STG MANAGE BOWEL W/MEDICATION W/ASSISTANCE Description: STG Manage Bowel with Medication with max Assistance. Outcome: Progressing   Problem: RH BLADDER ELIMINATION Goal: RH STG MANAGE BLADDER WITH ASSISTANCE Description: STG Manage Bladder With total Assistance Outcome: Progressing Goal: RH STG MANAGE BLADDER WITH EQUIPMENT WITH ASSISTANCE Description: STG Manage Bladder With Equipment With total Assistance Outcome: Progressing   Problem: RH SKIN INTEGRITY Goal: RH STG SKIN FREE OF INFECTION/BREAKDOWN Description: With min assist  Outcome: Progressing Goal: RH STG MAINTAIN SKIN INTEGRITY WITH ASSISTANCE Description: STG Maintain Skin Integrity With  min Assistance. Outcome: Progressing Goal: RH STG ABLE TO PERFORM INCISION/WOUND CARE W/ASSISTANCE Description: STG Able To Perform Incision/Wound Care With total Assistance. Outcome: Progressing   Problem: RH SAFETY Goal: RH STG ADHERE TO SAFETY PRECAUTIONS W/ASSISTANCE/DEVICE Description: STG Adhere to Safety Precautions With max Assistance/Device. Outcome: Progressing   Problem: RH PAIN MANAGEMENT Goal: RH STG PAIN MANAGED AT OR BELOW PT'S PAIN GOAL Description: Patient will be free of pain Outcome: Progressing   Problem: RH KNOWLEDGE DEFICIT Goal: RH STG INCREASE KNOWLEDGE OF HYPERTENSION Description: Patient and or family will demonstrate understanding of measures to  manage HTN with medications, dietary restrictions and exercise using handouts and educational resources with cues/reminders Outcome: Progressing Goal: RH STG INCREASE KNOWLEDGE OF DYSPHAGIA/FLUID INTAKE Description: Patient and/or family will be knowledgeable of dysphagia fluid types and intake Patient and or family will demonstrate understanding of measures to manage dysphagia dietary restrictions using handouts and educational resources with cues/reminders Outcome: Progressing Goal: RH STG INCREASE KNOWLEGDE OF HYPERLIPIDEMIA Description: Pt and or family will be knowledgeable of the risks of hyperlipidemia and the use of medication and diet to prevent disease and exercise using handouts and educational resources with cues/reminders Outcome: Progressing Goal: RH STG INCREASE KNOWLEDGE OF STROKE PROPHYLAXIS Description: Pt and or family will be knowledgeable regarding prevention of stroke including ews medication and diet management, importance of follow up appointments and medical compliance with cues/reminder assistance Outcome: Progressing   Problem: RH Vision Goal: RH LTG Vision (Specify) Outcome: Progressing

## 2019-09-25 ENCOUNTER — Inpatient Hospital Stay (HOSPITAL_COMMUNITY): Payer: Medicare Other | Admitting: Occupational Therapy

## 2019-09-25 ENCOUNTER — Inpatient Hospital Stay (HOSPITAL_COMMUNITY): Payer: Medicare Other | Admitting: Physical Therapy

## 2019-09-25 ENCOUNTER — Inpatient Hospital Stay (HOSPITAL_COMMUNITY): Payer: Medicare Other | Admitting: Speech Pathology

## 2019-09-25 MED ORDER — OSMOLITE 1.2 CAL PO LIQD: 800.0000 mL | ORAL | Status: DC

## 2019-09-25 MED ORDER — TRAZODONE HCL 50 MG PO TABS
25.0000 mg | ORAL_TABLET | Freq: Every evening | ORAL | Status: DC | PRN
  Administered 2019-09-25: 25 mg via ORAL
  Filled 2019-09-25: qty 1

## 2019-09-25 MED ORDER — OSMOLITE 1.5 CAL PO LIQD
237.0000 mL | Freq: Three times a day (TID) | ORAL | Status: DC
  Administered 2019-09-25: 237 mL

## 2019-09-25 MED ORDER — ADULT MULTIVITAMIN W/MINERALS CH
1.0000 | ORAL_TABLET | Freq: Every day | ORAL | Status: DC
  Administered 2019-09-26: 1
  Filled 2019-09-25: qty 1

## 2019-09-25 NOTE — Progress Notes (Signed)
Occupational Therapy Session Note  Patient Details  Name: [name] MRN: [id number] Date of Birth: 1932-03-30  Today's Date: 09/25/2019 OT Individual Time: 1400-1500 OT Individual Time Calculation (min): 60 min    Short Term Goals: Week 3:  OT Short Term Goal 1 (Week 3): Continue working on downgraded LTGs in preparation for discharge to SNF?  Skilled Therapeutic Interventions/Progress Updates:    Patient seated in TIS w/c, alert with stimulation but drowsy at this time.  She smiles and nods when asked if she would like a shower this afternoon.  No indication of pain t/o session.  Sit pivot transfer w/c to bed max A of one with stand by of 2nd.  Sit to supine max A of one, min A of 2nd.  Doffed LB clothing in supine position max A.  Returned to sitting max A.  She is able to maintain unsupported sitting with min A.  Sit pivot transfer to rolling shower commode chair using towel under buttocks to protect skin with max A of one, min A of 2nd.  Doffed UB clothing max A.  Completed shower seated on reclined shower commode chair with overall max A -  She was able to hold hand held shower and wash cloth but limited participation this session with bathing activity.  She was able to lean forward from backrest of seat to allow for washing her back.  Returned to bed surface to complete LB dressing dependent, completed UB dressing seated edge of bed mod/max A.   She combed her hair in unsupported sitting position with min A at trunk for seated balance.  Returned to supine max A where she remained at close of session.  Bed alarm set, call bell in reach.    Therapy Documentation Precautions:  Precautions Precautions: Fall Precaution Comments: corerack; R inattention, peg tube placement on 9/2 Restrictions Weight Bearing Restrictions: No   Therapy/Group: Individual Therapy  Carlos Levering 09/25/2019, 7:42 AM

## 2019-09-25 NOTE — Progress Notes (Signed)
Occupational Therapy Discharge Summary  Patient Details  Name: [name] MRN: [id number] Date of Birth: [birth year]  Patient has met 58 of 71 long term goals due to improved balance, postural control, ability to compensate for deficits, improved attention and improved awareness. Patient currently able to complete 25% of UB bathing/dressing task and 25% of LB dressing task in supine rolling R<>L. Patient able to complete oral hygiene seated in TIS wc at sink level with Max A and use of suction toothbrush. Patient continues to be limited by fatigue and lethargy requiring cueing for focused and sustained attention to task. Patient demonstrates static sitting balance at EOB with supervision A at best and Mod A to Max A at most with fatigue/lethargy. Patient to discharge at overall Max Assist to Total A level to SNF to maximize independence and decrease caregiver burden.   Reasons goals not met: Patient continues to require Max A to don UB clothing seated in TIS or at EOB with cues for use of hemi dressing technique. Patient also unable to meet toilet transfer goal 2/2 urinary/bowel incontinence.   Recommendation:  Patient will benefit from ongoing skilled OT services in skilled nursing facility setting to continue to advance functional skills in the area of BADL.  Equipment: Please defer to next level of care.   Reasons for discharge: discharge from hospital  Patient/family agrees with progress made and goals achieved: Yes  OT Discharge Precautions/Restrictions  Precautions Precautions: Fall Precaution Comments: R inattention, peg tube placement on 9/2 Restrictions Weight Bearing Restrictions: No Pain Pain Assessment Pain Scale: Faces Pain Score: 0-No pain Faces Pain Scale: No hurt ADL ADL Eating: NPO Grooming: Maximal assistance Where Assessed-Grooming: Sitting at sink Upper Body Bathing: Maximal assistance Where Assessed-Upper Body Bathing: Sitting at sink Lower Body Bathing:  Dependent Where Assessed-Lower Body Bathing: Bed level Upper Body Dressing: Maximal assistance Where Assessed-Upper Body Dressing: Sitting at sink Lower Body Dressing: Maximal assistance Where Assessed-Lower Body Dressing: Bed level Toileting: Dependent Where Assessed-Toileting: Bed level Toilet Transfer: Unable to assess, Other (comment) (Patient incontinent of bowel and bladder) Tub/Shower Transfer: Not assessed Vision Baseline Vision/History: Wears glasses Wears Glasses: Reading only Patient Visual Report: Other (comment) Vision Assessment?: Vision impaired- to be further tested in functional context;Yes Eye Alignment: Impaired (comment) Ocular Range of Motion: Within Functional Limits Alignment/Gaze Preference: Gaze left Tracking/Visual Pursuits: Decreased smoothness of horizontal tracking;Decreased smoothness of vertical tracking Saccades: Additional eye shifts occurred during testing Visual Fields: Impaired-to be further tested in functional context Perception  Perception: Impaired Praxis Praxis: Impaired Praxis Impairment Details: Motor planning;Initiation Praxis-Other Comments: Increased time for processing Cognition Overall Cognitive Status: Impaired/Different from baseline Arousal/Alertness: Lethargic Orientation Level: Oriented to person Attention: Sustained Focused Attention: Appears intact Focused Attention Impairment: Verbal basic Sustained Attention: Impaired Sustained Attention Impairment: Verbal basic;Functional basic Memory Recall Sock:  (Unable to assess) Memory Recall Blue:  (Unable to assess) Memory Recall Bed:  (Unable to assess) Awareness: Impaired Awareness Impairment: Intellectual impairment;Emergent impairment Problem Solving: Impaired Problem Solving Impairment: Functional basic Safety/Judgment: Impaired Comments: 2/2 lethargy Sensation Sensation Light Touch: Impaired Detail Light Touch Impaired Details: Impaired RUE;Impaired  RLE Proprioception: Impaired Detail Proprioception Impaired Details: Impaired RUE;Impaired RLE Coordination Gross Motor Movements are Fluid and Coordinated: No Fine Motor Movements are Fluid and Coordinated: No Coordination and Movement Description: no active movement detected in the right UE/ LE Motor  Motor Motor: Hemiplegia;Motor apraxia;Abnormal postural alignment and control Motor - Discharge Observations: Dense R hemiplegia Mobility  Bed Mobility Bed Mobility: Rolling Right;Rolling Left;Left Sidelying to  Sit;Sitting - Scoot to [employer] of Bed Rolling Right: Maximal Assistance - Patient 25-49% Rolling Left: Total Assistance - Patient < 25% Left Sidelying to Sit: Maximal Assistance - Patient 25-49% Sitting - Scoot to Edge of Bed: Maximal Assistance - Patient 25-49%  Trunk/Postural Assessment  Cervical Assessment Cervical Assessment: Within Functional Limits (Forward head) Thoracic Assessment Thoracic Assessment: Within Functional Limits Lumbar Assessment Lumbar Assessment: Within Functional Limits (Posterior pelvic tilt) Postural Control Postural Control: Deficits on evaluation Trunk Control: Poor to fair Righting Reactions: delayed Protective Responses: absent  Balance Balance Balance Assessed: Yes Static Sitting Balance Static Sitting - Level of Assistance: 5: Stand by assistance Dynamic Sitting Balance Dynamic Sitting - Level of Assistance: 3: Mod assist Extremity/Trunk Assessment RUE Assessment RUE Assessment: Exceptions to [employer] [facility] [facility] General Strength Comments: arthrtis in bilateral hands RUE Body System: Neuro Brunstrum levels for arm and hand: Arm;Hand Brunstrum level for arm: Stage I Presynergy Brunstrum level for hand: Stage I Flaccidity RUE AROM (degrees) RUE Overall AROM Comments: PROM - WFL RUE Tone RUE Tone: Within Functional Limits LUE Assessment LUE Assessment: Within Functional Limits General Strength Comments: Generalized weakness   Sly Parlee R  [name] 09/25/2019, 2:25 PM

## 2019-09-25 NOTE — Progress Notes (Signed)
Patient ID: [name], female   DOB: February 10, 1932, 86 y.o.   MRN: [id number]  According to MD pt is medically ready to transfer to [facility] tomorrow. Have scheduled [facility] for 10:00 pick up. COVID test negative.

## 2019-09-25 NOTE — Progress Notes (Signed)
[place] PHYSICAL MEDICINE & REHABILITATION PROGRESS NOTE   Subjective/Complaints:   ROS: limited due to aphasia   Objective:   No results found. No results for input(s): WBC, HGB, HCT, PLT in the last 72 hours. No results for input(s): NA, K, CL, CO2, GLUCOSE, BUN, CREATININE, CALCIUM in the last 72 hours.  Intake/Output Summary (Last 24 hours) at 09/25/2019 1049 Last data filed at 09/25/2019 0705 Gross per 24 hour  Intake --  Output 1750 ml  Net -1750 ml     Physical Exam: Vital Signs Blood pressure 130/74, pulse (!) 52, temperature 98.3 F (36.8 C), resp. rate 16, height 5\' 5"  (1.651 m), weight 79.3 kg, SpO2 100 %.  General: No acute distress Mood and affect are appropriate Heart: Regular rate and rhythm no rubs murmurs or extra sounds Lungs: Clear to auscultation, breathing unlabored, no rales or wheezes Abdomen: Positive bowel sounds, soft nontender to palpation, nondistended Extremities: No clubbing, cyanosis, or edema Skin: No evidence of breakdown, no evidence of rash    Skin: Ecchymosis on forearm, right, no evidence of rash Neurologic: 4/5 in the left deltoid bicep tricep grip hip flexion knee extension ankle flexion Medical extremity without active movement right lower extremity has trace ankle plantarflexion  Musculoskeletal:No joint swelling, patient with poor bed positioning, leans toward the right side    Assessment/Plan: 1. Functional deficits secondary to Left BG /MCA infarct  which require 3+ hours per day of interdisciplinary therapy in a comprehensive inpatient rehab setting.  Physiatrist is providing close team supervision and 24 hour management of active medical problems listed below.  Physiatrist and rehab team continue to assess barriers to discharge/monitor patient progress toward functional and medical goals  Care Tool:  Bathing    Body parts bathed by patient: Chest, Abdomen, Right upper leg, Left upper leg, Face   Body parts bathed  by helper: Right arm, Left arm, Front perineal area, Buttocks, Left lower leg, Right lower leg     Bathing assist Assist Level: Maximal Assistance - Patient 24 - 49%     Upper Body Dressing/Undressing Upper body dressing   What is the patient wearing?: Pull over shirt    Upper body assist Assist Level: Maximal Assistance - Patient 25 - 49%    Lower Body Dressing/Undressing Lower body dressing      What is the patient wearing?: Pants, Incontinence brief     Lower body assist Assist for lower body dressing: 2 Helpers     Toileting Toileting    Toileting assist Assist for toileting: 2 Helpers     Transfers Chair/bed transfer  Transfers assist  Chair/bed transfer activity did not occur: Safety/medical concerns (would recommend lift for patient- needed skilled intervention to transfer)  Chair/bed transfer assist level: 2 Helpers     Locomotion Ambulation   Ambulation assist   Ambulation activity did not occur: Safety/medical concerns          Walk 10 feet activity   Assist  Walk 10 feet activity did not occur: Safety/medical concerns        Walk 50 feet activity   Assist Walk 50 feet with 2 turns activity did not occur: Safety/medical concerns         Walk 150 feet activity   Assist Walk 150 feet activity did not occur: Safety/medical concerns         Walk 10 feet on uneven surface  activity   Assist Walk 10 feet on uneven surfaces activity did not occur: Safety/medical concerns  Wheelchair     Assist Will patient use wheelchair at discharge?: Yes      Wheelchair assist level: Dependent - Patient 0% Max wheelchair distance: 150    Wheelchair 50 feet with 2 turns activity    Assist        Assist Level: Dependent - Patient 0%   Wheelchair 150 feet activity     Assist      Assist Level: Dependent - Patient 0%   Blood pressure 130/74, pulse (!) 52, temperature 98.3 F (36.8 C), resp. rate 16,  height 5\' 5"  (1.651 m), weight 79.3 kg, SpO2 100 %.  Medical Problem List and Plan: 1.Right side weakness and aphasiasecondary to left MCA territory infarction status post IR revascularization with SAH -patient may Shower if has good enough sitting balance -ELOS/Goals: Plan for discharge 9/7 to SNF,     Continue CIR 2. Antithrombotics: -DVT/anticoagulation:SCDs- had petechial hemrrhages -antiplatelet therapy: Aspirin 81 mg daily 3. Pain Management:will schedule  Tylenol q6 hours through NGT. Well controlled.   9/4- will add lidoderm 2 patches 12 hr son; 12 hrs off  9/5- changed patches to 8am-8pm- can be left or right- and daughter can use REGULAR salonpas at night.   4. Mood:Zoloft 50 mg daily, Aricept, February 9 10 mg nightly-  -Methylphenidate increase to 10 mg  BID  9/4- can try to add Amantadine 100 mg daily- just in case- already on ritalin 10 mg BID  9/5- since already on Amantadine in past, increased to 200 mg daily-starting Monday- IS more attentive- not talking much- said- "OK" -antipsychotic agents: N/A 5. Neuropsych: This patientisNOTcapable of making decisions on herown behalf. 6. Skin/Wound Care:Routine skin checks 7. Fluids/Electrolytes/Nutrition:Routine in and outs with follow-up chemistries, Change TF times 8. Dysphagia. S/p PEG - see if pt tolerates bolus feeds today otherwise will do nocturnal feeds at SNF 9. Hypertension. Lopressor 50 mg twice daily, Cozaar 25 mg daily, Norvasc 10 mg daily.  Vitals:   09/24/19 1943 09/25/19 0507  BP: (!) 164/73 130/74  Pulse: 87 (!) 52  Resp: 17 16  Temp: 97.9 F (36.6 C) 98.3 F (36.8 C)  SpO2: 96% 100%  suboptimal  control 9/2, starting flomax for bladder  Monitor BP effects   9/6- BP well controlled- con't regimen 10. Hyperlipidemia. Lipitor 11. History of alcohol use. Provide counseling with patient and family 11. Atrial fibrillation. No  anticoagulation at this time.   Continue beta-blocker  HR controlled 8/28. 13. Urinary retention/proteus UTI- discussed with daughter this may cause a temporary setback in recovery   Recheck UA + for bact and WBCs, empiric keflex until the culture is back  Increase ICP freq due to high volumes   urecholine to help with emptying- increased to 25mg  8/24, Flomax started 9/2  -purewick if needed to keep skin dry, but mostly retaining right now 14. Sleep-/insomnia- pt took 30, not 3 mg of Melatonin nightly at home AND 3+ pills of ZZZquil at home to sleep nightly per daughter- Juluis Rainier  8/28: daughter is concerned regarding sleep apnea. Would like outpatient sleep study.   Sleep  Graph   15.  Constipation small daily BM will check KUB, schedule senokot, (not senna S due to mushy stools), may cause some GI discomfort, Already on Protonix given history of alcohol abuse with potential for gastritis  9/5- having loose stools per family.  LOS: 18 days A FACE TO [place] E Aeva Posey 09/25/2019, 10:49 AM

## 2019-09-25 NOTE — Progress Notes (Signed)
Nutrition Follow-up  DOCUMENTATION CODES:   Not applicable  INTERVENTION:   Transition to bolus TF regimen via PEG: - Allow pt to eat from meal tray. If pt consumes </= 50% of meal, provide bolus of Osmolite 1.5 cal 237 ml (1 carton) TID after meal - Always provide HS bolus of Osmolite 1.5 cal 237 ml (1 carton) - ProSource TF 45 ml TID per tube - Free waterflushes of200 ml q 6 hours  Full bolus tube feeding regimenand free water provides1542kcal,92grams of protein, and1558ml of H2O(meets 93% of kcal and 100% of protein needs).  -MagicCup TID with meals, each supplement provides 290 kcal and 9 grams of protein  NUTRITION DIAGNOSIS:   Inadequate oral intake related to dysphagia, lethargy/confusion as evidenced by NPO status.  Progressing, pt now on dysphagia 1 diet with nectar-thick liquids  GOAL:   Patient will meet greater than or equal to 90% of their needs  Met via TF  MONITOR:   PO intake, Supplement acceptance, Diet advancement, Weight trends, Labs, TF tolerance  REASON FOR ASSESSMENT:   Consult Assessment of nutrition requirement/status, Poor PO  ASSESSMENT:   86 year old female with PMH of atrial fibrillation, HTN, HLD, CAD, mild cognitive impairment of vascular etiology complicated by EtOH use. Presented 09/02/19 with right-sided weakness and aphasia. 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 Near occlusive thrombus within the proximal left M1 MCA extending into M2 branch. Pt underwent complete revascularization per IR and was intubated for a short time. Follow-up head CT 09/06/19 after revascularization procedure showed petechial hemorrhage at the basal ganglia and small subarachnoid hemorrhage along the sylvian fissure. Scan completed again on 09/06/19 showing no evidence of new hemorrhage. Currently NPO with Cortrak and tube feeds. Admitted to CIR at 09/07/19.  8/23 - MBS, diet advanced to dysphagia 1 with nectar-thick liquids 8/24  - transitioned to nocturnal TF 9/03 - s/p PEG tube placement 9/04 - Cortrak removed  Noted target d/c date of 09/26/19 to SNF.  RD consulted to transition pt to bolus TF regimen. Discussed plan with RN. Cortrak has been removed and pt tolerating nocturnal feeds via PEG. Plan is to allow pt to eat from meal tray and if pt eats </= 50% of meal, she will receive a bolus of TF via PEG. Will continue with current free water flushes.  Spoke with pt's daughter Casimer Bilis via phone call. Casimer Bilis expresses concern regarding 17 lb weight loss. Reviewed weights in chart and did not note a 17 lb weight loss. Suspect error with bed scale calibration.  CIR admit weight: 78 kg Current weight: 79.3 kg  Discussed bolus TF regimen with Casimer Bilis. All questions answered.  Current TF: Osmolite 1.2 @ 65 ml/hr for 10 hours from 1900 to 0500, ProSource TF 45 ml TID, free water 200 ml q 6 hours  Meal Completion: 0-50% x last 8 recorded meals  Medications reviewed and include: ritalin, liquid MVI, protonix, vitamin B-12  Labs reviewed: sodium 131  UOP: 1250 ml x 24 hours  Diet Order:   Diet Order            DIET - DYS 1 Room service appropriate? Yes with Assist; Fluid consistency: Nectar Thick  Diet effective now                 EDUCATION NEEDS:   Education needs have been addressed  Skin:  Skin Assessment: Skin Integrity Issues: Incisions: abdomen s/p PEG  Last BM:  09/25/19 type 7  Height:   Ht Readings from  Last 1 Encounters:  09/08/19 $RemoveB'5\' 5"'judAgndn$  (1.651 m)    Weight:   Wt Readings from Last 1 Encounters:  09/25/19 79.3 kg    Ideal Body Weight:  56.8 kg  BMI:  Body mass index is 29.09 kg/m.  Estimated Nutritional Needs:   Kcal:  1650-1850  Protein:  85-100 grams  Fluid:  1.6-1.8 L    Gaynell Face, MS, RD, LDN Inpatient Clinical Dietitian Please see AMiON for contact information.

## 2019-09-25 NOTE — Progress Notes (Signed)
Speech Language Pathology Daily Session Note  Patient Details  Name: [name] MRN: [id number] Date of Birth: 04-03-1932  Today's Date: 09/25/2019 SLP Individual Time: 6073-7106 SLP Individual Time Calculation (min): 41 min  Short Term Goals: Week 3: SLP Short Term Goal 1 (Week 3): Pt will consume current diet with Mod A verbal/visual cues for implementation of compensatory swallow strategies for oral clearance and minimize aspiration risk. SLP Short Term Goal 2 (Week 3): Pt will demonstrate efficient mastication and oral clearance of Dys 2 solid trials a minimum of 2 times prior to diet advancement. SLP Short Term Goal 3 (Week 3): Pt will communicate basic wants and needs at the word level with Mod A verbal and/or visual cues. SLP Short Term Goal 4 (Week 3): Pt will name common objects with 80%  accuracy provided Mod A multimodal cues. SLP Short Term Goal 5 (Week 3): Pt will match words to common objects with 60% accuracy with Max A multimodal cues. SLP Short Term Goal 6 (Week 3): Pt will demonstrate awareness of functional and/or verbal errors with Max A multimodal cues.  Skilled Therapeutic Interventions: Pt was seen for skilled ST targeting communication and dysphagia goals. Pt mostly communicating via head nods and other comparable gestures with Mod A multimodal cues from clinician. She was able to repeat and verbalize at the word and phrase level to answer SLP and daughter's questions with Max A semantic cues. She was oriented to self only this morning. Pt indicated interest in PO intake, therefore SLP provided pudding and nectar thick apple juice snack. Pt consumed ~2oz nectar thick juice via tsp with Min A verbal cues for swallow strategies. Pt declined further intake after only 1 bite of pudding, but full oral clearance achieved and AP transit seemed timely upon bedside observation. No overt s/sx aspiration observed across liquids or solids. Pt left laying in bed with alarm set, needs  within reach, all daughter's questions answered to her satisfaction. Continue per current plan of care.          Pain Pain Assessment Pain Scale: Faces Faces Pain Scale: No hurt   Therapy/Group: Individual Therapy  Arbutus Leas 09/25/2019, 12:20 PM

## 2019-09-25 NOTE — Progress Notes (Signed)
Physical Therapy Session Note  Patient Details  Name: [name] MRN: [id number] Date of Birth: 09-Mar-1932  Today's Date: 09/25/2019 PT Individual Time: 1120-1200 PT Individual Time Calculation (min): 40 min   Short Term Goals: Week 3:  PT Short Term Goal 1 (Week 3): STG=LTG due to ELOS with d/c to SNF  Skilled Therapeutic Interventions/Progress Updates: Pt presented in bed with dgt present agreeable to therapy. PTA removed PRAFO and threaded pants total A. Pt was able to perform small bridge x 3 with PTA blocking R foot to allow PTA to pull pants over hips. Pt then performed supine to sit at EOB with maxA. Pt initially required modA for sitting balance due to pushing but was able to stabilize and after 2-3 min was able to improve to CGA. Pt then performed stand pivot transfer to TIS with max to total A with PTA blocking R knee and facilitating wt shifting a pivot to TIS. Pt then transported to ortho gym and participated in Dynavision mod A x 3 min with pt able to perform reaching outside BOS with overall CGA while in TIS but also able to return to neutral with minimal cues. Pt was able to perform x 3 min with 3.60 sec avg reaction time and required x 5 cues when scanning to R to reach target (further outside BOS). Pt then transported to day room and participated in Cybex Kinetron 15 cycles x 3 at 80cm/sec with pt able to perform sustained activity with minimal cues. Pt transported back to room at end of session and left with belt alarm on, call bell within reach and needs met.      Therapy Documentation Precautions:  Precautions Precautions: Fall Precaution Comments: R inattention, peg tube placement on 9/2 Restrictions Weight Bearing Restrictions: No General:   Vital Signs: Therapy Vitals Temp: 98.1 F (36.7 C) Pulse Rate: 81 Resp: 16 BP: 134/81 Patient Position (if appropriate): Lying Oxygen Therapy SpO2: 100 % O2 Device: Room Air Pain:   Mobility:   Locomotion :     Trunk/Postural Assessment : Cervical Assessment Cervical Assessment: Within Functional Limits (Forward head) Thoracic Assessment Thoracic Assessment: Within Functional Limits Lumbar Assessment Lumbar Assessment: Within Functional Limits (Posterior pelvic tilt) Postural Control Postural Control: Deficits on evaluation Trunk Control: Poor to fair Righting Reactions: delayed Protective Responses: absent  Balance: Balance Balance Assessed: Yes Static Sitting Balance Static Sitting - Level of Assistance: 5: Stand by assistance Dynamic Sitting Balance Dynamic Sitting - Level of Assistance: 3: Mod assist Exercises:   Other Treatments:      Therapy/Group: Individual Therapy  Elvira Langston 09/25/2019, 4:20 PM

## 2019-09-25 NOTE — Progress Notes (Signed)
Speech Language Pathology Discharge Summary  Patient Details  Name: [name] MRN: [id number] Date of Birth: Aug 07, 1932   Patient has met 7 of 7 long term goals.  Patient to discharge at overall Min;Mod;Max level.  Reasons goals not met: n/a   Clinical Impression/Discharge Summary:   Pt made very slow functional gains and goals were downgraded during her stay, however she ultimately met 7 out of 7 applicable long term goals this admission. Pt currently requires Mod-Max assist for basic familiar tasks and functional communication due to lower level cognitive impairments impacting her sustained attention, arousal, problem solving, initiation, and awareness, as well as mixed expressive/receptive aphasia. Pt's verbal output has been limited and she mostly communicates wants and needs via gestures and yes/no responses when questions regarding needs are rephrased in the appropriate formate. She requires Moderate assistance to sustained attention to functional tasks and Max A for awareness of verbal and functional errors. Due to severity of impairments, pt will require 24/7 supervision at discharge, and next venue of care recommended is SNF. Pt is consuming Dysphagia 1 (puree) texture diet with nectar thick liquids; a PEG was also placed due to pt's limited PO intake. Repeat MBSS prior to d/c was not appropriate given current bedside presentation, however would recommend repeat MBSS as outpatient prior to further diet advancement. Pt will continue to benefit from skilled ST services for dysphagia, aphasia, and cognitive deficits at SNF upon discharge. Pt and family education has been ongoing is complete at this level of care at this time. Family will benefit from further education at SNF prior to d/c home.    Care Partner:  Caregiver Able to Provide Assistance: No  Type of Caregiver Assistance:  (n/a - d/c to SNF)  Recommendation:  Skilled Nursing facility;24 hour supervision/assistance  Rationale  for SLP Follow Up: Maximize functional communication;Maximize swallowing safety;Maximize cognitive function and independence;Reduce caregiver burden   Equipment: none   Reasons for discharge: Discharged from hospital   Patient/Family Agrees with Progress Made and Goals Achieved: Yes    Arbutus Leas 09/25/2019, 12:22 PM

## 2019-09-26 DIAGNOSIS — M810 Age-related osteoporosis without current pathological fracture: Secondary | ICD-10-CM | POA: Diagnosis present

## 2019-09-26 DIAGNOSIS — R338 Other retention of urine: Secondary | ICD-10-CM | POA: Diagnosis not present

## 2019-09-26 DIAGNOSIS — R1319 Other dysphagia: Secondary | ICD-10-CM | POA: Diagnosis not present

## 2019-09-26 DIAGNOSIS — R1031 Right lower quadrant pain: Secondary | ICD-10-CM | POA: Diagnosis not present

## 2019-09-26 DIAGNOSIS — N390 Urinary tract infection, site not specified: Secondary | ICD-10-CM | POA: Diagnosis not present

## 2019-09-26 DIAGNOSIS — Z789 Other specified health status: Secondary | ICD-10-CM | POA: Diagnosis not present

## 2019-09-26 DIAGNOSIS — F5101 Primary insomnia: Secondary | ICD-10-CM | POA: Diagnosis not present

## 2019-09-26 DIAGNOSIS — H409 Unspecified glaucoma: Secondary | ICD-10-CM | POA: Diagnosis not present

## 2019-09-26 DIAGNOSIS — I10 Essential (primary) hypertension: Secondary | ICD-10-CM | POA: Diagnosis not present

## 2019-09-26 DIAGNOSIS — R279 Unspecified lack of coordination: Secondary | ICD-10-CM | POA: Diagnosis not present

## 2019-09-26 DIAGNOSIS — Z8719 Personal history of other diseases of the digestive system: Secondary | ICD-10-CM | POA: Diagnosis not present

## 2019-09-26 DIAGNOSIS — I2510 Atherosclerotic heart disease of native coronary artery without angina pectoris: Secondary | ICD-10-CM | POA: Diagnosis present

## 2019-09-26 DIAGNOSIS — R293 Abnormal posture: Secondary | ICD-10-CM | POA: Diagnosis not present

## 2019-09-26 DIAGNOSIS — N3000 Acute cystitis without hematuria: Secondary | ICD-10-CM | POA: Diagnosis not present

## 2019-09-26 DIAGNOSIS — G4709 Other insomnia: Secondary | ICD-10-CM | POA: Diagnosis not present

## 2019-09-26 DIAGNOSIS — Z7901 Long term (current) use of anticoagulants: Secondary | ICD-10-CM | POA: Diagnosis not present

## 2019-09-26 DIAGNOSIS — R1310 Dysphagia, unspecified: Secondary | ICD-10-CM | POA: Diagnosis not present

## 2019-09-26 DIAGNOSIS — Z7902 Long term (current) use of antithrombotics/antiplatelets: Secondary | ICD-10-CM | POA: Diagnosis not present

## 2019-09-26 DIAGNOSIS — R7309 Other abnormal glucose: Secondary | ICD-10-CM | POA: Diagnosis not present

## 2019-09-26 DIAGNOSIS — R2689 Other abnormalities of gait and mobility: Secondary | ICD-10-CM | POA: Diagnosis not present

## 2019-09-26 DIAGNOSIS — K5909 Other constipation: Secondary | ICD-10-CM | POA: Diagnosis not present

## 2019-09-26 DIAGNOSIS — E871 Hypo-osmolality and hyponatremia: Secondary | ICD-10-CM | POA: Diagnosis present

## 2019-09-26 DIAGNOSIS — K5900 Constipation, unspecified: Secondary | ICD-10-CM | POA: Diagnosis present

## 2019-09-26 DIAGNOSIS — G47429 Narcolepsy in conditions classified elsewhere without cataplexy: Secondary | ICD-10-CM | POA: Diagnosis not present

## 2019-09-26 DIAGNOSIS — E876 Hypokalemia: Secondary | ICD-10-CM | POA: Diagnosis not present

## 2019-09-26 DIAGNOSIS — F0150 Vascular dementia without behavioral disturbance: Secondary | ICD-10-CM | POA: Diagnosis not present

## 2019-09-26 DIAGNOSIS — M6281 Muscle weakness (generalized): Secondary | ICD-10-CM | POA: Diagnosis not present

## 2019-09-26 DIAGNOSIS — R7989 Other specified abnormal findings of blood chemistry: Secondary | ICD-10-CM | POA: Diagnosis not present

## 2019-09-26 DIAGNOSIS — K5641 Fecal impaction: Secondary | ICD-10-CM | POA: Diagnosis not present

## 2019-09-26 DIAGNOSIS — K219 Gastro-esophageal reflux disease without esophagitis: Secondary | ICD-10-CM | POA: Diagnosis not present

## 2019-09-26 DIAGNOSIS — R634 Abnormal weight loss: Secondary | ICD-10-CM | POA: Diagnosis not present

## 2019-09-26 DIAGNOSIS — Z7982 Long term (current) use of aspirin: Secondary | ICD-10-CM | POA: Diagnosis not present

## 2019-09-26 DIAGNOSIS — I69951 Hemiplegia and hemiparesis following unspecified cerebrovascular disease affecting right dominant side: Secondary | ICD-10-CM | POA: Diagnosis not present

## 2019-09-26 DIAGNOSIS — Z9049 Acquired absence of other specified parts of digestive tract: Secondary | ICD-10-CM | POA: Diagnosis not present

## 2019-09-26 DIAGNOSIS — K5790 Diverticulosis of intestine, part unspecified, without perforation or abscess without bleeding: Secondary | ICD-10-CM | POA: Diagnosis not present

## 2019-09-26 DIAGNOSIS — Z90710 Acquired absence of both cervix and uterus: Secondary | ICD-10-CM | POA: Diagnosis not present

## 2019-09-26 DIAGNOSIS — F329 Major depressive disorder, single episode, unspecified: Secondary | ICD-10-CM | POA: Diagnosis not present

## 2019-09-26 DIAGNOSIS — Z961 Presence of intraocular lens: Secondary | ICD-10-CM | POA: Diagnosis not present

## 2019-09-26 DIAGNOSIS — E46 Unspecified protein-calorie malnutrition: Secondary | ICD-10-CM | POA: Diagnosis not present

## 2019-09-26 DIAGNOSIS — H524 Presbyopia: Secondary | ICD-10-CM | POA: Diagnosis not present

## 2019-09-26 DIAGNOSIS — K08409 Partial loss of teeth, unspecified cause, unspecified class: Secondary | ICD-10-CM | POA: Diagnosis not present

## 2019-09-26 DIAGNOSIS — N320 Bladder-neck obstruction: Secondary | ICD-10-CM | POA: Diagnosis not present

## 2019-09-26 DIAGNOSIS — K5731 Diverticulosis of large intestine without perforation or abscess with bleeding: Secondary | ICD-10-CM | POA: Diagnosis present

## 2019-09-26 DIAGNOSIS — G478 Other sleep disorders: Secondary | ICD-10-CM | POA: Diagnosis not present

## 2019-09-26 DIAGNOSIS — F5104 Psychophysiologic insomnia: Secondary | ICD-10-CM | POA: Diagnosis not present

## 2019-09-26 DIAGNOSIS — K921 Melena: Secondary | ICD-10-CM | POA: Diagnosis present

## 2019-09-26 DIAGNOSIS — K9289 Other specified diseases of the digestive system: Secondary | ICD-10-CM | POA: Diagnosis not present

## 2019-09-26 DIAGNOSIS — M5432 Sciatica, left side: Secondary | ICD-10-CM | POA: Diagnosis not present

## 2019-09-26 DIAGNOSIS — R278 Other lack of coordination: Secondary | ICD-10-CM | POA: Diagnosis not present

## 2019-09-26 DIAGNOSIS — R5381 Other malaise: Secondary | ICD-10-CM | POA: Diagnosis not present

## 2019-09-26 DIAGNOSIS — K625 Hemorrhage of anus and rectum: Secondary | ICD-10-CM | POA: Diagnosis not present

## 2019-09-26 DIAGNOSIS — G3109 Other frontotemporal dementia: Secondary | ICD-10-CM | POA: Diagnosis not present

## 2019-09-26 DIAGNOSIS — R1084 Generalized abdominal pain: Secondary | ICD-10-CM | POA: Diagnosis not present

## 2019-09-26 DIAGNOSIS — R1030 Lower abdominal pain, unspecified: Secondary | ICD-10-CM | POA: Diagnosis not present

## 2019-09-26 DIAGNOSIS — F331 Major depressive disorder, recurrent, moderate: Secondary | ICD-10-CM | POA: Diagnosis not present

## 2019-09-26 DIAGNOSIS — R4189 Other symptoms and signs involving cognitive functions and awareness: Secondary | ICD-10-CM | POA: Diagnosis present

## 2019-09-26 DIAGNOSIS — B964 Proteus (mirabilis) (morganii) as the cause of diseases classified elsewhere: Secondary | ICD-10-CM | POA: Diagnosis not present

## 2019-09-26 DIAGNOSIS — I2699 Other pulmonary embolism without acute cor pulmonale: Secondary | ICD-10-CM | POA: Diagnosis present

## 2019-09-26 DIAGNOSIS — R339 Retention of urine, unspecified: Secondary | ICD-10-CM | POA: Diagnosis not present

## 2019-09-26 DIAGNOSIS — Z466 Encounter for fitting and adjustment of urinary device: Secondary | ICD-10-CM | POA: Diagnosis not present

## 2019-09-26 DIAGNOSIS — H401134 Primary open-angle glaucoma, bilateral, indeterminate stage: Secondary | ICD-10-CM | POA: Diagnosis not present

## 2019-09-26 DIAGNOSIS — G459 Transient cerebral ischemic attack, unspecified: Secondary | ICD-10-CM | POA: Diagnosis not present

## 2019-09-26 DIAGNOSIS — R1312 Dysphagia, oropharyngeal phase: Secondary | ICD-10-CM | POA: Diagnosis not present

## 2019-09-26 DIAGNOSIS — I6990 Unspecified sequelae of unspecified cerebrovascular disease: Secondary | ICD-10-CM | POA: Diagnosis not present

## 2019-09-26 DIAGNOSIS — N1330 Unspecified hydronephrosis: Secondary | ICD-10-CM | POA: Diagnosis not present

## 2019-09-26 DIAGNOSIS — I69351 Hemiplegia and hemiparesis following cerebral infarction affecting right dominant side: Secondary | ICD-10-CM | POA: Diagnosis not present

## 2019-09-26 DIAGNOSIS — I4891 Unspecified atrial fibrillation: Secondary | ICD-10-CM | POA: Diagnosis present

## 2019-09-26 DIAGNOSIS — I69328 Other speech and language deficits following cerebral infarction: Secondary | ICD-10-CM | POA: Diagnosis not present

## 2019-09-26 DIAGNOSIS — R488 Other symbolic dysfunctions: Secondary | ICD-10-CM | POA: Diagnosis not present

## 2019-09-26 DIAGNOSIS — I63512 Cerebral infarction due to unspecified occlusion or stenosis of left middle cerebral artery: Secondary | ICD-10-CM | POA: Diagnosis not present

## 2019-09-26 DIAGNOSIS — I69391 Dysphagia following cerebral infarction: Secondary | ICD-10-CM | POA: Diagnosis not present

## 2019-09-26 DIAGNOSIS — G3184 Mild cognitive impairment, so stated: Secondary | ICD-10-CM | POA: Diagnosis not present

## 2019-09-26 DIAGNOSIS — G8191 Hemiplegia, unspecified affecting right dominant side: Secondary | ICD-10-CM | POA: Diagnosis not present

## 2019-09-26 DIAGNOSIS — R109 Unspecified abdominal pain: Secondary | ICD-10-CM | POA: Diagnosis not present

## 2019-09-26 DIAGNOSIS — Z885 Allergy status to narcotic agent status: Secondary | ICD-10-CM | POA: Diagnosis not present

## 2019-09-26 DIAGNOSIS — D62 Acute posthemorrhagic anemia: Secondary | ICD-10-CM | POA: Diagnosis not present

## 2019-09-26 DIAGNOSIS — Z20822 Contact with and (suspected) exposure to covid-19: Secondary | ICD-10-CM | POA: Diagnosis not present

## 2019-09-26 DIAGNOSIS — Z431 Encounter for attention to gastrostomy: Secondary | ICD-10-CM | POA: Diagnosis not present

## 2019-09-26 DIAGNOSIS — E785 Hyperlipidemia, unspecified: Secondary | ICD-10-CM | POA: Diagnosis not present

## 2019-09-26 DIAGNOSIS — Z743 Need for continuous supervision: Secondary | ICD-10-CM | POA: Diagnosis not present

## 2019-09-26 MED ORDER — RESOURCE THICKENUP CLEAR PO POWD: 1.0000 | ORAL | 1 refills | Status: AC | PRN

## 2019-09-26 MED ORDER — BETHANECHOL CHLORIDE 25 MG PO TABS: 25.0000 mg | ORAL_TABLET | Freq: Three times a day (TID) | ORAL | Status: AC

## 2019-09-26 MED ORDER — CEPHALEXIN 250 MG PO CAPS: 250.0000 mg | ORAL_CAPSULE | Freq: Three times a day (TID) | ORAL | Status: AC

## 2019-09-26 MED ORDER — METHYLPHENIDATE HCL 10 MG PO TABS: 10.0000 mg | ORAL_TABLET | Freq: Two times a day (BID) | ORAL | 0 refills | Status: AC

## 2019-09-26 MED ORDER — AMANTADINE HCL 50 MG/5ML PO SYRP: 200.0000 mg | ORAL_SOLUTION | Freq: Every day | ORAL | 0 refills | Status: AC

## 2019-09-26 MED ORDER — LIDOCAINE 5 % EX PTCH: 2.0000 | MEDICATED_PATCH | CUTANEOUS | 0 refills | Status: AC

## 2019-09-26 NOTE — Progress Notes (Signed)
[place] PHYSICAL MEDICINE & REHABILITATION PROGRESS NOTE   Subjective/Complaints:  Non verbal, daughter with multiple questions regarding progrnosis for aphasia, cognition, mobility, urinary incont, bowel cont, swallow impairment ROS: limited due to aphasia   Objective:   No results found. No results for input(s): WBC, HGB, HCT, PLT in the last 72 hours. No results for input(s): NA, K, CL, CO2, GLUCOSE, BUN, CREATININE, CALCIUM in the last 72 hours.  Intake/Output Summary (Last 24 hours) at 09/26/2019 0805 Last data filed at 09/26/2019 0640 Gross per 24 hour  Intake 120 ml  Output 1800 ml  Net -1680 ml     Physical Exam: Vital Signs Blood pressure (!) 157/73, pulse 95, temperature 97.6 F (36.4 C), resp. rate 16, height 5\' 5"  (1.651 m), weight 80.6 kg, SpO2 (!) 84 %.  General: No acute distress Mood and affect are appropriate Heart: Regular rate and rhythm no rubs murmurs or extra sounds Lungs: Clear to auscultation, breathing unlabored, no rales or wheezes Abdomen: Positive bowel sounds, soft nontender to palpation, nondistended Extremities: No clubbing, cyanosis, or edema   Skin: Ecchymosis on forearm, right, no evidence of rash Neurologic: 4/5 in the left deltoid bicep tricep grip hip flexion knee extension ankle flexion Medical extremity without active movement right lower extremity has trace ankle plantarflexion  Musculoskeletal:No joint swelling, patient with poor bed positioning, leans toward the right side    Assessment/Plan: 1. Functional deficits secondary to Left BG /MCA infarct Stable for D/C today F/u MD in SNF F/u PM&R 4 weeks F/u urology See D/C summary See D/C instructions  Care Tool:  Bathing    Body parts bathed by patient: Chest, Right upper leg, Left upper leg, Face   Body parts bathed by helper: Right arm, Left arm, Chest, Abdomen, Front perineal area, Buttocks, Right upper leg, Left upper leg, Right lower leg, Left lower leg, Face      Bathing assist Assist Level: Total Assistance - Patient < 25%     Upper Body Dressing/Undressing Upper body dressing   What is the patient wearing?: Pull over shirt    Upper body assist Assist Level: Maximal Assistance - Patient 25 - 49%    Lower Body Dressing/Undressing Lower body dressing      What is the patient wearing?: Pants, Incontinence brief     Lower body assist Assist for lower body dressing: Total Assistance - Patient < 25%     Toileting Toileting    Toileting assist Assist for toileting: Total Assistance - Patient < 25%     Transfers Chair/bed transfer  Transfers assist  Chair/bed transfer activity did not occur: Safety/medical concerns (would recommend lift for patient- needed skilled intervention to transfer)  Chair/bed transfer assist level: Total Assistance - Patient < 25%     Locomotion Ambulation   Ambulation assist   Ambulation activity did not occur: Safety/medical concerns          Walk 10 feet activity   Assist  Walk 10 feet activity did not occur: Safety/medical concerns        Walk 50 feet activity   Assist Walk 50 feet with 2 turns activity did not occur: Safety/medical concerns         Walk 150 feet activity   Assist Walk 150 feet activity did not occur: Safety/medical concerns         Walk 10 feet on uneven surface  activity   Assist Walk 10 feet on uneven surfaces activity did not occur: Safety/medical concerns  Wheelchair     Assist Will patient use wheelchair at discharge?: Yes      Wheelchair assist level: Dependent - Patient 0% Max wheelchair distance: 150    Wheelchair 50 feet with 2 turns activity    Assist        Assist Level: Dependent - Patient 0%   Wheelchair 150 feet activity     Assist      Assist Level: Dependent - Patient 0%   Blood pressure (!) 157/73, pulse 95, temperature 97.6 F (36.4 C), resp. rate 16, height 5\' 5"  (1.651 m), weight 80.6 kg,  SpO2 (!) 84 %.  Medical Problem List and Plan: 1.Right side weakness and aphasiasecondary to left MCA territory infarction status post IR revascularization with [facility] -patient may Shower if has good enough sitting balance -ELOS/Goals: Plan for discharge 9/7 to SNF,     Continue CIR 2. Antithrombotics: -DVT/anticoagulation:SCDs- had petechial hemrrhages -antiplatelet therapy: Aspirin 81 mg daily 3. Pain Management:will schedule  Tylenol q6 hours through NGT. Well controlled.   9/4- will add lidoderm 2 patches 12 hr son; 12 hrs off  9/5- changed patches to 8am-8pm- can be left or right- and daughter can use REGULAR salonpas at night.   4. Mood:Zoloft 50 mg daily, Aricept, February 9 10 mg nightly-  -Methylphenidate increase to 10 mg  BID  Also restarted on amantadine 5. Neuropsych: This patientisNOTcapable of making decisions on herown behalf. 6. Skin/Wound Care:Routine skin checks 7. Fluids/Electrolytes/Nutrition:Routine in and outs with follow-up chemistries, Change TF times 8. Dysphagia. S/p PEG - see if pt tolerates bolus feeds today otherwise will do nocturnal feeds at SNF 9. Hypertension. Lopressor 50 mg twice daily, Cozaar 25 mg daily, Norvasc 10 mg daily.  Vitals:   09/25/19 1934 09/26/19 0630  BP: (!) 157/85 (!) 157/73  Pulse: 94 95  Resp: 16 16  Temp: 97.8 F (36.6 C) 97.6 F (36.4 C)  SpO2: 98% (!) 84%   10. Hyperlipidemia. Lipitor 11. History of alcohol use. Provide counseling with patient and family 49. Atrial fibrillation. No anticoagulation at this time.   Continue beta-blocker  HR controlled 8/28. 13. Urinary retention/ Kleb P sens to keflex                                                     14. Sleep-/insomnia- pt took 30, not 3 mg of Melatonin nightly at home AND 3+ pills of ZZZquil at home to sleep nightly per daughter- Juluis Rainier  8/28: daughter is concerned regarding sleep apnea. Would like  outpatient sleep study.   Sleep  Graph   15.  Constipation now bowels are loose with increase in TF volume LOS: 19 days A FACE TO FACE EVALUATION WAS PERFORMED  Charlett Blake 09/26/2019, 8:05 AM

## 2019-09-26 NOTE — Progress Notes (Signed)
Inpatient Rehabilitation Care Coordinator  Discharge Note  The overall goal for the admission was met for:   Discharge location: Yes, SNF  Length of Stay: Yes, 19 Days  Discharge activity level: Yes, Max to total  Home/community participation: Yes  Services provided included: MD, RD, PT, OT, SLP, RN, CM, TR, Pharmacy, Neuropsych and SW  Financial Services: Medicare  Follow-up services arranged: Other: SNF: [facility]  Comments (or additional information):  Patient/Family verbalized understanding of follow-up arrangements: Yes  Individual responsible for coordination of the follow-up plan: Casimer Bilis: [phone]  Confirmed correct DME delivered: [name] 09/26/2019    [name]

## 2019-09-26 NOTE — Discharge Instructions (Signed)
Inpatient Rehab Discharge Instructions  [name] Discharge date and time: No discharge date for patient encounter.   Activities/Precautions/ Functional Status: Activity: activity as tolerated Diet: Dysphagia #1 nectar thick liquid.  Osmolite 237 mL 3 times daily by tube.  Prosource liquid 45 mL 3 times daily by tube.  Free water 200 mL every 6 h x 2 Wound Care: none needed Functional status:  ___ No restrictions     ___ Walk up steps independently ___ 24/7 supervision/assistance   ___ Walk up steps with assistance ___ Intermittent supervision/assistance  ___ Bathe/dress independently ___ Walk with walker     _x__ Bathe/dress with assistance ___ Walk Independently    ___ Shower independently ___ Walk with assistance    ___ Shower with assistance ___ No alcohol     ___ Return to work/school ________  Special Instructions: No driving smoking or alcohol STROKE/TIA DISCHARGE INSTRUCTIONS SMOKING Cigarette smoking nearly doubles your risk of having a stroke & is the single most alterable risk factor  If you smoke or have smoked in the last 12 months, you are advised to quit smoking for your health.  Most of the excess cardiovascular risk related to smoking disappears within a year of stopping.  Ask you doctor about anti-smoking medications  [facility] Quit Line: 1-800-QUIT NOW  Free Smoking Cessation Classes [phone]  CHOLESTEROL Know your levels; limit fat & cholesterol in your diet  Lipid Panel     Component Value Date/Time   CHOL 206 (H) 09/03/2019 0319   TRIG 230 (H) 09/03/2019 0319   HDL 41 09/03/2019 0319   CHOLHDL 5.0 09/03/2019 0319   VLDL 46 (H) 09/03/2019 0319   LDLCALC 119 (H) 09/03/2019 0319      Many patients benefit from treatment even if their cholesterol is at goal.  Goal: Total Cholesterol (CHOL) less than 160  Goal:  Triglycerides (TRIG) less than 150  Goal:  HDL greater than 40  Goal:  LDL (LDLCALC) less than 100   BLOOD PRESSURE American Stroke  Association blood pressure target is less that 120/80 mm/Hg  Your discharge blood pressure is:  BP: (!) 149/78  Monitor your blood pressure  Limit your salt and alcohol intake  Many individuals will require more than one medication for high blood pressure  DIABETES (A1c is a blood sugar average for last 3 months) Goal HGBA1c is under 7% (HBGA1c is blood sugar average for last 3 months)  Diabetes: No known diagnosis of diabetes    Lab Results  Component Value Date   HGBA1C 5.8 (H) 09/03/2019     Your HGBA1c can be lowered with medications, healthy diet, and exercise.  Check your blood sugar as directed by your physician  Call your physician if you experience unexplained or low blood sugars.  PHYSICAL ACTIVITY/REHABILITATION Goal is 30 minutes at least 4 days per week  Activity: Increase activity slowly, Therapies: Physical Therapy: Home Health Return to work:   Activity decreases your risk of heart attack and stroke and makes your heart stronger.  It helps control your weight and blood pressure; helps you relax and can improve your mood.  Participate in a regular exercise program.  Talk with your doctor about the best form of exercise for you (dancing, walking, swimming, cycling).  DIET/WEIGHT Goal is to maintain a healthy weight  Your discharge diet is:  Diet Order    None      liquids Your height is:    Your current weight is:   Your Body  Mass Index (BMI) is:     Following the type of diet specifically designed for you will help prevent another stroke.  Your goal weight range is:    Your goal Body Mass Index (BMI) is 19-24.  Healthy food habits can help reduce 3 risk factors for stroke:  High cholesterol, hypertension, and excess weight.  RESOURCES Stroke/Support Group:  Call [phone]   STROKE EDUCATION PROVIDED/REVIEWED AND GIVEN TO PATIENT Stroke warning signs and symptoms How to activate emergency medical system (call 911). Medications prescribed at  discharge. Need for follow-up after discharge. Personal risk factors for stroke. Pneumonia vaccine given:  Flu vaccine given:  My questions have been answered, the writing is legible, and I understand these instructions.  I will adhere to these goals & educational materials that have been provided to me after my discharge from the hospital.      My questions have been answered and I understand these instructions. I will adhere to these goals and the provided educational materials after my discharge from the hospital.  Patient/Caregiver Signature _______________________________ Date __________  Clinician Signature _______________________________________ Date __________  Please bring this form and your medication list with you to all your follow-up doctor's appointments.

## 2019-09-26 NOTE — Progress Notes (Signed)
Physical Therapy Discharge Summary  Patient Details  Name: [name] MRN: [id number] Date of Birth: 1933-01-08  Today's Date: 09/26/2019    Patient has met 4 of 6 long term goals due to improved activity tolerance, improved balance, improved postural control, improved attention, improved awareness and improved coordination.  Patient to discharge at a wheelchair level Max Assist.   Patient's care partner is independent to provide the necessary physical and cognitive assistance at discharge.  Reasons goals not met: continued pushers syndrome, lethargy, and profound hemiplegia   Recommendation:  Patient will benefit from ongoing skilled PT services in skilled nursing facility setting to continue to advance safe functional mobility, address ongoing impairments in bed mobility, balance, neuromotor control, sensation, safety, gait, transfer, and minimize fall risk.  Equipment: No equipment provided  Reasons for discharge: treatment goals met and discharge from hospital  Patient/family agrees with progress made and goals achieved: Yes  PT Discharge Precautions/Restrictions Precautions Precautions: Fall Precaution Comments: R inattention, peg tube placement on 9/2 Vital Signs Therapy Vitals Temp: 97.6 F (36.4 C) Pulse Rate: 95 Resp: 16 BP: (!) 157/73 Patient Position (if appropriate): Lying Oxygen Therapy SpO2: (!) 84 % O2 Device: Room Air Pain Pain Assessment Pain Scale: 0-10 Pain Score: 0-No pain Faces Pain Scale: No hurt  Cognition Overall Cognitive Status: Impaired/Different from baseline Arousal/Alertness: Lethargic Orientation Level: Oriented to person Sensation Sensation Light Touch: Impaired by gross assessment Light Touch Impaired Details: Impaired RUE;Impaired RLE Proprioception: Impaired by gross assessment Coordination Gross Motor Movements are Fluid and Coordinated: No Coordination and Movement Description: no active movement detected in the right UE/  LE Motor  Motor Motor: Hemiplegia;Motor apraxia;Abnormal postural alignment and control Motor - Discharge Observations: Dense R hemiplegia  Mobility Bed Mobility Bed Mobility: Rolling Right;Rolling Left;Left Sidelying to Sit;Sitting - Scoot to Edge of Bed Rolling Right: Maximal Assistance - Patient 25-49% Rolling Left: Total Assistance - Patient < 25% Left Sidelying to Sit: Maximal Assistance - Patient 25-49% Supine to Sit: Maximal Assistance - Patient - Patient 25-49% Sitting - Scoot to Edge of Bed: Maximal Assistance - Patient 25-49% Transfers Transfers: Lobbyist Pivot Transfers: Maximal Assistance - Patient 25-49% Transfer (Assistive device): None Locomotion  Gait Ambulation: No Gait Gait: No Stairs / Additional Locomotion Stairs: No Wheelchair Mobility Wheelchair Mobility: Yes Wheelchair Assistance: Dependent - Patient 0% Wheelchair Propulsion: Other (comment) Wheelchair Parts Management: Needs assistance Distance: 136ft in TIS  Trunk/Postural Assessment  Cervical Assessment Cervical Assessment: Exceptions to [facility] (forward head) Thoracic Assessment Thoracic Assessment: Exceptions to [facility] (rounded shoulders) Lumbar Assessment Lumbar Assessment: Exceptions to [facility] (posterior pelvic tilt) Postural Control Postural Control: Deficits on evaluation Righting Reactions: delayed Protective Responses: absent  Balance Balance Balance Assessed: Yes Static Sitting Balance Static Sitting - Balance Support: Left upper extremity supported Static Sitting - Level of Assistance: 5: Stand by assistance Static Sitting - Comment/# of Minutes: occasional R lateral lean but able to self correct with minimal verbal cues Dynamic Sitting Balance Dynamic Sitting - Level of Assistance: 4: Min assist Dynamic Sitting - Balance Activities: Reaching for objects;Reaching across midline Sitting balance - Comments: Use of Dynavision to perform reaching activities and was able to  return to midline Static Standing Balance Static Standing - Level of Assistance: 1: +1 Total assist Dynamic Standing Balance Dynamic Standing - Balance Support: Left upper extremity supported Dynamic Standing - Level of Assistance: 2: Max assist;1: +1 Total assist Dynamic Standing - Comments: max to total A when performing stand pivot or at wall rail  Extremity Assessment  RUE Assessment RUE Assessment: Exceptions to [facility] General Strength Comments: arthrtis in bilateral hands LUE Assessment LUE Assessment: Within Functional Limits General Strength Comments: Generalized weakness RLE Assessment RLE Assessment: Exceptions to [facility] General Strength Comments: 1/5 in extension synerygy in supine - no change from eval LLE Assessment LLE Assessment: Exceptions to [facility] General Strength Comments: grossly 4/5 proximal to distal    Rosita DeChalus 09/26/2019, 7:50 AM

## 2019-09-26 NOTE — Progress Notes (Signed)
Patient ID: [name], female   DOB: April 14, 1932, 86 y.o.   MRN: [id number] patient vitals signs stable discharging with PTAR. Family present at discharge

## 2019-09-27 DIAGNOSIS — I63512 Cerebral infarction due to unspecified occlusion or stenosis of left middle cerebral artery: Secondary | ICD-10-CM | POA: Diagnosis not present

## 2019-09-27 DIAGNOSIS — F0150 Vascular dementia without behavioral disturbance: Secondary | ICD-10-CM | POA: Diagnosis not present

## 2019-09-27 DIAGNOSIS — I69391 Dysphagia following cerebral infarction: Secondary | ICD-10-CM | POA: Diagnosis not present

## 2019-09-27 DIAGNOSIS — G8191 Hemiplegia, unspecified affecting right dominant side: Secondary | ICD-10-CM | POA: Diagnosis not present

## 2019-09-28 DIAGNOSIS — I63512 Cerebral infarction due to unspecified occlusion or stenosis of left middle cerebral artery: Secondary | ICD-10-CM | POA: Diagnosis not present

## 2019-09-28 DIAGNOSIS — F0150 Vascular dementia without behavioral disturbance: Secondary | ICD-10-CM | POA: Diagnosis not present

## 2019-09-28 DIAGNOSIS — G8191 Hemiplegia, unspecified affecting right dominant side: Secondary | ICD-10-CM | POA: Diagnosis not present

## 2019-09-28 DIAGNOSIS — R1310 Dysphagia, unspecified: Secondary | ICD-10-CM | POA: Diagnosis not present

## 2019-10-02 DIAGNOSIS — I63512 Cerebral infarction due to unspecified occlusion or stenosis of left middle cerebral artery: Secondary | ICD-10-CM | POA: Diagnosis not present

## 2019-10-02 DIAGNOSIS — I10 Essential (primary) hypertension: Secondary | ICD-10-CM | POA: Diagnosis not present

## 2019-10-02 DIAGNOSIS — R339 Retention of urine, unspecified: Secondary | ICD-10-CM | POA: Diagnosis not present

## 2019-10-02 DIAGNOSIS — I69391 Dysphagia following cerebral infarction: Secondary | ICD-10-CM | POA: Diagnosis not present

## 2019-10-04 DIAGNOSIS — I69391 Dysphagia following cerebral infarction: Secondary | ICD-10-CM | POA: Diagnosis not present

## 2019-10-04 DIAGNOSIS — M5432 Sciatica, left side: Secondary | ICD-10-CM | POA: Diagnosis not present

## 2019-10-04 DIAGNOSIS — I63512 Cerebral infarction due to unspecified occlusion or stenosis of left middle cerebral artery: Secondary | ICD-10-CM | POA: Diagnosis not present

## 2019-10-04 DIAGNOSIS — R1084 Generalized abdominal pain: Secondary | ICD-10-CM | POA: Diagnosis not present

## 2019-10-04 DIAGNOSIS — I10 Essential (primary) hypertension: Secondary | ICD-10-CM | POA: Diagnosis not present

## 2019-10-04 DIAGNOSIS — R338 Other retention of urine: Secondary | ICD-10-CM | POA: Diagnosis not present

## 2019-10-11 DIAGNOSIS — R338 Other retention of urine: Secondary | ICD-10-CM | POA: Diagnosis not present

## 2019-10-11 DIAGNOSIS — G8191 Hemiplegia, unspecified affecting right dominant side: Secondary | ICD-10-CM | POA: Diagnosis not present

## 2019-10-11 DIAGNOSIS — K08409 Partial loss of teeth, unspecified cause, unspecified class: Secondary | ICD-10-CM | POA: Diagnosis not present

## 2019-10-11 DIAGNOSIS — I69391 Dysphagia following cerebral infarction: Secondary | ICD-10-CM | POA: Diagnosis not present

## 2019-10-16 DIAGNOSIS — I69391 Dysphagia following cerebral infarction: Secondary | ICD-10-CM | POA: Diagnosis not present

## 2019-10-16 DIAGNOSIS — G8191 Hemiplegia, unspecified affecting right dominant side: Secondary | ICD-10-CM | POA: Diagnosis not present

## 2019-10-16 DIAGNOSIS — I63512 Cerebral infarction due to unspecified occlusion or stenosis of left middle cerebral artery: Secondary | ICD-10-CM | POA: Diagnosis not present

## 2019-10-16 DIAGNOSIS — R339 Retention of urine, unspecified: Secondary | ICD-10-CM | POA: Diagnosis not present

## 2019-10-19 ENCOUNTER — Telehealth: Payer: Self-pay | Admitting: *Deleted

## 2019-10-19 DIAGNOSIS — G4709 Other insomnia: Secondary | ICD-10-CM | POA: Diagnosis not present

## 2019-10-19 DIAGNOSIS — R634 Abnormal weight loss: Secondary | ICD-10-CM | POA: Diagnosis not present

## 2019-10-19 DIAGNOSIS — I63512 Cerebral infarction due to unspecified occlusion or stenosis of left middle cerebral artery: Secondary | ICD-10-CM | POA: Diagnosis not present

## 2019-10-19 DIAGNOSIS — I69391 Dysphagia following cerebral infarction: Secondary | ICD-10-CM | POA: Diagnosis not present

## 2019-10-19 DIAGNOSIS — K5909 Other constipation: Secondary | ICD-10-CM | POA: Diagnosis not present

## 2019-10-19 DIAGNOSIS — R338 Other retention of urine: Secondary | ICD-10-CM | POA: Diagnosis not present

## 2019-10-19 NOTE — Telephone Encounter (Signed)
May cancel my appt.  Have pt's PCP make a neuro referral in [place] since I am not familiar with that area

## 2019-10-19 NOTE — Telephone Encounter (Signed)
[name]'s daughter called and she has moved her mom to a SNF in [facility] and she is asking for a referral to a neurologist in [place] because it will be too hard to bring her back to [place].  She was scheduled to come in for HFU on 02/02/19 with Dr Letta Pate. Please advise

## 2019-10-20 NOTE — Telephone Encounter (Signed)
I have notified her daughter.  They may end up moving her back to [place] in the future and will contact our office for an appt at that time.

## 2019-10-25 DIAGNOSIS — G8191 Hemiplegia, unspecified affecting right dominant side: Secondary | ICD-10-CM | POA: Diagnosis not present

## 2019-10-25 DIAGNOSIS — I69391 Dysphagia following cerebral infarction: Secondary | ICD-10-CM | POA: Diagnosis not present

## 2019-10-25 DIAGNOSIS — R339 Retention of urine, unspecified: Secondary | ICD-10-CM | POA: Diagnosis not present

## 2019-10-25 DIAGNOSIS — F5104 Psychophysiologic insomnia: Secondary | ICD-10-CM | POA: Diagnosis not present

## 2019-10-26 DIAGNOSIS — R7989 Other specified abnormal findings of blood chemistry: Secondary | ICD-10-CM | POA: Diagnosis not present

## 2019-10-26 DIAGNOSIS — G478 Other sleep disorders: Secondary | ICD-10-CM | POA: Diagnosis not present

## 2019-10-26 DIAGNOSIS — E876 Hypokalemia: Secondary | ICD-10-CM | POA: Diagnosis not present

## 2019-10-26 DIAGNOSIS — R1319 Other dysphagia: Secondary | ICD-10-CM | POA: Diagnosis not present

## 2019-10-26 DIAGNOSIS — R7309 Other abnormal glucose: Secondary | ICD-10-CM | POA: Diagnosis not present

## 2019-10-26 DIAGNOSIS — I10 Essential (primary) hypertension: Secondary | ICD-10-CM | POA: Diagnosis not present

## 2019-11-01 DIAGNOSIS — F5104 Psychophysiologic insomnia: Secondary | ICD-10-CM | POA: Diagnosis not present

## 2019-11-01 DIAGNOSIS — R339 Retention of urine, unspecified: Secondary | ICD-10-CM | POA: Diagnosis not present

## 2019-11-01 DIAGNOSIS — I6990 Unspecified sequelae of unspecified cerebrovascular disease: Secondary | ICD-10-CM | POA: Diagnosis not present

## 2019-11-01 DIAGNOSIS — I69391 Dysphagia following cerebral infarction: Secondary | ICD-10-CM | POA: Diagnosis not present

## 2019-11-02 ENCOUNTER — Inpatient Hospital Stay: Payer: Medicare Other | Admitting: Physical Medicine & Rehabilitation

## 2019-11-02 DIAGNOSIS — I63512 Cerebral infarction due to unspecified occlusion or stenosis of left middle cerebral artery: Secondary | ICD-10-CM | POA: Diagnosis not present

## 2019-11-02 DIAGNOSIS — I10 Essential (primary) hypertension: Secondary | ICD-10-CM | POA: Diagnosis not present

## 2019-11-02 DIAGNOSIS — R339 Retention of urine, unspecified: Secondary | ICD-10-CM | POA: Diagnosis not present

## 2019-11-02 DIAGNOSIS — K219 Gastro-esophageal reflux disease without esophagitis: Secondary | ICD-10-CM | POA: Diagnosis not present

## 2019-11-09 ENCOUNTER — Ambulatory Visit: Payer: Medicare Other | Admitting: Neurology

## 2019-11-20 DIAGNOSIS — G3109 Other frontotemporal dementia: Secondary | ICD-10-CM | POA: Diagnosis not present

## 2019-11-20 DIAGNOSIS — G47429 Narcolepsy in conditions classified elsewhere without cataplexy: Secondary | ICD-10-CM | POA: Diagnosis not present

## 2019-11-20 DIAGNOSIS — R338 Other retention of urine: Secondary | ICD-10-CM | POA: Diagnosis not present

## 2019-11-20 DIAGNOSIS — F5101 Primary insomnia: Secondary | ICD-10-CM | POA: Diagnosis not present

## 2019-11-20 DIAGNOSIS — I10 Essential (primary) hypertension: Secondary | ICD-10-CM | POA: Diagnosis not present

## 2019-11-20 DIAGNOSIS — Z789 Other specified health status: Secondary | ICD-10-CM | POA: Diagnosis not present

## 2019-11-20 DIAGNOSIS — F331 Major depressive disorder, recurrent, moderate: Secondary | ICD-10-CM | POA: Diagnosis not present

## 2019-11-22 DIAGNOSIS — I63512 Cerebral infarction due to unspecified occlusion or stenosis of left middle cerebral artery: Secondary | ICD-10-CM | POA: Diagnosis not present

## 2019-11-22 DIAGNOSIS — E871 Hypo-osmolality and hyponatremia: Secondary | ICD-10-CM | POA: Diagnosis not present

## 2019-11-22 DIAGNOSIS — R0989 Other specified symptoms and signs involving the circulatory and respiratory systems: Secondary | ICD-10-CM | POA: Diagnosis not present

## 2019-11-22 DIAGNOSIS — J811 Chronic pulmonary edema: Secondary | ICD-10-CM | POA: Diagnosis not present

## 2019-11-22 DIAGNOSIS — I2699 Other pulmonary embolism without acute cor pulmonale: Secondary | ICD-10-CM | POA: Diagnosis not present

## 2019-11-22 DIAGNOSIS — K5790 Diverticulosis of intestine, part unspecified, without perforation or abscess without bleeding: Secondary | ICD-10-CM | POA: Diagnosis not present

## 2019-11-22 DIAGNOSIS — Z961 Presence of intraocular lens: Secondary | ICD-10-CM | POA: Diagnosis not present

## 2019-11-22 DIAGNOSIS — R1031 Right lower quadrant pain: Secondary | ICD-10-CM | POA: Diagnosis not present

## 2019-11-22 DIAGNOSIS — K5731 Diverticulosis of large intestine without perforation or abscess with bleeding: Secondary | ICD-10-CM | POA: Diagnosis not present

## 2019-11-22 DIAGNOSIS — K5909 Other constipation: Secondary | ICD-10-CM | POA: Diagnosis not present

## 2019-11-22 DIAGNOSIS — K921 Melena: Secondary | ICD-10-CM | POA: Diagnosis not present

## 2019-11-22 DIAGNOSIS — K5900 Constipation, unspecified: Secondary | ICD-10-CM | POA: Diagnosis not present

## 2019-11-22 DIAGNOSIS — R1030 Lower abdominal pain, unspecified: Secondary | ICD-10-CM | POA: Diagnosis not present

## 2019-11-22 DIAGNOSIS — I69951 Hemiplegia and hemiparesis following unspecified cerebrovascular disease affecting right dominant side: Secondary | ICD-10-CM | POA: Diagnosis not present

## 2019-11-22 DIAGNOSIS — R338 Other retention of urine: Secondary | ICD-10-CM | POA: Diagnosis not present

## 2019-11-22 DIAGNOSIS — D72829 Elevated white blood cell count, unspecified: Secondary | ICD-10-CM | POA: Diagnosis not present

## 2019-11-22 DIAGNOSIS — R1084 Generalized abdominal pain: Secondary | ICD-10-CM | POA: Diagnosis not present

## 2019-11-22 DIAGNOSIS — N1330 Unspecified hydronephrosis: Secondary | ICD-10-CM | POA: Diagnosis not present

## 2019-11-22 DIAGNOSIS — K5641 Fecal impaction: Secondary | ICD-10-CM | POA: Diagnosis not present

## 2019-11-22 DIAGNOSIS — N320 Bladder-neck obstruction: Secondary | ICD-10-CM | POA: Diagnosis not present

## 2019-11-22 DIAGNOSIS — K9289 Other specified diseases of the digestive system: Secondary | ICD-10-CM | POA: Diagnosis not present

## 2019-11-22 DIAGNOSIS — I10 Essential (primary) hypertension: Secondary | ICD-10-CM | POA: Diagnosis not present

## 2019-11-22 DIAGNOSIS — K625 Hemorrhage of anus and rectum: Secondary | ICD-10-CM | POA: Diagnosis not present

## 2019-11-22 DIAGNOSIS — H401134 Primary open-angle glaucoma, bilateral, indeterminate stage: Secondary | ICD-10-CM | POA: Diagnosis not present

## 2019-11-22 DIAGNOSIS — H524 Presbyopia: Secondary | ICD-10-CM | POA: Diagnosis not present

## 2019-11-23 DIAGNOSIS — Z20822 Contact with and (suspected) exposure to covid-19: Secondary | ICD-10-CM | POA: Diagnosis not present

## 2019-11-23 DIAGNOSIS — I8001 Phlebitis and thrombophlebitis of superficial vessels of right lower extremity: Secondary | ICD-10-CM | POA: Diagnosis not present

## 2019-11-23 DIAGNOSIS — H409 Unspecified glaucoma: Secondary | ICD-10-CM | POA: Diagnosis not present

## 2019-11-23 DIAGNOSIS — R531 Weakness: Secondary | ICD-10-CM | POA: Diagnosis not present

## 2019-11-23 DIAGNOSIS — F0150 Vascular dementia without behavioral disturbance: Secondary | ICD-10-CM | POA: Diagnosis not present

## 2019-11-23 DIAGNOSIS — R059 Cough, unspecified: Secondary | ICD-10-CM | POA: Diagnosis not present

## 2019-11-23 DIAGNOSIS — R279 Unspecified lack of coordination: Secondary | ICD-10-CM | POA: Diagnosis not present

## 2019-11-23 DIAGNOSIS — Z90710 Acquired absence of both cervix and uterus: Secondary | ICD-10-CM | POA: Diagnosis not present

## 2019-11-23 DIAGNOSIS — R339 Retention of urine, unspecified: Secondary | ICD-10-CM | POA: Diagnosis not present

## 2019-11-23 DIAGNOSIS — K625 Hemorrhage of anus and rectum: Secondary | ICD-10-CM | POA: Diagnosis not present

## 2019-11-23 DIAGNOSIS — I69391 Dysphagia following cerebral infarction: Secondary | ICD-10-CM | POA: Diagnosis not present

## 2019-11-23 DIAGNOSIS — K5900 Constipation, unspecified: Secondary | ICD-10-CM | POA: Diagnosis present

## 2019-11-23 DIAGNOSIS — Z885 Allergy status to narcotic agent status: Secondary | ICD-10-CM | POA: Diagnosis not present

## 2019-11-23 DIAGNOSIS — R4189 Other symptoms and signs involving cognitive functions and awareness: Secondary | ICD-10-CM | POA: Diagnosis present

## 2019-11-23 DIAGNOSIS — Z8719 Personal history of other diseases of the digestive system: Secondary | ICD-10-CM | POA: Diagnosis not present

## 2019-11-23 DIAGNOSIS — N3000 Acute cystitis without hematuria: Secondary | ICD-10-CM | POA: Diagnosis not present

## 2019-11-23 DIAGNOSIS — Z466 Encounter for fitting and adjustment of urinary device: Secondary | ICD-10-CM | POA: Diagnosis not present

## 2019-11-23 DIAGNOSIS — E538 Deficiency of other specified B group vitamins: Secondary | ICD-10-CM | POA: Diagnosis not present

## 2019-11-23 DIAGNOSIS — R404 Transient alteration of awareness: Secondary | ICD-10-CM | POA: Diagnosis not present

## 2019-11-23 DIAGNOSIS — R488 Other symbolic dysfunctions: Secondary | ICD-10-CM | POA: Diagnosis not present

## 2019-11-23 DIAGNOSIS — I69351 Hemiplegia and hemiparesis following cerebral infarction affecting right dominant side: Secondary | ICD-10-CM | POA: Diagnosis not present

## 2019-11-23 DIAGNOSIS — N320 Bladder-neck obstruction: Secondary | ICD-10-CM | POA: Diagnosis present

## 2019-11-23 DIAGNOSIS — E785 Hyperlipidemia, unspecified: Secondary | ICD-10-CM | POA: Diagnosis not present

## 2019-11-23 DIAGNOSIS — K5730 Diverticulosis of large intestine without perforation or abscess without bleeding: Secondary | ICD-10-CM | POA: Diagnosis not present

## 2019-11-23 DIAGNOSIS — M6281 Muscle weakness (generalized): Secondary | ICD-10-CM | POA: Diagnosis not present

## 2019-11-23 DIAGNOSIS — K529 Noninfective gastroenteritis and colitis, unspecified: Secondary | ICD-10-CM | POA: Diagnosis not present

## 2019-11-23 DIAGNOSIS — G4700 Insomnia, unspecified: Secondary | ICD-10-CM | POA: Diagnosis not present

## 2019-11-23 DIAGNOSIS — I4891 Unspecified atrial fibrillation: Secondary | ICD-10-CM | POA: Diagnosis present

## 2019-11-23 DIAGNOSIS — Z86718 Personal history of other venous thrombosis and embolism: Secondary | ICD-10-CM | POA: Diagnosis not present

## 2019-11-23 DIAGNOSIS — G459 Transient cerebral ischemic attack, unspecified: Secondary | ICD-10-CM | POA: Diagnosis not present

## 2019-11-23 DIAGNOSIS — Z7902 Long term (current) use of antithrombotics/antiplatelets: Secondary | ICD-10-CM | POA: Diagnosis not present

## 2019-11-23 DIAGNOSIS — M810 Age-related osteoporosis without current pathological fracture: Secondary | ICD-10-CM | POA: Diagnosis present

## 2019-11-23 DIAGNOSIS — R278 Other lack of coordination: Secondary | ICD-10-CM | POA: Diagnosis not present

## 2019-11-23 DIAGNOSIS — Z9049 Acquired absence of other specified parts of digestive tract: Secondary | ICD-10-CM | POA: Diagnosis not present

## 2019-11-23 DIAGNOSIS — K5641 Fecal impaction: Secondary | ICD-10-CM | POA: Diagnosis not present

## 2019-11-23 DIAGNOSIS — K5289 Other specified noninfective gastroenteritis and colitis: Secondary | ICD-10-CM | POA: Diagnosis not present

## 2019-11-23 DIAGNOSIS — Z7982 Long term (current) use of aspirin: Secondary | ICD-10-CM | POA: Diagnosis not present

## 2019-11-23 DIAGNOSIS — R2689 Other abnormalities of gait and mobility: Secondary | ICD-10-CM | POA: Diagnosis not present

## 2019-11-23 DIAGNOSIS — Z743 Need for continuous supervision: Secondary | ICD-10-CM | POA: Diagnosis not present

## 2019-11-23 DIAGNOSIS — I10 Essential (primary) hypertension: Secondary | ICD-10-CM | POA: Diagnosis present

## 2019-11-23 DIAGNOSIS — I69328 Other speech and language deficits following cerebral infarction: Secondary | ICD-10-CM | POA: Diagnosis not present

## 2019-11-23 DIAGNOSIS — I639 Cerebral infarction, unspecified: Secondary | ICD-10-CM | POA: Diagnosis not present

## 2019-11-23 DIAGNOSIS — E876 Hypokalemia: Secondary | ICD-10-CM | POA: Diagnosis present

## 2019-11-23 DIAGNOSIS — K921 Melena: Secondary | ICD-10-CM | POA: Diagnosis present

## 2019-11-23 DIAGNOSIS — I69891 Dysphagia following other cerebrovascular disease: Secondary | ICD-10-CM | POA: Diagnosis not present

## 2019-11-23 DIAGNOSIS — K5731 Diverticulosis of large intestine without perforation or abscess with bleeding: Secondary | ICD-10-CM | POA: Diagnosis present

## 2019-11-23 DIAGNOSIS — Z4682 Encounter for fitting and adjustment of non-vascular catheter: Secondary | ICD-10-CM | POA: Diagnosis not present

## 2019-11-23 DIAGNOSIS — K219 Gastro-esophageal reflux disease without esophagitis: Secondary | ICD-10-CM | POA: Diagnosis not present

## 2019-11-23 DIAGNOSIS — K6289 Other specified diseases of anus and rectum: Secondary | ICD-10-CM | POA: Diagnosis not present

## 2019-11-23 DIAGNOSIS — Z7901 Long term (current) use of anticoagulants: Secondary | ICD-10-CM | POA: Diagnosis not present

## 2019-11-23 DIAGNOSIS — I2693 Single subsegmental pulmonary embolism without acute cor pulmonale: Secondary | ICD-10-CM | POA: Diagnosis not present

## 2019-11-23 DIAGNOSIS — D72829 Elevated white blood cell count, unspecified: Secondary | ICD-10-CM | POA: Diagnosis not present

## 2019-11-23 DIAGNOSIS — Z9181 History of falling: Secondary | ICD-10-CM | POA: Diagnosis not present

## 2019-11-23 DIAGNOSIS — F32A Depression, unspecified: Secondary | ICD-10-CM | POA: Diagnosis not present

## 2019-11-23 DIAGNOSIS — I69951 Hemiplegia and hemiparesis following unspecified cerebrovascular disease affecting right dominant side: Secondary | ICD-10-CM | POA: Diagnosis not present

## 2019-11-23 DIAGNOSIS — J9811 Atelectasis: Secondary | ICD-10-CM | POA: Diagnosis not present

## 2019-11-23 DIAGNOSIS — J811 Chronic pulmonary edema: Secondary | ICD-10-CM | POA: Diagnosis not present

## 2019-11-23 DIAGNOSIS — D62 Acute posthemorrhagic anemia: Secondary | ICD-10-CM | POA: Diagnosis not present

## 2019-11-23 DIAGNOSIS — Z431 Encounter for attention to gastrostomy: Secondary | ICD-10-CM | POA: Diagnosis not present

## 2019-11-23 DIAGNOSIS — I2510 Atherosclerotic heart disease of native coronary artery without angina pectoris: Secondary | ICD-10-CM | POA: Diagnosis present

## 2019-11-23 DIAGNOSIS — K3189 Other diseases of stomach and duodenum: Secondary | ICD-10-CM | POA: Diagnosis not present

## 2019-11-23 DIAGNOSIS — I2699 Other pulmonary embolism without acute cor pulmonale: Secondary | ICD-10-CM | POA: Diagnosis present

## 2019-11-23 DIAGNOSIS — B961 Klebsiella pneumoniae [K. pneumoniae] as the cause of diseases classified elsewhere: Secondary | ICD-10-CM | POA: Diagnosis not present

## 2019-11-23 DIAGNOSIS — J90 Pleural effusion, not elsewhere classified: Secondary | ICD-10-CM | POA: Diagnosis not present

## 2019-11-23 DIAGNOSIS — R0989 Other specified symptoms and signs involving the circulatory and respiratory systems: Secondary | ICD-10-CM | POA: Diagnosis not present

## 2019-11-23 DIAGNOSIS — N390 Urinary tract infection, site not specified: Secondary | ICD-10-CM | POA: Diagnosis not present

## 2019-11-23 DIAGNOSIS — E871 Hypo-osmolality and hyponatremia: Secondary | ICD-10-CM | POA: Diagnosis present

## 2019-11-23 DIAGNOSIS — Z86711 Personal history of pulmonary embolism: Secondary | ICD-10-CM | POA: Diagnosis not present

## 2019-11-23 DIAGNOSIS — R1312 Dysphagia, oropharyngeal phase: Secondary | ICD-10-CM | POA: Diagnosis not present

## 2019-11-23 DIAGNOSIS — K922 Gastrointestinal hemorrhage, unspecified: Secondary | ICD-10-CM | POA: Diagnosis not present

## 2019-11-24 DIAGNOSIS — K5641 Fecal impaction: Secondary | ICD-10-CM | POA: Diagnosis not present

## 2019-11-24 DIAGNOSIS — I2510 Atherosclerotic heart disease of native coronary artery without angina pectoris: Secondary | ICD-10-CM | POA: Diagnosis not present

## 2019-11-24 DIAGNOSIS — K5900 Constipation, unspecified: Secondary | ICD-10-CM | POA: Diagnosis not present

## 2019-11-24 DIAGNOSIS — N320 Bladder-neck obstruction: Secondary | ICD-10-CM | POA: Diagnosis not present

## 2019-11-24 DIAGNOSIS — I2693 Single subsegmental pulmonary embolism without acute cor pulmonale: Secondary | ICD-10-CM | POA: Diagnosis not present

## 2019-11-24 DIAGNOSIS — Z4682 Encounter for fitting and adjustment of non-vascular catheter: Secondary | ICD-10-CM | POA: Diagnosis not present

## 2019-11-25 DIAGNOSIS — N320 Bladder-neck obstruction: Secondary | ICD-10-CM | POA: Diagnosis not present

## 2019-11-26 DIAGNOSIS — N320 Bladder-neck obstruction: Secondary | ICD-10-CM | POA: Diagnosis not present

## 2019-11-27 DIAGNOSIS — N320 Bladder-neck obstruction: Secondary | ICD-10-CM | POA: Diagnosis not present

## 2019-11-28 DIAGNOSIS — N320 Bladder-neck obstruction: Secondary | ICD-10-CM | POA: Diagnosis not present

## 2019-11-28 DIAGNOSIS — K625 Hemorrhage of anus and rectum: Secondary | ICD-10-CM | POA: Diagnosis not present

## 2019-11-28 DIAGNOSIS — K5641 Fecal impaction: Secondary | ICD-10-CM | POA: Diagnosis not present

## 2019-11-29 DIAGNOSIS — K5641 Fecal impaction: Secondary | ICD-10-CM | POA: Diagnosis not present

## 2019-11-29 DIAGNOSIS — K625 Hemorrhage of anus and rectum: Secondary | ICD-10-CM | POA: Diagnosis not present

## 2019-11-29 DIAGNOSIS — N320 Bladder-neck obstruction: Secondary | ICD-10-CM | POA: Diagnosis not present

## 2019-11-30 DIAGNOSIS — N320 Bladder-neck obstruction: Secondary | ICD-10-CM | POA: Diagnosis not present

## 2019-12-01 ENCOUNTER — Other Ambulatory Visit: Payer: Self-pay

## 2019-12-01 DIAGNOSIS — N320 Bladder-neck obstruction: Secondary | ICD-10-CM | POA: Diagnosis not present

## 2019-12-01 NOTE — Patient Outreach (Signed)
[facility]) Care Management  12/01/2019  [name] [birth year] [id number]   Telephone outreach to patient to obtain mRS was successfully completed. MRS=5.  Philmore Pali under supervision of Center Management Assistant [phone]

## 2019-12-01 NOTE — Patient Outreach (Signed)
[name] Care Management  12/01/2019  [name] 20-May-1932 [id number]   First telephone outreach attempt to obtain mRS. No answer. Left message for returned call.  Philmore Pali under supervision of [facility] Management Assistant [phone]

## 2019-12-02 DIAGNOSIS — N320 Bladder-neck obstruction: Secondary | ICD-10-CM | POA: Diagnosis not present

## 2019-12-03 DIAGNOSIS — N320 Bladder-neck obstruction: Secondary | ICD-10-CM | POA: Diagnosis not present

## 2019-12-03 DIAGNOSIS — K5641 Fecal impaction: Secondary | ICD-10-CM | POA: Diagnosis not present

## 2019-12-03 DIAGNOSIS — K921 Melena: Secondary | ICD-10-CM | POA: Diagnosis not present

## 2019-12-04 DIAGNOSIS — N320 Bladder-neck obstruction: Secondary | ICD-10-CM | POA: Diagnosis not present

## 2019-12-08 DIAGNOSIS — H409 Unspecified glaucoma: Secondary | ICD-10-CM | POA: Diagnosis not present

## 2019-12-08 DIAGNOSIS — N3080 Other cystitis without hematuria: Secondary | ICD-10-CM | POA: Diagnosis not present

## 2019-12-08 DIAGNOSIS — R278 Other lack of coordination: Secondary | ICD-10-CM | POA: Diagnosis not present

## 2019-12-08 DIAGNOSIS — Z7902 Long term (current) use of antithrombotics/antiplatelets: Secondary | ICD-10-CM | POA: Diagnosis not present

## 2019-12-08 DIAGNOSIS — I4891 Unspecified atrial fibrillation: Secondary | ICD-10-CM | POA: Diagnosis not present

## 2019-12-08 DIAGNOSIS — K625 Hemorrhage of anus and rectum: Secondary | ICD-10-CM | POA: Diagnosis not present

## 2019-12-08 DIAGNOSIS — G3109 Other frontotemporal dementia: Secondary | ICD-10-CM | POA: Diagnosis not present

## 2019-12-08 DIAGNOSIS — Z9181 History of falling: Secondary | ICD-10-CM | POA: Diagnosis not present

## 2019-12-08 DIAGNOSIS — I2699 Other pulmonary embolism without acute cor pulmonale: Secondary | ICD-10-CM | POA: Diagnosis not present

## 2019-12-08 DIAGNOSIS — I69918 Other symptoms and signs involving cognitive functions following unspecified cerebrovascular disease: Secondary | ICD-10-CM | POA: Diagnosis not present

## 2019-12-08 DIAGNOSIS — R404 Transient alteration of awareness: Secondary | ICD-10-CM | POA: Diagnosis not present

## 2019-12-08 DIAGNOSIS — N390 Urinary tract infection, site not specified: Secondary | ICD-10-CM | POA: Diagnosis not present

## 2019-12-08 DIAGNOSIS — I69328 Other speech and language deficits following cerebral infarction: Secondary | ICD-10-CM | POA: Diagnosis not present

## 2019-12-08 DIAGNOSIS — K5289 Other specified noninfective gastroenteritis and colitis: Secondary | ICD-10-CM | POA: Diagnosis not present

## 2019-12-08 DIAGNOSIS — R198 Other specified symptoms and signs involving the digestive system and abdomen: Secondary | ICD-10-CM | POA: Diagnosis not present

## 2019-12-08 DIAGNOSIS — E876 Hypokalemia: Secondary | ICD-10-CM | POA: Diagnosis not present

## 2019-12-08 DIAGNOSIS — R488 Other symbolic dysfunctions: Secondary | ICD-10-CM | POA: Diagnosis not present

## 2019-12-08 DIAGNOSIS — Z466 Encounter for fitting and adjustment of urinary device: Secondary | ICD-10-CM | POA: Diagnosis not present

## 2019-12-08 DIAGNOSIS — Z7982 Long term (current) use of aspirin: Secondary | ICD-10-CM | POA: Diagnosis not present

## 2019-12-08 DIAGNOSIS — I2510 Atherosclerotic heart disease of native coronary artery without angina pectoris: Secondary | ICD-10-CM | POA: Diagnosis not present

## 2019-12-08 DIAGNOSIS — F331 Major depressive disorder, recurrent, moderate: Secondary | ICD-10-CM | POA: Diagnosis not present

## 2019-12-08 DIAGNOSIS — M2042 Other hammer toe(s) (acquired), left foot: Secondary | ICD-10-CM | POA: Diagnosis not present

## 2019-12-08 DIAGNOSIS — Z431 Encounter for attention to gastrostomy: Secondary | ICD-10-CM | POA: Diagnosis not present

## 2019-12-08 DIAGNOSIS — R279 Unspecified lack of coordination: Secondary | ICD-10-CM | POA: Diagnosis not present

## 2019-12-08 DIAGNOSIS — G8191 Hemiplegia, unspecified affecting right dominant side: Secondary | ICD-10-CM | POA: Diagnosis not present

## 2019-12-08 DIAGNOSIS — D62 Acute posthemorrhagic anemia: Secondary | ICD-10-CM | POA: Diagnosis not present

## 2019-12-08 DIAGNOSIS — F0150 Vascular dementia without behavioral disturbance: Secondary | ICD-10-CM | POA: Diagnosis not present

## 2019-12-08 DIAGNOSIS — E785 Hyperlipidemia, unspecified: Secondary | ICD-10-CM | POA: Diagnosis not present

## 2019-12-08 DIAGNOSIS — R2689 Other abnormalities of gait and mobility: Secondary | ICD-10-CM | POA: Diagnosis not present

## 2019-12-08 DIAGNOSIS — Z743 Need for continuous supervision: Secondary | ICD-10-CM | POA: Diagnosis not present

## 2019-12-08 DIAGNOSIS — L603 Nail dystrophy: Secondary | ICD-10-CM | POA: Diagnosis not present

## 2019-12-08 DIAGNOSIS — R634 Abnormal weight loss: Secondary | ICD-10-CM | POA: Diagnosis not present

## 2019-12-08 DIAGNOSIS — R531 Weakness: Secondary | ICD-10-CM | POA: Diagnosis not present

## 2019-12-08 DIAGNOSIS — F5101 Primary insomnia: Secondary | ICD-10-CM | POA: Diagnosis not present

## 2019-12-08 DIAGNOSIS — D500 Iron deficiency anemia secondary to blood loss (chronic): Secondary | ICD-10-CM | POA: Diagnosis not present

## 2019-12-08 DIAGNOSIS — K219 Gastro-esophageal reflux disease without esophagitis: Secondary | ICD-10-CM | POA: Diagnosis not present

## 2019-12-08 DIAGNOSIS — Z789 Other specified health status: Secondary | ICD-10-CM | POA: Diagnosis not present

## 2019-12-08 DIAGNOSIS — L602 Onychogryphosis: Secondary | ICD-10-CM | POA: Diagnosis not present

## 2019-12-08 DIAGNOSIS — G47429 Narcolepsy in conditions classified elsewhere without cataplexy: Secondary | ICD-10-CM | POA: Diagnosis not present

## 2019-12-08 DIAGNOSIS — G4700 Insomnia, unspecified: Secondary | ICD-10-CM | POA: Diagnosis not present

## 2019-12-08 DIAGNOSIS — R339 Retention of urine, unspecified: Secondary | ICD-10-CM | POA: Diagnosis not present

## 2019-12-08 DIAGNOSIS — F3289 Other specified depressive episodes: Secondary | ICD-10-CM | POA: Diagnosis not present

## 2019-12-08 DIAGNOSIS — G459 Transient cerebral ischemic attack, unspecified: Secondary | ICD-10-CM | POA: Diagnosis not present

## 2019-12-08 DIAGNOSIS — K5909 Other constipation: Secondary | ICD-10-CM | POA: Diagnosis not present

## 2019-12-08 DIAGNOSIS — R338 Other retention of urine: Secondary | ICD-10-CM | POA: Diagnosis not present

## 2019-12-08 DIAGNOSIS — I739 Peripheral vascular disease, unspecified: Secondary | ICD-10-CM | POA: Diagnosis not present

## 2019-12-08 DIAGNOSIS — I69391 Dysphagia following cerebral infarction: Secondary | ICD-10-CM | POA: Diagnosis not present

## 2019-12-08 DIAGNOSIS — E538 Deficiency of other specified B group vitamins: Secondary | ICD-10-CM | POA: Diagnosis not present

## 2019-12-08 DIAGNOSIS — R1312 Dysphagia, oropharyngeal phase: Secondary | ICD-10-CM | POA: Diagnosis not present

## 2019-12-08 DIAGNOSIS — I10 Essential (primary) hypertension: Secondary | ICD-10-CM | POA: Diagnosis not present

## 2019-12-08 DIAGNOSIS — B961 Klebsiella pneumoniae [K. pneumoniae] as the cause of diseases classified elsewhere: Secondary | ICD-10-CM | POA: Diagnosis not present

## 2019-12-08 DIAGNOSIS — F32A Depression, unspecified: Secondary | ICD-10-CM | POA: Diagnosis not present

## 2019-12-08 DIAGNOSIS — M6281 Muscle weakness (generalized): Secondary | ICD-10-CM | POA: Diagnosis not present

## 2019-12-11 DIAGNOSIS — R339 Retention of urine, unspecified: Secondary | ICD-10-CM | POA: Diagnosis not present

## 2019-12-11 DIAGNOSIS — I2699 Other pulmonary embolism without acute cor pulmonale: Secondary | ICD-10-CM | POA: Diagnosis not present

## 2019-12-11 DIAGNOSIS — D500 Iron deficiency anemia secondary to blood loss (chronic): Secondary | ICD-10-CM | POA: Diagnosis not present

## 2019-12-11 DIAGNOSIS — K5909 Other constipation: Secondary | ICD-10-CM | POA: Diagnosis not present

## 2019-12-18 DIAGNOSIS — K5909 Other constipation: Secondary | ICD-10-CM | POA: Diagnosis not present

## 2019-12-18 DIAGNOSIS — R338 Other retention of urine: Secondary | ICD-10-CM | POA: Diagnosis not present

## 2019-12-18 DIAGNOSIS — Z789 Other specified health status: Secondary | ICD-10-CM | POA: Diagnosis not present

## 2019-12-18 DIAGNOSIS — D500 Iron deficiency anemia secondary to blood loss (chronic): Secondary | ICD-10-CM | POA: Diagnosis not present

## 2019-12-21 DIAGNOSIS — F331 Major depressive disorder, recurrent, moderate: Secondary | ICD-10-CM | POA: Diagnosis not present

## 2019-12-25 DIAGNOSIS — G47429 Narcolepsy in conditions classified elsewhere without cataplexy: Secondary | ICD-10-CM | POA: Diagnosis not present

## 2019-12-25 DIAGNOSIS — F331 Major depressive disorder, recurrent, moderate: Secondary | ICD-10-CM | POA: Diagnosis not present

## 2019-12-25 DIAGNOSIS — F5101 Primary insomnia: Secondary | ICD-10-CM | POA: Diagnosis not present

## 2019-12-25 DIAGNOSIS — G3109 Other frontotemporal dementia: Secondary | ICD-10-CM | POA: Diagnosis not present

## 2019-12-26 DIAGNOSIS — L603 Nail dystrophy: Secondary | ICD-10-CM | POA: Diagnosis not present

## 2019-12-26 DIAGNOSIS — M2042 Other hammer toe(s) (acquired), left foot: Secondary | ICD-10-CM | POA: Diagnosis not present

## 2019-12-26 DIAGNOSIS — L602 Onychogryphosis: Secondary | ICD-10-CM | POA: Diagnosis not present

## 2019-12-26 DIAGNOSIS — I739 Peripheral vascular disease, unspecified: Secondary | ICD-10-CM | POA: Diagnosis not present

## 2019-12-27 DIAGNOSIS — R338 Other retention of urine: Secondary | ICD-10-CM | POA: Diagnosis not present

## 2019-12-27 DIAGNOSIS — N3080 Other cystitis without hematuria: Secondary | ICD-10-CM | POA: Diagnosis not present

## 2019-12-27 DIAGNOSIS — I69918 Other symptoms and signs involving cognitive functions following unspecified cerebrovascular disease: Secondary | ICD-10-CM | POA: Diagnosis not present

## 2019-12-27 DIAGNOSIS — K5289 Other specified noninfective gastroenteritis and colitis: Secondary | ICD-10-CM | POA: Diagnosis not present

## 2019-12-27 DIAGNOSIS — R198 Other specified symptoms and signs involving the digestive system and abdomen: Secondary | ICD-10-CM | POA: Diagnosis not present

## 2019-12-28 DIAGNOSIS — F331 Major depressive disorder, recurrent, moderate: Secondary | ICD-10-CM | POA: Diagnosis not present

## 2020-01-03 DIAGNOSIS — F3289 Other specified depressive episodes: Secondary | ICD-10-CM | POA: Diagnosis not present

## 2020-01-03 DIAGNOSIS — R338 Other retention of urine: Secondary | ICD-10-CM | POA: Diagnosis not present

## 2020-01-03 DIAGNOSIS — R198 Other specified symptoms and signs involving the digestive system and abdomen: Secondary | ICD-10-CM | POA: Diagnosis not present

## 2020-01-17 DIAGNOSIS — F5101 Primary insomnia: Secondary | ICD-10-CM | POA: Diagnosis not present

## 2020-01-17 DIAGNOSIS — K219 Gastro-esophageal reflux disease without esophagitis: Secondary | ICD-10-CM | POA: Diagnosis not present

## 2020-01-17 DIAGNOSIS — R634 Abnormal weight loss: Secondary | ICD-10-CM | POA: Diagnosis not present

## 2020-01-17 DIAGNOSIS — F331 Major depressive disorder, recurrent, moderate: Secondary | ICD-10-CM | POA: Diagnosis not present

## 2020-01-17 DIAGNOSIS — F32A Depression, unspecified: Secondary | ICD-10-CM | POA: Diagnosis not present

## 2020-01-17 DIAGNOSIS — K5289 Other specified noninfective gastroenteritis and colitis: Secondary | ICD-10-CM | POA: Diagnosis not present

## 2020-01-17 DIAGNOSIS — G47429 Narcolepsy in conditions classified elsewhere without cataplexy: Secondary | ICD-10-CM | POA: Diagnosis not present

## 2020-01-17 DIAGNOSIS — I10 Essential (primary) hypertension: Secondary | ICD-10-CM | POA: Diagnosis not present

## 2020-01-17 DIAGNOSIS — R338 Other retention of urine: Secondary | ICD-10-CM | POA: Diagnosis not present

## 2020-01-17 DIAGNOSIS — G3109 Other frontotemporal dementia: Secondary | ICD-10-CM | POA: Diagnosis not present

## 2020-01-17 DIAGNOSIS — G8191 Hemiplegia, unspecified affecting right dominant side: Secondary | ICD-10-CM | POA: Diagnosis not present

## 2021-07-19 DEATH — deceased

## 2021-11-25 IMAGING — MR MR HEAD W/O CM
10 of 11 series · 43 of 48 positions shown · non-contrast
Comparison: Recent CT imaging, MRI brain 8918

CLINICAL DATA: Left MCA stroke post thrombectomy

EXAM:
MRI HEAD WITHOUT CONTRAST
TECHNIQUE: Multiplanar, multiecho pulse sequences of the brain and surrounding
structures were obtained without intravenous contrast.

[Series 5: DWI · axial · 3.0mm · 0.88mm/px · z∈[-112,+32]mm · 10 of 100 slices shown (1 of 4)]
[im 1/100]
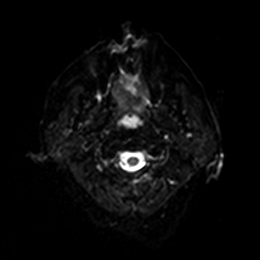
[im 12/100]
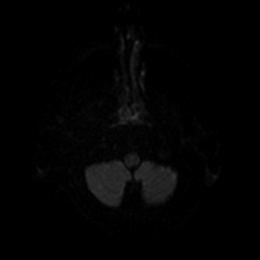
[im 23/100]
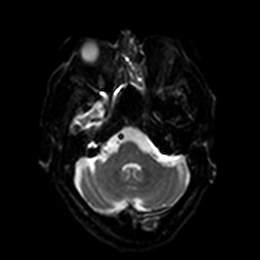
[im 34/100]
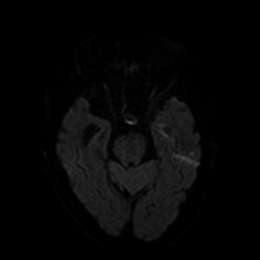
[im 45/100]
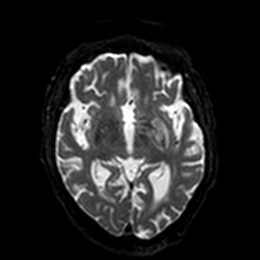
[im 56/100]
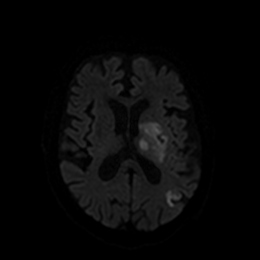
[im 67/100]
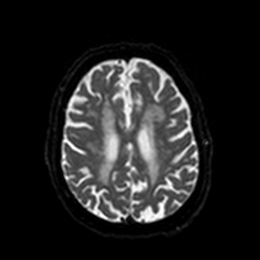
[im 78/100]
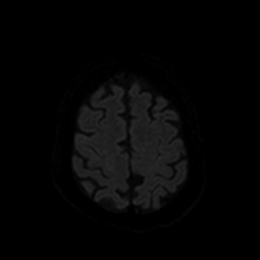
[im 89/100]
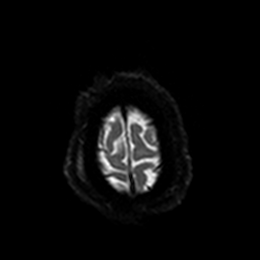
[im 100/100]
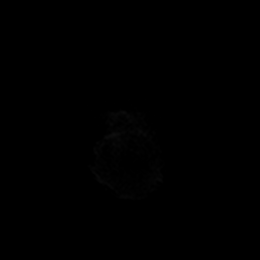

[Series 6: DWI · axial · 3.0mm · 0.88mm/px · z∈[-112,+32]mm · 5 of 50 slices shown (2 of 4)]
[im 1/50]
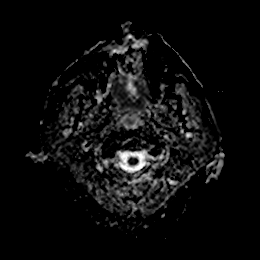
[im 13/50]
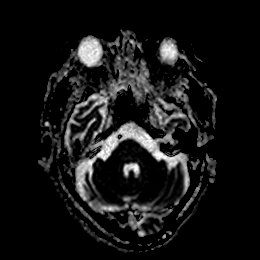
[im 25/50]
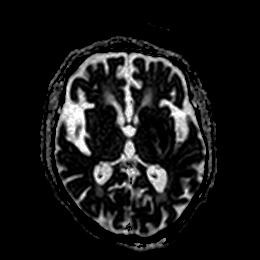
[im 37/50]
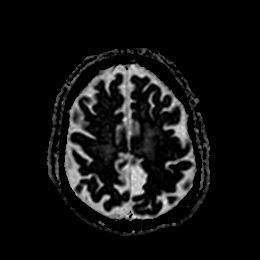
[im 50/50]
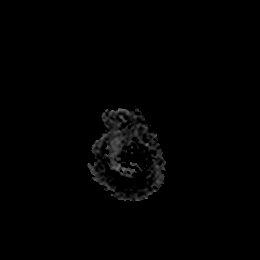

[Series 7: DWI · coronal · 4.0mm · 0.88mm/px · 6 of 68 slices shown (3 of 4)]
[im 1/68]
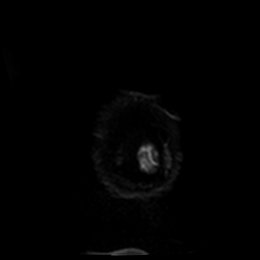
[im 14/68]
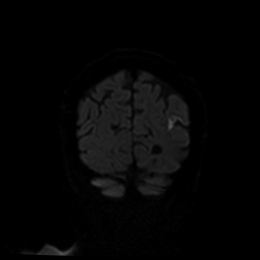
[im 27/68]
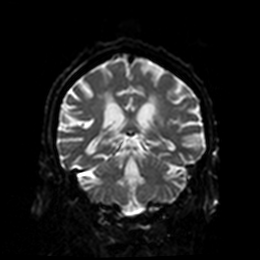
[im 41/68]
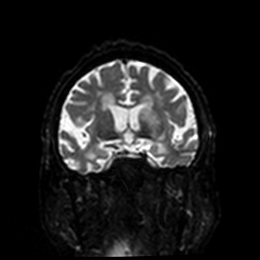
[im 54/68]
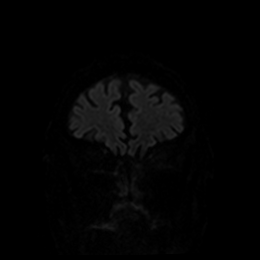
[im 68/68]
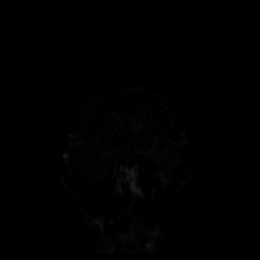

[Series 8: DWI · coronal · 4.0mm · 0.88mm/px · 3 of 34 slices shown (4 of 4)]
[im 1/34]
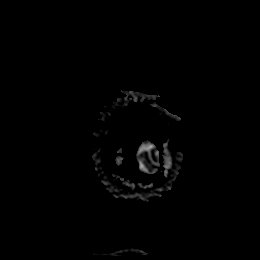
[im 17/34]
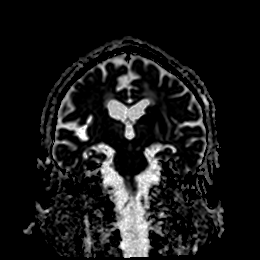
[im 34/34]
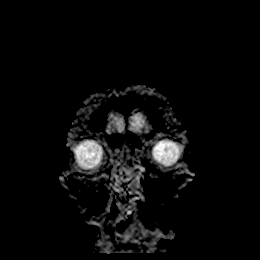

[Series 9: FLAIR · axial · 5.0mm · 0.45mm/px · z∈[-114,+33]mm · 2 of 26 slices shown]
[im 1/26]
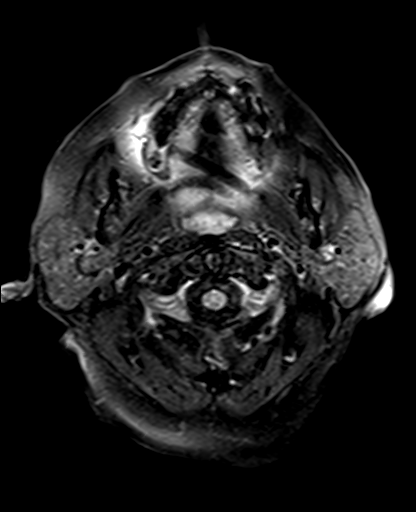
[im 26/26]
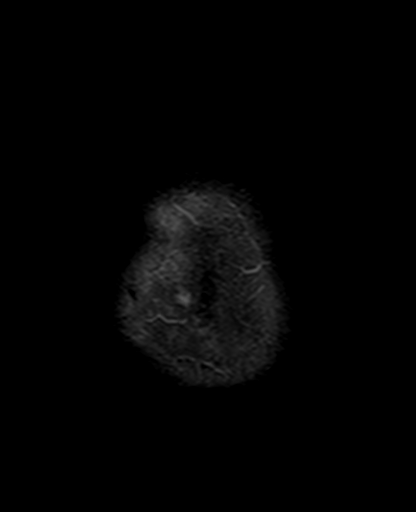

[Series 11: pha_images · axial · 3.0mm · 0.90mm/px · z∈[-117,+33]mm · 5 of 52 slices shown]
[im 1/52]
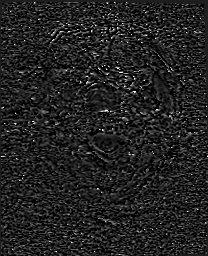
[im 13/52]
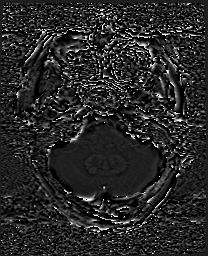
[im 26/52]
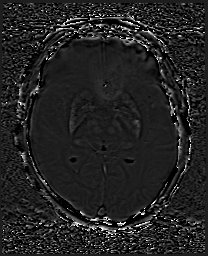
[im 39/52]
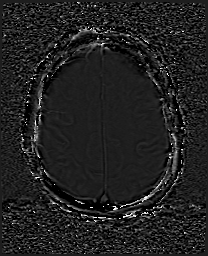
[im 52/52]
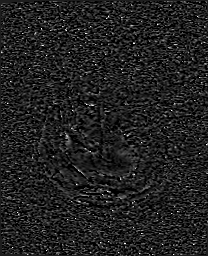

[Series 12: swi_images · axial · 3.0mm · 0.90mm/px · z∈[-117,+33]mm · 5 of 52 slices shown]
[im 1/52]
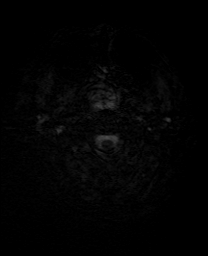
[im 13/52]
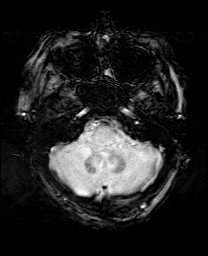
[im 26/52]
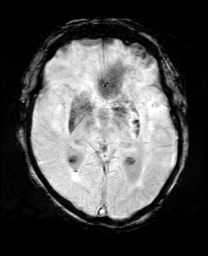
[im 39/52]
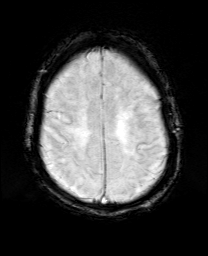
[im 52/52]
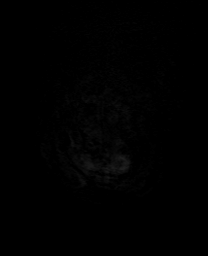

[Series 14: T1 · sagittal · 5.0mm · 0.75mm/px · 2 of 23 slices shown]
[im 1/23]
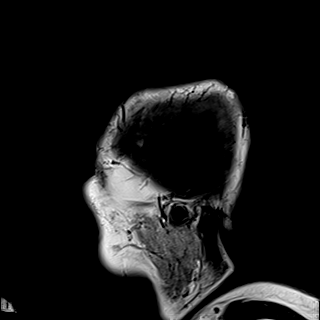
[im 23/23]
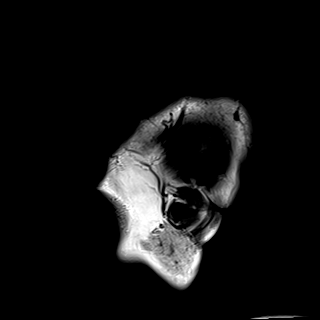

[Series 15: T2 · axial · 5.0mm · 0.72mm/px · z∈[-118,+30]mm · 2 of 26 slices shown (1 of 2)]
[im 1/26]
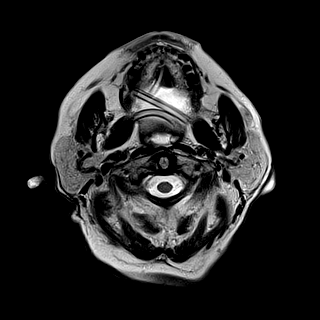
[im 26/26]
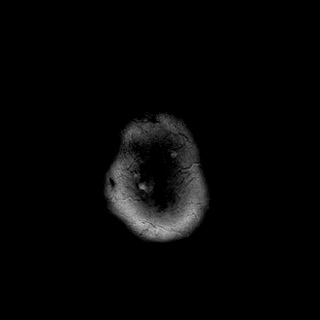

[Series 17: T2 · coronal · 5.0mm · 0.34mm/px · 3 of 29 slices shown (2 of 2)]
[im 1/29]
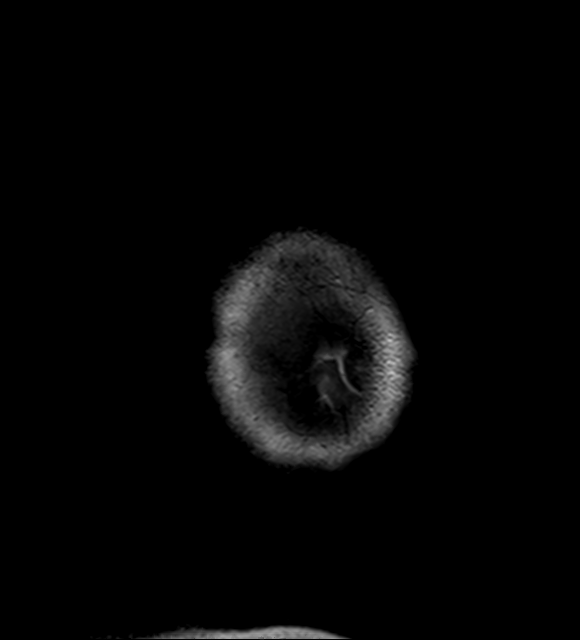
[im 15/29]
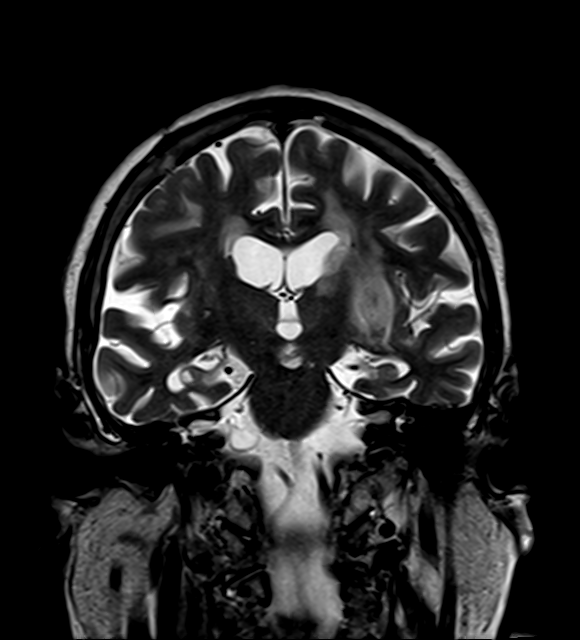
[im 29/29]
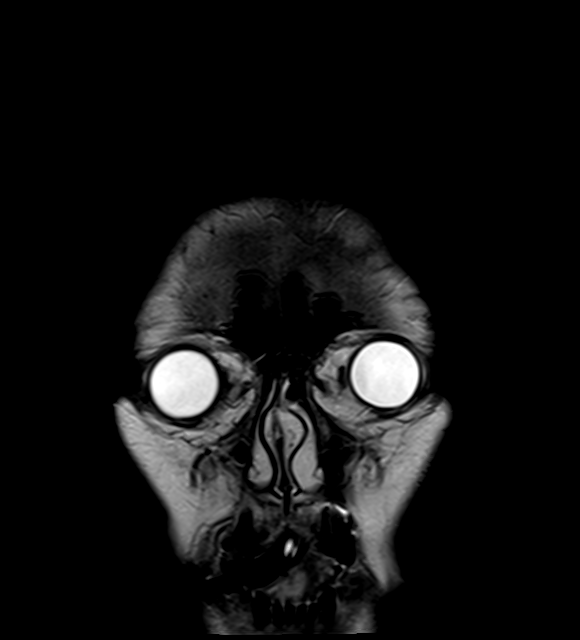

[43 of 48 positions shown; findings below may reference images not displayed]

FINDINGS: Brain: There is reduced diffusion involving the left basal ganglia
and adjacent white matter, a small area within the anteromedial left
temporal lobe, and small areas of the parietal and posterior left
temporal lobes. There is corresponding susceptibility primarily in
the basal ganglia region reflecting petechial reperfusion
hemorrhage.

Diffusion hyperintensity and susceptibility along the left MCA
cistern and sylvian fissure are compatible with subarachnoid
hemorrhage. There is trace layering intraventricular hemorrhage in
the occipital horns likely reflecting redistribution of the
subarachnoid hemorrhage.

Prominence of the ventricles and sulci reflects generalized
parenchymal volume loss. Confluent areas of T2 hyperintensity in the
supratentorial greater than pontine white matter are nonspecific but
probably reflect advanced chronic microvascular ischemic changes.
There is no significant mass effect.

Vascular: Major vessel flow voids at the skull base are preserved.

Skull and upper cervical spine: Normal marrow signal is preserved.

Sinuses/Orbits: Mild mucosal thickening. Bilateral lens
replacements.

Other: Sella is unremarkable.  Mastoid air cells are clear.
IMPRESSION: Acute infarcts of the left MCA territory involving the basal ganglia
and adjacent white matter and parietotemporal lobes. Petechial
reperfusion hemorrhage primarily involving the basal ganglia and
small volume subarachnoid hemorrhage as seen on CT.

Advanced chronic microvascular ischemic changes.

## 2021-12-11 IMAGING — CR DG CHEST 2V
2 series · 2 of 2 positions shown · non-contrast
Comparison: 09/06/2019.

CLINICAL DATA: Cough.

EXAM:
CHEST - 2 VIEW

[chest lat]
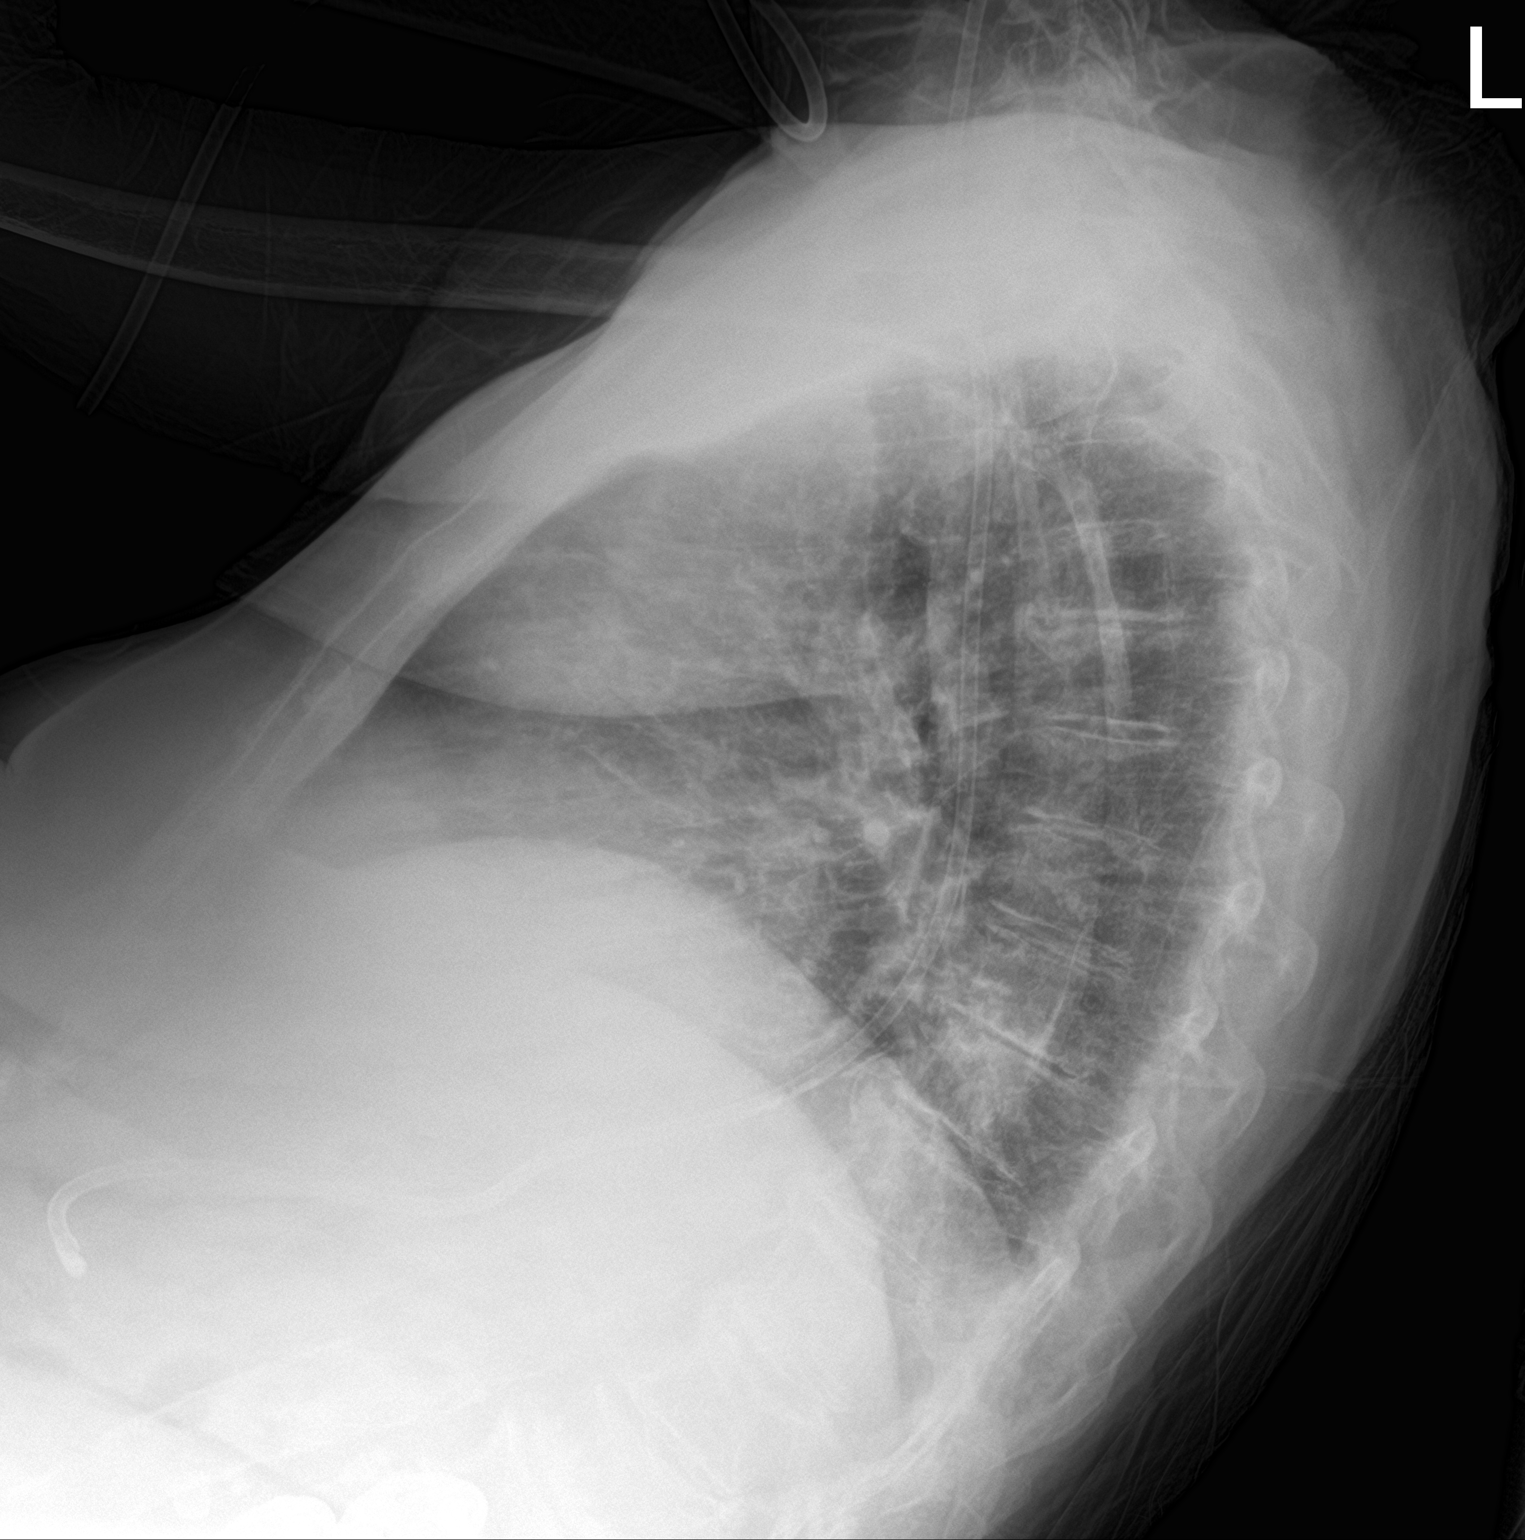

[chest ap]
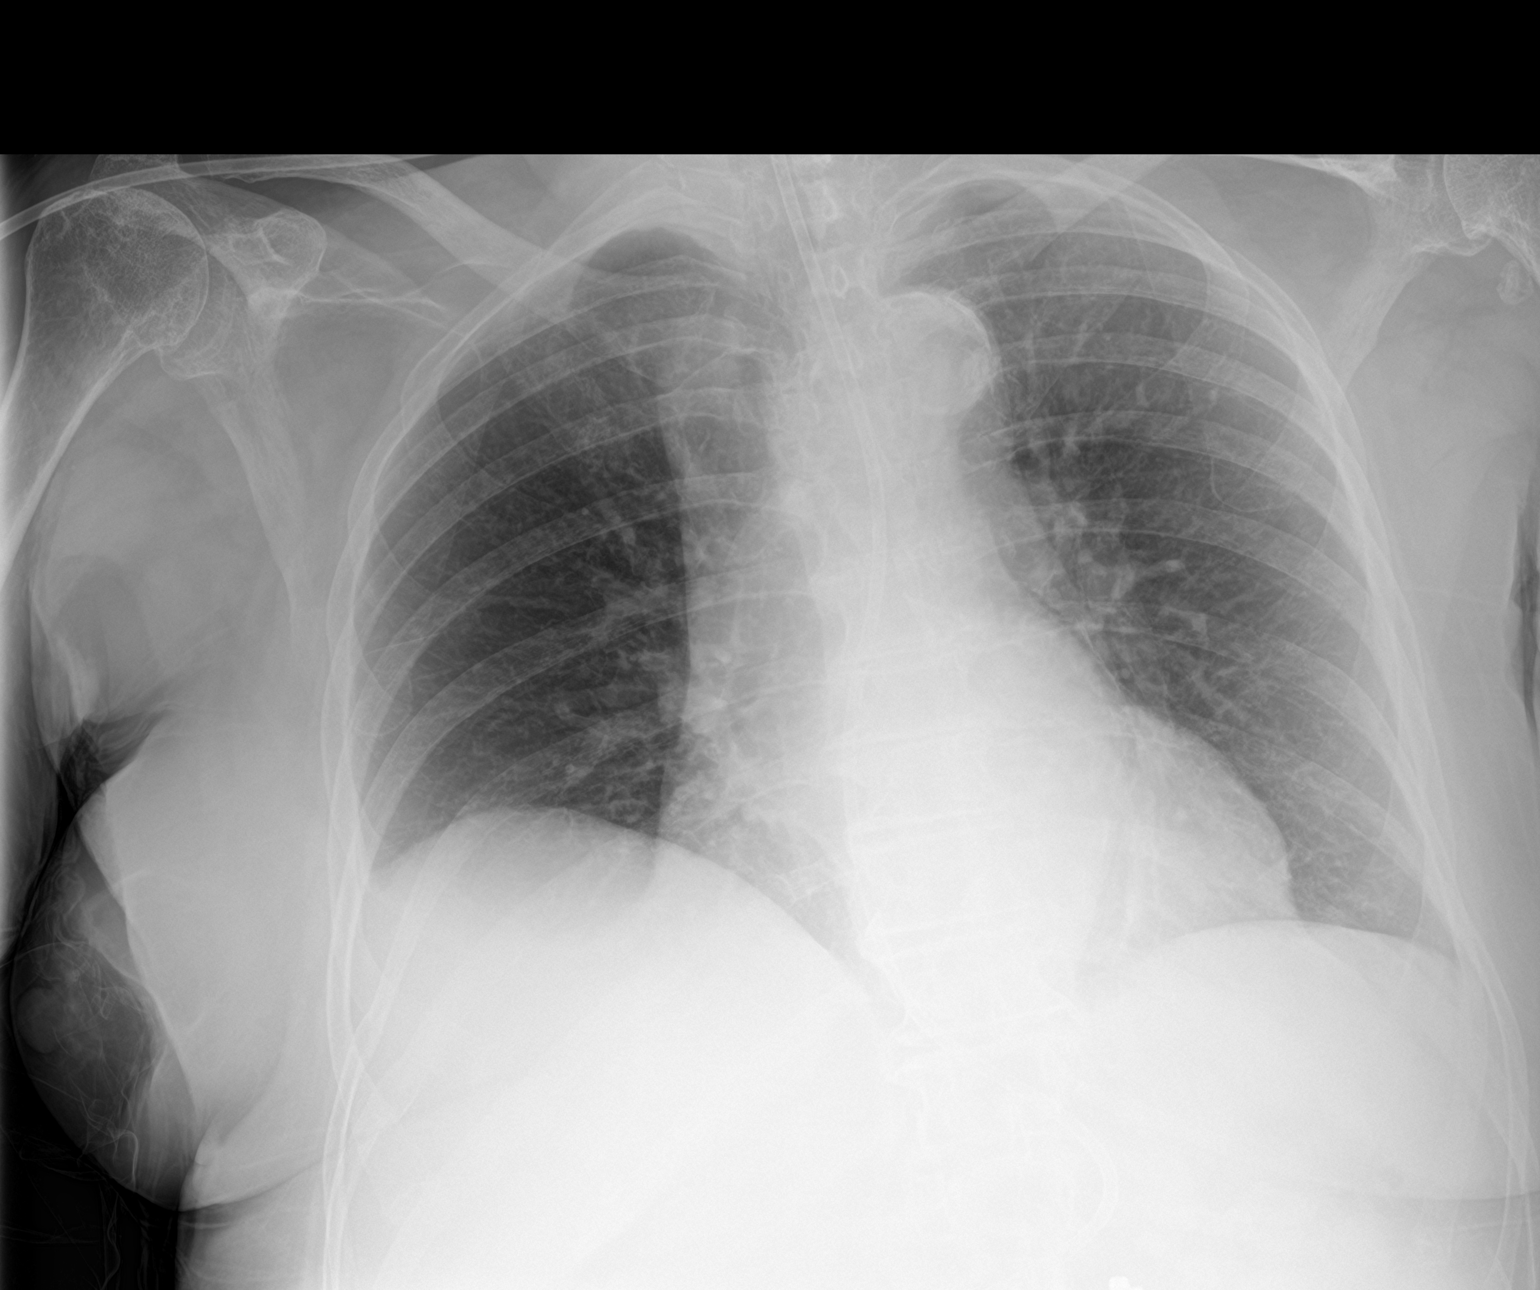

[2 of 2 positions shown; findings below may reference images not displayed]

FINDINGS: Feeding tube noted in stable position. Heart size stable. Low lung
volumes. Mild bibasilar subsegmental atelectasis. Right base
infiltrate cannot be excluded. Tiny right pleural effusion. No
pneumothorax. Degenerative changes thoracic spine.
IMPRESSION: 1.  Feeding tube noted in stable position.

2. Low lung volumes with mild bibasilar subsegmental atelectasis.
Mild right base infiltrate cannot be excluded.
